# Patient Record
Sex: Female | Born: 1935 | Race: White | Hispanic: No | Smoking: Never smoker
Health system: Southern US, Community
[De-identification: ages and names within clinical notes are randomized; demographics above are authoritative.]

## PROBLEM LIST (undated history)

## (undated) DIAGNOSIS — C439 Malignant melanoma of skin, unspecified: Secondary | ICD-10-CM

## (undated) DIAGNOSIS — E876 Hypokalemia: Secondary | ICD-10-CM

## (undated) DIAGNOSIS — K82A1 Gangrene of gallbladder in cholecystitis: Secondary | ICD-10-CM

## (undated) DIAGNOSIS — M1990 Unspecified osteoarthritis, unspecified site: Secondary | ICD-10-CM

## (undated) DIAGNOSIS — S81802D Unspecified open wound, left lower leg, subsequent encounter: Secondary | ICD-10-CM

## (undated) DIAGNOSIS — K222 Esophageal obstruction: Secondary | ICD-10-CM

## (undated) DIAGNOSIS — M810 Age-related osteoporosis without current pathological fracture: Secondary | ICD-10-CM

## (undated) DIAGNOSIS — K5792 Diverticulitis of intestine, part unspecified, without perforation or abscess without bleeding: Secondary | ICD-10-CM

## (undated) DIAGNOSIS — E785 Hyperlipidemia, unspecified: Secondary | ICD-10-CM

## (undated) DIAGNOSIS — K219 Gastro-esophageal reflux disease without esophagitis: Secondary | ICD-10-CM

## (undated) DIAGNOSIS — E871 Hypo-osmolality and hyponatremia: Secondary | ICD-10-CM

## (undated) DIAGNOSIS — Z66 Do not resuscitate: Secondary | ICD-10-CM

## (undated) DIAGNOSIS — Z974 Presence of external hearing-aid: Secondary | ICD-10-CM

## (undated) DIAGNOSIS — D229 Melanocytic nevi, unspecified: Secondary | ICD-10-CM

## (undated) DIAGNOSIS — J302 Other seasonal allergic rhinitis: Secondary | ICD-10-CM

## (undated) DIAGNOSIS — I10 Essential (primary) hypertension: Secondary | ICD-10-CM

## (undated) HISTORY — DX: Presence of external hearing-aid: Z97.4

## (undated) HISTORY — DX: Esophageal obstruction: K22.2

## (undated) HISTORY — DX: Hypomagnesemia: E83.42

## (undated) HISTORY — DX: Melanocytic nevi, unspecified: D22.9

## (undated) HISTORY — DX: Unspecified open wound, left lower leg, subsequent encounter: S81.802D

## (undated) HISTORY — DX: Hypo-osmolality and hyponatremia: E87.1

## (undated) HISTORY — DX: Malignant melanoma of skin, unspecified: C43.9

## (undated) HISTORY — DX: Other seasonal allergic rhinitis: J30.2

## (undated) HISTORY — DX: Diverticulitis of intestine, part unspecified, without perforation or abscess without bleeding: K57.92

## (undated) HISTORY — DX: Unspecified osteoarthritis, unspecified site: M19.90

## (undated) HISTORY — DX: Gastro-esophageal reflux disease without esophagitis: K21.9

## (undated) HISTORY — DX: Essential (primary) hypertension: I10

## (undated) HISTORY — DX: Age-related osteoporosis without current pathological fracture: M81.0

## (undated) HISTORY — PX: CATARACT EXTRACTION: SUR2

## (undated) HISTORY — DX: Gangrene of gallbladder in cholecystitis: K82.A1

## (undated) HISTORY — DX: Do not resuscitate: Z66

## (undated) HISTORY — DX: Hyperlipidemia, unspecified: E78.5

## (undated) HISTORY — DX: Hypokalemia: E87.6

---
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
## 1972-05-24 HISTORY — PX: CARPAL TUNNEL RELEASE: SHX101

## 1997-10-14 ENCOUNTER — Other Ambulatory Visit: Admission: RE | Admit: 1997-10-14 | Discharge: 1997-10-14 | Payer: Self-pay | Admitting: Internal Medicine

## 1998-11-20 ENCOUNTER — Other Ambulatory Visit: Admission: RE | Admit: 1998-11-20 | Discharge: 1998-11-20 | Payer: Self-pay | Admitting: *Deleted

## 1999-05-25 HISTORY — PX: BUNIONECTOMY: SHX129

## 1999-11-17 ENCOUNTER — Encounter: Payer: Self-pay | Admitting: Family Medicine

## 1999-11-17 ENCOUNTER — Encounter: Admission: RE | Admit: 1999-11-17 | Discharge: 1999-11-17 | Payer: Self-pay | Admitting: Family Medicine

## 2000-05-09 ENCOUNTER — Other Ambulatory Visit: Admission: RE | Admit: 2000-05-09 | Discharge: 2000-05-09 | Payer: Self-pay | Admitting: Podiatry

## 2000-08-15 ENCOUNTER — Other Ambulatory Visit: Admission: RE | Admit: 2000-08-15 | Discharge: 2000-08-15 | Payer: Self-pay | Admitting: Internal Medicine

## 2000-09-29 ENCOUNTER — Other Ambulatory Visit: Admission: RE | Admit: 2000-09-29 | Discharge: 2000-09-29 | Payer: Self-pay | Admitting: Gynecology

## 2000-09-29 ENCOUNTER — Encounter (INDEPENDENT_AMBULATORY_CARE_PROVIDER_SITE_OTHER): Payer: Self-pay | Admitting: Specialist

## 2000-11-28 ENCOUNTER — Encounter: Payer: Self-pay | Admitting: Internal Medicine

## 2000-11-28 ENCOUNTER — Encounter: Admission: RE | Admit: 2000-11-28 | Discharge: 2000-11-28 | Payer: Self-pay | Admitting: Internal Medicine

## 2001-06-16 ENCOUNTER — Encounter: Admission: RE | Admit: 2001-06-16 | Discharge: 2001-06-16 | Payer: Self-pay | Admitting: Gynecology

## 2001-06-16 ENCOUNTER — Encounter: Payer: Self-pay | Admitting: Gynecology

## 2001-08-04 ENCOUNTER — Encounter: Payer: Self-pay | Admitting: Gastroenterology

## 2001-12-19 ENCOUNTER — Encounter: Admission: RE | Admit: 2001-12-19 | Discharge: 2001-12-19 | Payer: Self-pay | Admitting: Internal Medicine

## 2001-12-19 ENCOUNTER — Encounter: Payer: Self-pay | Admitting: Internal Medicine

## 2001-12-20 ENCOUNTER — Encounter: Payer: Self-pay | Admitting: Internal Medicine

## 2001-12-20 ENCOUNTER — Encounter: Admission: RE | Admit: 2001-12-20 | Discharge: 2001-12-20 | Payer: Self-pay | Admitting: Internal Medicine

## 2002-09-03 ENCOUNTER — Other Ambulatory Visit: Admission: RE | Admit: 2002-09-03 | Discharge: 2002-09-03 | Payer: Self-pay | Admitting: Gynecology

## 2002-12-24 ENCOUNTER — Encounter: Admission: RE | Admit: 2002-12-24 | Discharge: 2002-12-24 | Payer: Self-pay | Admitting: Internal Medicine

## 2002-12-24 ENCOUNTER — Encounter: Payer: Self-pay | Admitting: Internal Medicine

## 2003-12-24 ENCOUNTER — Encounter: Admission: RE | Admit: 2003-12-24 | Discharge: 2003-12-24 | Payer: Self-pay | Admitting: Internal Medicine

## 2005-01-12 ENCOUNTER — Encounter: Admission: RE | Admit: 2005-01-12 | Discharge: 2005-01-12 | Payer: Self-pay | Admitting: Specialist

## 2005-05-27 ENCOUNTER — Other Ambulatory Visit: Admission: RE | Admit: 2005-05-27 | Discharge: 2005-05-27 | Payer: Self-pay | Admitting: Gynecology

## 2006-01-14 ENCOUNTER — Encounter: Admission: RE | Admit: 2006-01-14 | Discharge: 2006-01-14 | Payer: Self-pay | Admitting: Specialist

## 2007-01-16 ENCOUNTER — Encounter: Admission: RE | Admit: 2007-01-16 | Discharge: 2007-01-16 | Payer: Self-pay | Admitting: Specialist

## 2007-04-24 ENCOUNTER — Ambulatory Visit (HOSPITAL_COMMUNITY): Admission: RE | Admit: 2007-04-24 | Discharge: 2007-04-25 | Payer: Self-pay | Admitting: Orthopedic Surgery

## 2008-01-17 ENCOUNTER — Encounter: Admission: RE | Admit: 2008-01-17 | Discharge: 2008-01-17 | Payer: Self-pay | Admitting: Specialist

## 2008-10-28 ENCOUNTER — Encounter: Payer: Self-pay | Admitting: Gastroenterology

## 2008-11-20 DIAGNOSIS — I10 Essential (primary) hypertension: Secondary | ICD-10-CM | POA: Insufficient documentation

## 2008-11-20 DIAGNOSIS — M8580 Other specified disorders of bone density and structure, unspecified site: Secondary | ICD-10-CM | POA: Insufficient documentation

## 2008-11-20 DIAGNOSIS — K5730 Diverticulosis of large intestine without perforation or abscess without bleeding: Secondary | ICD-10-CM | POA: Insufficient documentation

## 2008-11-20 DIAGNOSIS — R1310 Dysphagia, unspecified: Secondary | ICD-10-CM | POA: Insufficient documentation

## 2008-11-20 DIAGNOSIS — E7800 Pure hypercholesterolemia, unspecified: Secondary | ICD-10-CM | POA: Insufficient documentation

## 2008-11-26 ENCOUNTER — Ambulatory Visit: Payer: Self-pay | Admitting: Gastroenterology

## 2008-11-26 DIAGNOSIS — M1710 Unilateral primary osteoarthritis, unspecified knee: Secondary | ICD-10-CM | POA: Insufficient documentation

## 2008-11-26 DIAGNOSIS — M129 Arthropathy, unspecified: Secondary | ICD-10-CM | POA: Insufficient documentation

## 2008-12-11 ENCOUNTER — Ambulatory Visit: Payer: Self-pay | Admitting: Gastroenterology

## 2008-12-17 ENCOUNTER — Encounter: Payer: Self-pay | Admitting: Gastroenterology

## 2008-12-17 ENCOUNTER — Ambulatory Visit: Payer: Self-pay | Admitting: Gastroenterology

## 2008-12-18 ENCOUNTER — Telehealth: Payer: Self-pay | Admitting: Gastroenterology

## 2008-12-19 ENCOUNTER — Telehealth: Payer: Self-pay | Admitting: Gastroenterology

## 2008-12-19 ENCOUNTER — Encounter: Payer: Self-pay | Admitting: Gastroenterology

## 2008-12-19 DIAGNOSIS — K222 Esophageal obstruction: Secondary | ICD-10-CM | POA: Insufficient documentation

## 2008-12-30 ENCOUNTER — Telehealth: Payer: Self-pay | Admitting: Gastroenterology

## 2009-01-17 ENCOUNTER — Encounter: Admission: RE | Admit: 2009-01-17 | Discharge: 2009-01-17 | Payer: Self-pay | Admitting: Specialist

## 2009-01-20 ENCOUNTER — Telehealth: Payer: Self-pay | Admitting: Gastroenterology

## 2009-01-21 ENCOUNTER — Ambulatory Visit: Payer: Self-pay | Admitting: Gastroenterology

## 2009-01-21 ENCOUNTER — Encounter: Payer: Self-pay | Admitting: Gastroenterology

## 2009-01-21 ENCOUNTER — Ambulatory Visit (HOSPITAL_COMMUNITY): Admission: RE | Admit: 2009-01-21 | Discharge: 2009-01-21 | Payer: Self-pay | Admitting: Gastroenterology

## 2009-01-22 ENCOUNTER — Encounter: Payer: Self-pay | Admitting: Gastroenterology

## 2009-01-22 ENCOUNTER — Telehealth: Payer: Self-pay | Admitting: Gastroenterology

## 2009-02-06 ENCOUNTER — Ambulatory Visit (HOSPITAL_COMMUNITY): Admission: RE | Admit: 2009-02-06 | Discharge: 2009-02-06 | Payer: Self-pay | Admitting: Gastroenterology

## 2009-02-06 ENCOUNTER — Encounter: Payer: Self-pay | Admitting: Gastroenterology

## 2009-03-17 ENCOUNTER — Telehealth: Payer: Self-pay | Admitting: Gastroenterology

## 2009-06-11 ENCOUNTER — Encounter: Payer: Self-pay | Admitting: Gastroenterology

## 2009-06-18 ENCOUNTER — Encounter: Payer: Self-pay | Admitting: Gastroenterology

## 2009-06-19 ENCOUNTER — Telehealth: Payer: Self-pay | Admitting: Gastroenterology

## 2009-07-15 ENCOUNTER — Ambulatory Visit: Payer: Self-pay | Admitting: Gastroenterology

## 2009-07-15 DIAGNOSIS — K219 Gastro-esophageal reflux disease without esophagitis: Secondary | ICD-10-CM | POA: Insufficient documentation

## 2009-09-11 ENCOUNTER — Telehealth: Payer: Self-pay | Admitting: Gastroenterology

## 2010-01-19 ENCOUNTER — Encounter: Admission: RE | Admit: 2010-01-19 | Discharge: 2010-01-19 | Payer: Self-pay | Admitting: Specialist

## 2010-05-24 DIAGNOSIS — K222 Esophageal obstruction: Secondary | ICD-10-CM

## 2010-05-24 HISTORY — DX: Esophageal obstruction: K22.2

## 2010-06-25 NOTE — Progress Notes (Signed)
Summary: Pantoprazole prior authorization  Phone Note Call from Patient Call back at Home Phone [phone]   Caller: Patient Call For: Arlyce Dice Reason for Call: Talk to Nurse Summary of Call: Patient needs a prior auth for her Pantaprazole please call pharmacy at [phone] [facility] in [place] Initial call taken by: Tawni Levy,  December 18, 2008 10:48 AM  Follow-up for Phone Call        Called pt to inform just recieved the prior auth form. Explained to her it will take a couple of days for the insurance co to get back to [place]. She stated she has been on all OTC omeprazole and also Nexium she cant take because it makes her more sick. Follow-up by: Merri Ray CMA (AAMA),  December 18, 2008 1:32 PM  Additional Follow-up for Phone Call Additional follow up Details #1::        Spoke with [employer], they approved pts pantoprazole from today with no expiration date. Called pt and [facility] pharmacy to inform. Pts co-pay for that med is 22.00. pt informed Additional Follow-up by: Merri Ray CMA Duncan Dull),  December 19, 2008 9:56 AM

## 2010-06-25 NOTE — Progress Notes (Signed)
Summary: What is the name of medicine ordered for her?  Phone Note Call from Patient Call back at Home Phone [phone]   Call For: Dr Arlyce Dice Reason for Call: Talk to Nurse Summary of Call: [facility] has her medicine waiting for her and wants to know what is is? Is it Nexium? She cant take NExium is really sensitive to drugs. Wants  to know if she can just continue with the tums instead? Initial call taken by: [facility],  December 30, 2008 11:20 AM  Follow-up for Phone Call        Explained to pt that we sent her in Protonix and her insurance approved it. She stated she would try the med but prefers to continue  to take Tums on a daily basis. She stated she would try the Protonix if she didnt like it she would call us back to inform Follow-up by: Merri Ray CMA (AAMA),  December 30, 2008 1:12 PM

## 2010-06-25 NOTE — Procedures (Signed)
Summary: EGD   EGD  Procedure date:  12/17/2008  Findings:      Location: [facility]   ENDOSCOPY PROCEDURE REPORT  PATIENT:  Teresa Meyer, Teresa Meyer  MR#:  [id number] BIRTHDATE:   11-18-1922, 86 yrs. old   GENDER:   female  ENDOSCOPIST:   Barbette Hair. Arlyce Dice, MD Referred by:   PROCEDURE DATE:  12/17/2008 PROCEDURE:  EGD with biopsy, Maloney Dilation of Esophagus ASA CLASS:   Class II INDICATIONS: dysphagia   MEDICATIONS:    Fentanyl 50 mcg IV, Versed 6 mg IV, glycopyrrolate (Robinal) 0.2 mg IV, 0.6cc simethancone 0.6 cc PO TOPICAL ANESTHETIC:    DESCRIPTION OF PROCEDURE:   After the risks benefits and alternatives of the procedure were thoroughly explained, informed consent was obtained.  [facility] [id number] endoscope was introduced through the mouth and advanced to the third portion of the duodenum, without limitations.  The instrument was slowly withdrawn as the mucosa was fully examined. <<PROCEDUREIMAGES>>      <<OLD IMAGES>>  A stricture was found at the gastroesophageal junction. Moderately tight stricture offering resistance to the 10mm scope (see image1). Passage caused minor bleeding 12-46mm; moderate resistance  Mild gastritis was found in the antrum. ss With standard forceps, a biopsy was obtained and sent to pathology (see image3).  Duodenitis was found (see image2).  Esophagitis was found in the total esophagus (see image5). Moderate erythema    Retroflexed views revealed no abnormalities.    The scope was then withdrawn from the patient and the procedure completed.  COMPLICATIONS:   None  ENDOSCOPIC IMPRESSION:  1) Stricture at the gastroesophageal junction - s/p dilitation  2) Mild gastritis in the antrum  3) Duodenitis  4) Esophagitis in the total esophagus RECOMMENDATIONS:  1) Protonix 40 mg qd  2) balloon dilatation  in 3 weeks  REPEAT EXAM:   In 3 weeks  for EGD with balloon  dilatation.    cc: Dr. Casimiro Needle Blocker    _______________________________ Barbette Hair. Arlyce Dice, MD    CC:     REPORT OF SURGICAL PATHOLOGY   Case #: [id number] Patient Name: Teresa Meyer, Teresa Meyer. Office Chart Number:  [id number]   MRN: [id number] Pathologist: Alden Server A. Delila Spence, MD DOB/Age  Oct 12, 1922 (Age: 38)    Gender: F Date Taken:  12/17/2008 Date Received: 12/18/2008   FINAL DIAGNOSIS   ***MICROSCOPIC EXAMINATION AND DIAGNOSIS***   STOMACH:  BENIGN GASTRIC MUCOSA.  NO HELICOBACTER PYLORI, INTESTINAL METAPLASIA OR ACTIVE INFLAMMATION IDENTIFIED.   COMMENT A Warthin-Starry stain is performed to determine the possibility of the presence of Helicobacter pylori. The Warthin-Starry stain is negative for organisms of Helicobacter pylori. The control(s) stained appropriately.(EAA:mj 12/19/08)   mj Date Reported:  12/19/2008     Lyn Hollingshead. Delila Spence, MD *** Electronically Signed Out By EAA ***    December 19, 2008 MRN: [id number]    [facility] [address] [place], [place]  [address]    Dear Ms. Cannan,   Your biopsies demonstrated inflammatory changes only.    Please follow the recommendations previously discussed.  Should you have any immediate concerns or questions, feel free to contact me at the office.    Sincerely,  Barbette Hair. Arlyce Dice, M.D., [facility]          Sincerely,  Louis Meckel MD  This letter has been electronically signed by your physician.   Signed by Louis Meckel MD on 12/19/2008 at 12:52 PM  This report was created from the original endoscopy report, which was reviewed and  signed by the above listed endoscopist.

## 2010-06-25 NOTE — Progress Notes (Signed)
Summary: Switch to Nexium  ---- Converted from flag ---- ---- 06/18/2009 5:07 PM, Louis Meckel MD wrote: Please call pt and tell her I've called in nexium, which she should try once daily provided that it doesn't cause diarrhea.  She can try reducing dose to every other day or q3days if sxs remain under control.  She should return for an ov ------------------------------  Phone Note Outgoing Call   Call placed by: Laureen Ochs LPN,  June 19, 2009 9:12 AM Call placed to: Patient Summary of Call: Above MD orders reviewed with patient. OV scheduled for 07-15-09 at 2pm. Pt. instructed to call back as needed.  Initial call taken by: Laureen Ochs LPN,  June 19, 2009 9:18 AM

## 2010-06-25 NOTE — Miscellaneous (Signed)
  Clinical Lists Changes  Medications: Removed medication of PROTONIX 40 MG  TBEC (PANTOPRAZOLE SODIUM) 1 each day 30 minutes before meal Added new medication of NEXIUM 40 MG  CPDR (ESOMEPRAZOLE MAGNESIUM) 1 capsule each day 30 minutes before meal - Signed Rx of NEXIUM 40 MG  CPDR (ESOMEPRAZOLE MAGNESIUM) 1 capsule each day 30 minutes before meal;  #15 x 5;  Signed;  Entered by: Louis Meckel MD;  Authorized by: Louis Meckel MD;  Method used: Electronically to [facility] [phone]*, [address], [place], [place]  [id number], Ph: [phone], Fax: [phone]    Prescriptions: NEXIUM 40 MG  CPDR (ESOMEPRAZOLE MAGNESIUM) 1 capsule each day 30 minutes before meal  #15 x 5   Entered and Authorized by:   Louis Meckel MD   Signed by:   Louis Meckel MD on 06/18/2009   Method used:   Electronically to        [facility]. [address] [phone]* (retail)       [address] [place], [place]  [id number]       Ph: [phone]       Fax: [phone]   RxID:   [id number]

## 2010-06-25 NOTE — Procedures (Signed)
Summary: Colonoscopy   Colonoscopy  Procedure date:  08/04/2001  Findings:      Results: Diverticulosis.       Location:  [facility].   Patient Name: Teresa Meyer, Teresa Meyer MRN:  Procedure Procedures: Colorectal cancer screening, average risk CPT: G0121.  Personnel: Endoscopist: Barbette Hair. Arlyce Dice, MD.  Referred By: Marisue Brooklyn, D.O.  Indications  Average Risk Screening Routine.  History  Pre-Exam Physical: Performed Aug 04, 2001. Entire physical exam was normal.  Exam Exam: Extent of exam reached: Cecum, extent intended: Cecum.  The cecum was identified by IC valve. Colon retroflexion performed. ASA Classification: II. Tolerance: good.  Monitoring: Pulse and BP monitoring, Oximetry used. Supplemental O2 given. at 2 Liters.  Colon Prep Used Golytely for colon prep. Prep results: good.  Sedation Meds: Fentanyl 100 mcg. given IV. Versed 7 mg. given IV.  Findings DIVERTICULOSIS: Sigmoid Colon. ICD9: Diverticulosis: 562.10.  DIVERTICULOSIS: Sigmoid Colon. ICD9: Diverticulosis: 562.10.  DIVERTICULOSIS: Sigmoid Colon. ICD9: Diverticulosis: 562.10.   Assessment Abnormal examination, see findings above.  Diagnoses: 562.10: Diverticulosis.   Events  Unplanned Interventions: No intervention was required.  Unplanned Events: There were no complications. Plans Patient Education: Patient given standard instructions for: a normal exam.  Scheduling/Referral: Follow-Up prn.  This report was created from the original endoscopy report, which was reviewed and signed by the above listed endoscopist.    cc. Amy Stevenson,MD

## 2010-06-25 NOTE — Progress Notes (Signed)
Summary: TRIAGE  Phone Note Call from Patient   Caller: Patient Call For: Dr.Giovoni Bunch Summary of Call: Pt. picked-up her script, states she won't take Nexium because it causes her to have stomache cramps. Wants Dr.Brynnan Rodenbaugh to prescribe something else. [facility] PLEASE ADVISE  Initial call taken by: Laureen Ochs LPN,  June 19, 2009 3:11 PM  Follow-up for Phone Call        Per Dr.Annell Canty--Trial pt. on a few PPI's, she is to callback with an update on which one works best, then she can get a script for that one.  Above MD orders reviewed with patient. She will come for samples of Achipex, Prevacid OTC and Dexilant. Pt. instructed to call back as needed.  Follow-up by: Laureen Ochs LPN,  June 20, 2009 9:40 AM  Additional Follow-up for Phone Call Additional follow up Details #1::            /

## 2010-06-25 NOTE — Assessment & Plan Note (Signed)
Summary: F/U--SWITCHED FROM PROTONIX TO NEXIUM           Teresa Meyer   History of Present Illness Visit Type: Follow-up Visit Primary GI MD: Melvia Heaps MD [facility] Primary Provider: Orson Aloe, MD Requesting Provider: n/a Chief Complaint: F/u from starting prevacid. Pt said that she never took nexium and stopped taking prevacid  History of Present Illness:   [name] has returned for followup of her esophageal stricture.  She has undergone sequential dilatation of a distal esophageal stricture.  She has no further dysphagia.  She has occasional postprandial pyrosis for which he takes [place].  PPIs generally  cause some GI upset.   GI Review of Systems      Denies abdominal pain, acid reflux, belching, bloating, chest pain, dysphagia with liquids, dysphagia with solids, heartburn, loss of appetite, nausea, vomiting, vomiting blood, weight loss, and  weight gain.        Denies anal fissure, black tarry stools, change in bowel habit, constipation, diarrhea, diverticulosis, fecal incontinence, heme positive stool, hemorrhoids, irritable bowel syndrome, jaundice, light color stool, liver problems, rectal bleeding, and  rectal pain.    Current Medications (verified): 1)  Actonel 35 Mg Tabs (Risedronate Sodium) .Marland Kitchen.. 1 By Mouth Once Weekly 2)  Hydrochlorothiazide 25 Mg Tabs (Hydrochlorothiazide) .... 1/2 By Mouth Once Daily 3)  Vitamin B-12 1000 Mcg Tabs (Cyanocobalamin) .Marland Kitchen.. 1 By Mouth Once Daily 4)  Aspirin 81 Mg Tbec (Aspirin) .Marland Kitchen.. 1 By Mouth Twice A Week  Allergies (verified): 1)  ! Novocain 2)  ! Codeine  Past History:  Past Medical History: Reviewed history from 11/26/2008 and no changes required. OTHER DYSPHAGIA (ICD-787.29) OSTEOPENIA (ICD-733.90) HYPERCHOLESTEROLEMIA (ICD-272.0) ESSENTIAL HYPERTENSION (ICD-401.9) DIVERTICULOSIS, COLON (ICD-562.10) Arthritis  Past Surgical History: Reviewed history from 11/26/2008 and no changes required. D&C x2 for Vaginal  bleeding Laminectomy Hammer Toe Surgery  Family History: Reviewed history from 11/26/2008 and no changes required. No FH of Colon Cancer: Family History of Pancreatic Cancer: Son Family History of Lung Cancer: Mother  Social History: Reviewed history from 11/26/2008 and no changes required. Widowed, 1 boy, 1 girl Retired Patient has never smoked.  Alcohol Use - yes 1.5 glasses wine Daily Caffeine Use 1 glass tea Illicit Drug Use - no  Review of Systems       The patient complains of arthritis/joint pain.  The patient denies allergy/sinus, anemia, anxiety-new, back pain, blood in urine, breast changes/lumps, change in vision, confusion, cough, coughing up blood, depression-new, fainting, fatigue, fever, headaches-new, hearing problems, heart murmur, heart rhythm changes, itching, menstrual pain, muscle pains/cramps, night sweats, nosebleeds, pregnancy symptoms, shortness of breath, skin rash, sleeping problems, sore throat, swelling of feet/legs, swollen lymph glands, thirst - excessive , urination - excessive , urination changes/pain, urine leakage, vision changes, and voice change.    Vital Signs:  Patient profile:   77 year old female Height:      62 inches Weight:      139 pounds BSA:     1.64 Pulse rate:   72 / minute Pulse rhythm:   regular BP sitting:   126 / 82  (left arm) Cuff size:   regular  Vitals Entered By: Ok Anis CMA (July 15, 2009 2:01 PM)   Impression & Recommendations:  Problem # 1:  ESOPHAGEAL STRICTURE (ICD-530.3) Assessment Improved Repeat dilatations p.r.n.  Problem # 2:  GERD (ICD-530.81) Symptoms are well controlled with p.r.n. antacids.  She does not tolerate PPI therapy that well.She was instructed to use Prevacid as needed if  her pyrosis gets worse.  Patient Instructions: 1)  CC Dr. Casimiro Needle Blocker

## 2010-06-25 NOTE — Progress Notes (Signed)
Summary: rx mailed  Phone Note Call from Patient Call back at Home Phone [phone]   Caller: Patient Call For: Arlyce Dice Reason for Call: Talk to Nurse Summary of Call: Patient was given samples of Aciphex and they worked, would like an rx mailed to her. Initial call taken by: Tawni Levy,  September 11, 2009 11:43 AM  Follow-up for Phone Call        Will mail today Follow-up by: Merri Ray CMA Duncan Dull),  September 11, 2009 1:44 PM    New/Updated Medications: ACIPHEX 20 MG TBEC (RABEPRAZOLE SODIUM) 1 by mouth once daily Prescriptions: ACIPHEX 20 MG TBEC (RABEPRAZOLE SODIUM) 1 by mouth once daily  #90 x 3   Entered by:   Merri Ray CMA (AAMA)   Authorized by:   Louis Meckel MD   Signed by:   Merri Ray CMA (AAMA) on 09/11/2009   Method used:   Print then Give to Patient   RxID:   [id number]

## 2010-06-25 NOTE — Progress Notes (Signed)
Summary: ENDO/BALLOON DIL. SCHEDULED  Phone Note Outgoing Call   Call placed by: Laureen Ochs LPN,  January 22, 2009 9:52 AM Call placed to: Patient Summary of Call: Pt. for repeat Endo/balloon dil. scheduled at [facility] for 02-06-09 at 8am. All instructions reviewed with pt. by phone and mailed to her. Pt. instructed to call back as needed.  Initial call taken by: Laureen Ochs LPN,  January 22, 2009 9:52 AM

## 2010-06-25 NOTE — Procedures (Signed)
Summary: Instructions for procedure/[facility] (out pt)  Instructions for procedure/[facility] (out pt)   Imported By: Sherian Rein 01/24/2009 09:59:53  _____________________________________________________________________  External Attachment:    Type:   Image     Comment:   External Document

## 2010-06-25 NOTE — Progress Notes (Signed)
Summary: Endo/Balloon Dil. Scheduled  Phone Note Outgoing Call   Call placed by: Laureen Ochs LPN,  December 19, 2008 9:20 AM Call placed to: Patient Summary of Call: Pt. needs to be scheduled for an Endo/Balloon Dil. at [facility].  Pt. scheduled for 01-21-09 at 9am. All instructions reviewed w/pt. by phone and mailed to her. Pt. instructed to call back as needed.  Initial call taken by: Laureen Ochs LPN,  December 19, 2008 9:28 AM  New Problems: ESOPHAGEAL STRICTURE (ICD-530.3)   New Problems: ESOPHAGEAL STRICTURE (ICD-530.3)

## 2010-06-25 NOTE — Letter (Signed)
Summary: Results Letter  [facility]  [address] [place], [place] [address]   Phone: [phone]  Fax: [phone]        December 19, 2008 MRN: [id number]    [facility] [address] [place], [place]  [address]    Dear Ms. Saxe,   Your biopsies demonstrated inflammatory changes only.    Please follow the recommendations previously discussed.  Should you have any immediate concerns or questions, feel free to contact me at the office.    Sincerely,  Barbette Hair. Arlyce Dice, M.D., [facility]          Sincerely,  Louis Meckel MD  This letter has been electronically signed by your physician.

## 2010-06-25 NOTE — Procedures (Signed)
Summary: Instructions for procedure/[facility] (out pt)  Instructions for procedure/[facility] (out pt)   Imported By: Sherian Rein 12/23/2008 11:38:43  _____________________________________________________________________  External Attachment:    Type:   Image     Comment:   External Document

## 2010-06-25 NOTE — Progress Notes (Signed)
Summary: TRIAGE  Phone Note Call from Patient Call back at Home Phone [phone]   Caller: Patient Call For: Arlyce Dice Reason for Call: Talk to Nurse Summary of Call: Patient has questions regarding her rx Pantoprazole  Initial call taken by: Tawni Levy,  March 17, 2009 11:12 AM  Follow-up for Phone Call        Pt. began Pantoprazole on 02-06-09. She began with loose stools about 10 days later. Last week she decreased the Pantoprazole to 3 times a week. The loose stools have improved and her GERD is still under control. Wants to know if she can continue Pantoprazole just 3 X weekly.   [facility] PLEASE ADVISE  Follow-up by: Laureen Ochs LPN,  March 17, 2009 11:25 AM  Additional Follow-up for Phone Call Additional follow up Details #1::        yes Additional Follow-up by: Louis Meckel MD,  March 17, 2009 3:52 PM

## 2010-06-25 NOTE — Progress Notes (Signed)
Summary: procedure questions  Phone Note Call from Patient Call back at Home Phone [phone]   Caller: Patient Call For: Dr. Arlyce Dice Reason for Call: Talk to Nurse Details for Reason: procedure questions Summary of Call: pt has questions regarding her hospital procedure tomorrow Initial call taken by: Vallarie Mare,  January 20, 2009 9:59 AM  Follow-up for Phone Call        For Endo/Balloon Dil. at [facility] on 01-21-09 at 9am. All questions answered. Pt. instructed to call back as needed.  Follow-up by: Laureen Ochs LPN,  January 20, 2009 10:13 AM

## 2010-06-25 NOTE — Procedures (Signed)
Summary: EGD/BALLOON DIL.   EGD  Procedure date:  02/06/2009  Findings:      Location: [facility]   ENDOSCOPY PROCEDURE REPORT  PATIENT:  Teresa Meyer, Teresa Meyer  MR#:   [id number] BIRTHDATE:   10/15/1922, 86 yrs. old   GENDER:   female  ENDOSCOPIST:   Barbette Hair. Arlyce Dice, MD ASSISTANT:    PROCEDURE DATE:  02/06/2009 PROCEDURE:  EGD with balloon dilatation ASA CLASS:   Class II INDICATIONS: 1) dilation of esophageal stricture   MEDICATIONS:    Fentanyl 50 mcg, Versed 5 mg IV, glycopyrrolate (Robinal) 0.2 mg IV TOPICAL ANESTHETIC:   Cetacaine Spray  DESCRIPTION OF PROCEDURE:   After the risks benefits and alternatives of the procedure were thoroughly explained, informed consent was obtained.  The [id number] ([id number]) endoscope was introduced through the mouth and advanced to the third portion of the duodenum, without limitations.  The instrument was slowly withdrawn as the mucosa was carefully examined. <<PROCEDUREIMAGES>><<OLD IMAGES>>  A stricture was found at the gastroesophageal junction (see image001). Moderate stricture, improved from previous exam  A hiatal hernia was found. 3cm hiatal hernia  Duodenitis was found in the bulb of the duodenum. mild, patchy areas of erythema  The examination was otherwise normal.    Dilation was then performed at the gastroesphageal junction  1) Dilator:   Balloon   Size(s):   15-16.5-18 Resistance:   moderate   Heme:   none Appearance:   Moderate resistance to 18mm dilator  COMPLICATIONS:   None  ENDOSCOPIC IMPRESSION:  1) Stricture at the gastroesophageal junction - s/p dilitation  2) Hiatal hernia  3) Duodenitis in the bulb of duodenum  4) Otherwise normal examination. RECOMMENDATIONS:  1) dilatations PRN  2) continue PPI  REPEAT EXAM:   No  cc: Dr. Casimiro Needle Blocker   _______________________________ Barbette Hair. Arlyce Dice, MD   CC:

## 2010-06-25 NOTE — Letter (Signed)
Summary: from patient/[facility]  from patient/[facility]   Imported By: Lester [place] 06/24/2009 10:28:02  _____________________________________________________________________  External Attachment:    Type:   Image     Comment:   External Document

## 2010-06-25 NOTE — Procedures (Signed)
Summary: EGD/BALLOON DIL.    EGD  Procedure date:  01/21/2009  Findings:      Location: [facility]   ENDOSCOPY PROCEDURE REPORT  PATIENT:  Teresa Meyer, Teresa Meyer  MR#:  [id number] BIRTHDATE:   June 15, 1922, 86 yrs. old   GENDER:   female  ENDOSCOPIST:   Barbette Hair. Arlyce Dice, MD ASSISTANT:    PROCEDURE DATE:  01/21/2009 PROCEDURE:  EGD with biopsy, EGD with balloon dilatation ASA CLASS:   Class II INDICATIONS: 1) dysphagia   MEDICATIONS:    Fentanyl 75 mcg, Versed 7 mg IV, glycopyrrolate (Robinal) 0.2 mg TOPICAL ANESTHETIC:    DESCRIPTION OF PROCEDURE:   After the risks benefits and alternatives of the procedure were thoroughly explained, informed consent was obtained.  The EG-[id number] ([id number] endoscope was introduced through the mouth and advanced to the third portion of the duodenum, without limitations.  The instrument was slowly withdrawn as the mucosa was carefully examined. <<PROCEDUREIMAGES>>  <<OLD IMAGES>>  A stricture was found at the gastroesophageal junction (see image001). Moderately tight stricture  Duodenitis was found in the bulb and descending duodenum (see image003). Moderate edema and erythema  The examination was otherwise normal.    Dilation was then performed at the gastroesphageal junction  1) Dilator:   Balloon   Size(s):   12-13.5-15 Resistance:   moderate   Heme:   yes Appearance:     COMPLICATIONS:   None  ENDOSCOPIC IMPRESSION:  1) Stricture at the gastroesophageal junction - s/p dilitation  2) Duodenitis in the bulb/descending duodenum  3) Otherwise normal examination. RECOMMENDATIONS:  1) continue PPI 2) repeat dilitaton in 2-3 weeks  REPEAT EXAM:   In 3 weeks for Dilatation.  cc: Dr. Casimiro Needle Blocker   _______________________________ Barbette Hair. Arlyce Dice, MD   CC:     Appended Document: EGD/BALLOON DIL.  Repeat procedure scheduled at [facility] on 02-06-09 at 8am.

## 2010-10-06 NOTE — Op Note (Signed)
Teresa Meyer, Teresa Meyer                 ACCOUNT NO.:  [id number]   MEDICAL RECORD NO.:  [id number]          PATIENT TYPE:  AMB   LOCATION:  DAY                          FACILITY:  [facility]   PHYSICIAN:  Marlowe Kays, M.D.  DATE OF BIRTH:  07/03/1922   DATE OF PROCEDURE:  04/24/2007  DATE OF DISCHARGE:                               OPERATIVE REPORT   PREOPERATIVE DIAGNOSIS:  Complex right toe deformity.   POSTOPERATIVE DIAGNOSIS:  Complex right toe deformity.   PROCEDURE:  Correction of complex right toe deformity with (1) fusion of  DIP joint with local bone graft, (2) resection of base of proximal  phalanx, (3) pin fixation, second toe.   SURGEON:  Marlowe Kays, M.D.   ASSISTANT:  Nurse.   ANESTHESIA:  General.   JUSTIFICATION FOR PROCEDURE:  She has a history of having had two foot  surgeries consisting of a bunion surgery and some surgery on her second  toe.  This has left her with a deformity with a marked valgus deformity  of the DIP joint at almost a 90-degree angle lateral-ward and clawing  and varus deformity at the proximal portion of the toe at the MP joint  with the proximal phalanx riding on the medial side of the second  metatarsal head.  I thought that the above surgical procedure would be  the best to correct her problems.   DESCRIPTION OF PROCEDURE:  Under satisfactory anesthesia, pneumatic  tourniquet applied, and right lower extremity Esmarch'ed out  nonsterilely, with tourniquet at 275 mmHg.  Right leg was prepped with  DuraPrep from midcalf to toes and draped in sterile field.  Timeout  performed.   I first drew out the incision for the DIP fusion starting medially and  distally adjacent to the toenail and then working proximally in a lazy S  fashion over the DIP joint going proximally and lateral-ward.  I then  dissected down to the DIP joint and with a small osteotome carefully  resected the base of the distal phalanx.  I took care to try and  minimize  any injury to the growth line for the nail.  Proximally, I cut  squared-off fashion with small bone cutter the distal portion of the  middle phalanx so that the two raw bone edges would approximately meet  in now a corrected straight position.  I then went through the old  surgical incision at the base of the proximal phalanx, exposing the  proximal phalanx through its metaphyseal and metaphyseal flare positions  and the MP joint.  Undermining the proximal phalanx with __________  elevator, I then placed small baby Homan's, protecting the underlying  soft tissues and with the micro saw cut the proximal phalanx at the  flare and then removed it with towel clip and sharp dissection.  I was  then able to correct the proximal phalanx deformity on the second  metatarsal head.  I then selected a 0.045 smooth K-wire and first tried  to place it by going distally through the distal phalanx and then into  the middle phalanx and found that it  was difficult for me to line up the  pin in appropriate fashion.  Accordingly, I went ahead and started  antegrade through the resected base of the proximal phalanx, coming out  at the DIP fusion area and then lining up the distal phalanx onto the  pin and then going through the distal phalanx with the joint in a  corrected position.  I then tried to bring the bone edges as close  together as possible by placing a Therapist, nutritional on the base of the  resected portion of the proximal phalanx and compacting distally.  I  then supplemented this with a small amount of cancellus bone taken from  the resected base of the proximal phalanx at the DIP fusion site.  Before cutting the pin, I released the tourniquet and found that the toe  pinked up nicely.  The pin was then bent, cut, and capped and closure  performed.  I first injected the base of the toe with 0.5% plain  Marcaine.  The distal incision closed with a 4-0 nylon.  On the proximal  incision, I used 3-0 Vicryl  in the extensor mechanism and 4-0 nylon in  the skin.  Betadine and Adaptic dry sterile dressing were applied,  protecting the pin.   She tolerated the procedure well and was taken to recovery in  satisfactory condition, with no known complications.           ______________________________  Marlowe Kays, M.D.     JA/MEDQ  D:  04/24/2007  T:  04/24/2007  Job:  [id number]

## 2010-10-16 ENCOUNTER — Other Ambulatory Visit: Payer: Self-pay | Admitting: Dermatology

## 2011-02-01 ENCOUNTER — Other Ambulatory Visit: Payer: Self-pay | Admitting: Specialist

## 2011-02-01 DIAGNOSIS — Z1231 Encounter for screening mammogram for malignant neoplasm of breast: Secondary | ICD-10-CM

## 2011-02-05 ENCOUNTER — Other Ambulatory Visit: Payer: Self-pay | Admitting: Dermatology

## 2011-02-11 ENCOUNTER — Ambulatory Visit
Admission: RE | Admit: 2011-02-11 | Discharge: 2011-02-11 | Disposition: A | Discharge status: Home / Self Care | Payer: Medicare Other | Source: Ambulatory Visit | Attending: Specialist | Admitting: Specialist

## 2011-02-11 DIAGNOSIS — Z1231 Encounter for screening mammogram for malignant neoplasm of breast: Secondary | ICD-10-CM

## 2011-03-02 LAB — BASIC METABOLIC PANEL
BUN: 9
CO2: 26
Calcium: 9.9
Chloride: 104
Creatinine, Ser: 0.77
GFR calc Af Amer: >60
GFR calc non Af Amer: >60
Glucose, Bld: 111 — ABNORMAL HIGH
Potassium: 4.1
Sodium: 141

## 2011-03-02 LAB — HEMOGLOBIN AND HEMATOCRIT, BLOOD
HCT: 39.5
Hemoglobin: 13.7

## 2011-03-11 ENCOUNTER — Ambulatory Visit: Payer: Self-pay | Admitting: Ophthalmology

## 2011-03-22 ENCOUNTER — Ambulatory Visit: Payer: Self-pay | Admitting: Ophthalmology

## 2011-04-02 ENCOUNTER — Ambulatory Visit: Payer: Self-pay | Admitting: Ophthalmology

## 2011-04-07 ENCOUNTER — Ambulatory Visit: Payer: Self-pay | Admitting: Ophthalmology

## 2011-11-19 DIAGNOSIS — I10 Essential (primary) hypertension: Secondary | ICD-10-CM | POA: Diagnosis present

## 2011-11-19 DIAGNOSIS — M810 Age-related osteoporosis without current pathological fracture: Secondary | ICD-10-CM | POA: Insufficient documentation

## 2011-11-19 DIAGNOSIS — E7800 Pure hypercholesterolemia, unspecified: Secondary | ICD-10-CM | POA: Diagnosis present

## 2012-01-04 ENCOUNTER — Other Ambulatory Visit: Payer: Self-pay | Admitting: Specialist

## 2012-01-04 DIAGNOSIS — Z1231 Encounter for screening mammogram for malignant neoplasm of breast: Secondary | ICD-10-CM

## 2012-02-17 ENCOUNTER — Ambulatory Visit: Payer: Medicare Other

## 2012-02-21 ENCOUNTER — Ambulatory Visit
Admission: RE | Admit: 2012-02-21 | Discharge: 2012-02-21 | Disposition: A | Discharge status: Home / Self Care | Payer: Medicare Other | Source: Ambulatory Visit | Attending: Specialist | Admitting: Specialist

## 2012-02-21 DIAGNOSIS — Z1231 Encounter for screening mammogram for malignant neoplasm of breast: Secondary | ICD-10-CM

## 2012-03-20 LAB — COMPREHENSIVE METABOLIC PANEL
Creat: 0.86
Glucose: 93
Potassium: 3.8 mmol/L

## 2012-03-20 LAB — CBC
HGB: 13 g/dL
WBC: 5.2
platelet count: 239

## 2012-03-20 LAB — VITAMIN D 1,25 DIHYDROXY: Vitamin D 1, 25 (OH)2 Total: 60.4

## 2012-04-05 ENCOUNTER — Other Ambulatory Visit: Payer: Self-pay | Admitting: Dermatology

## 2012-04-27 ENCOUNTER — Other Ambulatory Visit: Payer: Self-pay | Admitting: Dermatology

## 2012-06-01 ENCOUNTER — Other Ambulatory Visit: Payer: Self-pay | Admitting: Dermatology

## 2013-01-30 ENCOUNTER — Other Ambulatory Visit: Payer: Self-pay

## 2013-01-30 DIAGNOSIS — Z1231 Encounter for screening mammogram for malignant neoplasm of breast: Secondary | ICD-10-CM

## 2013-02-22 ENCOUNTER — Ambulatory Visit
Admission: RE | Admit: 2013-02-22 | Discharge: 2013-02-22 | Disposition: A | Discharge status: Home / Self Care | Payer: Medicare Other | Source: Ambulatory Visit

## 2013-02-22 DIAGNOSIS — Z1231 Encounter for screening mammogram for malignant neoplasm of breast: Secondary | ICD-10-CM

## 2013-02-22 LAB — HM MAMMOGRAPHY

## 2013-02-26 ENCOUNTER — Encounter: Payer: Self-pay | Admitting: *Deleted

## 2013-03-15 ENCOUNTER — Telehealth: Payer: Self-pay | Admitting: Family Medicine

## 2013-03-15 MED ORDER — HYDROCHLOROTHIAZIDE 25 MG PO TABS: 25.0000 mg | ORAL_TABLET | Freq: Every day | ORAL | Status: DC

## 2013-03-15 MED ORDER — HYDROCHLOROTHIAZIDE 12.5 MG PO CAPS: 12.5000 mg | ORAL_CAPSULE | Freq: Every day | ORAL | Status: DC

## 2013-03-15 NOTE — Telephone Encounter (Signed)
plz notify 1 mo supply sent in for her. I sent this to [id number] - [place], [place] - [address] plz verify correct [facility] ([facility] was not in our system)

## 2013-03-15 NOTE — Telephone Encounter (Signed)
Spoke with patient and she said that was the correct pharmacy.

## 2013-03-15 NOTE — Telephone Encounter (Signed)
Teresa Meyer is a new patient that I spoke with you about this morning. Her other doctor moved to [place] and she cant find out any information on where he is to get her medication. She will need a rx for Hydrochlorothiazide 25 mg. She takes 1 a day. She uses a mail order but said we could call in a month supply to [facility] on [address]  In [place]. [address]. Their phone # is [phone]

## 2013-03-19 ENCOUNTER — Ambulatory Visit: Payer: Medicare Other | Admitting: Family Medicine

## 2013-03-27 ENCOUNTER — Ambulatory Visit (INDEPENDENT_AMBULATORY_CARE_PROVIDER_SITE_OTHER): Payer: Medicare Other | Admitting: Family Medicine

## 2013-03-27 ENCOUNTER — Encounter: Payer: Self-pay | Admitting: Family Medicine

## 2013-03-27 VITALS — BP 144/86 | HR 84 | Temp 98.1°F | Ht 62.0 in | Wt 121.5 lb

## 2013-03-27 DIAGNOSIS — I10 Essential (primary) hypertension: Secondary | ICD-10-CM

## 2013-03-27 DIAGNOSIS — K219 Gastro-esophageal reflux disease without esophagitis: Secondary | ICD-10-CM

## 2013-03-27 DIAGNOSIS — Z23 Encounter for immunization: Secondary | ICD-10-CM

## 2013-03-27 DIAGNOSIS — E7800 Pure hypercholesterolemia, unspecified: Secondary | ICD-10-CM

## 2013-03-27 DIAGNOSIS — M899 Disorder of bone, unspecified: Secondary | ICD-10-CM

## 2013-03-27 MED ORDER — HYDROCHLOROTHIAZIDE 25 MG PO TABS: 25.0000 mg | ORAL_TABLET | Freq: Every day | ORAL | Status: DC

## 2013-03-27 NOTE — Assessment & Plan Note (Signed)
will review records. Pt will continue vit D and stop actonel when she runs out of script - 2/2 concern for dysphagia with this.

## 2013-03-27 NOTE — Assessment & Plan Note (Signed)
Chronic, very stable on HCTZ 25mg  daily.

## 2013-03-27 NOTE — Assessment & Plan Note (Signed)
Pt off PPI, uses tums prn.  Will monitor for now.

## 2013-03-27 NOTE — Progress Notes (Signed)
Subjective:    Patient ID: Teresa Meyer, female    DOB: June 04, 1922, 79 y.o.   MRN: [id number]  HPI CC: new pt to establish  Prior PCP was Dr. Leavy Cella (saw him since 10/25/2002). Widower 10-24-2004 (husband died of lung cancer and COPD).  Osteoporosis - on actonel several years.  Interested in stopping after she runs out.  Had discussed stopping bisphosphonate with PCP because of concern for contributing .  Last dexa - unsure "I don't think I've ever had".    HTN - chronic, stable on hydrochlorothiazide 25mg .   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 Flu shot - today Tetanus 2004-10-24 Mammogram 10/25/11  Medications and allergies reviewed and updated in chart.  Past histories reviewed and updated if relevant as below. Patient Active Problem List   Diagnosis Date Noted  . GERD 07/15/2009  . ESOPHAGEAL STRICTURE 12/19/2008  . ARTHRITIS 11/26/2008  . HYPERCHOLESTEROLEMIA 11/20/2008  . ESSENTIAL HYPERTENSION 11/20/2008  . DIVERTICULOSIS, COLON 11/20/2008  . OSTEOPENIA 11/20/2008  . OTHER DYSPHAGIA 11/20/2008   Past Medical History  Diagnosis Date  . HTN (hypertension)   . HLD (hyperlipidemia)     did not tolerate statin  . Diverticulitis   . Arthritis   . Seasonal allergies   . Osteoporosis   . GERD (gastroesophageal reflux disease)   . Esophageal stricture 2010/10/25  . Melanoma   . Wears hearing aid    Past Surgical History  Procedure Laterality Date  . Tonsillectomy  1928  . Carpal tunnel release  1974    bilateral  . Bunionectomy Right 1999-10-25  . Cataract extraction Left     [place]   History  Substance Use Topics  . Smoking status: Never Smoker   . Smokeless tobacco: Never Used  .  Alcohol Use: Yes     Comment: 1 glass wine nightly   Family History  Problem Relation Age of Onset  . Cancer Son     pancreas  . Cancer Sister     breast  . Cancer Mother     lung  . Stroke Neg Hx   . CAD Neg Hx   . Diabetes Other     nephew   Allergies  Allergen Reactions  . Codeine   . Sulfa Antibiotics     Can't recall reaction  . Procaine Hcl Palpitations   Current Outpatient Prescriptions on File Prior to Visit  Medication Sig Dispense Refill  . hydrochlorothiazide (HYDRODIURIL) 25 MG tablet Take 1 tablet (25 mg total) by mouth daily.  30 tablet  1   No current facility-administered medications on file prior to visit.     Review of Systems  Constitutional: Negative for fever, chills, activity change, appetite change, fatigue and unexpected weight change.  HENT: Negative for hearing loss.   Eyes: Negative for visual disturbance.  Respiratory: Negative for cough, chest tightness, shortness of breath and wheezing.   Cardiovascular: Negative for chest pain, palpitations and leg swelling.  Gastrointestinal: Negative for nausea, vomiting, abdominal pain, diarrhea, constipation, blood in stool and abdominal distention.  Genitourinary: Negative for hematuria and difficulty urinating.  Musculoskeletal: Negative for arthralgias, myalgias and neck pain.  Skin: Negative for rash.  Neurological: Positive for dizziness ("off kilter"). Negative for seizures, syncope and  headaches.  Hematological: Negative for adenopathy. Bruises/bleeds easily.  Psychiatric/Behavioral: Negative for dysphoric mood. The patient is not nervous/anxious.        Objective:   Physical Exam  Nursing note and vitals reviewed. Constitutional: She is oriented to person, place, and time. She appears well-developed and well-nourished. No distress.  HENT:  Head: Normocephalic and atraumatic.  Mouth/Throat: Oropharynx is clear and moist. No oropharyngeal exudate.  Eyes: Conjunctivae and EOM are normal.  Pupils are equal, round, and reactive to light. No scleral icterus.  anisocoria  Neck: Normal range of motion. Neck supple. Carotid bruit is not present. No thyromegaly present.  Cardiovascular: Normal rate, regular rhythm and intact distal pulses.   Murmur (2/6 late SEM) heard. Pulses:      Radial pulses are 2+ on the right side, and 2+ on the left side.  Pulmonary/Chest: Effort normal and breath sounds normal. No respiratory distress. She has no wheezes. She has no rales.  Musculoskeletal: Normal range of motion. She exhibits no edema.  Lymphadenopathy:    She has no cervical adenopathy.  Neurological: She is alert and oriented to person, place, and time.  CN grossly intact, station and gait intact  Skin: Skin is warm and dry. No rash noted.  Psychiatric: She has a normal mood and affect. Her behavior is normal. Judgment and thought content normal.       Assessment & Plan:

## 2013-03-27 NOTE — Assessment & Plan Note (Signed)
Review old records - check FLP next fasting blood work

## 2013-03-27 NOTE — Patient Instructions (Addendum)
Flu shot today. Continue meds as up to now - may stop actonel when you finish prescription. Return at your convenience in 6 months for follow up Good to meet you today, call us with questions.

## 2013-03-27 NOTE — Addendum Note (Signed)
Addended by: Josph Macho A on: 03/27/2013 03:10 PM   Modules accepted: Orders

## 2013-04-09 ENCOUNTER — Encounter: Payer: Self-pay | Admitting: Family Medicine

## 2013-04-10 ENCOUNTER — Telehealth: Payer: Self-pay

## 2013-04-10 NOTE — Telephone Encounter (Signed)
Pt left v/m wanting to ck on refill of her med 2 weeks ago; pt did not leave name of med. Left v/m for pt to cb with name of med she is checking on.

## 2013-04-11 ENCOUNTER — Encounter: Payer: Self-pay | Admitting: *Deleted

## 2013-04-12 ENCOUNTER — Other Ambulatory Visit: Payer: Self-pay

## 2013-04-12 MED ORDER — HYDROCHLOROTHIAZIDE 25 MG PO TABS: 25.0000 mg | ORAL_TABLET | Freq: Every day | ORAL | Status: DC

## 2013-04-12 NOTE — Telephone Encounter (Signed)
[facility]; pt checked paper work with [facility] on 03/27/13 visit and cannot find rx. Advised [facility].

## 2013-04-12 NOTE — Telephone Encounter (Signed)
The patient came in and was hoping to get a refill of her hydrochlorothiazide 25mg  [facility].  Thanks!

## 2013-04-12 NOTE — Telephone Encounter (Signed)
[facility] was [facility]illed [facility] as requested.

## 2013-09-16 ENCOUNTER — Other Ambulatory Visit: Payer: Self-pay | Admitting: Family Medicine

## 2013-09-16 DIAGNOSIS — E7800 Pure hypercholesterolemia, unspecified: Secondary | ICD-10-CM

## 2013-09-16 DIAGNOSIS — I10 Essential (primary) hypertension: Secondary | ICD-10-CM

## 2013-09-17 ENCOUNTER — Other Ambulatory Visit (INDEPENDENT_AMBULATORY_CARE_PROVIDER_SITE_OTHER): Payer: Medicare Other

## 2013-09-17 DIAGNOSIS — E7800 Pure hypercholesterolemia, unspecified: Secondary | ICD-10-CM

## 2013-09-17 DIAGNOSIS — I10 Essential (primary) hypertension: Secondary | ICD-10-CM

## 2013-09-17 LAB — BASIC METABOLIC PANEL
BUN: 17 mg/dL (ref 6–23)
CO2: 28 mEq/L (ref 19–32)
Calcium: 9.5 mg/dL (ref 8.4–10.5)
Chloride: 100 mEq/L (ref 96–112)
Creatinine, Ser: 0.9 mg/dL (ref 0.4–1.2)
GFR: 60.29 mL/min (ref >60.00)
Glucose, Bld: 96 mg/dL (ref 70–99)
Potassium: 3.9 mEq/L (ref 3.5–5.1)
Sodium: 137 mEq/L (ref 135–145)

## 2013-09-17 LAB — LIPID PANEL
Cholesterol: 271 mg/dL — ABNORMAL HIGH (ref 0–200)
HDL: 49.1 mg/dL (ref >39.00)
LDL Cholesterol: 200 mg/dL — ABNORMAL HIGH (ref 0–99)
Total CHOL/HDL Ratio: 6
Triglycerides: 110 mg/dL (ref 0.0–149.0)
VLDL: 22 mg/dL (ref 0.0–40.0)

## 2013-09-17 LAB — TSH: TSH: 5.24 u[IU]/mL (ref 0.35–5.50)

## 2013-09-24 ENCOUNTER — Ambulatory Visit (INDEPENDENT_AMBULATORY_CARE_PROVIDER_SITE_OTHER): Payer: Medicare Other | Admitting: Family Medicine

## 2013-09-24 ENCOUNTER — Encounter: Payer: Self-pay | Admitting: Family Medicine

## 2013-09-24 VITALS — BP 144/88 | HR 72 | Temp 98.1°F | Ht 62.0 in | Wt 134.5 lb

## 2013-09-24 DIAGNOSIS — E7800 Pure hypercholesterolemia, unspecified: Secondary | ICD-10-CM

## 2013-09-24 DIAGNOSIS — Z0000 Encounter for general adult medical examination without abnormal findings: Secondary | ICD-10-CM | POA: Insufficient documentation

## 2013-09-24 DIAGNOSIS — M949 Disorder of cartilage, unspecified: Secondary | ICD-10-CM

## 2013-09-24 DIAGNOSIS — Z66 Do not resuscitate: Secondary | ICD-10-CM

## 2013-09-24 DIAGNOSIS — M899 Disorder of bone, unspecified: Secondary | ICD-10-CM

## 2013-09-24 DIAGNOSIS — I10 Essential (primary) hypertension: Secondary | ICD-10-CM

## 2013-09-24 NOTE — Assessment & Plan Note (Signed)
I have personally reviewed the Medicare Annual Wellness questionnaire and have noted 1. The patient's medical and social history 2. Their use of alcohol, tobacco or illicit drugs 3. Their current medications and supplements 4. The patient's functional ability including ADL's, fall risks, home safety risks and hearing or visual impairment. 5. Diet and physical activity 6. Evidence for depression or mood disorders The patients weight, height, BMI have been recorded in the chart.  Hearing and vision has been addressed. I have made referrals, counseling and provided education to the patient based review of the above and I have provided the pt with a written personalized care plan for preventive services. See scanned questionairre. Advanced directives discussed: pt will bring me copy of advanced directive.  Daughter Teresa Meyer is HCPOA.  Reviewed preventative protocols and updated unless pt declined.  Pt will schedule mammogram towards end of year.   Aged out of other preventative measures.

## 2013-09-24 NOTE — Assessment & Plan Note (Signed)
Chronic, stable. Continue hctz.  

## 2013-09-24 NOTE — Assessment & Plan Note (Signed)
Recommended rpt DEXA. Off actonel (on 35mg  weekly for last 5 years)

## 2013-09-24 NOTE — Assessment & Plan Note (Signed)
MOST form filled out. Pt is clear about not currently wanting IVF or feeding tube, but states would consider change when the time comes.  ok with ABX. MOST form copied for our chart, pink form sent home with patient.

## 2013-09-24 NOTE — Patient Instructions (Addendum)
Pass by Rosaria Ferries or Linda's office to schedule bone density scan. MOST form filled out today. I think you are doing well!  conitnue weight bearing exercises for bone strength/health Good to see you today, call us with questions.

## 2013-09-24 NOTE — Assessment & Plan Note (Signed)
Elevated readings.  Encouraged she start RYR 1 pill daily.   Pt hesitant for more meds.

## 2013-09-24 NOTE — Progress Notes (Signed)
Pre visit review using our clinic review tool, if applicable. No additional management support is needed unless otherwise documented below in the visit note. 

## 2013-09-24 NOTE — Progress Notes (Addendum)
BP 144/88  Pulse 72  Temp(Src) 98.1 F (36.7 C) (Oral)  Ht 5\' 2"  (1.575 m)  Wt 134 lb 8 oz (61.009 kg)  BMI 24.59 kg/m2   CC: medicare wellness exam  Subjective:    Patient ID: Teresa Meyer, female    DOB: 1923-03-12, 80 y.o.   MRN: [id number]  HPI: Teresa Meyer is a 80 y.o. female presenting on 09/24/2013 for Annual Exam   Did not wear hearing aides today. Vision exam deferred - states had vision checed 2 mo ago. Denies falls , depression or anhedonia.  Osteoporosis - on actonel several years. Interested in stopping after she runs out. Had discussed stopping bisphosphonate with PCP because of concern for contributing to dysphagia. Last dexa - 1980s.  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  Denies blood in stool Breast cancer screening - desires continued yearly mammogram.  Last 02/2013. Cervical cancer screening - aged out Flu shot - 03/2013 Tetanus 2006  Pneumonia shot - declines zostavax - declines.  Never had chicken pox Dexa - done 1980s.  I recommended rpt. Advanced directives - has at home.  Daughter Jerlyn Ly is HCPOA  Has DNR.   Relevant past medical, surgical, family and social history reviewed and updated as indicated.  Allergies and medications reviewed and updated. Current Outpatient Prescriptions on File Prior to Visit  Medication Sig  . aspirin (ASPIRIN EC) 81 MG EC tablet Take 81 mg by mouth once a week. Swallow whole.  . cholecalciferol (VITAMIN D) 1000 UNITS tablet Take 1,000 Units by mouth daily.  . hydrochlorothiazide (HYDRODIURIL) 25 MG tablet Take 1 tablet (25 mg total) by mouth daily.  . vitamin B-12 (CYANOCOBALAMIN) 1000 MCG  tablet Take 1,000 mcg by mouth daily.   No current facility-administered medications on file prior to visit.    Review of Systems Per HPI unless specifically indicated above    Objective:    BP 144/88  Pulse 72  Temp(Src) 98.1 F (36.7 C) (Oral)  Ht 5\' 2"  (1.575 m)  Wt 134 lb 8 oz (61.009 kg)  BMI 24.59 kg/m2  Physical Exam  Nursing note and vitals reviewed. Constitutional: She is oriented to person, place, and time. She appears well-developed and well-nourished. No distress.  HENT:  Head: Normocephalic and atraumatic.  Right Ear: Hearing, tympanic membrane, external ear and ear canal normal.  Left Ear: Hearing, tympanic membrane, external ear and ear canal normal.  Nose: Nose normal.  Mouth/Throat: Uvula is midline, oropharynx is clear and moist and mucous membranes are normal. No oropharyngeal exudate, posterior oropharyngeal edema or posterior oropharyngeal erythema.  Eyes: Conjunctivae and EOM are normal. No scleral icterus.  Anisocoria - L pupil deformed  Neck: Normal range of motion. Neck supple. Carotid bruit is not present. No thyromegaly present.  Cardiovascular: Normal rate, regular rhythm, normal heart sounds and intact distal pulses.   No murmur heard. Pulses:      Radial pulses are 2+ on the right side, and 2+ on the left side.  Pulmonary/Chest: Effort normal and breath sounds normal. No respiratory distress. She has no wheezes. She has no rales.  Abdominal: Soft. Bowel sounds are normal. She exhibits no distension  and no mass. There is no tenderness. There is no rebound and no guarding.  Musculoskeletal: Normal range of motion. She exhibits no edema.  Lymphadenopathy:    She has no cervical adenopathy.  Neurological: She is alert and oriented to person, place, and time.  CN grossly intact, station and gait intact Recall 3/3 Calculation 5/5 serial 7s  Skin: Skin is warm and dry. No rash noted.  Psychiatric: She has a normal mood and affect. Her behavior is  normal. Judgment and thought content normal.   Results for orders placed in visit on 09/17/13  LIPID PANEL      Result Value Ref Range   Cholesterol 271 (*) 0 - 200 mg/dL   Triglycerides 110.0  0.0 - 149.0 mg/dL   HDL 49.10  >39.00 mg/dL   VLDL 22.0  0.0 - 40.0 mg/dL   LDL Cholesterol 200 (*) 0 - 99 mg/dL   Total CHOL/HDL Ratio 6    TSH      Result Value Ref Range   TSH 5.24  0.35 - 5.50 uIU/mL  BASIC METABOLIC PANEL      Result Value Ref Range   Sodium 137  135 - 145 mEq/L   Potassium 3.9  3.5 - 5.1 mEq/L   Chloride 100  96 - 112 mEq/L   CO2 28  19 - 32 mEq/L   Glucose, Bld 96  70 - 99 mg/dL   BUN 17  6 - 23 mg/dL   Creatinine, Ser 0.9  0.4 - 1.2 mg/dL   Calcium 9.5  8.4 - 10.5 mg/dL   GFR 60.29  >60.00 mL/min      Assessment & Plan:   Problem List Items Addressed This Visit   HYPERCHOLESTEROLEMIA     Elevated readings.  Encouraged she start RYR 1 pill daily.   Pt hesitant for more meds.    ESSENTIAL HYPERTENSION     Chronic, stable. Continue hctz.    OSTEOPENIA     Recommended rpt DEXA. Off actonel (on 35mg  weekly for last 5 years)    Relevant Orders      DG Bone Density   DNR (do not resuscitate)     MOST form filled out. Pt is clear about not currently wanting IVF or feeding tube, but states would consider change when the time comes.  ok with ABX. MOST form copied for our chart, pink form sent home with patient.    Medicare annual wellness visit, subsequent - Primary     I have personally reviewed the Medicare Annual Wellness questionnaire and have noted 1. The patient's medical and social history 2. Their use of alcohol, tobacco or illicit drugs 3. Their current medications and supplements 4. The patient's functional ability including ADL's, fall risks, home safety risks and hearing or visual impairment. 5. Diet and physical activity 6. Evidence for depression or mood disorders The patients weight, height, BMI have been recorded in the chart.  Hearing and  vision has been addressed. I have made referrals, counseling and provided education to the patient based review of the above and I have provided the pt with a written personalized care plan for preventive services. See scanned questionairre. Advanced directives discussed: pt will bring me copy of advanced directive.  Daughter [name] is HCPOA.  Reviewed preventative protocols and updated unless pt declined.  Pt will schedule mammogram towards end of year.   Aged out of other preventative measures.        Follow up plan: Return as needed, for annual  exam, prior fasting for blood work.

## 2013-09-25 ENCOUNTER — Telehealth: Payer: Self-pay | Admitting: Family Medicine

## 2013-09-25 NOTE — Telephone Encounter (Signed)
Relevant patient education assigned to patient using Emmi. ° °

## 2013-10-07 ENCOUNTER — Encounter: Payer: Self-pay | Admitting: Family Medicine

## 2013-10-16 ENCOUNTER — Other Ambulatory Visit: Payer: Medicare Other

## 2013-11-05 ENCOUNTER — Encounter: Payer: Self-pay | Admitting: Family Medicine

## 2014-01-22 ENCOUNTER — Other Ambulatory Visit: Payer: Self-pay

## 2014-01-22 DIAGNOSIS — Z1231 Encounter for screening mammogram for malignant neoplasm of breast: Secondary | ICD-10-CM

## 2014-02-26 ENCOUNTER — Ambulatory Visit
Admission: RE | Admit: 2014-02-26 | Discharge: 2014-02-26 | Disposition: A | Discharge status: Home / Self Care | Payer: Medicare Other | Source: Ambulatory Visit

## 2014-02-26 ENCOUNTER — Encounter (INDEPENDENT_AMBULATORY_CARE_PROVIDER_SITE_OTHER): Payer: Self-pay

## 2014-02-26 DIAGNOSIS — Z1231 Encounter for screening mammogram for malignant neoplasm of breast: Secondary | ICD-10-CM

## 2014-02-27 LAB — HM MAMMOGRAPHY: HM Mammogram: NORMAL

## 2014-02-28 ENCOUNTER — Encounter: Payer: Self-pay | Admitting: *Deleted

## 2014-02-28 ENCOUNTER — Other Ambulatory Visit: Payer: Self-pay | Admitting: Family Medicine

## 2014-03-07 ENCOUNTER — Other Ambulatory Visit: Payer: Self-pay | Admitting: Dermatology

## 2014-03-18 ENCOUNTER — Other Ambulatory Visit: Payer: Self-pay | Admitting: Dermatology

## 2014-03-23 ENCOUNTER — Encounter: Payer: Self-pay | Admitting: Family Medicine

## 2014-03-23 DIAGNOSIS — Z85820 Personal history of malignant melanoma of skin: Secondary | ICD-10-CM | POA: Insufficient documentation

## 2014-03-23 DIAGNOSIS — C439 Malignant melanoma of skin, unspecified: Secondary | ICD-10-CM | POA: Insufficient documentation

## 2014-05-07 ENCOUNTER — Ambulatory Visit (INDEPENDENT_AMBULATORY_CARE_PROVIDER_SITE_OTHER): Payer: Medicare Other | Admitting: Family Medicine

## 2014-05-07 ENCOUNTER — Encounter: Payer: Self-pay | Admitting: Family Medicine

## 2014-05-07 VITALS — BP 140/78 | HR 68 | Temp 98.2°F | Wt 132.5 lb

## 2014-05-07 DIAGNOSIS — I10 Essential (primary) hypertension: Secondary | ICD-10-CM

## 2014-05-07 DIAGNOSIS — E7800 Pure hypercholesterolemia, unspecified: Secondary | ICD-10-CM

## 2014-05-07 DIAGNOSIS — M8580 Other specified disorders of bone density and structure, unspecified site: Secondary | ICD-10-CM

## 2014-05-07 DIAGNOSIS — Z7189 Other specified counseling: Secondary | ICD-10-CM | POA: Insufficient documentation

## 2014-05-07 DIAGNOSIS — Z66 Do not resuscitate: Secondary | ICD-10-CM

## 2014-05-07 DIAGNOSIS — K219 Gastro-esophageal reflux disease without esophagitis: Secondary | ICD-10-CM

## 2014-05-07 NOTE — Assessment & Plan Note (Addendum)
Unclear if osteopenia or osteoporosis dx. remote DEXA.  Will discuss DEXA next visit. Continue tums and vit D regularly.

## 2014-05-07 NOTE — Addendum Note (Signed)
Addended by: [name] on: 05/07/2014 01:30 PM   Modules accepted: Medications

## 2014-05-07 NOTE — Patient Instructions (Signed)
You are doing well. Return in 6-8 months for medicare wellness visit

## 2014-05-07 NOTE — Assessment & Plan Note (Signed)
LDL 200 last check. Did not tolerate RYR, did not tolerate statin.  Declines further treatment which is reasonable. Would suggest not continuing to check FLP.

## 2014-05-07 NOTE — Assessment & Plan Note (Signed)
Reviewed with patient

## 2014-05-07 NOTE — Assessment & Plan Note (Signed)
Controlled on tums prn.

## 2014-05-07 NOTE — Assessment & Plan Note (Signed)
Chronic, stable. Continue hctz 25mg daily.  

## 2014-05-07 NOTE — Progress Notes (Signed)
Pre visit review using our clinic review tool, if applicable. No additional management support is needed unless otherwise documented below in the visit note. 

## 2014-05-07 NOTE — Progress Notes (Signed)
BP 140/78 mmHg  Pulse 68  Temp(Src) 98.2 F (36.8 C) (Oral)  Wt 132 lb 8 oz (60.102 kg)   CC: 6 mo f/u  Subjective:    Patient ID: Teresa Meyer, female    DOB: [birth year], 80 y.o.   MRN: [id number]  HPI: Teresa Meyer is a 80 y.o. female presenting on 05/07/2014 for Follow-up   Lives alone at [facility]. Husband deceased from COPD.   Osteoporosis/osteopenia - on actonel several years. Last dexa - 1980s. Bisphosphonate previously may have caused dysphagia. Declines further treatment. Takes tums regularly. Also compliant with vit D. Stays [facility] with [facility] machines [facility]. Also swims regularly.  GERD with stricture s/p endoscopy - 2012 (Dr Deatra Ina). Diet controlled, and tums prn.   HLD - LDL 200. Did not tolerate RYR, did not tolerate statin.   HTN - compliant with hctz 25mg  daily. No HA, vision changes, CP/tightness, SOB, leg swelling.  Significant arthritis throughout, esp L knee, takes aleve prn for this, followed by Dr Lake Bells.  Relevant past medical, surgical, family and social history reviewed and updated as indicated. Interim medical history since our last visit reviewed. Allergies and medications reviewed and updated.  Current Outpatient Prescriptions on File Prior to Visit  Medication Sig  . aspirin (ASPIRIN EC) 81 MG EC tablet Take 81 mg by mouth once a week. Swallow whole.  . cholecalciferol (VITAMIN D) 1000 UNITS tablet Take 1,000 Units by mouth daily.  . hydrochlorothiazide (HYDRODIURIL) 25 MG tablet TAKE 1 TABLET DAILY  . prednisoLONE acetate (PRED MILD) 0.12 % ophthalmic suspension Place 1 drop into the left eye 2 (two) times daily.  . vitamin B-12 (CYANOCOBALAMIN) 1000 MCG tablet Take 1,000 mcg by mouth daily.   No current facility-administered medications on file prior to visit.    Review of Systems Per HPI unless specifically indicated above     Objective:    BP 140/78 mmHg  Pulse 68  Temp(Src) 98.2 F (36.8 C) (Oral)  Wt 132 lb 8 oz (60.102 kg)    Wt Readings from Last 3 Encounters:  05/07/14 132 lb 8 oz (60.102 kg)  09/24/13 134 lb 8 oz (61.009 kg)  03/27/13 121 lb 8 oz (55.112 kg)    Physical Exam  Constitutional: She appears well-developed and well-nourished. No distress.  HENT:  Mouth/Throat: Oropharynx is clear and moist. No oropharyngeal exudate.  Cardiovascular: Normal rate, regular rhythm, normal heart sounds and intact distal pulses.   No murmur heard. Pulmonary/Chest: Effort normal and breath sounds normal. No respiratory distress. She has no wheezes. She has no rales.  Musculoskeletal: She exhibits no edema.  Skin: Skin is warm and dry. No rash noted.  Psychiatric: She has a normal mood and affect.  Nursing note and vitals reviewed.  Results for orders placed or performed in visit on 02/28/14  HM MAMMOGRAPHY  Result Value Ref Range   HM Mammogram Normal Birads 1-Repeat 1 year       Assessment & Plan:   Problem List Items Addressed This Visit    Osteopenia    Unclear if osteopenia or osteoporosis dx. remote DEXA.  Will discuss DEXA next visit. Continue tums and vit D regularly.    HYPERCHOLESTEROLEMIA    LDL 200 last check. Did not tolerate RYR, did not tolerate statin.  Declines further treatment which is reasonable. Would suggest not continuing to check FLP.    GERD    Controlled on tums prn.    Essential hypertension - Primary  Chronic, stable. Continue hctz 25mg  daily.    DNR (do not resuscitate)    Reviewed with patient.        Follow up plan: Return in about 8 months (around 01/06/2015), or as needed, for medicare wellness.

## 2014-07-20 ENCOUNTER — Other Ambulatory Visit: Payer: Self-pay | Admitting: Family Medicine

## 2014-09-01 DIAGNOSIS — M1712 Unilateral primary osteoarthritis, left knee: Secondary | ICD-10-CM | POA: Insufficient documentation

## 2014-09-22 DIAGNOSIS — D229 Melanocytic nevi, unspecified: Secondary | ICD-10-CM

## 2014-09-22 HISTORY — DX: Melanocytic nevi, unspecified: D22.9

## 2014-09-27 ENCOUNTER — Encounter: Payer: Self-pay | Admitting: Gastroenterology

## 2014-10-25 ENCOUNTER — Encounter: Payer: Self-pay | Admitting: Family Medicine

## 2014-11-05 ENCOUNTER — Encounter: Payer: Self-pay | Admitting: Family Medicine

## 2015-01-10 ENCOUNTER — Ambulatory Visit (INDEPENDENT_AMBULATORY_CARE_PROVIDER_SITE_OTHER): Payer: Medicare Other | Admitting: Family Medicine

## 2015-01-10 ENCOUNTER — Encounter: Payer: Self-pay | Admitting: Family Medicine

## 2015-01-10 VITALS — BP 130/66 | HR 82 | Temp 98.1°F | Ht 60.5 in | Wt 126.8 lb

## 2015-01-10 DIAGNOSIS — R1310 Dysphagia, unspecified: Secondary | ICD-10-CM | POA: Diagnosis not present

## 2015-01-10 DIAGNOSIS — R011 Cardiac murmur, unspecified: Secondary | ICD-10-CM

## 2015-01-10 DIAGNOSIS — Z7189 Other specified counseling: Secondary | ICD-10-CM

## 2015-01-10 DIAGNOSIS — Z66 Do not resuscitate: Secondary | ICD-10-CM

## 2015-01-10 DIAGNOSIS — Z0000 Encounter for general adult medical examination without abnormal findings: Secondary | ICD-10-CM

## 2015-01-10 DIAGNOSIS — M8580 Other specified disorders of bone density and structure, unspecified site: Secondary | ICD-10-CM

## 2015-01-10 DIAGNOSIS — K219 Gastro-esophageal reflux disease without esophagitis: Secondary | ICD-10-CM

## 2015-01-10 DIAGNOSIS — I10 Essential (primary) hypertension: Secondary | ICD-10-CM

## 2015-01-10 DIAGNOSIS — E7800 Pure hypercholesterolemia, unspecified: Secondary | ICD-10-CM

## 2015-01-10 NOTE — Assessment & Plan Note (Addendum)
Chronic, stable on hctz 25mg  daily. Check labs next year.

## 2015-01-10 NOTE — Patient Instructions (Addendum)
Start prilosec OTC 20mg  daily for 3 weeks then as needed for indigestion.  Let me know if weight continues to drop. Take tylenol 500mg  once to twice during the day for arthritis pain. We will check mammogram every 2 years - next due 02/2016.  Good to see you today, call us with questions. Return as needed or in 1 year for next medicare wellness visit.

## 2015-01-10 NOTE — Assessment & Plan Note (Signed)
Persistent - suggested trial of OTC prilosec 20mg  daily for 3 wks.  If persistent trouble, consider referral back to GI for EGD. Weight loss noted, pt not concerned

## 2015-01-10 NOTE — Assessment & Plan Note (Signed)
Continue calcium/vit D. Pt declines DEXA.

## 2015-01-10 NOTE — Assessment & Plan Note (Addendum)
Advanced directives - in chart. Daughter Jerlyn Ly is HCPOA. DNR updated today.

## 2015-01-10 NOTE — Addendum Note (Signed)
Addended by: [name] on: 01/10/2015 01:13 PM   Modules accepted: Miquel Dunn

## 2015-01-10 NOTE — Assessment & Plan Note (Signed)
Pt not interested in statin. Has not tolerated RYR in past. Declines further eval. Will not recheck.

## 2015-01-10 NOTE — Progress Notes (Signed)
Pre visit review using our clinic review tool, if applicable. No additional management support is needed unless otherwise documented below in the visit note. 

## 2015-01-10 NOTE — Progress Notes (Addendum)
BP 130/66 mmHg  Pulse 82  Temp(Src) 98.1 F (36.7 C) (Oral)  Ht 5' 0.5" (1.537 m)  Wt 126 lb 12 oz (57.493 kg)  BMI 24.34 kg/m2  SpO2 98%   CC: medicare wellness visit  Subjective:    Patient ID: Teresa Meyer, female    DOB: May 20, 1923, 81 y.o.   MRN: [id number]  HPI: Teresa Meyer is a 81 y.o. female presenting on 01/10/2015 for Annual Exam   Noticing more trouble with indigestion. Thinks age related. Noticing more hoarseness for last 3 weeks. 6lb weight loss noted. Notices NSAIDs worsen GERD. Does better with tylenol. Takes 1-2 tums daily. Mild dysphagia present but rarely.  H/o GERD with stricture s/p endoscopy - 2012 (Dr Deatra Ina).   Noticing worse trouble with L shoulder and upper arm, mild on right side. Takes 2 tylenol PMs prior to bedtime.  Recent cryotherapy with resultant blisters - sees derm regularly. Has had 5 melanomas removed over the years.   Family has asked her not to drive to [place].   Wears hearing aides. Vision exam at eye doctor 1 fall without injury in last year. Lost balance at home.  Denies depression or anhedonia.   Preventative: Colon cancer screening - aged out. Denies blood in stool Breast cancer screening - normal 02/2014. Requests Q2 yr screening Cervical cancer screening - aged out Flu shot - yearly Tetanus 2006  Pneumonia shot - declines zostavax - declines. Never had chicken pox Dexa - done 1980s. I recommended rpt. Advanced directives - in chart. Daughter Jerlyn Ly is HCPOA. DNR updated today.  Lives alone at [place] - husband died of lung cancer  Daughter in [place], daughter in [place] (moving to [place]) Occ: travel agency, retired  Edu: college  Activity: 1 mile walking, swimming, Charity fundraiser  Diet: seldom water, fruits/vegetables daily  Relevant past medical, surgical, family and social history reviewed and updated as indicated. Interim medical history since our last visit  reviewed. Allergies and medications reviewed and updated. Current Outpatient Prescriptions on File Prior to Visit  Medication Sig  . aspirin (ASPIRIN EC) 81 MG EC tablet Take 81 mg by mouth once a week. Swallow whole.  . calcium carbonate (TUMS - DOSED IN MG ELEMENTAL CALCIUM) 500 MG chewable tablet Chew 1 tablet by mouth 2 (two) times daily with a meal.  . cholecalciferol (VITAMIN D) 1000 UNITS tablet Take 1,000 Units by mouth daily.  . hydrochlorothiazide (HYDRODIURIL) 25 MG tablet TAKE 1 TABLET DAILY  . prednisoLONE acetate (PRED MILD) 0.12 % ophthalmic suspension Place 1 drop into the left eye 2 (two) times daily.  . vitamin B-12 (CYANOCOBALAMIN) 1000 MCG tablet Take 1,000 mcg by mouth daily.   No current facility-administered medications on file prior to visit.    Review of Systems Per HPI unless specifically indicated above     Objective:    BP 130/66 mmHg  Pulse 82  Temp(Src) 98.1 F (36.7 C) (Oral)  Ht 5' 0.5" (1.537 m)  Wt 126 lb 12 oz (57.493 kg)  BMI 24.34 kg/m2  SpO2 98%  Wt Readings from Last 3 Encounters:  01/10/15 126 lb 12 oz (57.493 kg)  05/07/14 132 lb 8 oz (60.102 kg)  09/24/13 134 lb 8 oz (61.009 kg)    Physical Exam  Constitutional: She is oriented to person, place, and time. She appears well-developed and well-nourished. No distress.  HENT:  Head: Normocephalic and atraumatic.  Right Ear: Decreased hearing is noted.  Left Ear: Decreased hearing  is noted.  Nose: Nose normal.  Mouth/Throat: Uvula is midline, oropharynx is clear and moist and mucous membranes are normal. No oropharyngeal exudate, posterior oropharyngeal edema or posterior oropharyngeal erythema.  Hearing aides in place  Eyes: Conjunctivae and EOM are normal. No scleral icterus.  anisocoria  Neck: Normal range of motion. Neck supple. Carotid bruit is not present. No thyromegaly present.  Cardiovascular: Normal rate, regular rhythm and intact distal pulses.   Murmur (4/6 mid systolic  best lower sternal border) heard. Pulses:      Radial pulses are 2+ on the right side, and 2+ on the left side.  Pulmonary/Chest: Effort normal and breath sounds normal. No respiratory distress. She has no wheezes. She has no rales.  Breast - declined  Abdominal: Soft. Bowel sounds are normal. She exhibits no distension and no mass. There is no tenderness. There is no rebound and no guarding.  Musculoskeletal: Normal range of motion. She exhibits no edema.  Lymphadenopathy:    She has no cervical adenopathy.  Neurological: She is alert and oriented to person, place, and time.  CN grossly intact, station and gait intact Recall 3/3 Calculation 4/5 serial 3s  Skin: Skin is warm and dry. No rash noted.  Psychiatric: She has a normal mood and affect. Her behavior is normal. Judgment and thought content normal.  Nursing note and vitals reviewed.  Results for orders placed or performed in visit on 02/28/14  HM MAMMOGRAPHY  Result Value Ref Range   HM Mammogram Normal Birads 1-Repeat 1 year       Assessment & Plan:   Problem List Items Addressed This Visit    HYPERCHOLESTEROLEMIA    Pt not interested in statin. Has not tolerated RYR in past. Declines further eval. Will not recheck.      Essential hypertension    Chronic, stable on hctz 25mg  daily. Check labs next year.      GERD    Persistent - suggested trial of OTC prilosec 20mg  daily for 3 wks.  If persistent trouble, consider referral back to GI for EGD. Weight loss noted, pt not concerned      Relevant Medications   omeprazole (PRILOSEC OTC) 20 MG tablet   Osteopenia    Continue calcium/vit D. Pt declines DEXA.       Dysphagia    Denies current issue.      DNR (do not resuscitate)    Again reviewed with patient.      Medicare annual wellness visit, subsequent - Primary    I have personally reviewed the Medicare Annual Wellness questionnaire and have noted 1. The patient's medical and social history 2. Their use  of alcohol, tobacco or illicit drugs 3. Their current medications and supplements 4. The patient's functional ability including ADL's, fall risks, home safety risks and hearing or visual impairment. Cognitive function has been assessed and addressed as indicated.  5. Diet and physical activity 6. Evidence for depression or mood disorders The patients weight, height, BMI have been recorded in the chart. I have made referrals, counseling and provided education to the patient based on review of the above and I have provided the pt with a written personalized care plan for preventive services. Provider list updated.. See scanned questionairre as needed for further documentation. Reviewed preventative protocols and updated unless pt declined.       Advanced care planning/counseling discussion    Advanced directives - in chart. Daughter Jerlyn Ly is HCPOA. DNR updated today.      Systolic  murmur    Chronic, pt asxs. Continue to monitor.          Follow up plan: Return in about 1 year (around 01/10/2016), or as needed, for annual exam, prior fasting for blood work.

## 2015-01-10 NOTE — Assessment & Plan Note (Signed)
Chronic, pt asxs. Continue to monitor.

## 2015-01-10 NOTE — Assessment & Plan Note (Signed)

## 2015-01-10 NOTE — Assessment & Plan Note (Signed)
Denies current issue.

## 2015-01-10 NOTE — Assessment & Plan Note (Signed)
Again reviewed with patient

## 2015-04-16 ENCOUNTER — Other Ambulatory Visit: Payer: Self-pay | Admitting: Family Medicine

## 2015-08-19 ENCOUNTER — Encounter: Payer: Self-pay | Admitting: Family Medicine

## 2015-10-11 ENCOUNTER — Other Ambulatory Visit: Payer: Self-pay | Admitting: Family Medicine

## 2015-10-27 ENCOUNTER — Telehealth: Payer: Self-pay | Admitting: Family Medicine

## 2015-10-27 NOTE — Telephone Encounter (Signed)
Patient Name: [name] DOB: 07/25/1922 Initial Comment Caller states her left leg looks swelled. Nurse Assessment Nurse: Vallery Sa, RN, Cathy Date/Time (Eastern Time): 10/27/2015 2:23:48 PM Confirm and document reason for call. If symptomatic, describe symptoms. You must click the next button to save text entered. ---[name] states she developed swelling in her left lower leg today. No pain. No new injury in the past 3 days. No fever. No severe breathing difficulty. No chest pain. Alert and responsive. Has the patient traveled out of the country within the last 30 days? ---No Does the patient have any new or worsening symptoms? ---Yes Will a triage be completed? ---Yes Related visit to physician within the last 2 weeks? ---No Does the PT have any chronic conditions? (i.e. diabetes, asthma, etc.) ---Yes List chronic conditions. ---Knee problems, High Blood Pressure Is this a behavioral health or substance abuse call? ---No Guidelines Guideline Title Affirmed Question Affirmed Notes Leg Swelling and Edema [1] Thigh, calf, or ankle swelling AND [2] only 1 side Final Disposition User See Physician within 4 Hours (or PCP triage) Vallery Sa, RN, Cathy Comments Caller declined an appointment at another office or to go to an urgent care facility. She asks if Dr. Danise Mina can see her tomorrow. Called the office backline twice and their phonelines are not working. Jeny notified that a faxed report will be sent to the office and that she should call back in 30 minutes if she doesn't hear back from the office. Supervisor, [employer], notified. Referrals GO TO FACILITY REFUSED Disagree/Comply: Disagree Disagree/Comply Reason: Disagree with instructions

## 2015-10-27 NOTE — Telephone Encounter (Signed)
I spoke with pt and she scheduled appt with [name] on 10/28/15 at 8:15 AM. Pt has no pain but swelling below left knee. Pt will keep legs up but if develops pain, more swelling or SOB pt will go to ED prior to appt. Pt voiced understanding.

## 2015-10-28 ENCOUNTER — Encounter: Payer: Self-pay | Admitting: Family Medicine

## 2015-10-28 ENCOUNTER — Ambulatory Visit (INDEPENDENT_AMBULATORY_CARE_PROVIDER_SITE_OTHER): Payer: Medicare Other | Admitting: Family Medicine

## 2015-10-28 VITALS — BP 120/80 | HR 77 | Temp 97.6°F | Wt 129.8 lb

## 2015-10-28 DIAGNOSIS — R600 Localized edema: Secondary | ICD-10-CM | POA: Insufficient documentation

## 2015-10-28 NOTE — Progress Notes (Signed)
Pre visit review using our clinic review tool, if applicable. No additional management support is needed unless otherwise documented below in the visit note. 

## 2015-10-28 NOTE — Patient Instructions (Signed)
Good to see you today. I think swelling was from combination of knee brace and leg wound. Continue elevation of legs as well as avoiding too much salt in your diet.

## 2015-10-28 NOTE — Assessment & Plan Note (Signed)
No significant pedal edema today - anticipate L dependent pedal edema noted at home was due largely to compression from knee sleeve + recent abrasion to L calf. Reassured. Update if persistent or worsening swelling.

## 2015-10-28 NOTE — Progress Notes (Signed)
BP 120/80 mmHg  Pulse 77  Temp(Src) 97.6 F (36.4 C)  Wt 129 lb 12.8 oz (58.877 kg)  SpO2 97%   CC: left leg swelling  Subjective:    Patient ID: Teresa Meyer, female    DOB: 1922-09-11, 82 y.o.   MRN: [id number]  HPI: Teresa Meyer is a 82 y.o. female presenting on 10/28/2015 for Leg Swelling   2-3d h/o dependent L leg swelling. No pain, redness, warmth of leg. No leg cramping. Known significant L knee OA, uses ACE sleeve brace on left knee regularly. May be contributing. Denies chest pain or dyspnea.   Hasn't tried anything for this yet.  No increased salt intake recently. Staying well hydrated with fluids.   Relevant past medical, surgical, family and social history reviewed and updated as indicated. Interim medical history since our last visit reviewed. Allergies and medications reviewed and updated. Current Outpatient Prescriptions on File Prior to Visit  Medication Sig  . acetaminophen (TYLENOL) 500 MG tablet Take 500 mg by mouth daily as needed.  Marland Kitchen aspirin (ASPIRIN EC) 81 MG EC tablet Take 81 mg by mouth once a week. Swallow whole.  . calcium carbonate (TUMS - DOSED IN MG ELEMENTAL CALCIUM) 500 MG chewable tablet Chew 1 tablet by mouth 2 (two) times daily with a meal.  . cholecalciferol (VITAMIN D) 1000 UNITS tablet Take 1,000 Units by mouth daily.  . diphenhydramine-acetaminophen (TYLENOL PM) 25-500 MG TABS Take 2 tablets by mouth at bedtime as needed.  . hydrochlorothiazide (HYDRODIURIL) 25 MG tablet TAKE 1 TABLET DAILY  . omeprazole (PRILOSEC OTC) 20 MG tablet Take 20 mg by mouth daily.  . prednisoLONE acetate (PRED MILD) 0.12 % ophthalmic suspension Place 1 drop into the left eye 2 (two) times daily.  . vitamin B-12 (CYANOCOBALAMIN) 1000 MCG tablet Take 1,000 mcg by mouth daily.   No current facility-administered medications on file prior to visit.    Review of Systems Per HPI unless specifically indicated in ROS section     Objective:    BP 120/80 mmHg  Pulse  77  Temp(Src) 97.6 F (36.4 C)  Wt 129 lb 12.8 oz (58.877 kg)  SpO2 97%  Wt Readings from Last 3 Encounters:  10/28/15 129 lb 12.8 oz (58.877 kg)  01/10/15 126 lb 12 oz (57.493 kg)  05/07/14 132 lb 8 oz (60.102 kg)    Physical Exam  Constitutional: She is oriented to person, place, and time. She appears well-developed and well-nourished. No distress.  Cardiovascular: Normal rate, regular rhythm, normal heart sounds and intact distal pulses.   No murmur heard. Pulmonary/Chest: Effort normal and breath sounds normal. No respiratory distress. She has no wheezes. She has no rales.  Musculoskeletal: She exhibits no edema.  2+ DP bilaterally No significant pedal edema today  Neurological: She is alert and oriented to person, place, and time.  Skin: Skin is warm and dry. No rash noted.  Scab at site of old abrasion L calf  Psychiatric: She has a normal mood and affect.  Nursing note and vitals reviewed.  Results for orders placed or performed in visit on 02/28/14  HM MAMMOGRAPHY  Result Value Ref Range   HM Mammogram Normal Birads 1-Repeat 1 year    Lab Results  Component Value Date   K 3.9 09/17/2013       Assessment & Plan:   Problem List Items Addressed This Visit    Pedal edema - Primary    No significant pedal edema today - anticipate  L dependent pedal edema noted at home was due largely to compression from knee sleeve + recent abrasion to L calf. Reassured. Update if persistent or worsening swelling.           Follow up plan: Return if symptoms worsen or fail to improve.  [name], MD

## 2015-12-24 ENCOUNTER — Ambulatory Visit (INDEPENDENT_AMBULATORY_CARE_PROVIDER_SITE_OTHER): Payer: Medicare Other | Admitting: Internal Medicine

## 2015-12-24 ENCOUNTER — Encounter: Payer: Self-pay | Admitting: Internal Medicine

## 2015-12-24 VITALS — BP 128/80 | HR 81 | Temp 98.7°F | Wt 129.8 lb

## 2015-12-24 DIAGNOSIS — R42 Dizziness and giddiness: Secondary | ICD-10-CM | POA: Diagnosis not present

## 2015-12-24 DIAGNOSIS — R5383 Other fatigue: Secondary | ICD-10-CM | POA: Diagnosis not present

## 2015-12-24 NOTE — Progress Notes (Signed)
Pre visit review using our clinic review tool, if applicable. No additional management support is needed unless otherwise documented below in the visit note. 

## 2015-12-24 NOTE — Progress Notes (Signed)
Subjective:    Patient ID: Teresa Meyer, female    DOB: November 09, 1922, 82 y.o.   MRN: [id number]  HPI  Pt presents to the clinic today with a complaint of fatigue and intermittent dizziness x 5 days.  She reports the nurse at [facility] suggested she be seen today for a possible pneumonia.  She denies fevers, chills, cough, sputum production, shortness of breath, or chest pain.  She reports the dizziness is worse in the afternoon especially around bedtime, and states the dizziness is not related to position changes.  She admits to feeling shaky, and denies loss of consciousness, numbness or tingling in the extremities, or headaches.  She reports decreased appetite and 1 episode of emesis 5 days ago, but denies nausea, abdominal pain, diarrhea, or constipation.  She notes occasional left sided lower back pain that has improved since she stopped sitting in her computer chair 3 days ago.  She denies vision changes, dry skin, weight changes, cold intolerance, urinary frequency, urinary urgency, pain with urination, or changes in urine color.  She drinks hot tea throughout the day and a 1/2 glass of water daily.    Review of Systems   Past Medical History:  Diagnosis Date  . Arthritis   . Atypical nevi 09/2014   lentiginous dysplastic nevus with mod atypia ([place])  . Diverticulitis   . DNR (do not resuscitate)   . Esophageal stricture 2012  . GERD (gastroesophageal reflux disease)   . HLD (hyperlipidemia)    did not tolerate statin, stopped checking  . HTN (hypertension)   . Malignant melanoma ([place]) 2015, 2017   R chin s/p excision ([place]) R upper back [name])  . Osteoporosis, post-menopausal    bisphosphonate caused dysphagia, requests defer DEXA for now  . Seasonal allergies   . Wears hearing aid     Current Outpatient Prescriptions  Medication Sig Dispense Refill  . acetaminophen (TYLENOL) 500 MG tablet Take 500 mg by mouth daily as needed.    Marland Kitchen aspirin (ASPIRIN EC) 81 MG EC tablet  Take 81 mg by mouth once a week. Swallow whole.    . calcium carbonate (TUMS - DOSED IN MG ELEMENTAL CALCIUM) 500 MG chewable tablet Chew 1 tablet by mouth 2 (two) times daily with a meal.    . cholecalciferol (VITAMIN D) 1000 UNITS tablet Take 1,000 Units by mouth daily.    . diphenhydramine-acetaminophen (TYLENOL PM) 25-500 MG TABS Take 2 tablets by mouth at bedtime as needed.    . hydrochlorothiazide (HYDRODIURIL) 25 MG tablet TAKE 1 TABLET DAILY 90 tablet 0  . omeprazole (PRILOSEC OTC) 20 MG tablet Take 20 mg by mouth daily.    . prednisoLONE acetate (PRED MILD) 0.12 % ophthalmic suspension Place 1 drop into the left eye 2 (two) times daily.    . vitamin B-12 (CYANOCOBALAMIN) 1000 MCG tablet Take 1,000 mcg by mouth daily.     No current facility-administered medications for this visit.     Allergies  Allergen Reactions  . Bisphosphonates Other (See Comments)    Dysphagia to oral bisphosphonates  . Codeine   . Statins Other (See Comments)    Muscle aches  . Sulfa Antibiotics     Can't recall reaction  . Procaine Hcl Palpitations    Family History  Problem Relation Age of Onset  . Cancer Son     pancreas  . Cancer Sister     breast  . Cancer Mother     lung  . Diabetes Other  nephew  . Stroke Neg Hx   . CAD Neg Hx     Social History   Social History  . Marital status: Widowed    Spouse name: N/A  . Number of children: N/A  . Years of education: N/A   Occupational History  . Not on file.   Social History Main Topics  . Smoking status: Never Smoker  . Smokeless tobacco: Never Used  . Alcohol use 0.0 oz/week     Comment: 1 glass wine nightly  . Drug use: No  . Sexual activity: Not on file   Other Topics Concern  . 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  Denies headaches, fevers, chills, weight changes, or cold intolerance. Skin: Denies dry skin. HEENT: Denies vision changes. Pulm: Denies cough, shortness of breath, or sputum production. CV: Denies chest pain. GI: Pt reports vomiting.  Denies nausea, abdominal pain, diarrhea, or constipation. GU: Denies urinary urgency, frequency, pain with urination, or changes in urine color. MSK: Pt reports lower back pain. Neuro: Denies numbness or tingling.  No other specific complaints in a complete review of systems (except as listed in HPI above).       Objective:   Physical Exam  BP 128/80   Pulse 81   Temp 98.7 F (37.1 C) (Oral)   Wt 129 lb 12 oz (58.9 kg)   SpO2 99%   BMI 24.92 kg/m   General: Well-appearing, in no acute distress. HEENT: No scleral icterus or conjunctival injection.  EOMs intact.  Left pupil larger than right, pt reports history of left eye cataract surgery. Pulm: Clear to auscultation bilaterally. No wheezes, rales, or rhonchi. CV: Regular rate and rhythm.  No murmurs, rubs, or gallops. GI: Positive bowel sounds all four quadrants.  Soft, nontender to palpation. GU: No CVA tenderness noted. MSK: Spine and paraspinals nontender to palpation.  Full AROM of lumbar spine. Neuro: Cranial nerves intact.  Sensation to light touch intact throughout.  Able to complete finger-to-nose test.     Assessment & Plan:   Dizziness, fatigue:  Exam benign Will check CBC, CMP today Increase water intake  Call if symptoms worsen or do not resolve BAITY, REGINA, NP

## 2015-12-24 NOTE — Patient Instructions (Signed)
Fatigue  Fatigue is feeling tired all of the time, a lack of energy, or a lack of motivation. Occasional or mild fatigue is often a normal response to activity or life in general. However, long-lasting (chronic) or extreme fatigue may indicate an underlying medical condition.  HOME CARE INSTRUCTIONS   Watch your fatigue for any changes. The following actions may help to lessen any discomfort you are feeling:  · Talk to your health care provider about how much sleep you need each night. Try to get the required amount every night.  · Take medicines only as directed by your health care provider.  · Eat a healthy and nutritious diet. Ask your health care provider if you need help changing your diet.  · Drink enough fluid to keep your urine clear or pale yellow.  · Practice ways of relaxing, such as yoga, meditation, massage therapy, or acupuncture.  · Exercise regularly.    · Change situations that cause you stress. Try to keep your work and personal routine reasonable.  · Do not abuse illegal drugs.  · Limit alcohol intake to no more than 1 drink per day for nonpregnant women and 2 drinks per day for men. One drink equals 12 ounces of beer, 5 ounces of wine, or 1½ ounces of hard liquor.  · Take a multivitamin, if directed by your health care provider.  SEEK MEDICAL CARE IF:   · Your fatigue does not get better.  · You have a fever.    · You have unintentional weight loss or gain.  · You have headaches.    · You have difficulty:      Falling asleep.    Sleeping throughout the night.  · You feel angry, guilty, anxious, or sad.     · You are unable to have a bowel movement (constipation).    · You skin is dry.     · Your legs or another part of your body is swollen.    SEEK IMMEDIATE MEDICAL CARE IF:   · You feel confused.    · Your vision is blurry.  · You feel faint or pass out.    · You have a severe headache.    · You have severe abdominal, pelvic, or back pain.    · You have chest pain, shortness of breath, or an  irregular or fast heartbeat.    · You are unable to urinate or you urinate less than normal.    · You develop abnormal bleeding, such as bleeding from the rectum, vagina, nose, lungs, or nipples.  · You vomit blood.     · You have thoughts about harming yourself or committing suicide.    · You are worried that you might harm someone else.       This information is not intended to replace advice given to you by your health care provider. Make sure you discuss any questions you have with your health care provider.     Document Released: 03/07/2007 Document Revised: 05/31/2014 Document Reviewed: 09/11/2013  Elsevier Interactive Patient Education ©2016 [employer]

## 2015-12-25 ENCOUNTER — Other Ambulatory Visit: Payer: Self-pay | Admitting: Internal Medicine

## 2015-12-25 LAB — CBC
HCT: 25.5 % — ABNORMAL LOW (ref 36.0–46.0)
Hemoglobin: 8.6 g/dL — ABNORMAL LOW (ref 12.0–15.0)
MCHC: 33.9 g/dL (ref 30.0–36.0)
MCV: 99 fl (ref 78.0–100.0)
Platelets: 239 10*3/uL (ref 150.0–400.0)
RBC: 2.58 Mil/uL — ABNORMAL LOW (ref 3.87–5.11)
RDW: 13.3 % (ref 11.5–15.5)
WBC: 8.3 10*3/uL (ref 4.0–10.5)

## 2015-12-25 LAB — COMPREHENSIVE METABOLIC PANEL
ALT: 11 U/L (ref 0–35)
AST: 19 U/L (ref 0–37)
Albumin: 4.1 g/dL (ref 3.5–5.2)
Alkaline Phosphatase: 37 U/L — ABNORMAL LOW (ref 39–117)
BUN: 23 mg/dL (ref 6–23)
CO2: 31 mEq/L (ref 19–32)
Calcium: 10.1 mg/dL (ref 8.4–10.5)
Chloride: 96 mEq/L (ref 96–112)
Creatinine, Ser: 0.82 mg/dL (ref 0.40–1.20)
GFR: 69.37 mL/min (ref >60.00)
Glucose, Bld: 113 mg/dL — ABNORMAL HIGH (ref 70–99)
Potassium: 3.1 mEq/L — ABNORMAL LOW (ref 3.5–5.1)
Sodium: 135 mEq/L (ref 135–145)
Total Bilirubin: 0.4 mg/dL (ref 0.2–1.2)
Total Protein: 6.7 g/dL (ref 6.0–8.3)

## 2015-12-25 MED ORDER — POTASSIUM CHLORIDE ER 10 MEQ PO TBCR: 10.0000 meq | EXTENDED_RELEASE_TABLET | Freq: Two times a day (BID) | ORAL | 0 refills | Status: DC

## 2015-12-26 ENCOUNTER — Ambulatory Visit (INDEPENDENT_AMBULATORY_CARE_PROVIDER_SITE_OTHER): Payer: Medicare Other | Admitting: Family Medicine

## 2015-12-26 ENCOUNTER — Encounter: Payer: Self-pay | Admitting: Family Medicine

## 2015-12-26 VITALS — BP 130/64 | HR 74 | Temp 98.0°F | Wt 129.5 lb

## 2015-12-26 DIAGNOSIS — D539 Nutritional anemia, unspecified: Secondary | ICD-10-CM | POA: Insufficient documentation

## 2015-12-26 DIAGNOSIS — D649 Anemia, unspecified: Secondary | ICD-10-CM

## 2015-12-26 DIAGNOSIS — D7589 Other specified diseases of blood and blood-forming organs: Secondary | ICD-10-CM

## 2015-12-26 LAB — IBC PANEL
Iron: 58 ug/dL (ref 42–145)
Saturation Ratios: 17.4 % — ABNORMAL LOW (ref 20.0–50.0)
Transferrin: 238 mg/dL (ref 212.0–360.0)

## 2015-12-26 LAB — CBC WITH DIFFERENTIAL/PLATELET
Basophils Absolute: 0 10*3/uL (ref 0.0–0.1)
Basophils Relative: 0.6 % (ref 0.0–3.0)
Eosinophils Absolute: 0.2 10*3/uL (ref 0.0–0.7)
Eosinophils Relative: 2.6 % (ref 0.0–5.0)
HCT: 24.5 % — ABNORMAL LOW (ref 36.0–46.0)
Hemoglobin: 8.3 g/dL — ABNORMAL LOW (ref 12.0–15.0)
Lymphocytes Relative: 20.3 % (ref 12.0–46.0)
Lymphs Abs: 1.3 10*3/uL (ref 0.7–4.0)
MCHC: 34 g/dL (ref 30.0–36.0)
MCV: 98.8 fl (ref 78.0–100.0)
Monocytes Absolute: 0.7 10*3/uL (ref 0.1–1.0)
Monocytes Relative: 10 % (ref 3.0–12.0)
Neutro Abs: 4.4 10*3/uL (ref 1.4–7.7)
Neutrophils Relative %: 66.5 % (ref 43.0–77.0)
Platelets: 269 10*3/uL (ref 150.0–400.0)
RBC: 2.48 Mil/uL — ABNORMAL LOW (ref 3.87–5.11)
RDW: 13.5 % (ref 11.5–15.5)
WBC: 6.6 10*3/uL (ref 4.0–10.5)

## 2015-12-26 LAB — FERRITIN: Ferritin: 115.7 ng/mL (ref 10.0–291.0)

## 2015-12-26 LAB — FOLATE: Folate: 12.1 ng/mL (ref >5.9)

## 2015-12-26 LAB — VITAMIN B12: Vitamin B-12: 1205 pg/mL — ABNORMAL HIGH (ref 211–911)

## 2015-12-26 NOTE — Assessment & Plan Note (Signed)
New. Recheck CBC as well as anemia panel today and iFOB. Discussed possible causes of anemia. No red flags on exam or history. Await labs.  Recent Cr WNL.

## 2015-12-26 NOTE — Progress Notes (Signed)
BP 130/64   Pulse 74   Temp 98 F (36.7 C) (Oral)   Wt 129 lb 8 oz (58.7 kg)   SpO2 99%   BMI 24.87 kg/m    CC: f/u abnormal labs Subjective:    Patient ID: Teresa Meyer, female    DOB: [birth year], 82 y.o.   MRN: [id number]  HPI: Teresa Meyer is a 82 y.o. female presenting on 12/26/2015 for Follow-up   Seen earlier this week with dizziness, cough. Labs revealed low potassium (Kdur started 15mEq BID) and anemia wih Hgb 8.6. Denies any blood in stool, urine, vaginal bleeding. Intermittent dizziness worse with walking. Denies dyspnea, chest pain, palpitations, or headaches.   Taking aspirin 81mg  weekly.  She takes 1 tylenol PM nightly.  Regularly takes 1060mcg b12 daily.  No results found for: VITAMINB12   Ho duodenitis and GEJ stricture s/p EGD with balloon dilation 2010 Deatra Ina). No trouble since then. Pt unsure if she's taking prilosec OTC.   Denies weight changes recently, dysphagia, abd pain, chest pain, night sweats, prolonged productive cough.   Relevant past medical, surgical, family and social history reviewed and updated as indicated. Interim medical history since our last visit reviewed. Allergies and medications reviewed and updated. Current Outpatient Prescriptions on File Prior to Visit  Medication Sig  . aspirin (ASPIRIN EC) 81 MG EC tablet Take 81 mg by mouth once a week. Swallow whole.  . calcium carbonate (TUMS - DOSED IN MG ELEMENTAL CALCIUM) 500 MG chewable tablet Chew 1 tablet by mouth 2 (two) times daily with a meal.  . cholecalciferol (VITAMIN D) 1000 UNITS tablet Take 1,000 Units by mouth daily.  . diphenhydramine-acetaminophen (TYLENOL PM) 25-500 MG TABS Take 2 tablets by mouth at bedtime as needed.  . hydrochlorothiazide (HYDRODIURIL) 25 MG tablet TAKE 1 TABLET DAILY  . omeprazole (PRILOSEC OTC) 20 MG tablet Take 20 mg by mouth daily.  . potassium chloride (K-DUR) 10 MEQ tablet Take 1 tablet (10 mEq total) by mouth 2 (two) times daily.  . prednisoLONE  acetate (PRED MILD) 0.12 % ophthalmic suspension Place 1 drop into the left eye daily.   . vitamin B-12 (CYANOCOBALAMIN) 1000 MCG tablet Take 1,000 mcg by mouth daily.   No current facility-administered medications on file prior to visit.     Review of Systems Per HPI unless specifically indicated in ROS section     Objective:    BP 130/64   Pulse 74   Temp 98 F (36.7 C) (Oral)   Wt 129 lb 8 oz (58.7 kg)   SpO2 99%   BMI 24.87 kg/m   Wt Readings from Last 3 Encounters:  12/26/15 129 lb 8 oz (58.7 kg)  12/24/15 129 lb 12 oz (58.9 kg)  10/28/15 129 lb 12.8 oz (58.9 kg)    Physical Exam  Constitutional: She appears well-developed and well-nourished. No distress.  HENT:  Mouth/Throat: Oropharynx is clear and moist. No oropharyngeal exudate.  Eyes: Pupils are equal, round, and reactive to light.  Conjunctival pallor  Cardiovascular: Normal rate, regular rhythm and intact distal pulses.   Murmur (flow systolic) heard. Pulmonary/Chest: Effort normal and breath sounds normal. No respiratory distress. She has no wheezes. She has no rales.  Musculoskeletal: She exhibits no edema.  Skin: No rash noted. There is pallor.  Nursing note and vitals reviewed.  Results for orders placed or performed in visit on 12/24/15  CBC  Result Value Ref Range   WBC 8.3 4.0 - 10.5 K/uL   RBC  2.58 aL (L) 3.87 - 5.11 Mil/uL   Platelets 239.0 150.0 - 400.0 K/uL   Hemoglobin 8.6 aL (L) 12.0 - 15.0 g/dL   HCT 25.5 aL (L) 36.0 - 46.0 %   MCV 99.0 78.0 - 100.0 fl   MCHC 33.9 30.0 - 36.0 g/dL   RDW 13.3 11.5 - 15.5 %  Comprehensive metabolic panel  Result Value Ref Range   Sodium 135 135 - 145 mEq/L   Potassium 3.1 (L) 3.5 - 5.1 mEq/L   Chloride 96 96 - 112 mEq/L   CO2 31 19 - 32 mEq/L   Glucose, Bld 113 (H) 70 - 99 mg/dL   BUN 23 6 - 23 mg/dL   Creatinine, Ser 0.82 0.40 - 1.20 mg/dL   Total Bilirubin 0.4 0.2 - 1.2 mg/dL   Alkaline Phosphatase 37 (L) 39 - 117 U/L   AST 19 0 - 37 U/L   ALT 11  0 - 35 U/L   Total Protein 6.7 6.0 - 8.3 g/dL   Albumin 4.1 3.5 - 5.2 g/dL   Calcium 10.1 8.4 - 10.5 mg/dL   GFR 69.37 >60.00 mL/min      Assessment & Plan:   Problem List Items Addressed This Visit    Anemia - Primary    New. Recheck CBC as well as anemia panel today and iFOB. Discussed possible causes of anemia. No red flags on exam or history. Await labs.  Recent Cr WNL.       Relevant Medications   ferrous sulfate 325 (65 FE) MG tablet   Other Relevant Orders   Vitamin B12   Ferritin   IBC panel   Folate   CBC with Differential/Platelet   Fecal occult blood, imunochemical    Other Visit Diagnoses    Macrocytosis       Relevant Orders   Vitamin B12   Folate       Follow up plan: No Follow-up on file.  [name], MD

## 2015-12-26 NOTE — Progress Notes (Signed)
Pre visit review using our clinic review tool, if applicable. No additional management support is needed unless otherwise documented below in the visit note. 

## 2015-12-26 NOTE — Patient Instructions (Addendum)
You have a new anemia - repeat labs today to further evaluate cause.  Complete stool kit. When you finish stool kit, start iron tablet over the counter ferrous sulfate 360m (65FE) once daily.  Double check to see if youre taking omeprazole 253mdaily.  Make sure you're not taking aleve - tylenol PM is ok for night time.

## 2015-12-27 ENCOUNTER — Other Ambulatory Visit: Payer: Self-pay | Admitting: Family Medicine

## 2015-12-27 DIAGNOSIS — D500 Iron deficiency anemia secondary to blood loss (chronic): Secondary | ICD-10-CM

## 2015-12-31 ENCOUNTER — Telehealth: Payer: Self-pay | Admitting: Family Medicine

## 2015-12-31 NOTE — Telephone Encounter (Signed)
Can you please notify pt of her lab results so that we can move forward with her GI referral?  Thanks

## 2015-12-31 NOTE — Telephone Encounter (Signed)
Pt was notified.  

## 2016-01-01 ENCOUNTER — Other Ambulatory Visit (INDEPENDENT_AMBULATORY_CARE_PROVIDER_SITE_OTHER): Payer: Medicare Other

## 2016-01-01 ENCOUNTER — Ambulatory Visit (INDEPENDENT_AMBULATORY_CARE_PROVIDER_SITE_OTHER): Payer: Medicare Other | Admitting: Gastroenterology

## 2016-01-01 ENCOUNTER — Encounter: Payer: Self-pay | Admitting: Gastroenterology

## 2016-01-01 VITALS — BP 102/62 | HR 76 | Ht 66.0 in | Wt 130.4 lb

## 2016-01-01 DIAGNOSIS — D649 Anemia, unspecified: Secondary | ICD-10-CM | POA: Diagnosis not present

## 2016-01-01 DIAGNOSIS — K219 Gastro-esophageal reflux disease without esophagitis: Secondary | ICD-10-CM

## 2016-01-01 DIAGNOSIS — K222 Esophageal obstruction: Secondary | ICD-10-CM

## 2016-01-01 LAB — FECAL OCCULT BLOOD, IMMUNOCHEMICAL: Fecal Occult Bld: NEGATIVE

## 2016-01-01 NOTE — Patient Instructions (Signed)
If you are age 80 or older, your body mass index should be between 23-30. Your Body mass index is 21.04 kg/m. If this is out of the aforementioned range listed, please consider follow up with your Primary Care Provider.  If you are age 69 or younger, your body mass index should be between 19-25. Your Body mass index is 21.04 kg/m. If this is out of the aformentioned range listed, please consider follow up with your Primary Care Provider.   Thank you for choosing [facility]  Dr Wilfrid Lund III

## 2016-01-01 NOTE — Progress Notes (Signed)
[facility] Consult Note:  History: Teresa Meyer 01/01/2016  Referring physician: [name], MD  Reason for consult/chief complaint: Anemia (low Hgb test) and Fatigue (weekness)   Subjective  HPI:  This delightful 82 year old woman was referred by primary care for anemia. She presents with her daughter, who assists with the history. Teresa Meyer was recently found to be anemic with hemoglobin of 8.5 and a normal MCV. Iron saturation slightly low at 17, ferritin normal at 155, B12 and folate normal. She denies abdominal pain, altered bowel habits, rectal bleeding, dysphagia, and early satiety, nausea or vomiting, or weight loss. She had an upper endoscopy in September 2010 with Dr. Deatra Ina, a distal esophageal stricture was balloon dilated. Sometimes Teresa Meyer feels fatigued or lightheaded when she stands. Iron tablets were recently started. A kit for fecal occult blood collection was given, the patient reports that she has done it and we'll bring it to her primary care physician today.  ROS:  Review of Systems  Constitutional: Negative for appetite change and unexpected weight change.  HENT: Negative for mouth sores and voice change.   Eyes: Negative for pain and redness.  Respiratory: Negative for cough and shortness of breath.   Cardiovascular: Positive for leg swelling. Negative for chest pain and palpitations.       Filling of the distal calves and feet  Genitourinary: Negative for dysuria and hematuria.  Musculoskeletal: Positive for arthralgias. Negative for myalgias.  Skin: Negative for pallor and rash.  Neurological: Negative for weakness and headaches.  Hematological: Negative for adenopathy.     Past Medical History: Past Medical History:  Diagnosis Date  . Arthritis   . Atypical nevi 09/2014   lentiginous dysplastic nevus with mod atypia ([place])  . Diverticulitis   . DNR (do not resuscitate)   . Esophageal stricture 2012  . GERD (gastroesophageal reflux  disease)   . HLD (hyperlipidemia)    did not tolerate statin, stopped checking  . HTN (hypertension)   . Malignant melanoma ([place]) 2015, 2017   R chin s/p excision ([place]) R upper back [name])  . Osteoporosis, post-menopausal    bisphosphonate caused dysphagia, requests defer DEXA for now  . Seasonal allergies   . Wears hearing aid      Past Surgical History: Past Surgical History:  Procedure Laterality Date  . BUNIONECTOMY Right 2001  . CARPAL TUNNEL RELEASE Bilateral 1974  . CATARACT EXTRACTION Bilateral    [place]  . TONSILLECTOMY  1928     Family History: Family History  Problem Relation Age of Onset  . Pancreatic cancer Son   . Breast cancer Sister   . Lung cancer Mother   . Diabetes Other     nephew  . Stroke Neg Hx   . CAD Neg Hx     Social History: Social History   Social History  . Marital status: Widowed    Spouse name: N/A  . Number of children: 3  . Years of education: N/A   Occupational History  . travel agent/retired    Social History Main Topics  . Smoking status: Never Smoker  . Smokeless tobacco: Never Used  . Alcohol use 0.0 oz/week     Comment: 1/2 glass wine nightly  . Drug use: No  . Sexual activity: Not Asked   Other Topics Concern  . None   Social History Narrative   Lives alone at [place] - husband died of lung cancer   Daughter in [place], daughter in [place]  Occ: travel agency, retired   Edu: college   Activity: 1 mile walking, swimming, Charity fundraiser   Diet: seldom water, fruits/vegetables daily    Allergies: Allergies  Allergen Reactions  . Bisphosphonates Other (See Comments)    Dysphagia to oral bisphosphonates  . Codeine   . Statins Other (See Comments)    Muscle aches  . Sulfa Antibiotics     Can't recall reaction  . Procaine Hcl Palpitations    Outpatient Meds: Current Outpatient Prescriptions  Medication Sig Dispense Refill  . aspirin (ASPIRIN EC) 81 MG EC tablet Take 81 mg by  mouth once a week. Swallow whole.    . calcium carbonate (TUMS - DOSED IN MG ELEMENTAL CALCIUM) 500 MG chewable tablet Chew 1 tablet by mouth 2 (two) times daily with a meal.    . cholecalciferol (VITAMIN D) 1000 UNITS tablet Take 1,000 Units by mouth daily.    . diphenhydramine-acetaminophen (TYLENOL PM) 25-500 MG TABS Take 2 tablets by mouth at bedtime as needed.    . ferrous sulfate 325 (65 FE) MG tablet Take 325 mg by mouth daily with breakfast.    . hydrochlorothiazide (HYDRODIURIL) 25 MG tablet TAKE 1 TABLET DAILY 90 tablet 0  . omeprazole (PRILOSEC OTC) 20 MG tablet Take 20 mg by mouth daily.    . potassium chloride (K-DUR) 10 MEQ tablet Take 1 tablet (10 mEq total) by mouth 2 (two) times daily. 60 tablet 0  . prednisoLONE acetate (PRED MILD) 0.12 % ophthalmic suspension Place 1 drop into the left eye daily.     . vitamin B-12 (CYANOCOBALAMIN) 1000 MCG tablet Take 1,000 mcg by mouth daily.     No current facility-administered medications for this visit.     She reports taking aspirin once a week  ___________________________________________________________________ Objective   Exam:  BP 102/62 (BP Location: Left Arm, Patient Position: Sitting, Cuff Size: Normal)   Pulse 76   Ht 5' 6"  (1.676 m) Comment: height measured without shoes  Wt 130 lb 6 oz (59.1 kg)   BMI 21.04 kg/m    General: this is a(n) well-appearing elderly woman, somewhat hard of hearing   Eyes: sclera anicteric, no redness  ENT: oral mucosa moist without lesions, no cervical or supraclavicular lymphadenopathy, good dentition  CV: RRR with a soft systolic, L8/V5, no JVD, mild peripheral edema  Resp: clear to auscultation bilaterally, normal RR and effort noted  GI: soft, no tenderness, with active bowel sounds. No guarding or palpable organomegaly noted.  Skin; warm and dry, no rash or jaundice noted. Post skin cancer surgical changes both legs  Neuro: awake, alert and oriented x 3. Normal gross motor  function and fluent speech  Labs:  CBC    Component Value Date/Time   WBC 6.6 12/26/2015 1206   RBC 2.48 (L) 12/26/2015 1206   HGB 8.3 (L) 12/26/2015 1206   HGB 13.0 03/20/2012   HCT 24.5 (L) 12/26/2015 1206   PLT 269.0 12/26/2015 1206   MCV 98.8 12/26/2015 1206   MCHC 34.0 12/26/2015 1206   RDW 13.5 12/26/2015 1206   LYMPHSABS 1.3 12/26/2015 1206   MONOABS 0.7 12/26/2015 1206   EOSABS 0.2 12/26/2015 1206   BASOSABS 0.0 12/26/2015 1206   CMP Latest Ref Rng & Units 12/24/2015 09/17/2013 03/20/2012  Glucose 70 - 99 mg/dL 113(H) 96 -  BUN 6 - 23 mg/dL 23 17 -  Creatinine 0.40 - 1.20 mg/dL 0.82 0.9 0.86  Sodium 135 - 145 mEq/L 135 137 -  Potassium 3.5 -  5.1 mEq/L 3.1(L) 3.9 3.8  Chloride 96 - 112 mEq/L 96 100 -  CO2 19 - 32 mEq/L 31 28 -  Calcium 8.4 - 10.5 mg/dL 10.1 9.5 -  Total Protein 6.0 - 8.3 g/dL 6.7 - -  Total Bilirubin 0.2 - 1.2 mg/dL 0.4 - -  Alkaline Phos 39 - 117 U/L 37(L) - -  AST 0 - 37 U/L 19 - -  ALT 0 - 35 U/L 11 - -   hasnt done stool test  (iFOBT) Iron 58 17.4 %sat Ferritin 115 folate 12 B12 1200  Assessment: Encounter Diagnoses  Name Primary?  Marland Kitchen Anemia, unspecified anemia type Yes  . Gastroesophageal reflux disease, esophagitis presence not specified   . ESOPHAGEAL STRICTURE     It is not clear that this is iron deficiency anemia given the mildly low percent saturation and a normal ferritin. It appears more like a marrow production problems such as MDS in a 82 year old woman.  Plan:  I agree with a short trial of iron supplement to see if that will improve her hemoglobin. I recommend that the hemoglobin be rechecked by primary care 4-6 weeks after the last check. I think the risks of endoscopic procedures outweigh the expected benefits in this elderly patient with no localizing GI symptoms, and both she and her daughter are in agreement. I'm not sure what we will do with the stool test is positive, since it will still be of unclear relation to  the anemia.  Thank you for the courtesy of this consult.  Please call me with any questions or concerns.  Nelida Meuse III  CC: [name], MD

## 2016-01-11 ENCOUNTER — Other Ambulatory Visit: Payer: Self-pay | Admitting: Family Medicine

## 2016-01-11 DIAGNOSIS — D649 Anemia, unspecified: Secondary | ICD-10-CM

## 2016-01-12 ENCOUNTER — Other Ambulatory Visit (INDEPENDENT_AMBULATORY_CARE_PROVIDER_SITE_OTHER): Payer: Medicare Other

## 2016-01-12 ENCOUNTER — Ambulatory Visit (INDEPENDENT_AMBULATORY_CARE_PROVIDER_SITE_OTHER): Payer: Medicare Other

## 2016-01-12 VITALS — BP 100/64 | HR 78 | Temp 98.3°F | Ht 60.0 in | Wt 127.5 lb

## 2016-01-12 DIAGNOSIS — D649 Anemia, unspecified: Secondary | ICD-10-CM | POA: Diagnosis not present

## 2016-01-12 DIAGNOSIS — Z0000 Encounter for general adult medical examination without abnormal findings: Secondary | ICD-10-CM

## 2016-01-12 LAB — CBC WITH DIFFERENTIAL/PLATELET
Basophils Absolute: 0 10*3/uL (ref 0.0–0.1)
Basophils Relative: 0.4 % (ref 0.0–3.0)
Eosinophils Absolute: 0.1 10*3/uL (ref 0.0–0.7)
Eosinophils Relative: 0.9 % (ref 0.0–5.0)
HCT: 30.3 % — ABNORMAL LOW (ref 36.0–46.0)
Hemoglobin: 10.4 g/dL — ABNORMAL LOW (ref 12.0–15.0)
Lymphocytes Relative: 14.3 % (ref 12.0–46.0)
Lymphs Abs: 0.9 10*3/uL (ref 0.7–4.0)
MCHC: 34.3 g/dL (ref 30.0–36.0)
MCV: 99.6 fl (ref 78.0–100.0)
Monocytes Absolute: 0.5 10*3/uL (ref 0.1–1.0)
Monocytes Relative: 8 % (ref 3.0–12.0)
Neutro Abs: 4.9 10*3/uL (ref 1.4–7.7)
Neutrophils Relative %: 76.4 % (ref 43.0–77.0)
Platelets: 299 10*3/uL (ref 150.0–400.0)
RBC: 3.04 Mil/uL — ABNORMAL LOW (ref 3.87–5.11)
RDW: 14.4 % (ref 11.5–15.5)
WBC: 6.5 10*3/uL (ref 4.0–10.5)

## 2016-01-12 LAB — IBC PANEL
Iron: 65 ug/dL (ref 42–145)
Saturation Ratios: 19.1 % — ABNORMAL LOW (ref 20.0–50.0)
Transferrin: 243 mg/dL (ref 212.0–360.0)

## 2016-01-12 LAB — FERRITIN: Ferritin: 74.5 ng/mL (ref 10.0–291.0)

## 2016-01-12 NOTE — Patient Instructions (Signed)
Ms. Marquiss , Thank you for taking time to come for your Medicare Wellness Visit. I appreciate your ongoing commitment to your health goals. Please review the following plan we discussed and let me know if I can assist you in the future.   These are the goals we discussed: Goals    Starting 01/12/2016, I will continue to exercise for at least 30 min daily.       This is a list of the screening recommended for you and due dates:  Health Maintenance  Topic Date Due  . Flu Shot  05/23/2016*  . DTaP/Tdap/Td vaccine (1 - Tdap) 01/11/2017*  . Shingles Vaccine  01/11/2017*  . Tetanus Vaccine  01/11/2017*  . Pneumonia vaccines (1 of 2 - PCV13) 01/11/2017*  . DEXA scan (bone density measurement)  Completed  *Topic was postponed. The date shown is not the original due date.   Preventive Care for Adults  A healthy lifestyle and preventive care can promote health and wellness. Preventive health guidelines for adults include the following key practices.  . A routine yearly physical is a good way to check with your health care provider about your health and preventive screening. It is a chance to share any concerns and updates on your health and to receive a thorough exam.  . Visit your dentist for a routine exam and preventive care every 6 months. Brush your teeth twice a day and floss once a day. Good oral hygiene prevents tooth decay and gum disease.  . The frequency of eye exams is based on your age, health, family medical history, use  of contact lenses, and other factors. Follow your health care provider's ecommendations for frequency of eye exams.  . Eat a healthy diet. Foods like vegetables, fruits, whole grains, low-fat dairy products, and lean protein foods contain the nutrients you need without too many calories. Decrease your intake of foods high in solid fats, added sugars, and salt. Eat the right amount of calories for you. Get information about a proper diet from your health care  provider, if necessary.  . Regular physical exercise is one of the most important things you can do for your health. Most adults should get at least 150 minutes of moderate-intensity exercise (any activity that increases your heart rate and causes you to sweat) each week. In addition, most adults need muscle-strengthening exercises on 2 or more days a week.  Silver Sneakers may be a benefit available to you. To determine eligibility, you may visit the website: www.silversneakers.com or contact program at (234)454-5167 Mon-Fri between 8AM-8PM.   . Maintain a healthy weight. The body mass index (BMI) is a screening tool to identify possible weight problems. It provides an estimate of body fat based on height and weight. Your health care provider can find your BMI and can help you achieve or maintain a healthy weight.   For adults 20 years and older: ? A BMI below 18.5 is considered underweight. ? A BMI of 18.5 to 24.9 is normal. ? A BMI of 25 to 29.9 is considered overweight. ? A BMI of 30 and above is considered obese.   . Maintain normal blood lipids and cholesterol levels by exercising and minimizing your intake of saturated fat. Eat a balanced diet with plenty of fruit and vegetables. Blood tests for lipids and cholesterol should begin at age 38 and be repeated every 5 years. If your lipid or cholesterol levels are high, you are over 50, or you are at high risk for  heart disease, you may need your cholesterol levels checked more frequently. Ongoing high lipid and cholesterol levels should be treated with medicines if diet and exercise are not working.  . If you smoke, find out from your health care provider how to quit. If you do not use tobacco, please do not start.  . If you choose to drink alcohol, please do not consume more than 2 drinks per day. One drink is considered to be 12 ounces (355 mL) of beer, 5 ounces (148 mL) of wine, or 1.5 ounces (44 mL) of liquor.  . If you are 59-79 years  old, ask your health care provider if you should take aspirin to prevent strokes.  . Use sunscreen. Apply sunscreen liberally and repeatedly throughout the day. You should seek shade when your shadow is shorter than you. Protect yourself by wearing long sleeves, pants, a wide-brimmed hat, and sunglasses year round, whenever you are outdoors.  . Once a month, do a whole body skin exam, using a mirror to look at the skin on your back. Tell your health care provider of new moles, moles that have irregular borders, moles that are larger than a pencil eraser, or moles that have changed in shape or color.

## 2016-01-12 NOTE — Progress Notes (Signed)
Pre visit review using our clinic review tool, if applicable. No additional management support is needed unless otherwise documented below in the visit note. 

## 2016-01-12 NOTE — Progress Notes (Signed)
PCP notes:   Health maintenance:  Tetanus - postponed/insurance Shingles - postponed/insurance Flu vaccine - addressed PCV13 - postponed/pt will get immunization record from [facility]  Abnormal screenings:   Cognitive/Mini-Cog score: 19/20  Patient concerns:   None  Nurse concerns:  None  Next PCP appt:  01/29/2016 @ 1230

## 2016-01-12 NOTE — Progress Notes (Signed)
Subjective:   Teresa Meyer is a 82 y.o. female who presents for Medicare Annual (Subsequent) preventive examination.  Review of Systems:  N/A Cardiac Risk Factors include: advanced age (>42men, >40 women);dyslipidemia;hypertension     Objective:     Vitals: BP 100/64 (BP Location: Left Arm, Patient Position: Sitting, Cuff Size: Normal)   Pulse 78   Temp 98.3 F (36.8 C) (Oral)   Ht 5' (1.524 m) Comment: no shoes  Wt 127 lb 8 oz (57.8 kg)   SpO2 96%   BMI 24.90 kg/m   Body mass index is 24.9 kg/m.   Tobacco History  Smoking Status  . Never Smoker  Smokeless Tobacco  . Never Used     Counseling given: No   Past Medical History:  Diagnosis Date  . Arthritis   . Atypical nevi 09/2014   lentiginous dysplastic nevus with mod atypia ([place])  . Diverticulitis   . DNR (do not resuscitate)   . Esophageal stricture 2012  . GERD (gastroesophageal reflux disease)   . HLD (hyperlipidemia)    did not tolerate statin, stopped checking  . HTN (hypertension)   . Malignant melanoma ([place]) 2015, 2017   R chin s/p excision ([place]) R upper back [name])  . Osteoporosis, post-menopausal    bisphosphonate caused dysphagia, requests defer DEXA for now  . Seasonal allergies   . Wears hearing aid    Past Surgical History:  Procedure Laterality Date  . BUNIONECTOMY Right 2001  . CARPAL TUNNEL RELEASE Bilateral 1974  . CATARACT EXTRACTION Bilateral    [place]  . TONSILLECTOMY  1928   Family History  Problem Relation Age of Onset  . Pancreatic cancer Son   . Breast cancer Sister   . Lung cancer Mother   . Diabetes Other     nephew  . Stroke Neg Hx   . CAD Neg Hx    History  Sexual Activity  . Sexual activity: No    Outpatient Encounter Prescriptions as of 01/12/2016  Medication Sig  . aspirin (ASPIRIN EC) 81 MG EC tablet Take 81 mg by mouth once a week. Swallow whole.  . calcium carbonate (TUMS - DOSED IN MG ELEMENTAL CALCIUM) 500 MG chewable tablet Chew 1  tablet by mouth 2 (two) times daily with a meal.  . cholecalciferol (VITAMIN D) 1000 UNITS tablet Take 1,000 Units by mouth daily.  . diphenhydramine-acetaminophen (TYLENOL PM) 25-500 MG TABS Take 2 tablets by mouth at bedtime as needed.  . ferrous sulfate 325 (65 FE) MG tablet Take 325 mg by mouth daily with breakfast.  . hydrochlorothiazide (HYDRODIURIL) 25 MG tablet TAKE 1 TABLET DAILY  . prednisoLONE acetate (PRED MILD) 0.12 % ophthalmic suspension Place 1 drop into the left eye daily.   . vitamin B-12 (CYANOCOBALAMIN) 1000 MCG tablet Take 1,000 mcg by mouth daily.  Marland Kitchen omeprazole (PRILOSEC OTC) 20 MG tablet Take 20 mg by mouth daily.  . [DISCONTINUED] potassium chloride (K-DUR) 10 MEQ tablet Take 1 tablet (10 mEq total) by mouth 2 (two) times daily.   No facility-administered encounter medications on file as of 01/12/2016.     Activities of Daily Living In your present state of health, do you have any difficulty performing the following activities: 01/12/2016  Hearing? Y  Vision? N  Difficulty concentrating or making decisions? Y  Walking or climbing stairs? Y  Dressing or bathing? N  Doing errands, shopping? N  Preparing Food and eating ? N  Using the Toilet? N  In the past  six months, have you accidently leaked urine? N  Do you have problems with loss of bowel control? N  Managing your Medications? N  Managing your Finances? N  Housekeeping or managing your Housekeeping? N  Some recent data might be hidden    Patient Care Team: [name], MD as PCP - General (Family Medicine) Rolm Bookbinder, MD as Consulting Physician (Dermatology) Dereck Leep, MD as Consulting Physician (Orthopedic Surgery) [name], MD as Referring Physician (Ophthalmology) Kipp Laurence, MD as Referring Physician (Audiology) Wandra Mannan, DMD as Referring Physician (Dentistry)    Assessment:    Hearing Screening Comments: Wears  Bilateral hearing aids Vision Screening Comments:  Last vision exam approx. 3-4 months ago with Dr. Sandra Cockayne  Exercise Activities and Dietary recommendations Current Exercise Habits: Home exercise routine, Type of exercise: Other - see comments (resistance machines), Time (Minutes): 30, Frequency (Times/Week): 3, Weekly Exercise (Minutes/Week): 90, Intensity: Mild, Exercise limited by: orthopedic condition(s)  Goals    . Increase physical activity          Starting 01/12/2016, I will continue to exercise for at least 30 min daily.      Fall Risk Fall Risk  01/12/2016 01/10/2015 09/24/2013  Falls in the past year? No Yes No  Number falls in past yr: - 1 -   Depression Screen PHQ 2/9 Scores 01/12/2016 01/10/2015 09/24/2013  PHQ - 2 Score 0 0 0     Cognitive Testing MMSE - Mini Mental State Exam 01/12/2016  Orientation to time 5  Orientation to Place 5  Registration 3  Attention/ Calculation 0  Recall 2  Recall-comments pt was unable to recall 1 of 3 words  Language- name 2 objects 0  Language- repeat 1  Language- follow 3 step command 3  Language- read & follow direction 0  Write a sentence 0  Copy design 0  Total score 19   PLEASE NOTE: A Mini-Cog screen was completed. Maximum score is 20. A value of 0 denotes this part of Folstein MMSE was not completed or the patient failed this part of the Mini-Cog screening.   Mini-Cog Screening Orientation to Time - Max 5 pts Orientation to Place - Max 5 pts Registration - Max 3 pts Recall - Max 3 pts Language Repeat - Max 1 pts Language Follow 3 Step Command - Max 3 pts  Immunization History  Administered Date(s) Administered  . Influenza,inj,Quad PF,36+ Mos 03/27/2013  . Influenza-Unspecified 02/21/2014   Screening Tests Health Maintenance  Topic Date Due  . INFLUENZA VACCINE  05/23/2016 (Originally 12/23/2015)  . DTaP/Tdap/Td (1 - Tdap) 01/11/2017 (Originally 06/04/1941)  . ZOSTAVAX  01/11/2017 (Originally 06/03/1984)  . TETANUS/TDAP  01/11/2017 (Originally 06/04/1941)  . PNA vac  Low Risk Adult (1 of 2 - PCV13) 01/11/2017 (Originally 06/03/1989)  . DEXA SCAN  Completed      Plan:     I have personally reviewed and addressed the Medicare Annual Wellness questionnaire and have noted the following in the patient's chart:  A. Medical and social history B. Use of alcohol, tobacco or illicit drugs  C. Current medications and supplements D. Functional ability and status E.  Nutritional status F.  Physical activity G. Advance directives H. List of other physicians I.  Hospitalizations, surgeries, and ER visits in previous 12 months J.  Dooms to include hearing, vision, cognitive, depression L. Referrals and appointments - none  In addition, I have reviewed and discussed with patient certain preventive protocols, quality metrics, and best  practice recommendations. A written personalized care plan for preventive services as well as general preventive health recommendations were provided to patient.  See attached scanned questionnaire for additional information.   Signed,   Lindell Noe, MHA, BS, LPN Health Advisor

## 2016-01-13 LAB — PATHOLOGIST SMEAR REVIEW

## 2016-01-14 LAB — PROTEIN ELECTROPHORESIS, SERUM, WITH REFLEX
Albumin ELP: 4 g/dL (ref 3.8–4.8)
Alpha-1-Globulin: 0.3 g/dL (ref 0.2–0.3)
Alpha-2-Globulin: 0.7 g/dL (ref 0.5–0.9)
Beta 2: 0.3 g/dL (ref 0.2–0.5)
Beta Globulin: 0.4 g/dL (ref 0.4–0.6)
Gamma Globulin: 0.8 g/dL (ref 0.8–1.7)
Total Protein, Serum Electrophoresis: 6.5 g/dL (ref 6.1–8.1)

## 2016-01-15 ENCOUNTER — Encounter: Payer: Self-pay | Admitting: Family Medicine

## 2016-01-16 NOTE — Progress Notes (Signed)
I reviewed health advisor's note, was available for consultation, and agree with documentation and plan.  

## 2016-01-29 ENCOUNTER — Ambulatory Visit (INDEPENDENT_AMBULATORY_CARE_PROVIDER_SITE_OTHER): Payer: Medicare Other | Admitting: Family Medicine

## 2016-01-29 ENCOUNTER — Encounter: Payer: Self-pay | Admitting: Family Medicine

## 2016-01-29 VITALS — BP 134/78 | HR 50 | Temp 97.7°F | Wt 129.0 lb

## 2016-01-29 DIAGNOSIS — D539 Nutritional anemia, unspecified: Secondary | ICD-10-CM | POA: Diagnosis not present

## 2016-01-29 NOTE — Patient Instructions (Addendum)
Blood work is looking better. I'm glad you're feeling better.  Decrease iron to 3 times weekly (Monday, Wednesday, Friday).  If starting to feel worse again - more fatigued, more weakness - let me know. Return in 4 months for lab visit only to keep an eye on blood counts.  RTC 6 mo f/u visit with me.

## 2016-01-29 NOTE — Assessment & Plan Note (Signed)
Appreciate GI eval. Improved with addition of ferrous sulfate. However iron panel not consistent with iron deficiency anemia. Normal SPEP and stable periph smear. ?MDS and offered heme referral. As improving, pt opts to continue to monitor with labs. Will change iron to MWF and I have asked her to return in 4 mo for rpt labs, 6 mo f/u OV. Pt agrees with plan.

## 2016-01-29 NOTE — Progress Notes (Signed)
BP 134/78   Pulse (!) 50   Temp 97.7 F (36.5 C) (Oral)   Wt 129 lb (58.5 kg)   BMI 25.19 kg/m    CC: f/u visit Subjective:    Patient ID: Teresa Meyer, female    DOB: 04-23-1923, 82 y.o.   MRN: [id number]  HPI: Teresa Meyer is a 82 y.o. female presenting on 01/29/2016 for Annual Exam   Saw Katha Cabal 2 wks ago for medicare wellness visit, note reviewed.   Newly found anemia - saw GI, not concerned with GI source. We started iron tablet daily which has significantly helped. She no longer feels weak/fatigued.   Drinking more water, noticing more nocturia.  Drinks 1/2 glass of wine at dinner.   Relevant past medical, surgical, family and social history reviewed and updated as indicated. Interim medical history since our last visit reviewed. Allergies and medications reviewed and updated. Current Outpatient Prescriptions on File Prior to Visit  Medication Sig  . aspirin (ASPIRIN EC) 81 MG EC tablet Take 81 mg by mouth once a week. Swallow whole.  . calcium carbonate (TUMS - DOSED IN MG ELEMENTAL CALCIUM) 500 MG chewable tablet Chew 1 tablet by mouth 2 (two) times daily with a meal.  . cholecalciferol (VITAMIN D) 1000 UNITS tablet Take 1,000 Units by mouth daily.  . diphenhydramine-acetaminophen (TYLENOL PM) 25-500 MG TABS Take 2 tablets by mouth at bedtime as needed.  . ferrous sulfate 325 (65 FE) MG tablet Take 325 mg by mouth daily with breakfast.  . hydrochlorothiazide (HYDRODIURIL) 25 MG tablet TAKE 1 TABLET DAILY  . omeprazole (PRILOSEC OTC) 20 MG tablet Take 20 mg by mouth daily.  . prednisoLONE acetate (PRED MILD) 0.12 % ophthalmic suspension Place 1 drop into the left eye daily.   . vitamin B-12 (CYANOCOBALAMIN) 1000 MCG tablet Take 1,000 mcg by mouth daily.   No current facility-administered medications on file prior to visit.    Past Medical History:  Diagnosis Date  . Arthritis   . Atypical nevi 09/2014   lentiginous dysplastic nevus with mod atypia ([place])  .  Diverticulitis   . DNR (do not resuscitate)   . Esophageal stricture 2012  . GERD (gastroesophageal reflux disease)   . HLD (hyperlipidemia)    did not tolerate statin, stopped checking  . HTN (hypertension)   . Malignant melanoma ([place]) 2015, 2017   R chin s/p excision ([place]) R upper back [name])  . Osteoporosis, post-menopausal    bisphosphonate caused dysphagia, requests defer DEXA for now  . Seasonal allergies   . Wears hearing aid     Family History  Problem Relation Age of Onset  . Pancreatic cancer Son   . Breast cancer Sister   . Lung cancer Mother   . Diabetes Other     nephew  . Stroke Neg Hx   . CAD Neg Hx     Social History  Substance Use Topics  . Smoking status: Never Smoker  . Smokeless tobacco: Never Used  . Alcohol use 0.0 oz/week     Comment: 1/2 glass wine nightly    Review of Systems Per HPI unless specifically indicated in ROS section     Objective:    BP 134/78   Pulse (!) 50   Temp 97.7 F (36.5 C) (Oral)   Wt 129 lb (58.5 kg)   BMI 25.19 kg/m   Wt Readings from Last 3 Encounters:  01/29/16 129 lb (58.5 kg)  01/12/16 127 lb 8 oz (57.8 kg)  01/01/16 130 lb 6 oz (59.1 kg)    Physical Exam  Constitutional: She appears well-developed and well-nourished. No distress.  Skin: Skin is warm and dry.  Psychiatric: She has a normal mood and affect.  Nursing note and vitals reviewed.  Results for orders placed or performed in visit on 01/12/16  CBC with Differential/Platelet  Result Value Ref Range   WBC 6.5 4.0 - 10.5 K/uL   RBC 3.04 (L) 3.87 - 5.11 Mil/uL   Hemoglobin 10.4 (L) 12.0 - 15.0 g/dL   HCT 30.3 (L) 36.0 - 46.0 %   MCV 99.6 78.0 - 100.0 fl   MCHC 34.3 30.0 - 36.0 g/dL   RDW 14.4 11.5 - 15.5 %   Platelets 299.0 150.0 - 400.0 K/uL   Neutrophils Relative % 76.4 43.0 - 77.0 %   Lymphocytes Relative 14.3 12.0 - 46.0 %   Monocytes Relative 8.0 3.0 - 12.0 %   Eosinophils Relative 0.9 0.0 - 5.0 %   Basophils Relative 0.4 0.0 - 3.0  %   Neutro Abs 4.9 1.4 - 7.7 K/uL   Lymphs Abs 0.9 0.7 - 4.0 K/uL   Monocytes Absolute 0.5 0.1 - 1.0 K/uL   Eosinophils Absolute 0.1 0.0 - 0.7 K/uL   Basophils Absolute 0.0 0.0 - 0.1 K/uL  Pathologist smear review  Result Value Ref Range   Path Review SEE NOTE   Serum protein electrophoresis with reflex  Result Value Ref Range   Total Protein, Serum Electrophoresis 6.5 6.1 - 8.1 g/dL   Albumin ELP 4.0 3.8 - 4.8 g/dL   Alpha-1-Globulin 0.3 0.2 - 0.3 g/dL   Alpha-2-Globulin 0.7 0.5 - 0.9 g/dL   Beta Globulin 0.4 0.4 - 0.6 g/dL   Beta 2 0.3 0.2 - 0.5 g/dL   Gamma Globulin 0.8 0.8 - 1.7 g/dL   Abnormal Protein Band1 NOT DET g/dL   SPE Interp. SEE NOTE    Abnormal Protein Band2 NOT DET g/dL   Abnormal Protein Band3 NOT DET g/dL  IBC panel  Result Value Ref Range   Iron 65 42 - 145 ug/dL   Transferrin 243.0 212.0 - 360.0 mg/dL   Saturation Ratios 19.1 (L) 20.0 - 50.0 %  Ferritin  Result Value Ref Range   Ferritin 74.5 10.0 - 291.0 ng/mL      Assessment & Plan:   Problem List Items Addressed This Visit    Macrocytic anemia - Primary    Appreciate GI eval. Improved with addition of ferrous sulfate. However iron panel not consistent with iron deficiency anemia. Normal SPEP and stable periph smear. ?MDS and offered heme referral. As improving, pt opts to continue to monitor with labs. Will change iron to MWF and I have asked her to return in 4 mo for rpt labs, 6 mo f/u OV. Pt agrees with plan.       Other Visit Diagnoses   None.      Follow up plan: Return in about 6 months (around 07/28/2016) for follow up visit.  [name], MD

## 2016-01-29 NOTE — Progress Notes (Signed)
Pre visit review using our clinic review tool, if applicable. No additional management support is needed unless otherwise documented below in the visit note. 

## 2016-03-19 ENCOUNTER — Encounter: Payer: Self-pay | Admitting: Family Medicine

## 2016-04-18 DIAGNOSIS — C439 Malignant melanoma of skin, unspecified: Secondary | ICD-10-CM | POA: Diagnosis present

## 2016-07-10 ENCOUNTER — Other Ambulatory Visit: Payer: Self-pay | Admitting: Family Medicine

## 2016-11-15 ENCOUNTER — Telehealth: Payer: Self-pay

## 2016-11-15 NOTE — Telephone Encounter (Signed)
I placed this in my out box this morning. Should have been faxed over today - would call to verify they received. Thanks. Ok to do PT.

## 2016-11-15 NOTE — Telephone Encounter (Signed)
Manuela Schwartz nurse with [facility] at [facility] left v/m requesting cb about status of order faxed to [name] on 11/12/16 for a PT order.Manuela Schwartz request cb.

## 2017-01-03 ENCOUNTER — Other Ambulatory Visit: Payer: Self-pay | Admitting: Family Medicine

## 2017-01-20 DIAGNOSIS — C4492 Squamous cell carcinoma of skin, unspecified: Secondary | ICD-10-CM

## 2017-01-20 HISTORY — DX: Squamous cell carcinoma of skin, unspecified: C44.92

## 2017-01-30 ENCOUNTER — Other Ambulatory Visit: Payer: Self-pay | Admitting: Family Medicine

## 2017-01-30 DIAGNOSIS — D539 Nutritional anemia, unspecified: Secondary | ICD-10-CM

## 2017-01-30 DIAGNOSIS — E7800 Pure hypercholesterolemia, unspecified: Secondary | ICD-10-CM

## 2017-02-01 ENCOUNTER — Ambulatory Visit: Payer: Medicare Other

## 2017-02-08 ENCOUNTER — Encounter: Payer: Self-pay | Admitting: Family Medicine

## 2017-02-08 ENCOUNTER — Ambulatory Visit (INDEPENDENT_AMBULATORY_CARE_PROVIDER_SITE_OTHER): Payer: Medicare Other | Admitting: Family Medicine

## 2017-02-08 ENCOUNTER — Ambulatory Visit (INDEPENDENT_AMBULATORY_CARE_PROVIDER_SITE_OTHER): Payer: Medicare Other

## 2017-02-08 VITALS — BP 120/74 | Temp 97.7°F | Ht 60.25 in | Wt 120.5 lb

## 2017-02-08 DIAGNOSIS — Z0000 Encounter for general adult medical examination without abnormal findings: Secondary | ICD-10-CM

## 2017-02-08 DIAGNOSIS — D539 Nutritional anemia, unspecified: Secondary | ICD-10-CM | POA: Diagnosis not present

## 2017-02-08 DIAGNOSIS — R011 Cardiac murmur, unspecified: Secondary | ICD-10-CM

## 2017-02-08 DIAGNOSIS — I10 Essential (primary) hypertension: Secondary | ICD-10-CM | POA: Diagnosis not present

## 2017-02-08 DIAGNOSIS — K219 Gastro-esophageal reflux disease without esophagitis: Secondary | ICD-10-CM | POA: Diagnosis not present

## 2017-02-08 DIAGNOSIS — Z66 Do not resuscitate: Secondary | ICD-10-CM | POA: Diagnosis not present

## 2017-02-08 DIAGNOSIS — M8580 Other specified disorders of bone density and structure, unspecified site: Secondary | ICD-10-CM | POA: Diagnosis not present

## 2017-02-08 DIAGNOSIS — Z23 Encounter for immunization: Secondary | ICD-10-CM | POA: Diagnosis not present

## 2017-02-08 LAB — CBC WITH DIFFERENTIAL/PLATELET
Basophils Absolute: 0 10*3/uL (ref 0.0–0.1)
Basophils Relative: 0.4 % (ref 0.0–3.0)
Eosinophils Absolute: 0.1 10*3/uL (ref 0.0–0.7)
Eosinophils Relative: 1.4 % (ref 0.0–5.0)
HCT: 37.7 % (ref 36.0–46.0)
Hemoglobin: 12.5 g/dL (ref 12.0–15.0)
Lymphocytes Relative: 16.4 % (ref 12.0–46.0)
Lymphs Abs: 1.1 10*3/uL (ref 0.7–4.0)
MCHC: 33.3 g/dL (ref 30.0–36.0)
MCV: 103.2 fl — ABNORMAL HIGH (ref 78.0–100.0)
Monocytes Absolute: 0.6 10*3/uL (ref 0.1–1.0)
Monocytes Relative: 9.6 % (ref 3.0–12.0)
Neutro Abs: 4.9 10*3/uL (ref 1.4–7.7)
Neutrophils Relative %: 72.2 % (ref 43.0–77.0)
Platelets: 304 10*3/uL (ref 150.0–400.0)
RBC: 3.66 Mil/uL — ABNORMAL LOW (ref 3.87–5.11)
RDW: 12.9 % (ref 11.5–15.5)
WBC: 6.7 10*3/uL (ref 4.0–10.5)

## 2017-02-08 LAB — TSH: TSH: 4.18 u[IU]/mL (ref 0.35–4.50)

## 2017-02-08 LAB — BASIC METABOLIC PANEL
BUN: 14 mg/dL (ref 6–23)
CO2: 29 mEq/L (ref 19–32)
Calcium: 10.4 mg/dL (ref 8.4–10.5)
Chloride: 97 mEq/L (ref 96–112)
Creatinine, Ser: 0.73 mg/dL (ref 0.40–1.20)
GFR: 79.13 mL/min (ref >60.00)
Glucose, Bld: 97 mg/dL (ref 70–99)
Potassium: 4.1 mEq/L (ref 3.5–5.1)
Sodium: 134 mEq/L — ABNORMAL LOW (ref 135–145)

## 2017-02-08 LAB — IBC PANEL
Iron: 77 ug/dL (ref 42–145)
Saturation Ratios: 26.2 % (ref 20.0–50.0)
Transferrin: 210 mg/dL — ABNORMAL LOW (ref 212.0–360.0)

## 2017-02-08 LAB — FERRITIN: Ferritin: 226.3 ng/mL (ref 10.0–291.0)

## 2017-02-08 MED ORDER — HYDROCHLOROTHIAZIDE 12.5 MG PO TABS: 12.5000 mg | ORAL_TABLET | Freq: Every day | ORAL | 3 refills | Status: DC

## 2017-02-08 NOTE — Progress Notes (Addendum)
BP 120/74 (BP Location: Left Arm, Patient Position: Sitting, Cuff Size: Normal)   Temp 97.7 F (36.5 C) (Oral)   Ht 5' 0.25" (1.53 m)   Wt 120 lb 8 oz (54.7 kg)   SpO2 98%   BMI 23.34 kg/m    CC: [facility] f/u visit Subjective:    Patient ID: Teresa Meyer, female    DOB: 01-07-1923, 83 y.o.   MRN: [id number]  HPI: GARGI BERCH is a 83 y.o. female presenting on 02/08/2017 for Annual Exam (Pt 2. Wants to discuss stopping vitamins)   Has appt with Katha Cabal after our visit today. Feeling well overall. No fevres/chills, chest pain, dyspnea, fatigue. Denies significant leg swelling.   Saw ortho for DJD of R shoulder and L knee. Treats pain with tylenol 1000mg  TID.   Last year we found anemia - saw GI, not concerned with GI source. We started iron tablet daily which significantly helped. She no longer feels weak/fatigued. Some concern for MDS - declined heme referral. Normal SPEP, stable periph smear, did not return for f/u labs.   Relevant past medical, surgical, family and social history reviewed and updated as indicated. Interim medical history since our last visit reviewed. Allergies and medications reviewed and updated. Outpatient Medications Prior to Visit  Medication Sig Dispense Refill  . calcium carbonate (TUMS - DOSED IN MG ELEMENTAL CALCIUM) 500 MG chewable tablet Chew 1 tablet by mouth 2 (two) times daily with a meal.    . cholecalciferol (VITAMIN D) 1000 UNITS tablet Take 1,000 Units by mouth daily.    . diphenhydramine-acetaminophen (TYLENOL PM) 25-500 MG TABS Take 2 tablets by mouth at bedtime as needed.    . ferrous sulfate 325 (65 FE) MG tablet Take 325 mg by mouth daily with breakfast.    . omeprazole (PRILOSEC OTC) 20 MG tablet Take 20 mg by mouth daily.    . prednisoLONE acetate (PRED MILD) 0.12 % ophthalmic suspension Place 1 drop into the left eye daily.     Marland Kitchen aspirin (ASPIRIN EC) 81 MG EC tablet Take 81 mg by mouth once a week. Swallow whole.    . hydrochlorothiazide  (HYDRODIURIL) 25 MG tablet TAKE 1 TABLET DAILY 90 tablet 0  . vitamin B-12 (CYANOCOBALAMIN) 1000 MCG tablet Take 1,000 mcg by mouth daily.     No facility-administered medications prior to visit.      Per HPI unless specifically indicated in ROS section below Review of Systems     Objective:    BP 120/74 (BP Location: Left Arm, Patient Position: Sitting, Cuff Size: Normal)   Temp 97.7 F (36.5 C) (Oral)   Ht 5' 0.25" (1.53 m)   Wt 120 lb 8 oz (54.7 kg)   SpO2 98%   BMI 23.34 kg/m   Wt Readings from Last 3 Encounters:  02/08/17 120 lb 8 oz (54.7 kg)  01/29/16 129 lb (58.5 kg)  01/12/16 127 lb 8 oz (57.8 kg)    BP Readings from Last 3 Encounters:  02/08/17 120/74  01/29/16 134/78  01/12/16 100/64    Physical Exam  Constitutional: She appears well-developed and well-nourished. No distress.  HENT:  Mouth/Throat: Oropharynx is clear and moist. No oropharyngeal exudate.  Eyes: Pupils are equal, round, and reactive to light. Conjunctivae are normal.  Neck: Normal range of motion. Neck supple. No thyromegaly present.  Cardiovascular: Normal rate, regular rhythm and intact distal pulses.   Murmur (4/6 SEM along sternal border) heard. Pulmonary/Chest: Effort normal and breath sounds normal. No respiratory  distress. She has no wheezes. She has no rales.  Abdominal: Soft. Bowel sounds are normal. She exhibits no distension and no mass. There is no tenderness. There is no rebound and no guarding.  Musculoskeletal: She exhibits no edema.  Lymphadenopathy:    She has no cervical adenopathy.  Skin: Skin is warm and dry. No rash noted.  Psychiatric: She has a normal mood and affect.  Nursing note and vitals reviewed.  Results for orders placed or performed in visit on 01/12/16  CBC with Differential/Platelet  Result Value Ref Range   WBC 6.5 4.0 - 10.5 K/uL   RBC 3.04 (L) 3.87 - 5.11 Mil/uL   Hemoglobin 10.4 (L) 12.0 - 15.0 g/dL   HCT 30.3 (L) 36.0 - 46.0 %   MCV 99.6 78.0 -  100.0 fl   MCHC 34.3 30.0 - 36.0 g/dL   RDW 14.4 11.5 - 15.5 %   Platelets 299.0 150.0 - 400.0 K/uL   Neutrophils Relative % 76.4 43.0 - 77.0 %   Lymphocytes Relative 14.3 12.0 - 46.0 %   Monocytes Relative 8.0 3.0 - 12.0 %   Eosinophils Relative 0.9 0.0 - 5.0 %   Basophils Relative 0.4 0.0 - 3.0 %   Neutro Abs 4.9 1.4 - 7.7 K/uL   Lymphs Abs 0.9 0.7 - 4.0 K/uL   Monocytes Absolute 0.5 0.1 - 1.0 K/uL   Eosinophils Absolute 0.1 0.0 - 0.7 K/uL   Basophils Absolute 0.0 0.0 - 0.1 K/uL  Pathologist smear review  Result Value Ref Range   Path Review SEE NOTE   Serum protein electrophoresis with reflex  Result Value Ref Range   Total Protein, Serum Electrophoresis 6.5 6.1 - 8.1 g/dL   Albumin ELP 4.0 3.8 - 4.8 g/dL   Alpha-1-Globulin 0.3 0.2 - 0.3 g/dL   Alpha-2-Globulin 0.7 0.5 - 0.9 g/dL   Beta Globulin 0.4 0.4 - 0.6 g/dL   Beta 2 0.3 0.2 - 0.5 g/dL   Gamma Globulin 0.8 0.8 - 1.7 g/dL   Abnormal Protein Band1 NOT DET g/dL   SPE Interp. SEE NOTE    Abnormal Protein Band2 NOT DET g/dL   Abnormal Protein Band3 NOT DET g/dL  IBC panel  Result Value Ref Range   Iron 65 42 - 145 ug/dL   Transferrin 243.0 212.0 - 360.0 mg/dL   Saturation Ratios 19.1 (L) 20.0 - 50.0 %  Ferritin  Result Value Ref Range   Ferritin 74.5 10.0 - 291.0 ng/mL      Assessment & Plan:   Problem List Items Addressed This Visit    DNR (do not resuscitate)   Essential hypertension - Primary    Chronic, stable. I think she will do just as well with lower hctz dose - will decrease to 12.5mg  daily. Sent to [facility] per pt request.       Relevant Medications   hydrochlorothiazide (HYDRODIURIL) 12.5 MG tablet   Other Relevant Orders   Basic metabolic panel   GERD    Chronic, controlled with tums and prilosec      Macrocytic anemia    Iron has helped - will update labs. ?myelodysplasia. Pt declined heme referral. Will update labs today. If stable, continue to monitor. Pt agrees with plan. Pt denies  any significant symptoms and feels well.       Relevant Medications   vitamin B-12 (CYANOCOBALAMIN) 1000 MCG tablet (Start on 02/10/2017)   Other Relevant Orders   CBC with Differential/Platelet   Osteopenia    Encouraged  continue calcium and vit D.       Systolic murmur    Chronic, stable. Asxs. Continue to monitor        Other Visit Diagnoses    Need for influenza vaccination       Relevant Orders   Flu Vaccine QUAD 6+ mos PF IM (Fluarix Quad PF) (Completed)       Follow up plan: Return in about 1 year (around 02/08/2018) for medicare wellness visit, follow up visit.  [name], MD

## 2017-02-08 NOTE — Progress Notes (Signed)
Subjective:   Teresa Meyer is a 83 y.o. female who presents for Medicare Annual (Subsequent) preventive examination.  Review of Systems:  N/A Cardiac Risk Factors include: advanced age (>56men, >35 women);dyslipidemia;hypertension     Objective:     Vitals: BP 120/74 (BP Location: Left Arm, Patient Position: Sitting, Cuff Size: Normal)   Temp 97.7 F (36.5 C) (Oral)   Ht 5' 0.25" (1.53 m)   Wt 120 lb 8 oz (54.7 kg)   SpO2 98%   BMI 23.34 kg/m   Body mass index is 23.34 kg/m.   Tobacco History  Smoking Status  . Never Smoker  Smokeless Tobacco  . Never Used     Counseling given: No   Past Medical History:  Diagnosis Date  . Arthritis   . Atypical nevi 09/2014   lentiginous dysplastic nevus with mod atypia ([place])  . Diverticulitis   . DNR (do not resuscitate)   . Esophageal stricture 2012  . GERD (gastroesophageal reflux disease)   . HLD (hyperlipidemia)    did not tolerate statin, stopped checking  . HTN (hypertension)   . Malignant melanoma ([facility]) 2015, 2017   R chin s/p excision ([place]) R upper back [name])  . Osteoporosis, post-menopausal    bisphosphonate caused dysphagia, requests defer DEXA for now  . Seasonal allergies   . Wears hearing aid    Past Surgical History:  Procedure Laterality Date  . BUNIONECTOMY Right 2001  . CARPAL TUNNEL RELEASE Bilateral 1974  . CATARACT EXTRACTION Bilateral    [place]  . TONSILLECTOMY  1928   Family History  Problem Relation Age of Onset  . Pancreatic cancer Son   . Breast cancer Sister   . Lung cancer Mother   . Diabetes Other        nephew  . Stroke Neg Hx   . CAD Neg Hx    History  Sexual Activity  . Sexual activity: No    Outpatient Encounter Prescriptions as of 02/08/2017  Medication Sig  . calcium carbonate (TUMS - DOSED IN MG ELEMENTAL CALCIUM) 500 MG chewable tablet Chew 1 tablet by mouth 2 (two) times daily with a meal.  . cholecalciferol (VITAMIN D) 1000 UNITS tablet Take 1,000  Units by mouth daily.  . diphenhydramine-acetaminophen (TYLENOL PM) 25-500 MG TABS Take 2 tablets by mouth at bedtime as needed.  . ferrous sulfate 325 (65 FE) MG tablet Take 325 mg by mouth daily with breakfast.  . hydrochlorothiazide (HYDRODIURIL) 12.5 MG tablet Take 1 tablet (12.5 mg total) by mouth daily.  Marland Kitchen omeprazole (PRILOSEC OTC) 20 MG tablet Take 20 mg by mouth daily.  . prednisoLONE acetate (PRED MILD) 0.12 % ophthalmic suspension Place 1 drop into the left eye daily.   Derrill Memo ON 02/10/2017] vitamin B-12 (CYANOCOBALAMIN) 1000 MCG tablet Take 1 tablet (1,000 mcg total) by mouth 2 (two) times a week.   No facility-administered encounter medications on file as of 02/08/2017.     Activities of Daily Living In your present state of health, do you have any difficulty performing the following activities: 02/08/2017  Hearing? Y  Vision? N  Difficulty concentrating or making decisions? N  Walking or climbing stairs? Y  Dressing or bathing? N  Doing errands, shopping? Y  Preparing Food and eating ? N  Using the Toilet? N  In the past six months, have you accidently leaked urine? N  Do you have problems with loss of bowel control? N  Managing your Medications? N  Managing your  Finances? Y  Housekeeping or managing your Housekeeping? Y  Some recent data might be hidden    Patient Care Team: [name], MD as PCP - General (Family Medicine) Rolm Bookbinder, MD as Consulting Physician (Dermatology) [name], MD as Consulting Physician (Orthopedic Surgery) [name], MD as Referring Physician (Ophthalmology) Kipp Laurence, MD as Referring Physician (Audiology) Wandra Mannan, DMD as Referring Physician (Dentistry)    Assessment:    Hearing Screening Comments: Bilateral hearing aids Vision Screening Comments: Last vision exam in 2018  Exercise Activities and Dietary recommendations Current Exercise Habits: The patient does not participate in regular  exercise at present, Exercise limited by: orthopedic condition(s)  Goals    . health management          Starting 02/08/2017, I will continue to take medication as prescribed.      Fall Risk Fall Risk  02/08/2017 01/12/2016 01/10/2015 09/24/2013  Falls in the past year? No No Yes No  Number falls in past yr: - - 1 -   Depression Screen PHQ 2/9 Scores 02/08/2017 01/12/2016 01/10/2015 09/24/2013  PHQ - 2 Score 0 0 0 0  PHQ- 9 Score 0 - - -     Cognitive Function MMSE - Mini Mental State Exam 02/08/2017 01/12/2016  Orientation to time 5 5  Orientation to Place 5 5  Registration 3 3  Attention/ Calculation 0 0  Recall 2 2  Recall-comments pt was unable to recall 1 of 3 words pt was unable to recall 1 of 3 words  Language- name 2 objects 0 0  Language- repeat 1 1  Language- follow 3 step command 3 3  Language- read & follow direction 0 0  Write a sentence 0 0  Copy design 0 0  Total score 19 19     PLEASE NOTE: A Mini-Cog screen was completed. Maximum score is 20. A value of 0 denotes this part of Folstein MMSE was not completed or the patient failed this part of the Mini-Cog screening.   Mini-Cog Screening Orientation to Time - Max 5 pts Orientation to Place - Max 5 pts Registration - Max 3 pts Recall - Max 3 pts Language Repeat - Max 1 pts Language Follow 3 Step Command - Max 3 pts     Immunization History  Administered Date(s) Administered  . Influenza,inj,Quad PF,6+ Mos 03/27/2013, 02/08/2017  . Influenza-Unspecified 02/21/2014   Screening Tests Health Maintenance  Topic Date Due  . PNA vac Low Risk Adult (1 of 2 - PCV13) 08/21/2017 (Originally 06/03/1989)  . DTaP/Tdap/Td (1 - Tdap) 02/09/2027 (Originally 06/04/1941)  . TETANUS/TDAP  02/09/2027 (Originally 06/04/1941)  . INFLUENZA VACCINE  Completed  . DEXA SCAN  Completed      Plan:     I have personally reviewed and addressed the Medicare Annual Wellness questionnaire and have noted the following in the patient's  chart:  A. Medical and social history B. Use of alcohol, tobacco or illicit drugs  C. Current medications and supplements D. Functional ability and status E.  Nutritional status F.  Physical activity G. Advance directives H. List of other physicians I.  Hospitalizations, surgeries, and ER visits in previous 12 months J.  [place] to include hearing, vision, cognitive, depression L. Referrals and appointments - none  In addition, I have reviewed and discussed with patient certain preventive protocols, quality metrics, and best practice recommendations. A written personalized care plan for preventive services as well as general preventive health recommendations were provided to  patient.  See attached scanned questionnaire for additional information.   Signed,   Lindell Noe, MHA, BS, LPN Health Coach

## 2017-02-08 NOTE — Assessment & Plan Note (Signed)
Chronic, stable. Asxs. Continue to monitor.  

## 2017-02-08 NOTE — Assessment & Plan Note (Signed)
Iron has helped - will update labs. ?myelodysplasia. Pt declined heme referral. Will update labs today. If stable, continue to monitor. Pt agrees with plan. Pt denies any significant symptoms and feels well.

## 2017-02-08 NOTE — Assessment & Plan Note (Signed)
Encouraged continue calcium and vit D.

## 2017-02-08 NOTE — Progress Notes (Signed)
PCP notes:   Health maintenance:  Tetanus- postponed/insurance PNA vaccines - postponed Flu vaccine - administered at [facility] with PCP  Abnormal screenings:   Mini-Cog score: 19/20  Patient concerns:   None  Nurse concerns:  None  Next PCP appt:   N/A

## 2017-02-08 NOTE — Assessment & Plan Note (Signed)
Chronic, stable. I think she will do just as well with lower hctz dose - will decrease to 12.5mg  daily. [facility].

## 2017-02-08 NOTE — Progress Notes (Signed)
Pre visit review using our clinic review tool, if applicable. No additional management support is needed unless otherwise documented below in the visit note. 

## 2017-02-08 NOTE — Addendum Note (Signed)
Addended by: [name] on: 02/08/2017 01:53 PM   Modules accepted: Orders

## 2017-02-08 NOTE — Patient Instructions (Addendum)
You are doing well today. See Katha Cabal for wellness visit today.  Flu shot today. Labs today.  Ok to stop aspirin.  Decrease hydrochlorothiazide to 12.5mg  daily (1/2 tablet). [facility]  Return as needed or in 1 year for next visit.

## 2017-02-08 NOTE — Patient Instructions (Signed)
Teresa Meyer , Thank you for taking time to come for your Medicare Wellness Visit. I appreciate your ongoing commitment to your health goals. Please review the following plan we discussed and let me know if I can assist you in the future.   These are the goals we discussed: Goals    . health management          Starting 02/08/2017, I will continue to take medication as prescribed.       This is a list of the screening recommended for you and due dates:  Health Maintenance  Topic Date Due  . Pneumonia vaccines (1 of 2 - PCV13) 08/21/2017*  . DTaP/Tdap/Td vaccine (1 - Tdap) 02/09/2027*  . Tetanus Vaccine  02/09/2027*  . Flu Shot  Completed  . DEXA scan (bone density measurement)  Completed  *Topic was postponed. The date shown is not the original due date.   Preventive Care for Adults  A healthy lifestyle and preventive care can promote health and wellness. Preventive health guidelines for adults include the following key practices.  . A routine yearly physical is a good way to check with your health care provider about your health and preventive screening. It is a chance to share any concerns and updates on your health and to receive a thorough exam.  . Visit your dentist for a routine exam and preventive care every 6 months. Brush your teeth twice a day and floss once a day. Good oral hygiene prevents tooth decay and gum disease.  . The frequency of eye exams is based on your age, health, family medical history, use  of contact lenses, and other factors. Follow your health care provider's ecommendations for frequency of eye exams.  . Eat a healthy diet. Foods like vegetables, fruits, whole grains, low-fat dairy products, and lean protein foods contain the nutrients you need without too many calories. Decrease your intake of foods high in solid fats, added sugars, and salt. Eat the right amount of calories for you. Get information about a proper diet from your health care provider, if  necessary.  . Regular physical exercise is one of the most important things you can do for your health. Most adults should get at least 150 minutes of moderate-intensity exercise (any activity that increases your heart rate and causes you to sweat) each week. In addition, most adults need muscle-strengthening exercises on 2 or more days a week.  Silver Sneakers may be a benefit available to you. To determine eligibility, you may visit the website: www.silversneakers.com or contact program at 512-661-7269 Mon-Fri between 8AM-8PM.   . Maintain a healthy weight. The body mass index (BMI) is a screening tool to identify possible weight problems. It provides an estimate of body fat based on height and weight. Your health care provider can find your BMI and can help you achieve or maintain a healthy weight.   For adults 20 years and older: ? A BMI below 18.5 is considered underweight. ? A BMI of 18.5 to 24.9 is normal. ? A BMI of 25 to 29.9 is considered overweight. ? A BMI of 30 and above is considered obese.   . Maintain normal blood lipids and cholesterol levels by exercising and minimizing your intake of saturated fat. Eat a balanced diet with plenty of fruit and vegetables. Blood tests for lipids and cholesterol should begin at age 60 and be repeated every 5 years. If your lipid or cholesterol levels are high, you are over 50, or you are at  high risk for heart disease, you may need your cholesterol levels checked more frequently. Ongoing high lipid and cholesterol levels should be treated with medicines if diet and exercise are not working.  . If you smoke, find out from your health care provider how to quit. If you do not use tobacco, please do not start.  . If you choose to drink alcohol, please do not consume more than 2 drinks per day. One drink is considered to be 12 ounces (355 mL) of beer, 5 ounces (148 mL) of wine, or 1.5 ounces (44 mL) of liquor.  . If you are 37-25 years old, ask your  health care provider if you should take aspirin to prevent strokes.  . Use sunscreen. Apply sunscreen liberally and repeatedly throughout the day. You should seek shade when your shadow is shorter than you. Protect yourself by wearing long sleeves, pants, a wide-brimmed hat, and sunglasses year round, whenever you are outdoors.  . Once a month, do a whole body skin exam, using a mirror to look at the skin on your back. Tell your health care provider of new moles, moles that have irregular borders, moles that are larger than a pencil eraser, or moles that have changed in shape or color.

## 2017-02-08 NOTE — Progress Notes (Signed)
I reviewed health advisor's note, was available for consultation, and agree with documentation and plan.  

## 2017-02-08 NOTE — Addendum Note (Signed)
Addended by: [name] on: 02/08/2017 12:17 PM   Modules accepted: Orders

## 2017-02-08 NOTE — Assessment & Plan Note (Signed)
Chronic, controlled with tums and prilosec

## 2017-02-10 ENCOUNTER — Ambulatory Visit: Payer: Medicare Other

## 2017-02-12 ENCOUNTER — Other Ambulatory Visit: Payer: Self-pay | Admitting: Family Medicine

## 2017-02-12 DIAGNOSIS — D539 Nutritional anemia, unspecified: Secondary | ICD-10-CM

## 2017-02-12 MED ORDER — FERROUS SULFATE 325 (65 FE) MG PO TABS: 325.0000 mg | ORAL_TABLET | ORAL | Status: DC

## 2017-02-12 NOTE — Assessment & Plan Note (Signed)
Anemia resolved with iron 2018, macrocytosis remains (normal B12, folate, no alcohol intake) - possible mild myelodysplasia.

## 2017-08-12 DIAGNOSIS — C4491 Basal cell carcinoma of skin, unspecified: Secondary | ICD-10-CM

## 2017-08-12 HISTORY — DX: Basal cell carcinoma of skin, unspecified: C44.91

## 2017-09-19 ENCOUNTER — Telehealth: Payer: Self-pay

## 2017-09-19 NOTE — Telephone Encounter (Signed)
I spoke with pt; pt is not having SOB; pt has some swelling in legs since diuretic was decreased but pt wants appt to see her legs. No pain. Pt scheduled appt with [name] on 09/20/17 at 11:15. FYI to DR G.

## 2017-09-19 NOTE — Telephone Encounter (Signed)
Copied from [place] [id number]. Topic: Appointment Scheduling - Scheduling Inquiry for Clinic >> Sep 19, 2017  1:26 PM Teresa Meyer A wrote: Reason for CRM: Pt called and stated that she would like to know if someone can get her in today or tomorrow.  Stated she was getting a little fluid in her legs and would like to know if she could come in around noon today or tomorrow?

## 2017-09-20 ENCOUNTER — Ambulatory Visit (INDEPENDENT_AMBULATORY_CARE_PROVIDER_SITE_OTHER): Payer: Medicare Other | Admitting: Family Medicine

## 2017-09-20 ENCOUNTER — Encounter: Payer: Self-pay | Admitting: Family Medicine

## 2017-09-20 VITALS — BP 154/74 | HR 74 | Temp 98.4°F | Ht 60.25 in | Wt 130.5 lb

## 2017-09-20 DIAGNOSIS — I10 Essential (primary) hypertension: Secondary | ICD-10-CM | POA: Diagnosis not present

## 2017-09-20 DIAGNOSIS — R600 Localized edema: Secondary | ICD-10-CM | POA: Diagnosis not present

## 2017-09-20 MED ORDER — HYDROCHLOROTHIAZIDE 25 MG PO TABS: 25.0000 mg | ORAL_TABLET | Freq: Every day | ORAL | 1 refills | Status: DC

## 2017-09-20 NOTE — Assessment & Plan Note (Signed)
Chronic, deteriorated. Increase hctz to 25mg  daily. RTC 1 mo f/u visit.

## 2017-09-20 NOTE — Assessment & Plan Note (Addendum)
Significant deterioration noted, along with thicker blistering RLE. Anticipate worsening from uncontrolled hypertension. Will increase HCTZ to 25mg  daily, reviewed further supportive care as per instructions. Provided with low dose compression stockings. Update if not improving with treatment, RTC 3-4 wks f/u. Consider further eval with labs next visit.

## 2017-09-20 NOTE — Progress Notes (Signed)
BP (!) 154/74 (BP Location: Right Arm, Cuff Size: Normal)   Pulse 74   Temp 98.4 F (36.9 C) (Oral)   Ht 5' 0.25" (1.53 m)   Wt 130 lb 8 oz (59.2 kg)   SpO2 99%   BMI 25.28 kg/m    CC: leg swelling Subjective:    Patient ID: Teresa Meyer, female    DOB: February 23, 1923, 84 y.o.   MRN: [id number]  HPI: Teresa Meyer is a 84 y.o. female presenting on 09/20/2017 for Leg Swelling (C/o swelling in bilateral lower LE. Denies any pain but legs are tender to touch. Pt is accompanied by her nurse, Tye Maryland.)   Several month h/o worsening leg swelling, worse in the past month. She thinks this is worse since we lowered thiazide dose.   Better when she awakens. Feels she's staying well hydrated.  Working on elevating legs at home.  Denies significant salt/sodium use.  Has not used compression stockings Denies chest pain/tightness, dyspnea, HA or palpitations.   BP elevated in office today. She doesn't have BP cuff at home.   Ongoing L knee pain from chronic OA.   Relevant past medical, surgical, family and social history reviewed and updated as indicated. Interim medical history since our last visit reviewed. Allergies and medications reviewed and updated. Outpatient Medications Prior to Visit  Medication Sig Dispense Refill  . calcium carbonate (TUMS - DOSED IN MG ELEMENTAL CALCIUM) 500 MG chewable tablet Chew 1 tablet by mouth 2 (two) times daily with a meal.    . cholecalciferol (VITAMIN D) 1000 UNITS tablet Take 1,000 Units by mouth daily.    . diphenhydramine-acetaminophen (TYLENOL PM) 25-500 MG TABS Take 2 tablets by mouth at bedtime as needed.    . ferrous sulfate 325 (65 FE) MG tablet Take 1 tablet (325 mg total) by mouth once a week.    Marland Kitchen omeprazole (PRILOSEC OTC) 20 MG tablet Take 20 mg by mouth daily.    . prednisoLONE acetate (PRED MILD) 0.12 % ophthalmic suspension Place 1 drop into the left eye daily.     . vitamin B-12 (CYANOCOBALAMIN) 1000 MCG tablet Take 1 tablet (1,000 mcg  total) by mouth 2 (two) times a week.    . hydrochlorothiazide (HYDRODIURIL) 12.5 MG tablet Take 1 tablet (12.5 mg total) by mouth daily. 90 tablet 3   No facility-administered medications prior to visit.      Per HPI unless specifically indicated in ROS section below Review of Systems     Objective:    BP (!) 154/74 (BP Location: Right Arm, Cuff Size: Normal)   Pulse 74   Temp 98.4 F (36.9 C) (Oral)   Ht 5' 0.25" (1.53 m)   Wt 130 lb 8 oz (59.2 kg)   SpO2 99%   BMI 25.28 kg/m   Wt Readings from Last 3 Encounters:  09/20/17 130 lb 8 oz (59.2 kg)  02/08/17 120 lb 8 oz (54.7 kg)  02/08/17 120 lb 8 oz (54.7 kg)    Physical Exam  Constitutional: She appears well-developed and well-nourished. No distress.  HENT:  Mouth/Throat: Oropharynx is clear and moist. No oropharyngeal exudate.  Cardiovascular: Normal rate, regular rhythm and normal heart sounds.  No murmur heard. Pulmonary/Chest: Effort normal and breath sounds normal. No respiratory distress. She has no wheezes. She has no rales.  Musculoskeletal: She exhibits edema (tender, 1+ pitting to knees bilaterallyl).  2+ DP bilaterally Diffuse L knee pain from chronic L knee osteoarthritis, brace in place  Skin: No  erythema.  Thick blistering of RLE without significant erythema or induration  Nursing note and vitals reviewed.  Results for orders placed or performed in visit on 78/29/56  Basic metabolic panel  Result Value Ref Range   Sodium 134 (L) 135 - 145 mEq/L   Potassium 4.1 3.5 - 5.1 mEq/L   Chloride 97 96 - 112 mEq/L   CO2 29 19 - 32 mEq/L   Glucose, Bld 97 70 - 99 mg/dL   BUN 14 6 - 23 mg/dL   Creatinine, Ser 0.73 0.40 - 1.20 mg/dL   Calcium 10.4 8.4 - 10.5 mg/dL   GFR 79.13 >60.00 mL/min  CBC with Differential/Platelet  Result Value Ref Range   WBC 6.7 4.0 - 10.5 K/uL   RBC 3.66 (L) 3.87 - 5.11 Mil/uL   Hemoglobin 12.5 12.0 - 15.0 g/dL   HCT 37.7 36.0 - 46.0 %   MCV 103.2 (H) 78.0 - 100.0 fl   MCHC 33.3  30.0 - 36.0 g/dL   RDW 12.9 11.5 - 15.5 %   Platelets 304.0 150.0 - 400.0 K/uL   Neutrophils Relative % 72.2 43.0 - 77.0 %   Lymphocytes Relative 16.4 12.0 - 46.0 %   Monocytes Relative 9.6 3.0 - 12.0 %   Eosinophils Relative 1.4 0.0 - 5.0 %   Basophils Relative 0.4 0.0 - 3.0 %   Neutro Abs 4.9 1.4 - 7.7 K/uL   Lymphs Abs 1.1 0.7 - 4.0 K/uL   Monocytes Absolute 0.6 0.1 - 1.0 K/uL   Eosinophils Absolute 0.1 0.0 - 0.7 K/uL   Basophils Absolute 0.0 0.0 - 0.1 K/uL  TSH  Result Value Ref Range   TSH 4.18 0.35 - 4.50 uIU/mL  Ferritin  Result Value Ref Range   Ferritin 226.3 10.0 - 291.0 ng/mL  IBC panel  Result Value Ref Range   Iron 77 42 - 145 ug/dL   Transferrin 210.0 (L) 212.0 - 360.0 mg/dL   Saturation Ratios 26.2 20.0 - 50.0 %      Assessment & Plan:   Problem List Items Addressed This Visit    Essential hypertension    Chronic, deteriorated. Increase hctz to 25mg  daily. RTC 1 mo f/u visit.       Relevant Medications   hydrochlorothiazide (HYDRODIURIL) 25 MG tablet   Pedal edema - Primary    Significant deterioration noted, along with thicker blistering RLE. Anticipate worsening from uncontrolled hypertension. Will increase HCTZ to 25mg  daily, reviewed further supportive care as per instructions. Provided with low dose compression stockings. Update if not improving with treatment, RTC 3-4 wks f/u. Consider further eval with labs next visit.           Meds ordered this encounter  Medications  . hydrochlorothiazide (HYDRODIURIL) 25 MG tablet    Sig: Take 1 tablet (25 mg total) by mouth daily.    Dispense:  90 tablet    Refill:  1    Note new sig   No orders of the defined types were placed in this encounter.   Follow up plan: Return in about 1 month (around 10/18/2017) for follow up visit.  [name], MD

## 2017-09-20 NOTE — Patient Instructions (Addendum)
Blood pressure is a bit high today - start monitoring more closely at home 1-2 times a week Increase hydrochlorothiazide back to 25mg  daily.  Start using low dose compression stockings. Limit salt/sodium to 1.5gm/day Make sure you stay well hydrated. Return in 3-4 weeks for recheck.

## 2017-10-10 ENCOUNTER — Ambulatory Visit (INDEPENDENT_AMBULATORY_CARE_PROVIDER_SITE_OTHER): Payer: Medicare Other | Admitting: Family Medicine

## 2017-10-10 ENCOUNTER — Telehealth: Payer: Self-pay | Admitting: Family Medicine

## 2017-10-10 ENCOUNTER — Encounter: Payer: Self-pay | Admitting: Family Medicine

## 2017-10-10 VITALS — BP 124/62 | HR 81 | Temp 98.4°F | Ht 60.0 in | Wt 125.8 lb

## 2017-10-10 DIAGNOSIS — E871 Hypo-osmolality and hyponatremia: Secondary | ICD-10-CM | POA: Diagnosis not present

## 2017-10-10 DIAGNOSIS — K222 Esophageal obstruction: Secondary | ICD-10-CM | POA: Diagnosis not present

## 2017-10-10 DIAGNOSIS — R1013 Epigastric pain: Secondary | ICD-10-CM | POA: Diagnosis not present

## 2017-10-10 DIAGNOSIS — K219 Gastro-esophageal reflux disease without esophagitis: Secondary | ICD-10-CM | POA: Diagnosis not present

## 2017-10-10 DIAGNOSIS — R600 Localized edema: Secondary | ICD-10-CM | POA: Diagnosis not present

## 2017-10-10 DIAGNOSIS — D539 Nutritional anemia, unspecified: Secondary | ICD-10-CM

## 2017-10-10 DIAGNOSIS — I10 Essential (primary) hypertension: Secondary | ICD-10-CM

## 2017-10-10 LAB — COMPREHENSIVE METABOLIC PANEL
ALT: 8 U/L (ref 0–35)
AST: 15 U/L (ref 0–37)
Albumin: 3.8 g/dL (ref 3.5–5.2)
Alkaline Phosphatase: 40 U/L (ref 39–117)
BUN: 13 mg/dL (ref 6–23)
CO2: 27 mEq/L (ref 19–32)
Calcium: 9.5 mg/dL (ref 8.4–10.5)
Chloride: 93 mEq/L — ABNORMAL LOW (ref 96–112)
Creatinine, Ser: 0.89 mg/dL (ref 0.40–1.20)
GFR: 62.86 mL/min (ref >60.00)
Glucose, Bld: 119 mg/dL — ABNORMAL HIGH (ref 70–99)
Potassium: 3.6 mEq/L (ref 3.5–5.1)
Sodium: 129 mEq/L — ABNORMAL LOW (ref 135–145)
Total Bilirubin: 0.3 mg/dL (ref 0.2–1.2)
Total Protein: 6.6 g/dL (ref 6.0–8.3)

## 2017-10-10 LAB — CBC WITH DIFFERENTIAL/PLATELET
Basophils Absolute: 0 10*3/uL (ref 0.0–0.1)
Basophils Relative: 0.6 % (ref 0.0–3.0)
Eosinophils Absolute: 0.1 10*3/uL (ref 0.0–0.7)
Eosinophils Relative: 1.1 % (ref 0.0–5.0)
HCT: 19.3 % — CL (ref 36.0–46.0)
Hemoglobin: 6.6 g/dL — CL (ref 12.0–15.0)
Lymphocytes Relative: 12.1 % (ref 12.0–46.0)
Lymphs Abs: 0.9 10*3/uL (ref 0.7–4.0)
MCHC: 33.9 g/dL (ref 30.0–36.0)
MCV: 95.7 fl (ref 78.0–100.0)
Monocytes Absolute: 0.7 10*3/uL (ref 0.1–1.0)
Monocytes Relative: 9.8 % (ref 3.0–12.0)
Neutro Abs: 5.6 10*3/uL (ref 1.4–7.7)
Neutrophils Relative %: 76.4 % (ref 43.0–77.0)
Platelets: 430 10*3/uL — ABNORMAL HIGH (ref 150.0–400.0)
RBC: 2.02 Mil/uL — ABNORMAL LOW (ref 3.87–5.11)
RDW: 16 % — ABNORMAL HIGH (ref 11.5–15.5)
WBC: 7.3 10*3/uL (ref 4.0–10.5)

## 2017-10-10 LAB — LIPASE: Lipase: 24 U/L (ref 11.0–59.0)

## 2017-10-10 MED ORDER — OMEPRAZOLE 40 MG PO CPDR: 40.0000 mg | DELAYED_RELEASE_CAPSULE | Freq: Every day | ORAL | 1 refills | Status: DC

## 2017-10-10 NOTE — Progress Notes (Signed)
BP 124/62 (BP Location: Right Arm, Patient Position: Sitting, Cuff Size: Normal)   Pulse 81   Temp 98.4 F (36.9 C) (Oral)   Ht 5' (1.524 m)   Wt 125 lb 12 oz (57 kg)   SpO2 97%   BMI 24.56 kg/m    CC: 3 wk f/u visit Subjective:    Patient ID: Teresa Meyer, female    DOB: 1922/10/27, 84 y.o.   MRN: [id number]  HPI: Teresa Meyer is a 84 y.o. female presenting on 10/10/2017 for Edema (Here for 3-4 wk f/u for lower bilateral LE edema. States swelling has improved.  Pt is accompanined by her aide, [name].); GI Problem (C/o indigestion and vomiting for last 3 wks. ); and Chills (C/o getting cold chill everyday about 3 PM across her shoulders. )   See prior note for details. Seen here last month with progressive pedal edema with elevated blood pressures worse after lowering hctz dose. We increased hctz to 25mg  daily and recommended starting low dose compression stockings. With this she notes significant improvement in pedal edema.   Increasing indigestion with vomiting over last 2-3 wks. Weight loss noted as well (5 lbs). Increasing indigestion, gassiness, bloating. Mild early satiety. No diet changes, med or supplement changes. Last few nights has slept on an incline.   Denies fevers/chills, dyspnea, cough, abd pain. No dysphagia, no blood in vomit or stool. No diarrhea or constipation.   She has been taking 2 advil at 10am daily. Takes tylenol PM at night time.  Minimal caffeine and alcohol.   Requests we contact daughter with results.   Relevant past medical, surgical, family and social history reviewed and updated as indicated. Interim medical history since our last visit reviewed. Allergies and medications reviewed and updated. Outpatient Medications Prior to Visit  Medication Sig Dispense Refill  . calcium carbonate (TUMS - DOSED IN MG ELEMENTAL CALCIUM) 500 MG chewable tablet Chew 1 tablet by mouth 2 (two) times daily with a meal.    . cholecalciferol (VITAMIN D) 1000 UNITS  tablet Take 1,000 Units by mouth daily.    . diphenhydramine-acetaminophen (TYLENOL PM) 25-500 MG TABS Take 2 tablets by mouth at bedtime as needed.    . ferrous sulfate 325 (65 FE) MG tablet Take 1 tablet (325 mg total) by mouth once a week.    . hydrochlorothiazide (HYDRODIURIL) 25 MG tablet Take 1 tablet (25 mg total) by mouth daily. 90 tablet 1  . prednisoLONE acetate (PRED MILD) 0.12 % ophthalmic suspension Place 1 drop into the left eye daily.     . vitamin B-12 (CYANOCOBALAMIN) 1000 MCG tablet Take 1 tablet (1,000 mcg total) by mouth 2 (two) times a week.    Marland Kitchen omeprazole (PRILOSEC OTC) 20 MG tablet Take 20 mg by mouth daily.     No facility-administered medications prior to visit.      Per HPI unless specifically indicated in ROS section below Review of Systems     Objective:    BP 124/62 (BP Location: Right Arm, Patient Position: Sitting, Cuff Size: Normal)   Pulse 81   Temp 98.4 F (36.9 C) (Oral)   Ht 5' (1.524 m)   Wt 125 lb 12 oz (57 kg)   SpO2 97%   BMI 24.56 kg/m   Wt Readings from Last 3 Encounters:  10/10/17 125 lb 12 oz (57 kg)  09/20/17 130 lb 8 oz (59.2 kg)  02/08/17 120 lb 8 oz (54.7 kg)    Physical Exam  Constitutional:  She appears well-developed and well-nourished. No distress.  HENT:  Mouth/Throat: Oropharynx is clear and moist. No oropharyngeal exudate.  Cardiovascular: Normal rate and regular rhythm.  Murmur (3/6 systolic) heard. Pulmonary/Chest: Breath sounds normal. No respiratory distress. She has no wheezes. She has no rales.  Musculoskeletal: She exhibits edema (1+).  Healing scab R inner lower leg Compression stockings in place Improved edema  Skin: Skin is warm and dry. There is pallor.  Psychiatric: She has a normal mood and affect.  Nursing note and vitals reviewed.  Results for orders placed or performed in visit on 29/47/65  Basic metabolic panel  Result Value Ref Range   Sodium 134 (L) 135 - 145 mEq/L   Potassium 4.1 3.5 - 5.1  mEq/L   Chloride 97 96 - 112 mEq/L   CO2 29 19 - 32 mEq/L   Glucose, Bld 97 70 - 99 mg/dL   BUN 14 6 - 23 mg/dL   Creatinine, Ser 0.73 0.40 - 1.20 mg/dL   Calcium 10.4 8.4 - 10.5 mg/dL   GFR 79.13 >60.00 mL/min  CBC with Differential/Platelet  Result Value Ref Range   WBC 6.7 4.0 - 10.5 K/uL   RBC 3.66 (L) 3.87 - 5.11 Mil/uL   Hemoglobin 12.5 12.0 - 15.0 g/dL   HCT 37.7 36.0 - 46.0 %   MCV 103.2 (H) 78.0 - 100.0 fl   MCHC 33.3 30.0 - 36.0 g/dL   RDW 12.9 11.5 - 15.5 %   Platelets 304.0 150.0 - 400.0 K/uL   Neutrophils Relative % 72.2 43.0 - 77.0 %   Lymphocytes Relative 16.4 12.0 - 46.0 %   Monocytes Relative 9.6 3.0 - 12.0 %   Eosinophils Relative 1.4 0.0 - 5.0 %   Basophils Relative 0.4 0.0 - 3.0 %   Neutro Abs 4.9 1.4 - 7.7 K/uL   Lymphs Abs 1.1 0.7 - 4.0 K/uL   Monocytes Absolute 0.6 0.1 - 1.0 K/uL   Eosinophils Absolute 0.1 0.0 - 0.7 K/uL   Basophils Absolute 0.0 0.0 - 0.1 K/uL  TSH  Result Value Ref Range   TSH 4.18 0.35 - 4.50 uIU/mL  Ferritin  Result Value Ref Range   Ferritin 226.3 10.0 - 291.0 ng/mL  IBC panel  Result Value Ref Range   Iron 77 42 - 145 ug/dL   Transferrin 210.0 (L) 212.0 - 360.0 mg/dL   Saturation Ratios 26.2 20.0 - 50.0 %      Assessment & Plan:   Problem List Items Addressed This Visit    Epigastric discomfort - Primary    New over last 2-3 wks, associated with nausea/vomiting and worsening GERD. Anticipate combination of advil use, reviewed dietary modification to improve symptoms. Start omeprazole 40mg  daily (has not been taking OTC prilosec) and update with effect. Check for other causes. Pt agrees with plan.      Relevant Orders   Comprehensive metabolic panel   CBC with Differential/Platelet   Lipase   ESOPHAGEAL STRICTURE    H/o this.      Essential hypertension    Improved control on higher HCTZ dose - continue. Check labwork today.  Hold urine sample to run tomorrow if electrolyte disturbance found (namely hyponatremia)        GERD    It seems she has not been taking prilosec. See above Will start omeprazole 40mg  daily x 3 wks then PRN.      Relevant Medications   omeprazole (PRILOSEC) 40 MG capsule   Pedal edema    Improved  with higher hctz dose and regular compression stockings.          Meds ordered this encounter  Medications  . omeprazole (PRILOSEC) 40 MG capsule    Sig: Take 1 capsule (40 mg total) by mouth daily. Take 30 min before a meal    Dispense:  30 capsule    Refill:  1   Orders Placed This Encounter  Procedures  . Comprehensive metabolic panel  . CBC with Differential/Platelet  . Lipase    Follow up plan: Return if symptoms worsen or fail to improve.  [name], MD

## 2017-10-10 NOTE — Patient Instructions (Addendum)
I think stomach pain is coming from worsening heartburn/indigestion.  Stop advil, take tylenol 500mg  2 tablets in the morning and tylenol PM 2 tablets at night.  Caution with alcohol, caffeine, spicy foods.  Start omeprazole 40mg  daily for 3 weeks then as needed.  Labs and urine today.

## 2017-10-10 NOTE — Telephone Encounter (Signed)
plz call - preliminary lab result shows new severe anemia, unclear cause. Recommend omeprazole 1m daily as prescribed, start OTC iron tablet daily, I'd like her to return to pick up stool kit to check for hidden blood in stool. Will be in touch with final blood work results.  If chest pain, shortness of breath, dizziness, passing out, needs urgent evaluation.

## 2017-10-10 NOTE — Telephone Encounter (Signed)
[facility] called with critical lab results. Hemoglobin 6.6 Hematocrit 19.6 Results given to Dr.Gutierrez at 4:50pm. Lendon Collar, RTR

## 2017-10-10 NOTE — Assessment & Plan Note (Signed)
Improved with higher hctz dose and regular compression stockings.

## 2017-10-10 NOTE — Assessment & Plan Note (Addendum)
Improved control on higher HCTZ dose - continue. Check labwork today.  Hold urine sample to run tomorrow if electrolyte disturbance found (namely hyponatremia)

## 2017-10-10 NOTE — Assessment & Plan Note (Signed)
It seems she has not been taking prilosec. See above Will start omeprazole 40mg  daily x 3 wks then PRN.

## 2017-10-10 NOTE — Assessment & Plan Note (Signed)
New over last 2-3 wks, associated with nausea/vomiting and worsening GERD. Anticipate combination of advil use, reviewed dietary modification to improve symptoms. Start omeprazole 40mg  daily (has not been taking OTC prilosec) and update with effect. Check for other causes. Pt agrees with plan.

## 2017-10-10 NOTE — Assessment & Plan Note (Signed)
H/o this.  ?

## 2017-10-10 NOTE — Telephone Encounter (Signed)
Spoke with pt relaying results, instructions and message per Dr. Darnell Level.  Pt verbalizes understanding and will have stool kit picked up.

## 2017-10-11 ENCOUNTER — Inpatient Hospital Stay
Admission: EM | Admit: 2017-10-11 | Discharge: 2017-10-14 | DRG: 184 | Disposition: A | Discharge status: Skilled Nursing Facility | Payer: Medicare Other | Attending: Internal Medicine | Admitting: Internal Medicine

## 2017-10-11 ENCOUNTER — Encounter: Payer: Self-pay | Admitting: Emergency Medicine

## 2017-10-11 ENCOUNTER — Other Ambulatory Visit: Payer: Self-pay

## 2017-10-11 ENCOUNTER — Other Ambulatory Visit: Payer: Self-pay | Admitting: Family Medicine

## 2017-10-11 ENCOUNTER — Inpatient Hospital Stay: Payer: Medicare Other

## 2017-10-11 ENCOUNTER — Other Ambulatory Visit (INDEPENDENT_AMBULATORY_CARE_PROVIDER_SITE_OTHER): Payer: Medicare Other

## 2017-10-11 DIAGNOSIS — Z66 Do not resuscitate: Secondary | ICD-10-CM | POA: Diagnosis present

## 2017-10-11 DIAGNOSIS — Z888 Allergy status to other drugs, medicaments and biological substances status: Secondary | ICD-10-CM

## 2017-10-11 DIAGNOSIS — Z974 Presence of external hearing-aid: Secondary | ICD-10-CM

## 2017-10-11 DIAGNOSIS — I10 Essential (primary) hypertension: Secondary | ICD-10-CM | POA: Diagnosis present

## 2017-10-11 DIAGNOSIS — Y92121 Bathroom in nursing home as the place of occurrence of the external cause: Secondary | ICD-10-CM

## 2017-10-11 DIAGNOSIS — D509 Iron deficiency anemia, unspecified: Secondary | ICD-10-CM | POA: Diagnosis present

## 2017-10-11 DIAGNOSIS — R600 Localized edema: Secondary | ICD-10-CM | POA: Diagnosis present

## 2017-10-11 DIAGNOSIS — S2242XA Multiple fractures of ribs, left side, initial encounter for closed fracture: Secondary | ICD-10-CM | POA: Diagnosis present

## 2017-10-11 DIAGNOSIS — W010XXA Fall on same level from slipping, tripping and stumbling without subsequent striking against object, initial encounter: Secondary | ICD-10-CM | POA: Diagnosis present

## 2017-10-11 DIAGNOSIS — M1990 Unspecified osteoarthritis, unspecified site: Secondary | ICD-10-CM | POA: Diagnosis present

## 2017-10-11 DIAGNOSIS — E785 Hyperlipidemia, unspecified: Secondary | ICD-10-CM | POA: Diagnosis present

## 2017-10-11 DIAGNOSIS — S80812A Abrasion, left lower leg, initial encounter: Secondary | ICD-10-CM | POA: Diagnosis present

## 2017-10-11 DIAGNOSIS — T502X5A Adverse effect of carbonic-anhydrase inhibitors, benzothiadiazides and other diuretics, initial encounter: Secondary | ICD-10-CM | POA: Diagnosis present

## 2017-10-11 DIAGNOSIS — R195 Other fecal abnormalities: Secondary | ICD-10-CM | POA: Diagnosis present

## 2017-10-11 DIAGNOSIS — M810 Age-related osteoporosis without current pathological fracture: Secondary | ICD-10-CM | POA: Diagnosis present

## 2017-10-11 DIAGNOSIS — Z85820 Personal history of malignant melanoma of skin: Secondary | ICD-10-CM | POA: Diagnosis not present

## 2017-10-11 DIAGNOSIS — E871 Hypo-osmolality and hyponatremia: Secondary | ICD-10-CM

## 2017-10-11 DIAGNOSIS — R079 Chest pain, unspecified: Secondary | ICD-10-CM | POA: Diagnosis present

## 2017-10-11 DIAGNOSIS — D649 Anemia, unspecified: Secondary | ICD-10-CM | POA: Diagnosis present

## 2017-10-11 DIAGNOSIS — S40812A Abrasion of left upper arm, initial encounter: Secondary | ICD-10-CM | POA: Diagnosis present

## 2017-10-11 DIAGNOSIS — Z7952 Long term (current) use of systemic steroids: Secondary | ICD-10-CM

## 2017-10-11 DIAGNOSIS — K219 Gastro-esophageal reflux disease without esophagitis: Secondary | ICD-10-CM | POA: Diagnosis present

## 2017-10-11 DIAGNOSIS — Z882 Allergy status to sulfonamides status: Secondary | ICD-10-CM | POA: Diagnosis not present

## 2017-10-11 DIAGNOSIS — Z885 Allergy status to narcotic agent status: Secondary | ICD-10-CM

## 2017-10-11 DIAGNOSIS — Z79899 Other long term (current) drug therapy: Secondary | ICD-10-CM | POA: Diagnosis not present

## 2017-10-11 DIAGNOSIS — E876 Hypokalemia: Secondary | ICD-10-CM | POA: Diagnosis present

## 2017-10-11 DIAGNOSIS — Z884 Allergy status to anesthetic agent status: Secondary | ICD-10-CM

## 2017-10-11 DIAGNOSIS — W19XXXA Unspecified fall, initial encounter: Secondary | ICD-10-CM

## 2017-10-11 LAB — CBC WITH DIFFERENTIAL/PLATELET
Basophils Absolute: 0 10*3/uL (ref 0–0.1)
Basophils Relative: 0 %
Eosinophils Absolute: 0 10*3/uL (ref 0–0.7)
Eosinophils Relative: 0 %
HCT: 20.7 % — ABNORMAL LOW (ref 35.0–47.0)
Hemoglobin: 6.8 g/dL — ABNORMAL LOW (ref 12.0–16.0)
Lymphocytes Relative: 3 %
Lymphs Abs: 0.4 10*3/uL — ABNORMAL LOW (ref 1.0–3.6)
MCH: 31.7 pg (ref 26.0–34.0)
MCHC: 33.1 g/dL (ref 32.0–36.0)
MCV: 96 fL (ref 80.0–100.0)
Monocytes Absolute: 0.8 10*3/uL (ref 0.2–0.9)
Monocytes Relative: 7 %
Neutro Abs: 10.1 10*3/uL — ABNORMAL HIGH (ref 1.4–6.5)
Neutrophils Relative %: 90 %
Platelets: 395 10*3/uL (ref 150–440)
RBC: 2.16 MIL/uL — ABNORMAL LOW (ref 3.80–5.20)
RDW: 15.7 % — ABNORMAL HIGH (ref 11.5–14.5)
WBC: 11.3 10*3/uL — ABNORMAL HIGH (ref 3.6–11.0)

## 2017-10-11 LAB — IRON AND TIBC
Iron: 11 ug/dL — ABNORMAL LOW (ref 28–170)
Saturation Ratios: 3 % — ABNORMAL LOW (ref 10.4–31.8)
TIBC: 331 ug/dL (ref 250–450)
UIBC: 320 ug/dL

## 2017-10-11 LAB — BASIC METABOLIC PANEL
Anion gap: 11 (ref 5–15)
BUN: 14 mg/dL (ref 6–20)
CO2: 23 mmol/L (ref 22–32)
Calcium: 9.3 mg/dL (ref 8.9–10.3)
Chloride: 93 mmol/L — ABNORMAL LOW (ref 101–111)
Creatinine, Ser: 0.76 mg/dL (ref 0.44–1.00)
GFR calc Af Amer: >60 mL/min (ref >60)
GFR calc non Af Amer: >60 mL/min (ref >60)
Glucose, Bld: 120 mg/dL — ABNORMAL HIGH (ref 65–99)
Potassium: 3.4 mmol/L — ABNORMAL LOW (ref 3.5–5.1)
Sodium: 127 mmol/L — ABNORMAL LOW (ref 135–145)

## 2017-10-11 LAB — FERRITIN
Ferritin: 23.3 ng/mL (ref 10.0–291.0)
Ferritin: 49 ng/mL (ref 11–307)

## 2017-10-11 LAB — IBC PANEL
Iron: 238 ug/dL — ABNORMAL HIGH (ref 42–145)
Saturation Ratios: 64.9 % — ABNORMAL HIGH (ref 20.0–50.0)
Transferrin: 262 mg/dL (ref 212.0–360.0)

## 2017-10-11 LAB — FOLATE: Folate: 20 ng/mL (ref >5.9)

## 2017-10-11 LAB — ABO/RH: ABO/RH(D): O POS

## 2017-10-11 LAB — RETICULOCYTES
RBC.: 1.96 MIL/uL — ABNORMAL LOW (ref 3.80–5.20)
Retic Count, Absolute: 92.1 10*3/uL (ref 19.0–183.0)
Retic Ct Pct: 4.7 % — ABNORMAL HIGH (ref 0.4–3.1)

## 2017-10-11 LAB — PREPARE RBC (CROSSMATCH)

## 2017-10-11 LAB — VITAMIN B12: Vitamin B-12: 2636 pg/mL — ABNORMAL HIGH (ref 180–914)

## 2017-10-11 LAB — TSH: TSH: 2.721 u[IU]/mL (ref 0.350–4.500)

## 2017-10-11 MED ORDER — PREDNISOLONE ACETATE 0.12 % OP SUSP
1.0000 [drp] | Freq: Every day | OPHTHALMIC | Status: DC
  Administered 2017-10-11: 1 [drp] via OPHTHALMIC
  Filled 2017-10-11: qty 5

## 2017-10-11 MED ORDER — SODIUM CHLORIDE 0.9 % IV BOLUS: 1000.0000 mL | Freq: Once | INTRAVENOUS | Status: DC

## 2017-10-11 MED ORDER — SODIUM CHLORIDE 0.9 % IV SOLN: 10.0000 mL/h | Freq: Once | INTRAVENOUS | Status: DC

## 2017-10-11 MED ORDER — ACETAMINOPHEN 650 MG RE SUPP: 650.0000 mg | Freq: Four times a day (QID) | RECTAL | Status: DC | PRN

## 2017-10-11 MED ORDER — VITAMIN D 1000 UNITS PO TABS
1000.0000 [IU] | ORAL_TABLET | Freq: Every day | ORAL | Status: DC
  Administered 2017-10-11 – 2017-10-14 (×4): 1000 [IU] via ORAL
  Filled 2017-10-11 (×4): qty 1

## 2017-10-11 MED ORDER — FERROUS SULFATE 325 (65 FE) MG PO TABS
325.0000 mg | ORAL_TABLET | Freq: Every day | ORAL | Status: DC
  Administered 2017-10-12 – 2017-10-13 (×2): 325 mg via ORAL
  Filled 2017-10-11 (×2): qty 1

## 2017-10-11 MED ORDER — PANTOPRAZOLE SODIUM 40 MG IV SOLR: 40.0000 mg | Freq: Two times a day (BID) | INTRAVENOUS | Status: DC

## 2017-10-11 MED ORDER — SODIUM CHLORIDE 0.9 % IV SOLN
INTRAVENOUS | Status: DC
  Administered 2017-10-11 – 2017-10-12 (×2): via INTRAVENOUS

## 2017-10-11 MED ORDER — CALCIUM CARBONATE ANTACID 500 MG PO CHEW
1.0000 | CHEWABLE_TABLET | Freq: Two times a day (BID) | ORAL | Status: DC
  Administered 2017-10-11 – 2017-10-14 (×6): 200 mg via ORAL
  Filled 2017-10-11 (×6): qty 1

## 2017-10-11 MED ORDER — ONDANSETRON HCL 4 MG/2ML IJ SOLN: 4.0000 mg | Freq: Four times a day (QID) | INTRAMUSCULAR | Status: DC | PRN

## 2017-10-11 MED ORDER — POTASSIUM CHLORIDE CRYS ER 20 MEQ PO TBCR
40.0000 meq | EXTENDED_RELEASE_TABLET | Freq: Once | ORAL | Status: AC
  Administered 2017-10-11: 14:00:00 40 meq via ORAL
  Filled 2017-10-11: qty 2

## 2017-10-11 MED ORDER — ONDANSETRON HCL 4 MG PO TABS: 4.0000 mg | ORAL_TABLET | Freq: Four times a day (QID) | ORAL | Status: DC | PRN

## 2017-10-11 MED ORDER — PANTOPRAZOLE SODIUM 40 MG IV SOLR
8.0000 mg/h | INTRAVENOUS | Status: DC
  Administered 2017-10-11 – 2017-10-12 (×2): 8 mg/h via INTRAVENOUS
  Filled 2017-10-11 (×2): qty 80

## 2017-10-11 MED ORDER — POLYETHYLENE GLYCOL 3350 17 G PO PACK: 17.0000 g | PACK | Freq: Every day | ORAL | Status: DC | PRN

## 2017-10-11 MED ORDER — ACETAMINOPHEN 500 MG PO TABS
500.0000 mg | ORAL_TABLET | Freq: Every evening | ORAL | Status: DC | PRN
  Administered 2017-10-11: 22:00:00 500 mg via ORAL
  Filled 2017-10-11: qty 1

## 2017-10-11 MED ORDER — SODIUM CHLORIDE 0.9 % IV SOLN
80.0000 mg | Freq: Once | INTRAVENOUS | Status: AC
  Administered 2017-10-11: 80 mg via INTRAVENOUS
  Filled 2017-10-11: qty 40

## 2017-10-11 MED ORDER — HYDRALAZINE HCL 20 MG/ML IJ SOLN: 10.0000 mg | Freq: Four times a day (QID) | INTRAMUSCULAR | Status: DC | PRN

## 2017-10-11 MED ORDER — VITAMIN B-12 1000 MCG PO TABS
1000.0000 ug | ORAL_TABLET | ORAL | Status: DC
  Administered 2017-10-13: 09:00:00 1000 ug via ORAL
  Filled 2017-10-11: qty 1

## 2017-10-11 MED ORDER — ACETAMINOPHEN 325 MG PO TABS
650.0000 mg | ORAL_TABLET | Freq: Four times a day (QID) | ORAL | Status: DC | PRN
  Administered 2017-10-11 – 2017-10-14 (×5): 650 mg via ORAL
  Filled 2017-10-11 (×5): qty 2

## 2017-10-11 MED ORDER — DIPHENHYDRAMINE HCL 25 MG PO CAPS
25.0000 mg | ORAL_CAPSULE | Freq: Every evening | ORAL | Status: DC | PRN
  Administered 2017-10-11: 22:00:00 25 mg via ORAL
  Filled 2017-10-11 (×2): qty 1

## 2017-10-11 NOTE — Plan of Care (Signed)
  Problem: Clinical Measurements: Goal: Will remain free from infection Outcome: Progressing Note:  Remains afebrile   Problem: Pain Managment: Goal: General experience of comfort will improve Outcome: Progressing Note:  PRN medications   Problem: Safety: Goal: Ability to remain free from injury will improve Outcome: Progressing Note:  Fall precautions initiated, non skid socks when oob, bed alarm on   Problem: Clinical Measurements: Goal: Diagnostic test results will improve Outcome: Not Progressing Note:  K 3.4 replaced po HGB 6.8/20.7 NA 127 Chloride 93

## 2017-10-11 NOTE — ED Triage Notes (Signed)
Pt to ED via EMS from [facility], pt had mechanical fall x6hrs ago in bathroom. Pt has mulitple skin tears on LFT forearm and LFT knee. PT denies any head injury, pt takes asprin daily. PT A&OX4, VSS

## 2017-10-11 NOTE — H&P (Signed)
Teresa Meyer NAME: Teresa Meyer    MR#:  [id number]  DATE OF BIRTH:  1923/02/04  DATE OF ADMISSION:  10/11/2017  PRIMARY CARE PHYSICIAN: [name], MD   REQUESTING/REFERRING PHYSICIAN: dr Archie Balboa  CHIEF COMPLAINT:   fall HISTORY OF PRESENT ILLNESS:  Teresa Meyer  is a 84 y.o. female with a known history of lower extremity edema, esophageal stricture and essential hypertension who presents to the emergency room after fall.  Patient was seen by her primary care physician yesterday for follow-up on her lower extremity edema.  At that time she was complaining of some epigastric fullness.  PCP advised to take PPI daily.  She also had lab work drawn.  She was called from her primary care physician's office yesterday due to low hemoglobin and was advised to continue PPI and start OTC iron tablet. Today she comes to the emergency room from [facility] assisted facility with a mechanical fall.  She denies syncope or fall to her head.  She reports she was using her walker and her walker slipped over the small rug she had.  She fell to her left side.  She has multiple bruising and skin abrasions on her left arm and leg  It is again noted that her hemoglobin is low. She is consented for blood transfusion. Per ER physician she is very mildly guaiac positive Patient denies melena or hematochezia.  PAST MEDICAL HISTORY:   Past Medical History:  Diagnosis Date  . Arthritis   . Atypical nevi 09/2014   lentiginous dysplastic nevus with mod atypia ([place])  . Diverticulitis   . DNR (do not resuscitate)   . Esophageal stricture 2012  . GERD (gastroesophageal reflux disease)   . HLD (hyperlipidemia)    did not tolerate statin, stopped checking  . HTN (hypertension)   . Malignant melanoma ([place]) 2015, 2017   R chin s/p excision ([place]) R upper back [name])  . Osteoporosis, post-menopausal    bisphosphonate caused dysphagia, requests defer DEXA  for now  . Seasonal allergies   . Wears hearing aid     PAST SURGICAL HISTORY:   Past Surgical History:  Procedure Laterality Date  . BUNIONECTOMY Right 2001  . CARPAL TUNNEL RELEASE Bilateral 1974  . CATARACT EXTRACTION Bilateral    [place]  . TONSILLECTOMY  1928    SOCIAL HISTORY:   Social History   Tobacco Use  . Smoking status: Never Smoker  . Smokeless tobacco: Never Used  Substance Use Topics  . Alcohol use: Yes    Alcohol/week: 0.0 oz    Comment: 1/2 glass wine nightly    FAMILY HISTORY:   Family History  Problem Relation Age of Onset  . Pancreatic cancer Son   . Breast cancer Sister   . Lung cancer Mother   . Diabetes Other        nephew  . Stroke Neg Hx   . CAD Neg Hx     DRUG ALLERGIES:   Allergies  Allergen Reactions  . Bisphosphonates Other (See Comments)    Dysphagia to oral bisphosphonates  . Codeine   . Statins Other (See Comments)    Muscle aches  . Sulfa Antibiotics     Can't recall reaction  . Procaine Hcl Palpitations    REVIEW OF SYSTEMS:   Review of Systems  Constitutional: Negative.  Negative for chills, fever and malaise/fatigue.  HENT: Negative.  Negative for ear discharge, ear pain, hearing loss, nosebleeds and sore  throat.   Eyes: Negative.  Negative for blurred vision and pain.  Respiratory: Negative.  Negative for cough, hemoptysis, shortness of breath and wheezing.   Cardiovascular: Negative.  Negative for chest pain, palpitations and leg swelling.  Gastrointestinal: Negative.  Negative for abdominal pain, blood in stool, diarrhea, nausea and vomiting.  Genitourinary: Negative.  Negative for dysuria.  Musculoskeletal: Positive for falls and joint pain (left rib pain). Negative for back pain.  Skin:       Skin abrasions  Neurological: Negative for dizziness, tremors, speech change, focal weakness, seizures and headaches.  Endo/Heme/Allergies: Negative.  Does not bruise/bleed easily.  Psychiatric/Behavioral:  Negative.  Negative for depression, hallucinations and suicidal ideas.    MEDICATIONS AT HOME:   Prior to Admission medications   Medication Sig Start Date End Date Taking? Authorizing Provider  calcium carbonate (TUMS - DOSED IN MG ELEMENTAL CALCIUM) 500 MG chewable tablet Chew 1 tablet by mouth 2 (two) times daily with a meal.   Yes [provider]  cholecalciferol (VITAMIN D) 1000 UNITS tablet Take 1,000 Units by mouth daily.   Yes [provider]  diphenhydramine-acetaminophen (TYLENOL PM) 25-500 MG TABS Take 2 tablets by mouth at bedtime as needed.   Yes [provider]  ferrous sulfate 325 (65 FE) MG tablet Take 1 tablet (325 mg total) by mouth once a week. Patient taking differently: Take 325 mg by mouth daily with breakfast.  02/12/17  Yes [name], MD  hydrochlorothiazide (HYDRODIURIL) 25 MG tablet Take 1 tablet (25 mg total) by mouth daily. 09/20/17  Yes [name], MD  prednisoLONE acetate (PRED MILD) 0.12 % ophthalmic suspension Place 1 drop into the left eye daily.    Yes [provider]  vitamin B-12 (CYANOCOBALAMIN) 1000 MCG tablet Take 1 tablet (1,000 mcg total) by mouth 2 (two) times a week. 02/10/17  Yes [name], MD  omeprazole (PRILOSEC) 40 MG capsule Take 1 capsule (40 mg total) by mouth daily. Take 30 min before a meal Patient not taking: Reported on 10/11/2017 10/10/17   [name], MD      VITAL SIGNS:  Blood pressure (!) 142/72, pulse 88, temperature 98.3 F (36.8 C), temperature source Oral, resp. rate 16, SpO2 100 %.  PHYSICAL EXAMINATION:   Physical Exam  Constitutional: She is oriented to person, place, and time. No distress.  HENT:  Head: Normocephalic.  Eyes: No scleral icterus.  Neck: Normal range of motion. Neck supple. No JVD present. No tracheal deviation present.  Cardiovascular: Normal rate, regular rhythm and normal heart sounds. Exam reveals no gallop and no friction rub.  No murmur  heard. Pulmonary/Chest: Effort normal and breath sounds normal. No respiratory distress. She has no wheezes. She has no rales. She exhibits no tenderness.  Abdominal: Soft. Bowel sounds are normal. She exhibits no distension and no mass. There is no tenderness. There is no rebound and no guarding.  Musculoskeletal: Normal range of motion. She exhibits no edema.  Neurological: She is alert and oriented to person, place, and time.  Skin: Skin is warm. No rash noted. No erythema.  Skin tears on arm/leg Bruise left leg  Psychiatric: Judgment normal.      LABORATORY PANEL:   CBC Recent Labs  Lab 10/11/17 0934  WBC 11.3*  HGB 6.8*  HCT 20.7*  PLT 395   ------------------------------------------------------------------------------------------------------------------  Chemistries  Recent Labs  Lab 10/10/17 1437 10/11/17 0934  NA 129* 127*  K 3.6 3.4*  CL 93* 93*  CO2 27 23  GLUCOSE 119* 120*  BUN 13 14  CREATININE 0.89 0.76  CALCIUM 9.5 9.3  AST 15  --   ALT 8  --   ALKPHOS 40  --   BILITOT 0.3  --    ------------------------------------------------------------------------------------------------------------------  Cardiac Enzymes No results for input(s): TROPONINI in the last 168 hours. ------------------------------------------------------------------------------------------------------------------  RADIOLOGY:  No results found.  EKG:   Sinus rhythm Ventricular premature complex  Inferior infarct, age indeterminate Artifact in lead(s) I II aVR aVL aVF V1  IMPRESSION AND PLAN:   84 year old female with history of osteoporosis who is status post mechanical fall and found to have low hemoglobin.  1.  Acute on chronic anemia: Anemia panel ordered Patient consented and will receive 1 unit PRBC Hemoglobin follow-up after this Patient does not want aggressive management including GI consult Can continue PPI which was started in ED for 24 to 48 hours.  2.   Hyponatremia: This was due to HCTZ. Dose was increased due to lower extremity edema Gentle hydration and follow-up on sodium level tomorrow morning Check TSH  3.  Essential hypertension: Hold HCTZ for now  4.  Lower extremity edema: No evidence of lower extremity edema at this point  5.  Hypokalemia: Replace and recheck in a.m.   6.  Rib pain, left side: Check x-ray Incentive spirometer ordered  PT consult and clinical social work consult for discharge planning  All the records are reviewed and case discussed with ED provider. Management plans discussed with the patient and she isin agreement  CODE STATUS: dnr  TOTAL TIME TAKING CARE OF THIS PATIENT: 41 minutes.    Julienne Vogler M.D on 10/11/2017 at 11:03 AM  Between 7am to 6pm - Pager - [phone]  After 6pm go to [email or web address] - password [email or web address] [facility]  Office  [phone]  CC: Primary care physician; [name], MD

## 2017-10-11 NOTE — ED Notes (Signed)
Pt given sandwich tray at this time  

## 2017-10-11 NOTE — NC FL2 (Addendum)
[place] LEVEL OF CARE SCREENING TOOL     IDENTIFICATION  Patient Name: NIASHA DEVINS Birthdate: 11-11-1922 Sex: female Admission Date (Current Location): 10/11/2017  [place] and [place] Number:  Engineering geologist and Address:  [facility], [address] [place], [place] [address]      Provider Number: [id number]  Attending Physician Name and Address:  Bettey Costa, MD  Relative Name and Phone Number:       Current Level of Care: Hospital Recommended Level of Care: [place] Prior Approval Number:    Date Approved/Denied:   PASRR Number: [id number]  Discharge Plan: SNF    Current Diagnoses: Patient Active Problem List   Diagnosis Date Noted  . Anemia 10/11/2017  . Epigastric discomfort 10/10/2017  . Macrocytic anemia 12/26/2015  . Pedal edema 10/28/2015  . Systolic murmur 85/46/2703  . Advanced care planning/counseling discussion 05/07/2014  . Malignant melanoma ([place])   . Medicare annual wellness visit, subsequent 09/24/2013  . DNR (do not resuscitate)   . GERD 07/15/2009  . ESOPHAGEAL STRICTURE 12/19/2008  . ARTHRITIS 11/26/2008  . HYPERCHOLESTEROLEMIA 11/20/2008  . Essential hypertension 11/20/2008  . DIVERTICULOSIS, COLON 11/20/2008  . Osteopenia 11/20/2008  . Dysphagia 11/20/2008    Orientation RESPIRATION BLADDER Height & Weight     Self, Time, Situation, Place  Normal Continent Weight: 125 lb 12.8 oz (57.1 kg) Height:  5' (152.4 cm)  BEHAVIORAL SYMPTOMS/MOOD NEUROLOGICAL BOWEL NUTRITION STATUS  (None) (none) Continent Diet(Regular)  AMBULATORY STATUS COMMUNICATION OF NEEDS Skin   Extensive Assist Verbally Normal                       Personal Care Assistance Level of Assistance  Bathing, Feeding, Dressing Bathing Assistance: Limited assistance Feeding assistance: Independent Dressing Assistance: Limited assistance     Functional Limitations Info  Sight, Hearing, Speech Sight  Info: Adequate Hearing Info: Impaired(Has hearing aids ) Speech Info: Adequate    SPECIAL CARE FACTORS FREQUENCY  PT (By licensed PT), OT (By licensed OT)                    Contractures Contractures Info: Not present    Additional Factors Info  Code Status, Allergies Code Status Info: DNR Allergies Info: Sulfa Antibiotics, Codeine, Procaine Hcl, Bisphosphonates, Statins            Current Medications (10/11/2017):  This is the current hospital active medication list Current Facility-Administered Medications  Medication Dose Route Frequency Provider Last Rate Last Dose  . 0.9 %  sodium chloride infusion  10 mL/hr Intravenous Once Nance Pear, MD      . 0.9 %  sodium chloride infusion   Intravenous Continuous Bettey Costa, MD   Stopped at 10/11/17 1505  . acetaminophen (TYLENOL) tablet 650 mg  650 mg Oral Q6H PRN Bettey Costa, MD   650 mg at 10/11/17 1512   Or  . acetaminophen (TYLENOL) suppository 650 mg  650 mg Rectal Q6H PRN Bettey Costa, MD      . calcium carbonate (TUMS - dosed in mg elemental calcium) chewable tablet 200 mg of elemental calcium  1 tablet Oral BID WC Mody, Sital, MD      . cholecalciferol (VITAMIN D) tablet 1,000 Units  1,000 Units Oral Daily Bettey Costa, MD   1,000 Units at 10/11/17 1339  . [START ON 10/12/2017] ferrous sulfate tablet 325 mg  325 mg Oral Q breakfast Bettey Costa, MD      .  hydrALAZINE (APRESOLINE) injection 10 mg  10 mg Intravenous Q6H PRN Mody, Sital, MD      . ondansetron (ZOFRAN) tablet 4 mg  4 mg Oral Q6H PRN Mody, Sital, MD       Or  . ondansetron (ZOFRAN) injection 4 mg  4 mg Intravenous Q6H PRN Mody, Sital, MD      . pantoprazole (PROTONIX) 80 mg in sodium chloride 0.9 % 250 mL (0.32 mg/mL) infusion  8 mg/hr Intravenous Continuous Mody, Sital, MD 25 mL/hr at 10/11/17 1056 8 mg/hr at 10/11/17 1056  . [START ON 10/14/2017] pantoprazole (PROTONIX) injection 40 mg  40 mg Intravenous Q12H Mody, Sital, MD      . polyethylene glycol  (MIRALAX / GLYCOLAX) packet 17 g  17 g Oral Daily PRN Mody, Sital, MD      . prednisoLONE acetate (PRED MILD) 0.12 % ophthalmic suspension 1 drop  1 drop Left Eye Daily Bettey Costa, MD   1 drop at 10/11/17 1340  . [START ON 10/13/2017] vitamin B-12 (CYANOCOBALAMIN) tablet 1,000 mcg  1,000 mcg Oral Once per day on Mon Thu Mody, Sital, MD         Discharge Medications: Please see discharge summary for a list of discharge medications.  Relevant Imaging Results:  Relevant Lab Results:   Additional Information SSN [id number]  Annamaria Boots, [place]

## 2017-10-11 NOTE — Progress Notes (Signed)
Unit of PRBC's finished, pt tolerated well.

## 2017-10-11 NOTE — Progress Notes (Signed)
PT Cancellation Note  Patient Details Name: Teresa Meyer MRN: [id number] DOB: 09-Apr-1923   Cancelled Treatment:    Reason Eval/Treat Not Completed: Medical issues which prohibited therapy Pt with HGB of 6.8, spoke with nursing who states she is currently getting a blood transfusion that will be going for a few more hours.  Will hold PT eval today and attempt to her her tomorrow as appropriate.   Kreg Shropshire, DPT 10/11/2017, 3:47 PM

## 2017-10-11 NOTE — Clinical Social Work Note (Signed)
Clinical Social Work Assessment  Patient Details  Name: Teresa Meyer MRN: [id number] Date of Birth: Oct 15, 1922  Date of referral:  10/11/17               Reason for consult:  Facility Placement                Permission sought to share information with:  Case Manager, Customer service manager, Family Supports Permission granted to share information::  Yes, Verbal Permission Granted  Name::      Chief Executive Officer::     Relationship::     Contact Information:     Housing/Transportation Living arrangements for the past 2 months:  Independent Living Facility([facility] ) Source of Information:  Patient Patient Interpreter Needed:  None Criminal Activity/Legal Involvement Pertinent to Current Situation/Hospitalization:  No - Comment as needed Significant Relationships:  Adult Children, Other Family Members Lives with:  Self, Facility Resident Do you feel safe going back to the place where you live?  Yes Need for family participation in patient care:  Yes (Comment)  Care giving concerns:  Patient is a resident at [facility] in [place].    Social Worker assessment / plan:  CSW consulted because patient is from facility. CSW met with patient and nephew Teresa Meyer at bedside. CSW explained role and asked about current living situation. Patient states that she lives at [facility] in [place] living. Patient states that she has lived there for 20 years. Patient reports that she has multiple sitters that come assist her throughout the day. CSW asked patient if CSW could contact daughter and patient agreed. CSW contacted patient's daughter Teresa Meyer [phone]. CSW explained role to daughter. Daughter also states that patient has been at [facility] for 20 years or more and is in independent living. CSW explained that there has not been a therapy evaluation yet and we need that to make a determination on placement for patient. Daughter requests that patient go to SNF  at discharge because they do not feel that being in [place] living is safe for her. CSW explained that we would have to see what therapy determines and what her needs are. CSW also explained the 3 midnight rule for Medicare. Patient is currently inpatient. Daughter expressed understanding. Daughter also confirmed that patient does have sitters or "checker" as patient calls them, that come to her apartment everyday. Daughter states that the sitters only come for up to an hour at a time but they do come 3 days a week. Family feels that this is not sufficient for her need but patient is very independent and does not want to have more help. CSW explained that level of care change would have to be done by [facility] but that she could potentially go to SNF at discharge for rehab. Daughter is in agreement with this. CSW contacted Seth Bake, admissions coordinator at [facility] about patient. Seth Bake is aware of above and is expecting her to come to SNF. CSW will continue to follow for discharge needs.   Employment status:  Retired Forensic scientist:  Medicare PT Recommendations:  Not assessed at this time Information / Referral to community resources:  [place]  Patient/Family's Response to care:  Patient very pleasant and accepting of CSW assistance   Patient/Family's Understanding of and Emotional Response to Diagnosis, Current Treatment, and Prognosis:  Daughter is aware of above and in agreement to plan   Emotional Assessment Appearance:  Appears younger than stated age  Attitude/Demeanor/Rapport:    Affect (typically observed):  Accepting, Pleasant, Calm Orientation:  Oriented to Self, Oriented to Place, Oriented to  Time, Oriented to Situation Alcohol / Substance use:  Not Applicable Psych involvement (Current and /or in the community):  No (Comment)  Discharge Needs  Concerns to be addressed:  Discharge Planning Concerns Readmission within the last 30 days:  No Current  discharge risk:  Dependent with Mobility Barriers to Discharge:  Continued Medical Work up   [employer], [place] 10/11/2017, 3:02 PM

## 2017-10-11 NOTE — Progress Notes (Signed)
1st unit PRBC's hung, s/s transfusion reaction explained to pt.

## 2017-10-11 NOTE — Addendum Note (Signed)
Addended by: Ellamae Sia on: 10/11/2017 12:11 PM   Modules accepted: Orders

## 2017-10-11 NOTE — Progress Notes (Signed)
Family Meeting Note  Advance Directive:yes  Today a meeting took place with the Patient.    The following clinical team members were present during this meeting:MD  The following were discussed:Patient's diagnosis: Acute on chronic anemia, Patient's progosis: Unable to determine and Goals for treatment: DNR  Additional follow-up to be provided: Patient is DNR.  Does not wish to change advanced directives at this time.  Time spent during discussion: 16 minutes  Cruise Baumgardner, MD

## 2017-10-11 NOTE — ED Provider Notes (Signed)
[facility] Emergency Department Provider Note  ____________________________________________   I have reviewed the triage vital signs and the nursing notes.   HISTORY  Chief Complaint Fall   History limited by: Not Limited   HPI Teresa Meyer is a 84 y.o. female who presents to the emergency department today after a fall.  The patient states that she was leaving the bathroom.  She states the next thing she knew she was on the ground.  She thinks that she might of fall because of her walker got away from her.  She does not recall any feelings of lightheadedness or dizziness prior to the fall.  She denies any chest pain or palpitations.   Per medical record review patient has a history of systolic murmur. Blood work yesterday which showed anemia.   Past Medical History:  Diagnosis Date  . Arthritis   . Atypical nevi 09/2014   lentiginous dysplastic nevus with mod atypia ([place])  . Diverticulitis   . DNR (do not resuscitate)   . Esophageal stricture 2012  . GERD (gastroesophageal reflux disease)   . HLD (hyperlipidemia)    did not tolerate statin, stopped checking  . HTN (hypertension)   . Malignant melanoma ([place]) 2015, 2017   R chin s/p excision ([place]) R upper back [name])  . Osteoporosis, post-menopausal    bisphosphonate caused dysphagia, requests defer DEXA for now  . Seasonal allergies   . Wears hearing aid     Patient Active Problem List   Diagnosis Date Noted  . Epigastric discomfort 10/10/2017  . Macrocytic anemia 12/26/2015  . Pedal edema 10/28/2015  . Systolic murmur 64/40/3474  . Advanced care planning/counseling discussion 05/07/2014  . Malignant melanoma ([place])   . Medicare annual wellness visit, subsequent 09/24/2013  . DNR (do not resuscitate)   . GERD 07/15/2009  . ESOPHAGEAL STRICTURE 12/19/2008  . ARTHRITIS 11/26/2008  . HYPERCHOLESTEROLEMIA 11/20/2008  . Essential hypertension 11/20/2008  . DIVERTICULOSIS, COLON  11/20/2008  . Osteopenia 11/20/2008  . Dysphagia 11/20/2008    Past Surgical History:  Procedure Laterality Date  . BUNIONECTOMY Right 2001  . CARPAL TUNNEL RELEASE Bilateral 1974  . CATARACT EXTRACTION Bilateral    [place]  . TONSILLECTOMY  1928    Prior to Admission medications   Medication Sig Start Date End Date Taking? Authorizing Provider  calcium carbonate (TUMS - DOSED IN MG ELEMENTAL CALCIUM) 500 MG chewable tablet Chew 1 tablet by mouth 2 (two) times daily with a meal.    [provider]  cholecalciferol (VITAMIN D) 1000 UNITS tablet Take 1,000 Units by mouth daily.    [provider]  diphenhydramine-acetaminophen (TYLENOL PM) 25-500 MG TABS Take 2 tablets by mouth at bedtime as needed.    [provider]  ferrous sulfate 325 (65 FE) MG tablet Take 1 tablet (325 mg total) by mouth once a week. 02/12/17   [name], MD  hydrochlorothiazide (HYDRODIURIL) 25 MG tablet Take 1 tablet (25 mg total) by mouth daily. 09/20/17   [name], MD  omeprazole (PRILOSEC) 40 MG capsule Take 1 capsule (40 mg total) by mouth daily. Take 30 min before a meal 10/10/17   [name], MD  prednisoLONE acetate (PRED MILD) 0.12 % ophthalmic suspension Place 1 drop into the left eye daily.     [provider]  vitamin B-12 (CYANOCOBALAMIN) 1000 MCG tablet Take 1 tablet (1,000 mcg total) by mouth 2 (two) times a week. 02/10/17   [name], MD    Allergies  Bisphosphonates; Codeine; Statins; Sulfa antibiotics; and Procaine hcl  Family History  Problem Relation Age of Onset  . Pancreatic cancer Son   . Breast cancer Sister   . Lung cancer Mother   . Diabetes Other        nephew  . Stroke Neg Hx   . CAD Neg Hx     Social History Social History   Tobacco Use  . Smoking status: Never Smoker  . Smokeless tobacco: Never Used  Substance Use Topics  . Alcohol use: Yes    Alcohol/week: 0.0 oz    Comment: 1/2 glass wine nightly  .  Drug use: No    Review of Systems Constitutional: No fever/chills Eyes: No visual changes. ENT: No sore throat. Cardiovascular: Positive for left sided chest pain. Respiratory: Denies shortness of breath. Gastrointestinal: No abdominal pain.  No nausea, no vomiting.  No diarrhea.   Genitourinary: Negative for dysuria. Musculoskeletal: Negative for back pain. Skin: Positive for skin tears.  Neurological: Negative for headaches, focal weakness or numbness.  ____________________________________________   PHYSICAL EXAM:  VITAL SIGNS: ED Triage Vitals  Enc Vitals Group     BP 10/11/17 0846 (!) 142/72     Pulse Rate 10/11/17 0846 88     Resp 10/11/17 0846 16     Temp 10/11/17 0847 98.3 F (36.8 C)     Temp Source 10/11/17 0846 Oral     SpO2 10/11/17 0846 100 %     Weight --      Height --      Head Circumference --      Peak Flow --      Pain Score 10/11/17 0846 3     Pain Loc --     Constitutional: Alert and oriented. Well appearing and in no distress. Eyes: Conjunctivae are normal.  ENT   Head: Normocephalic and atraumatic.   Nose: No congestion/rhinnorhea.   Mouth/Throat: Mucous membranes are moist.   Neck: No stridor. No midline tenderness.  Hematological/Lymphatic/Immunilogical: No cervical lymphadenopathy. Cardiovascular: Normal rate, regular rhythm.  Systolic murmur. Respiratory: Normal respiratory effort without tachypnea nor retractions. Breath sounds are clear and equal bilaterally. No wheezes/rales/rhonchi. Gastrointestinal: Soft and non tender. No rebound. No guarding.  Genitourinary: Deferred Musculoskeletal: Normal range of motion in all extremities. No lower extremity edema. Neurologic:  Normal speech and language. No gross focal neurologic deficits are appreciated.  Skin:  Multiple skin tears to left upper extremity and left shin.  Psychiatric: Mood and affect are normal. Speech and behavior are normal. Patient exhibits appropriate insight  and judgment.  ____________________________________________    LABS (pertinent positives/negatives)  BMP na 127, k 3.4, glu 120, cr 0.76 CBC wbc 11.3, hgb 6.8, plt 395  ____________________________________________   EKG  I, Nance Pear, attending physician, personally viewed and interpreted this EKG  EKG Time: 0851 Rate: 85 Rhythm: sinus rhythm with pvc Axis: left axis deviation Intervals: qtc 481 QRS: lvh ST changes: no st elevation Impression: abnormal ekg   ____________________________________________    RADIOLOGY  None  ____________________________________________   PROCEDURES  Procedures  CRITICAL CARE Performed by: Nance Pear   Total critical care time: 35 minutes  Critical care time was exclusive of separately billable procedures and treating other patients.  Critical care was necessary to treat or prevent imminent or life-threatening deterioration.  Critical care was time spent personally by me on the following activities: development of treatment plan with patient and/or surrogate as well as nursing, discussions with consultants, evaluation of patient's response to  treatment, examination of patient, obtaining history from patient or surrogate, ordering and performing treatments and interventions, ordering and review of laboratory studies, ordering and review of radiographic studies, pulse oximetry and re-evaluation of patient's condition.  ____________________________________________   INITIAL IMPRESSION / ASSESSMENT AND PLAN / ED COURSE  Pertinent labs & imaging results that were available during my care of the patient were reviewed by me and considered in my medical decision making (see chart for details).  Patient presented to the emergency department today after a fall.  Patient thinks that she might have lost balance with her walker however did have concerns for organic cause.  Patient was found to be quite anemic on blood work  yesterday.  Repeat blood work today does show anemia as well as hyponatremia.  Both could be contributing to weakness. GUIAC minimally positive, will start protonix.  Discussed and consented patient for blood transfusion.  Will start fluids.  Will plan on admission in the hospital service.   ____________________________________________   FINAL CLINICAL IMPRESSION(S) / ED DIAGNOSES  Final diagnoses:  Fall, initial encounter  Anemia, unspecified type  Hyponatremia     Note: This dictation was prepared with Dragon dictation. Any transcriptional errors that result from this process are unintentional     Nance Pear, MD 10/11/17 [phone]

## 2017-10-11 NOTE — Progress Notes (Signed)
84 yo wf admitted to room 113 via stretcher from ED with mechanical fall at home and anemia.  A&Ox3, unable to assess gait at this time.  No distress on ra. Lungs diminished lower lobes bil.  Abdomen benign.  Skin tear to lt upper posterior arm and left elbow area, sites cleaned with saline Mepitel/telfa/kerlex applied.  Large skin tear to lt knee, site cleaned  with saline Mepitel/telfa/kerlex applied. Large blood blister lt lower leg area dressed with telfa/Kerlex.  Small scabbed area noted to rt inner ankle.  Scattered bruises noted to bil arms/legs.  IVF infusing rt fa, Protonix gtt lt hand.  Pt oriented to room and surroundings, POC reviewed with pt.  CB in reach, SR up x 2, bed alarm on.

## 2017-10-12 ENCOUNTER — Telehealth: Payer: Self-pay | Admitting: Family Medicine

## 2017-10-12 LAB — CBC
HCT: 21.6 % — ABNORMAL LOW (ref 35.0–47.0)
Hemoglobin: 7.4 g/dL — ABNORMAL LOW (ref 12.0–16.0)
MCH: 31.4 pg (ref 26.0–34.0)
MCHC: 34.2 g/dL (ref 32.0–36.0)
MCV: 91.8 fL (ref 80.0–100.0)
Platelets: 295 10*3/uL (ref 150–440)
RBC: 2.35 MIL/uL — ABNORMAL LOW (ref 3.80–5.20)
RDW: 17.5 % — ABNORMAL HIGH (ref 11.5–14.5)
WBC: 6.1 10*3/uL (ref 3.6–11.0)

## 2017-10-12 LAB — CHLORIDE, URINE, RANDOM: Chloride Urine: 64 mmol/L (ref 32–290)

## 2017-10-12 LAB — TYPE AND SCREEN
ABO/RH(D): O POS
Antibody Screen: NEGATIVE
Unit division: 0

## 2017-10-12 LAB — BASIC METABOLIC PANEL
Anion gap: 7 (ref 5–15)
BUN: 12 mg/dL (ref 6–20)
CO2: 23 mmol/L (ref 22–32)
Calcium: 7.8 mg/dL — ABNORMAL LOW (ref 8.9–10.3)
Chloride: 100 mmol/L — ABNORMAL LOW (ref 101–111)
Creatinine, Ser: 0.81 mg/dL (ref 0.44–1.00)
GFR calc Af Amer: >60 mL/min (ref >60)
GFR calc non Af Amer: >60 mL/min (ref >60)
Glucose, Bld: 98 mg/dL (ref 65–99)
Potassium: 3.6 mmol/L (ref 3.5–5.1)
Sodium: 130 mmol/L — ABNORMAL LOW (ref 135–145)

## 2017-10-12 LAB — SODIUM, URINE, RANDOM: Sodium, Ur: 61 mmol/L (ref 28–272)

## 2017-10-12 LAB — BPAM RBC
Blood Product Expiration Date: 201906122359
ISSUE DATE / TIME: 201905211455
Unit Type and Rh: 5100

## 2017-10-12 LAB — EXTRA URINE SPECIMEN

## 2017-10-12 MED ORDER — PANTOPRAZOLE SODIUM 40 MG PO TBEC: 40.0000 mg | DELAYED_RELEASE_TABLET | Freq: Two times a day (BID) | ORAL | Status: DC

## 2017-10-12 MED ORDER — ORAL CARE MOUTH RINSE
15.0000 mL | Freq: Two times a day (BID) | OROMUCOSAL | Status: DC
  Administered 2017-10-12 – 2017-10-14 (×2): 15 mL via OROMUCOSAL

## 2017-10-12 MED ORDER — AMLODIPINE BESYLATE 5 MG PO TABS
5.0000 mg | ORAL_TABLET | Freq: Every day | ORAL | Status: DC
  Administered 2017-10-12 – 2017-10-13 (×2): 5 mg via ORAL
  Filled 2017-10-12 (×2): qty 1

## 2017-10-12 MED ORDER — PANTOPRAZOLE SODIUM 40 MG PO TBEC
40.0000 mg | DELAYED_RELEASE_TABLET | Freq: Every day | ORAL | Status: DC
  Administered 2017-10-12 – 2017-10-14 (×3): 40 mg via ORAL
  Filled 2017-10-12 (×3): qty 1

## 2017-10-12 NOTE — Telephone Encounter (Signed)
I saw she was in the hospital and hope for a speedy recovery. She is receiving prilosec type medicine in the hospital through IV - ok to wait until she comes home to start prilosec generic.

## 2017-10-12 NOTE — Plan of Care (Signed)

## 2017-10-12 NOTE — Care Management (Signed)
Patient presents form [facility] - independent level of care - not assisted living.  PT consult is pending.  has received one unit of packed cells for acute on chronic anemia with hemoglobin of 6.8

## 2017-10-12 NOTE — Clinical Social Work Note (Signed)
CSW notified patient's daughter Jerlyn Ly [phone] that therapy is recommending SNF for patient. Daughter is in agreement with SNF placement at [facility]. CSW contacted Seth Bake at [facility] to notify that patient will need SNF at discharge. Seth Bake states that they will have a bed for patient when she is ready. CSW will continue to follow for discharge needs.   [place], [place]

## 2017-10-12 NOTE — Telephone Encounter (Signed)
Spoke with pt's daughters, Jeani Hawking (not on dpr) and [name] (on dpr), via conference call relaying Dr. Synthia Innocent message.  Verbalized understanding and expressed their thanks for calling back.  I suggested they have pt update dpr.

## 2017-10-12 NOTE — Progress Notes (Signed)
[place] [facility] [place] NAME: Teresa Meyer    MR#:  [id number]  DATE OF BIRTH:  [birth year]  SUBJECTIVE:  patient came in after mechanical fall at independent facility. She complains of the rib painon the left.  REVIEW OF SYSTEMS:   Review of Systems  Constitutional: Negative for chills, fever and weight loss.  HENT: Negative for ear discharge, ear pain and nosebleeds.   Eyes: Negative for blurred vision, pain and discharge.  Respiratory: Negative for sputum production, shortness of breath, wheezing and stridor.   Cardiovascular: Negative for chest pain, palpitations, orthopnea and PND.  Gastrointestinal: Negative for abdominal pain, diarrhea, nausea and vomiting.  Genitourinary: Negative for frequency and urgency.  Musculoskeletal: Positive for back pain. Negative for joint pain.  Neurological: Negative for sensory change, speech change, focal weakness and weakness.  Psychiatric/Behavioral: Negative for depression and hallucinations. The patient is not nervous/anxious.    Tolerating Diet: yes Tolerating PT: eval pending  DRUG ALLERGIES:   Allergies  Allergen Reactions  . Bisphosphonates Other (See Comments)    Dysphagia to oral bisphosphonates  . Codeine   . Statins Other (See Comments)    Muscle aches  . Sulfa Antibiotics     Can't recall reaction  . Procaine Hcl Palpitations    VITALS:  Blood pressure (!) 149/68, pulse 85, temperature 98.3 F (36.8 C), temperature source Oral, resp. rate 20, height 5' (1.524 m), weight 58.1 kg (128 lb 1.6 oz), SpO2 97 %.  PHYSICAL EXAMINATION:   Physical Exam  GENERAL:  84 y.o.-year-old patient lying in the bed with no acute distress. Thin frail EYES: Pupils equal, round, reactive to light and accommodation. No scleral icterus. Extraocular muscles intact.  HEENT: Head atraumatic, normocephalic. Oropharynx and nasopharynx clear.  NECK:  Supple, no jugular venous distention. No thyroid  enlargement, no tenderness.  LUNGS: Normal breath sounds bilaterally, no wheezing, rales, rhonchi. No use of accessory muscles of respiration.  CARDIOVASCULAR: S1, S2 normal. No murmurs, rubs, or gallops.  ABDOMEN: Soft, nontender, nondistended. Bowel sounds present. No organomegaly or mass.  EXTREMITIES: No cyanosis, clubbing or edema b/l.    NEUROLOGIC: Cranial nerves II through XII are intact. No focal Motor or sensory deficits b/l.   PSYCHIATRIC:  patient is alert and oriented x 3.  SKIN: No obvious rash, lesion, or ulcer.   LABORATORY PANEL:  CBC Recent Labs  Lab 10/12/17 0436  WBC 6.1  HGB 7.4*  HCT 21.6*  PLT 295    Chemistries  Recent Labs  Lab 10/10/17 1437  10/12/17 0436  NA 129*   < > 130*  K 3.6   < > 3.6  CL 93*   < > 100*  CO2 27   < > 23  GLUCOSE 119*   < > 98  BUN 13   < > 12  CREATININE 0.89   < > 0.81  CALCIUM 9.5   < > 7.8*  AST 15  --   --   ALT 8  --   --   ALKPHOS 40  --   --   BILITOT 0.3  --   --    < > = values in this interval not displayed.   Cardiac Enzymes No results for input(s): TROPONINI in the last 168 hours. RADIOLOGY:  Dg Ribs Unilateral W/chest Left  Result Date: 10/11/2017 CLINICAL DATA:  Fall EXAM: LEFT RIBS AND CHEST - 3+ VIEW COMPARISON:  03/17/2007 FINDINGS: Mild cardiomegaly. Aortic tortuosity. Mildly low lung  volumes with streaky atelectasis. Cortical irregularity of the lateral left fourth and fifth ribs without convincing complete fracture line. There is a posterior left 6th and lateral left seventh, ninth, and tenth rib fracture. IMPRESSION: 1. Left sixth to tenth rib fractures with mild displacement. Mild cortical irregularity of left fourth and fifth ribs, age-indeterminate. 2. Low volume chest with atelectasis. No hemothorax or pneumothorax. Electronically Signed   By: Monte Fantasia M.D.   On: 10/11/2017 12:33   ASSESSMENT AND PLAN:  84 year old female with history of osteoporosis who is status post mechanical fall and  found to have low hemoglobin.  1. Acute on chronic anemia/IDA Anemia panel ordered Patient consented and will receive 1 unit PRBC Hemoglobin 6.6--6.8--1 unit PRBC--7.4 Patient does not want aggressive management including GI consult. Can continue PPI which was started in ED for 24  Hours--then to po PPI Cont Iron pills Pt does not want IV iron  2.  Hyponatremia: This was due to HCTZ. Dose was increased due to lower extremity edema Gentle hydration and follow-up on sodium level tomorrow morning 129-127--130  3.  Essential hypertension: Hold HCTZ for now Start po amlodipine  4.  Hypokalemia: Replaced  5.  Rib pain, left side: Check x-ray showed left 6,7.9,10 th rib fracture Incentive spirometer ordered  PT recomends rehab.  Case discussed with Care Management/Social Worker. Management plans discussed with the patient, family and they are in agreement.  CODE STATUS: DNR  DVT Prophylaxis: *lovenox TOTAL TIME TAKING CARE OF THIS PATIENT: *30* minutes.  >50% time spent on counselling and coordination of care  POSSIBLE D/C IN 1-2 DAYS, DEPENDING ON CLINICAL CONDITION.  Note: This dictation was prepared with Dragon dictation along with smaller phrase technology. Any transcriptional errors that result from this process are unintentional.  Fritzi Mandes M.D on 10/12/2017 at 2:35 PM  Between 7am to 6pm - Pager - [phone]  After 6pm go to [email or web address] - password [email or web address] [facility]  Office  [phone]  CC: Primary care physician; [name], MDPatient ID: Teresa Meyer, female   DOB: 14-Apr-1923, 84 y.o.   MRN: [id number]

## 2017-10-12 NOTE — Evaluation (Signed)
Physical Therapy Evaluation Patient Details Name: Teresa Meyer MRN: [id number] DOB: [birth year] Today's Date: 10/12/2017   History of Present Illness  Pt is a89 y.o.femalewith a known history of lower extremity edema, esophageal stricture and essential hypertension who presents to the emergency room after fall. Patient was seen by her primary care physician for follow-up on her lower extremity edema. At that time she was complaining of some epigastric fullness. She was called from her primary care physician's officeyesterday due to low hemoglobinand was advised to continue PPI and start OTC iron tablet.  Pt comes to the emergency room from [facility] assisted facility with a mechanical fall. She denies syncope or fall to her head.She reports she was using her walker and her walker slipped over the small rug she had. She has multiple bruising and skin abrasions on her left arm and leg.  Assessment includes: acute on chronic anemia s/p one unit PRBC, hyponatremia, HTN, LE edema, hypokalemia, and L rib fractures due to fall.    Clinical Impression  Pt presents with deficits in strength, transfers, mobility, gait, balance, and activity tolerance.  Pt required extensive assist and time with bed mobility tasks secondary to L rib pain with pt requiring extensive encouragement with all movement.  Pt required min A for standing again with pt highly guarded and requiring time and encouragement to attempt transfer.  Once up pt was steady and was able to amb 20' with very slow, cautious cadence and short B step length.  Pt's SpO2 remained >/= 93% during the session with no adverse symptoms noted other than pain.  Pt will benefit from PT services in a SNF setting upon discharge to safely address above deficits for decreased caregiver assistance and eventual return to PLOF.        Follow Up Recommendations SNF;Supervision for mobility/OOB    Equipment Recommendations  Other (comment)(TBD at the next  venue of care)    Recommendations for Other Services       Precautions / Restrictions Precautions Precautions: Fall Restrictions Weight Bearing Restrictions: No      Mobility  Bed Mobility Overal bed mobility: Needs Assistance Bed Mobility: Supine to Sit;Sit to Supine     Supine to sit: Max assist Sit to supine: Max assist   General bed mobility comments: Mod verbal cues for sequencing with pt limited by L rib pain with movement in bed  Transfers Overall transfer level: Needs assistance Equipment used: Rolling walker (2 wheeled) Transfers: Sit to/from Stand Sit to Stand: Min assist         General transfer comment: Pt required max encouragement to stand along with min A and cues for sequencing; pt highly anxious during mobility secondary to concerns over L rib pain worsening with movement  Ambulation/Gait Ambulation/Gait assistance: Min guard Ambulation Distance (Feet): 20 Feet Assistive device: Rolling walker (2 wheeled) Gait Pattern/deviations: Step-through pattern;Decreased step length - right;Decreased step length - left;Trunk flexed Gait velocity: decreased   General Gait Details: Very slow, cautious cadence with short B step length but overall pt was steady with ambulation with no LOB  Stairs            Wheelchair Mobility    Modified Rankin (Stroke Patients Only)       Balance Overall balance assessment: No apparent balance deficits (not formally assessed)  Pertinent Vitals/Pain Pain Assessment: 0-10 Pain Score: 10-Worst pain ever Pain Location: L rib pain Pain Descriptors / Indicators: Aching;Sore Pain Intervention(s): RN gave pain meds during session;Patient requesting pain meds-RN notified;Monitored during session;Limited activity within patient's tolerance    Home Living Family/patient expects to be discharged to:: Private residence Living Arrangements: Alone Available Help at  Discharge: Personal care attendant;Family;Available PRN/intermittently Type of Home: Independent living facility(Ind living [facility]) Home Access: Level entry     Home Layout: One level Home Equipment: [place] - 4 wheels;Cane - single point      Prior Function Level of Independence: Independent with assistive device(s)         Comments: Mod Ind amb with a rollator with no other fall history, Ind with ADLs, PCA 3x/wk for 1 hr for misc help like cleaning or running errands/groceries     Hand Dominance        Extremity/Trunk Assessment   Upper Extremity Assessment Upper Extremity Assessment: Generalized weakness    Lower Extremity Assessment Lower Extremity Assessment: Generalized weakness       Communication   Communication: No difficulties  Cognition Arousal/Alertness: Awake/alert Behavior During Therapy: WFL for tasks assessed/performed Overall Cognitive Status: Within Functional Limits for tasks assessed                                        General Comments      Exercises Total Joint Exercises Ankle Circles/Pumps: AROM;Both;10 reps Quad Sets: Strengthening;Both;10 reps Hip ABduction/ADduction: AAROM;Both;5 reps Straight Leg Raises: AAROM;5 reps;Both Long Arc Quad: AROM;Both;5 reps;10 reps Knee Flexion: AROM;Both;5 reps;10 reps Marching in Standing: AROM;Both;5 reps   Assessment/Plan    PT Assessment Patient needs continued PT services  PT Problem List Decreased strength;Decreased activity tolerance;Decreased mobility;Decreased knowledge of use of DME;Pain       PT Treatment Interventions DME instruction;Gait training;Functional mobility training;Therapeutic exercise;Therapeutic activities;Balance training;Patient/family education    PT Goals (Current goals can be found in the Care Plan section)  Acute Rehab PT Goals Patient Stated Goal: To get up and walk better PT Goal Formulation: With patient Time For Goal Achievement:  10/25/17 Potential to Achieve Goals: Good    Frequency Min 2X/week   Barriers to discharge Decreased caregiver support      Co-evaluation               AM-PAC PT "6 Clicks" Daily Activity  Outcome Measure Difficulty turning over in bed (including adjusting bedclothes, sheets and blankets)?: Unable Difficulty moving from lying on back to sitting on the side of the bed? : Unable Difficulty sitting down on and standing up from a chair with arms (e.g., wheelchair, bedside commode, etc,.)?: Unable Help needed moving to and from a bed to chair (including a wheelchair)?: A Little Help needed walking in hospital room?: A Little Help needed climbing 3-5 steps with a railing? : A Lot 6 Click Score: 11    End of Session Equipment Utilized During Treatment: Gait belt Activity Tolerance: Patient limited by pain Patient left: Other (comment)(Pt left with CNA for toileting) Nurse Communication: Mobility status PT Visit Diagnosis: Difficulty in walking, not elsewhere classified (R26.2);Muscle weakness (generalized) (M62.81)    Time: 4496-7591 PT Time Calculation (min) (ACUTE ONLY): 38 min   Charges:   PT Evaluation $PT Eval Low Complexity: 1 Low PT Treatments $Therapeutic Exercise: 8-22 mins   PT G Codes:  Linus Salmons PT, DPT 10/12/17, 11:23 AM

## 2017-10-12 NOTE — Telephone Encounter (Signed)
Copied from [place]. Topic: Quick Communication - See Telephone Encounter >> Oct 12, 2017  1:12 PM Hewitt Shorts wrote: Pt daughter lynn call to let Dr. Danise Mina know that patient is in the hospital due to a fall and has broken  a couple of ribs-she also wants to know if the new meds generic for prilosec needs to be started now or when she  Gets out the [place] number for lynn is [phone]

## 2017-10-13 MED ORDER — LIDOCAINE 5 % EX PTCH
1.0000 | MEDICATED_PATCH | CUTANEOUS | Status: DC
  Administered 2017-10-13 – 2017-10-14 (×2): 1 via TRANSDERMAL
  Filled 2017-10-13 (×2): qty 1

## 2017-10-13 MED ORDER — AMLODIPINE BESYLATE 10 MG PO TABS: 10.0000 mg | ORAL_TABLET | Freq: Every day | ORAL | Status: DC

## 2017-10-13 MED ORDER — LISINOPRIL 10 MG PO TABS
10.0000 mg | ORAL_TABLET | Freq: Every day | ORAL | Status: DC
Start: 2017-10-13 — End: 2017-10-14
  Administered 2017-10-13 – 2017-10-14 (×2): 10 mg via ORAL
  Filled 2017-10-13 (×2): qty 1

## 2017-10-13 MED ORDER — FERROUS SULFATE 325 (65 FE) MG PO TABS
325.0000 mg | ORAL_TABLET | Freq: Two times a day (BID) | ORAL | Status: DC
  Administered 2017-10-13 – 2017-10-14 (×2): 325 mg via ORAL
  Filled 2017-10-13 (×2): qty 1

## 2017-10-13 MED ORDER — DOCUSATE SODIUM 100 MG PO CAPS
100.0000 mg | ORAL_CAPSULE | Freq: Two times a day (BID) | ORAL | Status: DC
  Administered 2017-10-14: 09:00:00 100 mg via ORAL
  Filled 2017-10-13 (×2): qty 1

## 2017-10-13 NOTE — Plan of Care (Signed)

## 2017-10-13 NOTE — Clinical Social Work Note (Signed)
Patient's daughter Jerlyn Ly [phone] called CSW today to confirm that patient will go back to [facility]. CSW told daughter that per MD this morning, the plan is to discharge back to facility tomorrow. CSW informed daughter that MD would likely discharge in the morning and patient would be transported via EMS tomorrow when they are able. Daughter thanked CSW for assistance. CSW will continue to follow for discharge planning.   Annamaria Boots MSW, [place] [phone]

## 2017-10-13 NOTE — Progress Notes (Signed)
[facility] NAME: Teresa Meyer    MR#:  [id number]  DATE OF BIRTH:  [birth year]  SUBJECTIVE:  patient came in after mechanical fall at independent facility. She complains of the rib pain on the left. Does not want to use any pain meds stronger than tylenol REVIEW OF SYSTEMS:   Review of Systems  Constitutional: Negative for chills, fever and weight loss.  HENT: Negative for ear discharge, ear pain and nosebleeds.   Eyes: Negative for blurred vision, pain and discharge.  Respiratory: Negative for sputum production, shortness of breath, wheezing and stridor.   Cardiovascular: Negative for chest pain, palpitations, orthopnea and PND.  Gastrointestinal: Negative for abdominal pain, diarrhea, nausea and vomiting.  Genitourinary: Negative for frequency and urgency.  Musculoskeletal: Positive for back pain. Negative for joint pain.  Neurological: Negative for sensory change, speech change, focal weakness and weakness.  Psychiatric/Behavioral: Negative for depression and hallucinations. The patient is not nervous/anxious.    Tolerating Diet: yes Tolerating PT:STR  DRUG ALLERGIES:   Allergies  Allergen Reactions  . Bisphosphonates Other (See Comments)    Dysphagia to oral bisphosphonates  . Codeine   . Statins Other (See Comments)    Muscle aches  . Sulfa Antibiotics     Can't recall reaction  . Procaine Hcl Palpitations    VITALS:  Blood pressure (!) 171/72, pulse 90, temperature 98.4 F (36.9 C), temperature source Oral, resp. rate 16, height 5' (1.524 m), weight 57.6 kg (126 lb 15.8 oz), SpO2 94 %.  PHYSICAL EXAMINATION:   Physical Exam  GENERAL:  84 y.o.-year-old patient lying in the bed with no acute distress. Thin frail EYES: Pupils equal, round, reactive to light and accommodation. No scleral icterus. Extraocular muscles intact.  HEENT: Head atraumatic, normocephalic. Oropharynx and nasopharynx clear.  NECK:   Supple, no jugular venous distention. No thyroid enlargement, no tenderness.  LUNGS: Normal breath sounds bilaterally, no wheezing, rales, rhonchi. No use of accessory muscles of respiration.  CARDIOVASCULAR: S1, S2 normal. No murmurs, rubs, or gallops.  ABDOMEN: Soft, nontender, nondistended. Bowel sounds present. No organomegaly or mass.  EXTREMITIES: No cyanosis, clubbing or edema b/l.    NEUROLOGIC: Cranial nerves II through XII are intact. No focal Motor or sensory deficits b/l.   PSYCHIATRIC:  patient is alert and oriented x 3.  SKIN: No obvious rash, lesion, or ulcer.   LABORATORY PANEL:  CBC Recent Labs  Lab 10/12/17 0436  WBC 6.1  HGB 7.4*  HCT 21.6*  PLT 295    Chemistries  Recent Labs  Lab 10/10/17 1437  10/12/17 0436  NA 129*   < > 130*  K 3.6   < > 3.6  CL 93*   < > 100*  CO2 27   < > 23  GLUCOSE 119*   < > 98  BUN 13   < > 12  CREATININE 0.89   < > 0.81  CALCIUM 9.5   < > 7.8*  AST 15  --   --   ALT 8  --   --   ALKPHOS 40  --   --   BILITOT 0.3  --   --    < > = values in this interval not displayed.   Cardiac Enzymes No results for input(s): TROPONINI in the last 168 hours. RADIOLOGY:  Dg Ribs Unilateral W/chest Left  Result Date: 10/11/2017 CLINICAL DATA:  Fall EXAM: LEFT RIBS AND CHEST - 3+ VIEW COMPARISON:  03/17/2007 FINDINGS: Mild cardiomegaly. Aortic tortuosity. Mildly low lung volumes with streaky atelectasis. Cortical irregularity of the lateral left fourth and fifth ribs without convincing complete fracture line. There is a posterior left 6th and lateral left seventh, ninth, and tenth rib fracture. IMPRESSION: 1. Left sixth to tenth rib fractures with mild displacement. Mild cortical irregularity of left fourth and fifth ribs, age-indeterminate. 2. Low volume chest with atelectasis. No hemothorax or pneumothorax. Electronically Signed   By: Monte Fantasia M.D.   On: 10/11/2017 12:33   ASSESSMENT AND PLAN:  84 year old female with history of  osteoporosis who is status post mechanical fall and found to have low hemoglobin.  1. Acute on chronic anemia/IDA Anemia panel ordered Patient consented and will receive 1 unit PRBC Hemoglobin 6.6--6.8--1 unit PRBC--7.4 Patient does not want aggressive management including GI consult. Can continue PPI  Gtt which was started in ED for 24  Hours--then now on po PPI Cont Iron pills Pt does not want IV iron  2.  Hyponatremia: This was due to HCTZ. Dose was increased due to lower extremity edema Received Gentle hydration and follow-up on sodium level tomorrow morning 129-127--130  3.  Essential hypertension: Hold HCTZ for now Start po amlodipine -bp some elevated due to rib pain  4.  Hypokalemia: Replaced  5.  Rib pain, left side: Check x-ray showed left 6,7.9,10 th rib fracture Incentive spirometer ordered Lidocaine patch locally  PT recomends rehab. To [facility] tomorrow  Case discussed with Care Management/Social Worker. Management plans discussed with the patient, family and they are in agreement.  CODE STATUS: DNR  DVT Prophylaxis: *lovenox TOTAL TIME TAKING CARE OF THIS PATIENT: *30* minutes.  >50% time spent on counselling and coordination of care  POSSIBLE D/C IN 1-2 DAYS, DEPENDING ON CLINICAL CONDITION.  Note: This dictation was prepared with Dragon dictation along with smaller phrase technology. Any transcriptional errors that result from this process are unintentional.  Fritzi Mandes M.D on 10/13/2017 at 9:35 AM  Between 7am to 6pm - Pager - [phone]  After 6pm go to [email or web address] - password [email or web address] [facility]  Office  [phone]  CC: Primary care physician; [name], MDPatient ID: Teresa Meyer, female   DOB: 07-27-1922, 84 y.o.   MRN: [id number]

## 2017-10-13 NOTE — Progress Notes (Signed)
Physical Therapy Treatment Patient Details Name: Teresa Meyer MRN: [id number] DOB: [birth year] Today's Date: 10/13/2017    History of Present Illness Pt is a89 y.o.femalewith a known history of lower extremity edema, esophageal stricture and essential hypertension who presents to the emergency room after fall. Patient was seen by her primary care physician for follow-up on her lower extremity edema. At that time she was complaining of some epigastric fullness. She was called from her primary care physician's officeyesterday due to low hemoglobinand was advised to continue PPI and start OTC iron tablet.  Pt comes to the emergency room from [facility] assisted facility with a mechanical fall. She denies syncope or fall to her head.She reports she was using her walker and her walker slipped over the small rug she had. She has multiple bruising and skin abrasions on her left arm and leg.  Assessment includes: acute on chronic anemia s/p one unit PRBC, hyponatremia, HTN, LE edema, hypokalemia, and L rib fractures due to fall.    PT Comments    Pt complains of pain and general discomfort.  Agrees to out of bed.  Good effort rolling but unable to bring shoulders up to sit.  Once sitting able to maintain balance without assist.  Stood and transferred to recliner at bedside with min guard.  Slow movements limited by pain.  Pt declined gait at this time but nurse tech reports gait to bathroom this am.   Follow Up Recommendations  SNF;Supervision for mobility/OOB     Equipment Recommendations       Recommendations for Other Services       Precautions / Restrictions Precautions Precautions: Fall Restrictions Weight Bearing Restrictions: No    Mobility  Bed Mobility Overal bed mobility: Needs Assistance Bed Mobility: Supine to Sit     Supine to sit: Mod assist Sit to supine: Mod assist      Transfers Overall transfer level: Needs assistance Equipment used: Rolling walker (2  wheeled) Transfers: Sit to/from Stand Sit to Stand: Min guard            Ambulation/Gait         Gait velocity: decreased Gait velocity interpretation: <1.8 ft/sec, indicate of risk for recurrent falls General Gait Details: declined gait but nursing reports walking to from bahtroom this am   Stairs             Wheelchair Mobility    Modified Rankin (Stroke Patients Only)       Balance Overall balance assessment: No apparent balance deficits (not formally assessed)                                          Cognition Arousal/Alertness: Awake/alert Behavior During Therapy: WFL for tasks assessed/performed Overall Cognitive Status: Within Functional Limits for tasks assessed                                        Exercises      General Comments        Pertinent Vitals/Pain Pain Assessment: Faces Faces Pain Scale: Hurts whole lot Pain Location: L rib pain Pain Descriptors / Indicators: Aching;Sore Pain Intervention(s): Limited activity within patient's tolerance    Home Living  Prior Function            PT Goals (current goals can now be found in the care plan section) Progress towards PT goals: Progressing toward goals    Frequency    Min 2X/week      PT Plan Current plan remains appropriate    Co-evaluation              AM-PAC PT "6 Clicks" Daily Activity  Outcome Measure  Difficulty turning over in bed (including adjusting bedclothes, sheets and blankets)?: Unable Difficulty moving from lying on back to sitting on the side of the bed? : Unable Difficulty sitting down on and standing up from a chair with arms (e.g., wheelchair, bedside commode, etc,.)?: Unable Help needed moving to and from a bed to chair (including a wheelchair)?: A Little Help needed walking in hospital room?: A Little Help needed climbing 3-5 steps with a railing? : A Lot 6 Click Score: 11     End of Session Equipment Utilized During Treatment: Gait belt Activity Tolerance: Patient limited by pain   Nurse Communication: Mobility status       Time: 1037-1050 PT Time Calculation (min) (ACUTE ONLY): 13 min  Charges:  $Therapeutic Activity: 8-22 mins                    G Codes:       Chesley Noon, PTA 10/13/17, 11:19 AM

## 2017-10-14 MED ORDER — LIDOCAINE 5 % EX PTCH: 1.0000 | MEDICATED_PATCH | CUTANEOUS | 0 refills | Status: DC

## 2017-10-14 MED ORDER — LISINOPRIL 10 MG PO TABS: 10.0000 mg | ORAL_TABLET | Freq: Every day | ORAL | 1 refills | Status: DC

## 2017-10-14 NOTE — Plan of Care (Signed)
Pt d/ced to [facility].  Will transport via EMS - called.  IV removed.  Called report to [facility] [employer] [facility].  She's going to Rm 215.  Pt is A&Ox4.  She is having L posterior chest pain from broken ribs, when she coughs.   She will only take tylenol for pain.  Did get up to [facility] w/assistance.  Nurse Tech showed her how to brace herself w/pillow for comfort when she coughs.  Changed dressing on skin tears on L upper posterior arm, L elbow and L knee - applied pink foam dressings.

## 2017-10-14 NOTE — Clinical Social Work Note (Signed)
Patient to be d/c'ed today to [facility] room 215.  Patient and family agreeable to plans will transport via ems RN to call report [phone]. CSW updated patient's daughter [name] [phone], and she is aware that patient is discharging today.   Evette Cristal, MSW, [place]

## 2017-10-14 NOTE — Care Management Important Message (Signed)
Important Message  Patient Details  Name: Teresa Meyer MRN: [id number] Date of Birth: [birth year]   Medicare Important Message Given:  Yes    [name] A Dellie Piasecki 10/14/2017, 9:43 AM

## 2017-10-14 NOTE — Clinical Social Work Placement (Signed)
   CLINICAL SOCIAL WORK PLACEMENT  NOTE  Date:  10/14/2017  Patient Details  Name: Teresa Meyer MRN: [id number] Date of Birth: 01-08-1923  Clinical Social Work is seeking post-discharge placement for this patient at the [place] level of care (*CSW will initial, date and re-position this form in  chart as items are completed):  Yes   Patient/family provided with [place] Work Department's list of facilities offering this level of care within the geographic area requested by the patient (or if unable, by the patient's family).  Yes   Patient/family informed of their freedom to choose among providers that offer the needed level of care, that participate in Medicare, Medicaid or managed care program needed by the patient, have an available bed and are willing to accept the patient.  Yes   Patient/family informed of [facility]'s ownership interest in [facility] and [facility], as well as of the fact that they are under no obligation to receive care at these facilities.  PASRR submitted to EDS on 10/14/17     PASRR number received on       Existing PASRR number confirmed on 10/14/17     FL2 transmitted to all facilities in geographic area requested by pt/family on       FL2 transmitted to all facilities within larger geographic area on 10/14/17     Patient informed that his/her managed care company has contracts with or will negotiate with certain facilities, including the following:        Yes   Patient/family informed of bed offers received.  Patient chooses bed at [facility]     Physician recommends and patient chooses bed at      Patient to be transferred to [facility] on 10/14/17.  Patient to be transferred to facility by [facility]     Patient family notified on 10/14/17 of transfer.  Name of family member notified:  Daughter [name]     PHYSICIAN Please sign DNR, Please sign FL2     Additional Comment:     _______________________________________________ Ross Ludwig, LCSWA 10/14/2017, 9:20 AM

## 2017-10-14 NOTE — Discharge Summary (Signed)
[facility] NAME: Teresa Meyer    MR#:  [id number]  DATE OF BIRTH:  Aug 23, 1922  DATE OF ADMISSION:  10/11/2017 ADMITTING PHYSICIAN: Bettey Costa, MD  DATE OF DISCHARGE: 10/14/2017  PRIMARY CARE PHYSICIAN: [name], MD    ADMISSION DIAGNOSIS:  Hyponatremia [E87.1] Fall [W19.XXXA] Fall, initial encounter [id number]] Anemia, unspecified type [D64.9]  DISCHARGE DIAGNOSIS:  Left 6,7,9,10 rib fracture s/p mechanical fall Acute on Chronic Anemia/IDA--pt does not want any GI w/u done  SECONDARY DIAGNOSIS:   Past Medical History:  Diagnosis Date  . Arthritis   . Atypical nevi 09/2014   lentiginous dysplastic nevus with mod atypia ([place])  . Diverticulitis   . DNR (do not resuscitate)   . Esophageal stricture 2012  . GERD (gastroesophageal reflux disease)   . HLD (hyperlipidemia)    did not tolerate statin, stopped checking  . HTN (hypertension)   . Malignant melanoma ([place]) 2015, 2017   R chin s/p excision ([place]) R upper back [name])  . Osteoporosis, post-menopausal    bisphosphonate caused dysphagia, requests defer DEXA for now  . Seasonal allergies   . Wears hearing aid     HOSPITAL COURSE:   84 year old female with history of osteoporosis who is status post mechanical fall and found to have low hemoglobin.  1.Acute on chronic anemia/IDA Anemia panel ordered Patient consented and will receive 1 unit PRBC Hemoglobin 6.6--6.8--1 unit PRBC--7.4 Patient does not want aggressive management including GI consult. Can continue PPI  Gtt which was started in ED for 24  Hours--then now on po PPI Cont Iron pills Pt does not want IV iron  2. Hyponatremia: This was due to HCTZ. Dose was increased due to lower extremity edema Received Gentle hydration and follow-up on sodium level tomorrow morning 129-127--130  3. Essential hypertension: Hold HCTZ for now Now on po lisinopril -bp some elevated due to rib  pain  4. Hypokalemia: Replaced  5. Rib pain, left side: Check x-ray showed left 6,7.9,10 th rib fracture Incentive spirometer ordered Lidocaine patch locally  PT recomends rehab. To [facility] today. Pt agreeable  CONSULTS OBTAINED:    DRUG ALLERGIES:   Allergies  Allergen Reactions  . Bisphosphonates Other (See Comments)    Dysphagia to oral bisphosphonates  . Codeine   . Statins Other (See Comments)    Muscle aches  . Sulfa Antibiotics     Can't recall reaction  . Procaine Hcl Palpitations    DISCHARGE MEDICATIONS:   Allergies as of 10/14/2017      Reactions   Bisphosphonates Other (See Comments)   Dysphagia to oral bisphosphonates   Codeine    Statins Other (See Comments)   Muscle aches   Sulfa Antibiotics    Can't recall reaction   Procaine Hcl Palpitations      Medication List    STOP taking these medications   hydrochlorothiazide 25 MG tablet Commonly known as:  HYDRODIURIL     TAKE these medications   calcium carbonate 500 MG chewable tablet Commonly known as:  TUMS - dosed in mg elemental calcium Chew 1 tablet by mouth 2 (two) times daily with a meal.   cholecalciferol 1000 units tablet Commonly known as:  VITAMIN D Take 1,000 Units by mouth daily.   diphenhydramine-acetaminophen 25-500 MG Tabs tablet Commonly known as:  TYLENOL PM Take 2 tablets by mouth at bedtime as needed.   ferrous sulfate 325 (65 FE) MG tablet Take 1 tablet (325 mg total)  by mouth once a week. What changed:  when to take this   lidocaine 5 % Commonly known as:  LIDODERM Place 1 patch onto the skin daily. Remove & Discard patch within 12 hours or as directed by MD   lisinopril 10 MG tablet Commonly known as:  PRINIVIL,ZESTRIL Take 1 tablet (10 mg total) by mouth daily.   omeprazole 40 MG capsule Commonly known as:  PRILOSEC Take 1 capsule (40 mg total) by mouth daily. Take 30 min before a meal   prednisoLONE acetate 0.12 % ophthalmic suspension Commonly  known as:  PRED MILD Place 1 drop into the left eye daily.   vitamin B-12 1000 MCG tablet Commonly known as:  CYANOCOBALAMIN Take 1 tablet (1,000 mcg total) by mouth 2 (two) times a week.       If you experience worsening of your admission symptoms, develop shortness of breath, life threatening emergency, suicidal or homicidal thoughts you must seek medical attention immediately by calling 911 or calling your MD immediately  if symptoms less severe.  You Must read complete instructions/literature along with all the possible adverse reactions/side effects for all the Medicines you take and that have been prescribed to you. Take any new Medicines after you have completely understood and accept all the possible adverse reactions/side effects.   Please note  You were cared for by a hospitalist during your hospital stay. If you have any questions about your discharge medications or the care you received while you were in the hospital after you are discharged, you can call the unit and asked to speak with the hospitalist on call if the hospitalist that took care of you is not available. Once you are discharged, your primary care physician will handle any further medical issues. Please note that NO REFILLS for any discharge medications will be authorized once you are discharged, as it is imperative that you return to your primary care physician (or establish a relationship with a primary care physician if you do not have one) for your aftercare needs so that they can reassess your need for medications and monitor your lab values. Today   SUBJECTIVE  No new complaints  VITAL SIGNS:  Blood pressure (!) 147/62, pulse 84, temperature 98.6 F (37 C), temperature source Oral, resp. rate (!) 21, height 5' (1.524 m), weight 57.7 kg (127 lb 1.6 oz), SpO2 96 %.  I/O:    Intake/Output Summary (Last 24 hours) at 10/14/2017 0840 Last data filed at 10/13/2017 1841 Gross per 24 hour  Intake 600 ml  Output -   Net 600 ml    PHYSICAL EXAMINATION:  GENERAL:  84 y.o.-year-old patient lying in the bed with no acute distress.  EYES: Pupils equal, round, reactive to light and accommodation. No scleral icterus. Extraocular muscles intact.  HEENT: Head atraumatic, normocephalic. Oropharynx and nasopharynx clear.  NECK:  Supple, no jugular venous distention. No thyroid enlargement, no tenderness.  LUNGS: Normal breath sounds bilaterally, no wheezing, rales,rhonchi or crepitation. No use of accessory muscles of respiration.  CARDIOVASCULAR: S1, S2 normal. No murmurs, rubs, or gallops.  ABDOMEN: Soft, non-tender, non-distended. Bowel sounds present. No organomegaly or mass.  EXTREMITIES: No pedal edema, cyanosis, or clubbing.  NEUROLOGIC: Cranial nerves II through XII are intact. Muscle strength 5/5 in all extremities. Sensation intact. Gait not checked.  PSYCHIATRIC: The patient is alert and oriented x 3.  SKIN: No obvious rash, lesion, or ulcer.   DATA REVIEW:   CBC  Recent Labs  Lab 10/12/17 [phone]  WBC 6.1  HGB 7.4*  HCT 21.6*  PLT 295    Chemistries  Recent Labs  Lab 10/10/17 1437  10/12/17 0436  NA 129*   < > 130*  K 3.6   < > 3.6  CL 93*   < > 100*  CO2 27   < > 23  GLUCOSE 119*   < > 98  BUN 13   < > 12  CREATININE 0.89   < > 0.81  CALCIUM 9.5   < > 7.8*  AST 15  --   --   ALT 8  --   --   ALKPHOS 40  --   --   BILITOT 0.3  --   --    < > = values in this interval not displayed.    Microbiology Results   No results found for this or any previous visit (from the past 240 hour(s)).  RADIOLOGY:  No results found.   Management plans discussed with the patient, family and they are in agreement.  CODE STATUS:     Code Status Orders  (From admission, onward)        Start     Ordered   10/11/17 1222  Do not attempt resuscitation (DNR)  Continuous    Question Answer Comment  In the event of cardiac or respiratory ARREST Do not call a "code blue"   In the event of  cardiac or respiratory ARREST Do not perform Intubation, CPR, defibrillation or ACLS   In the event of cardiac or respiratory ARREST Use medication by any route, position, wound care, and other measures to relive pain and suffering. May use oxygen, suction and manual treatment of airway obstruction as needed for comfort.      10/11/17 1221    Code Status History    This patient has a current code status but no historical code status.    Advance Directive Documentation     Most Recent Value  Type of Advance Directive  Out of facility DNR (pink MOST or yellow form)  Pre-existing out of facility DNR order (yellow form or pink MOST form)  Physician notified to receive inpatient order  "MOST" Form in Place?  -      TOTAL TIME TAKING CARE OF THIS PATIENT: *40* minutes.    Fritzi Mandes M.D on 10/14/2017 at 8:40 AM  Between 7am to 6pm - Pager - [phone] After 6pm go to [email or web address] - password [email or web address]  Office  [phone]  CC: Primary care physician; [name], MD

## 2017-10-18 DIAGNOSIS — I10 Essential (primary) hypertension: Secondary | ICD-10-CM | POA: Diagnosis not present

## 2017-10-18 DIAGNOSIS — W19XXXA Unspecified fall, initial encounter: Secondary | ICD-10-CM | POA: Diagnosis not present

## 2017-10-18 DIAGNOSIS — D649 Anemia, unspecified: Secondary | ICD-10-CM

## 2017-10-18 DIAGNOSIS — S2249XA Multiple fractures of ribs, unspecified side, initial encounter for closed fracture: Secondary | ICD-10-CM

## 2017-10-18 DIAGNOSIS — K219 Gastro-esophageal reflux disease without esophagitis: Secondary | ICD-10-CM | POA: Diagnosis not present

## 2017-10-19 ENCOUNTER — Inpatient Hospital Stay: Payer: Medicare Other | Admitting: Family Medicine

## 2017-10-26 ENCOUNTER — Telehealth: Payer: Self-pay | Admitting: Family Medicine

## 2017-10-26 ENCOUNTER — Other Ambulatory Visit: Payer: Self-pay | Admitting: Internal Medicine

## 2017-10-26 DIAGNOSIS — D649 Anemia, unspecified: Secondary | ICD-10-CM

## 2017-10-26 NOTE — Telephone Encounter (Signed)
Copied from [place] [id number]. Topic: Quick Communication - See Telephone Encounter >> Oct 26, 2017  9:57 AM Bea Graff, NT wrote: CRM for notification. See Telephone encounter for: 10/26/17. [facility] called this morning regarding a referral that was sent for this pts nephew that is already being treated there. Please advise below emails between Dr. Silvio Pate and [name]. Referral needed for this pt.       Thanks for the referral.   I too spoke to my cousin...yeah, not my first choice, but I understand your point.   Thanks for your help.   Mac      Venia Carbon, MD  to Vernie Ammons        7:59 AM  I put in the referral.  I also saw your cousin at [facility] yesterday and spoke to her for a while.  If she passed the test yesterday, despite the fatigue, I think we need to let her go home.  It would be best to get a lot more help for her at first------ I often recommend starting with 3-4 hours twice a day and weaning down if she does well.    Last read by Vernie Ammons at 8:35 AM on 10/26/2017.  October 25, 2017         3:47 PM  Pilar Grammes, CMA routed this conversation to Venia Carbon, MD    Vernie Ammons  to Venia Carbon, MD        3:45 PM  Dr Silvio Pate,   After talking with my cousins, yes, we would like to pursue a consultation with Dr. Mike Gip as soon as possible. If you could arrange that we would be most grateful.  We were told today that My aunt was going to be discharged back to the [facility]. Has anybody talked to you about this? With her hemoglobin level as it is, our conversation last Tuesday was that she should not be going back to the [facility] and assisted living may not be an option either. They have taken no blood for testing to our knowledge, so the hemoglobin level is the same or unknown. She will have three visits a day from care givers for one hour each. Almost nothing!  When this was explained to the director of  Rehab, my cousin got a blank stare.  My aunt was taken to the [facility] and given some simple tasks to do, and she did them, My cousin said she was exhausted after them (low endurance = low hemoglobin).  Obviously, I am concerned....   Encarnacion Slates    October 24, 2017   Venia Carbon, MD  to Vernie Ammons        1:02 PM  okay    Last read by Vernie Ammons at 3:46 PM on 10/25/2017.         11:45 AM  [name] Kitten, CMA routed this conversation to Venia Carbon, MD    Vernie Ammons  to Venia Carbon, MD        11:09 AM  Thank you for your quick reply.  I will discuss the options with her daughters and then come up with a plan for your approval.     Venia Carbon, MD  to Vernie Ammons        10:25 AM  I understand her stress and didn't take it the wrong way (I really like her---I should be so good if  I make it that long).  Yes, I think a consultation with Dr Mike Gip would be the next step--but she would need a bone marrow biopsy to start, and I didn't get the impression she wanted to do all that at this point. We can certainly set up an appointment with her to discuss what else can or should be done though.    Last read by Vernie Ammons at 3:45 PM on 10/25/2017.         9:20 AM  Pilar Grammes, CMA routed this conversation to Venia Carbon, MD    Vernie Ammons  to Venia Carbon, MD        9:16 AM  Dr. Silvio Pate,   Regarding my aunt, Johnnette Gourd) the low hemoglobin being , as you said, suggestive of a bone marrow issue (because of the iron level being normal, what treatment can she have to remedy this condition? Should she see a specialist like Dr. Mike Gip? She really isn't as ornery as she seemed the other day. After 89 years, she is set in her ways, and this completely takes her out of her comfort zone.   I look forward to hearing from you, and thanks.   Mac Higinio Plan

## 2017-10-26 NOTE — Telephone Encounter (Addendum)
Rosaria Ferries in the phone note a referral was placed. Rosaria Ferries cannot find referral and will send to Dr Silvio Pate.

## 2017-10-26 NOTE — Telephone Encounter (Signed)
Can we place her Wednesday 10-10:30am? Thanks.  plz notify [facility].

## 2017-10-26 NOTE — Progress Notes (Signed)
Please cancel the referral made in error for her nephew Darolyn Rua sent me email and I put the order on his chart (have them cancel it)

## 2017-10-26 NOTE — Telephone Encounter (Signed)
This has been taken care of.

## 2017-10-26 NOTE — Telephone Encounter (Signed)
See below Can pt be worked in?  If so where  Copied from [place] [phone]. Topic: General - Other >> Oct 26, 2017  1:25 PM Mcneil, Ja-Kwan wrote: Reason for CRM: Elayne Snare with [facility] states pt needs to schedule an appt for Rehabilitation follow up as she will be released from the health center on 10/27/17. There is no availability next week to schedule 30 min appt. Please contact pt to schedule rehab follow up. Mariann Laster also requests that some documentation is faxed to [phone] acknowledging that pt will be contacted to schedule a rehab follow up appt.

## 2017-10-27 NOTE — Telephone Encounter (Signed)
Appointment 6/12 Pt aware Spoke to wanda she stated just make sure pt is aware of appointment

## 2017-11-02 ENCOUNTER — Telehealth: Payer: Self-pay

## 2017-11-02 ENCOUNTER — Encounter: Payer: Self-pay | Admitting: Family Medicine

## 2017-11-02 ENCOUNTER — Ambulatory Visit (INDEPENDENT_AMBULATORY_CARE_PROVIDER_SITE_OTHER): Payer: Medicare Other | Admitting: Family Medicine

## 2017-11-02 VITALS — BP 124/82 | HR 72 | Temp 97.8°F | Ht 60.0 in | Wt 120.2 lb

## 2017-11-02 DIAGNOSIS — I10 Essential (primary) hypertension: Secondary | ICD-10-CM | POA: Diagnosis not present

## 2017-11-02 DIAGNOSIS — Z66 Do not resuscitate: Secondary | ICD-10-CM

## 2017-11-02 DIAGNOSIS — Y92009 Unspecified place in unspecified non-institutional (private) residence as the place of occurrence of the external cause: Secondary | ICD-10-CM | POA: Diagnosis not present

## 2017-11-02 DIAGNOSIS — D539 Nutritional anemia, unspecified: Secondary | ICD-10-CM

## 2017-11-02 DIAGNOSIS — E871 Hypo-osmolality and hyponatremia: Secondary | ICD-10-CM | POA: Diagnosis not present

## 2017-11-02 DIAGNOSIS — S2242XA Multiple fractures of ribs, left side, initial encounter for closed fracture: Secondary | ICD-10-CM | POA: Diagnosis not present

## 2017-11-02 DIAGNOSIS — S2249XA Multiple fractures of ribs, unspecified side, initial encounter for closed fracture: Secondary | ICD-10-CM

## 2017-11-02 DIAGNOSIS — W19XXXA Unspecified fall, initial encounter: Secondary | ICD-10-CM

## 2017-11-02 HISTORY — DX: Multiple fractures of ribs, unspecified side, initial encounter for closed fracture: S22.49XA

## 2017-11-02 MED ORDER — LISINOPRIL 10 MG PO TABS: 10.0000 mg | ORAL_TABLET | Freq: Every day | ORAL | 3 refills | Status: DC

## 2017-11-02 NOTE — Assessment & Plan Note (Signed)
Reviewed new BP meds - she has been taking HCTZ at home. Advised stop this and start lisinopril 10mg  daily - to improve hyponatremia.

## 2017-11-02 NOTE — Progress Notes (Signed)
BP 124/82 (BP Location: Right Arm, Patient Position: Sitting, Cuff Size: Normal)   Pulse 72   Temp 97.8 F (36.6 C) (Oral)   Ht 5' (1.524 m)   Wt 120 lb 4 oz (54.5 kg)   SpO2 99%   BMI 23.48 kg/m    CC: SNF f/u visit Subjective:    Patient ID: Teresa Meyer, female    DOB: 04-Apr-1923, 84 y.o.   MRN: [id number]  HPI: Teresa Meyer is a 84 y.o. female presenting on 11/02/2017 for Follow-up (Recently d/c from [facility] rehab. Wants to discuss DNR. Wants left LE checked.  Pt accompanied by her aide, [name].)   Recent admission 10/11/2017 after fall with L 6-10 rib fractures along with acute on chronic anemia. Also had large L leg abrasion. She does not remember details of how she fell - but thinks she tripped on rug. Was down for hours - now wearing safety alert and has call bell at reach. Discharged on 5/24 to [facility]. Returned home 10/28/2017.  She did receive 1u pRBC transfusion during hospitalization. Discharge Hgb 7.4. Had declined GI eval. Iron studies more suggestive of bone marrow issue. Dr Silvio Pate spoke with patient and family regarding this - family wanted further evaluation, pt had declined.   She has hematology appointment for tomorrow.  She had rehab PT - unsure if [facility] therapist gave her HEP to continue.  She uses walker and cane at home.  Taking tylenol 1000mg  Q8 hours, Tylenol PM at bedtime.  Hyponatremia - HCTZ was discontinued, lasix started.   No TCM f/u phone call performed.   Relevant past medical, surgical, family and social history reviewed and updated as indicated. Interim medical history since our last visit reviewed. Allergies and medications reviewed and updated. Outpatient Medications Prior to Visit  Medication Sig Dispense Refill  . calcium carbonate (TUMS - DOSED IN MG ELEMENTAL CALCIUM) 500 MG chewable tablet Chew 1 tablet by mouth 2 (two) times daily with a meal.    . cholecalciferol (VITAMIN D) 1000 UNITS tablet Take 1,000 Units by mouth  daily.    . diphenhydramine-acetaminophen (TYLENOL PM) 25-500 MG TABS Take 2 tablets by mouth at bedtime as needed.    . ferrous sulfate 325 (65 FE) MG tablet Take 1 tablet (325 mg total) by mouth once a week. (Patient taking differently: Take 325 mg by mouth daily with breakfast. )    . prednisoLONE acetate (PRED MILD) 0.12 % ophthalmic suspension Place 1 drop into the left eye daily.     . vitamin B-12 (CYANOCOBALAMIN) 1000 MCG tablet Take 1 tablet (1,000 mcg total) by mouth 2 (two) times a week.    . lidocaine (LIDODERM) 5 % Place 1 patch onto the skin daily. Remove & Discard patch within 12 hours or as directed by MD 30 patch 0  . lisinopril (PRINIVIL,ZESTRIL) 10 MG tablet Take 1 tablet (10 mg total) by mouth daily. (Patient not taking: Reported on 11/02/2017) 30 tablet 1  . omeprazole (PRILOSEC) 40 MG capsule Take 1 capsule (40 mg total) by mouth daily. Take 30 min before a meal (Patient not taking: Reported on 10/11/2017) 30 capsule 1   No facility-administered medications prior to visit.      Per HPI unless specifically indicated in ROS section below Review of Systems     Objective:    BP 124/82 (BP Location: Right Arm, Patient Position: Sitting, Cuff Size: Normal)   Pulse 72   Temp 97.8 F (36.6 C) (Oral)  Ht 5' (1.524 m)   Wt 120 lb 4 oz (54.5 kg)   SpO2 99%   BMI 23.48 kg/m   Wt Readings from Last 3 Encounters:  11/02/17 120 lb 4 oz (54.5 kg)  10/14/17 127 lb 1.6 oz (57.7 kg)  10/10/17 125 lb 12 oz (57 kg)    Physical Exam  Constitutional: She appears well-developed and well-nourished. No distress.  HENT:  Mouth/Throat: Oropharynx is clear and moist. No oropharyngeal exudate.  Cardiovascular: Normal rate and regular rhythm.  Murmur (3/6 systolic) heard. Pulmonary/Chest: Effort normal and breath sounds normal. No respiratory distress. She has no wheezes. She has no rales.  Skin: No rash noted.  Upper left leg abrasion healing well Lower left leg blister slowly  decreasing in size Both covered with bandage today No surrounding erythema  Nursing note and vitals reviewed.  Results for orders placed or performed during the hospital encounter of 10/11/17  CBC with Differential  Result Value Ref Range   WBC 11.3 (H) 3.6 - 11.0 K/uL   RBC 2.16 (L) 3.80 - 5.20 MIL/uL   Hemoglobin 6.8 (L) 12.0 - 16.0 g/dL   HCT 20.7 (L) 35.0 - 47.0 %   MCV 96.0 80.0 - 100.0 fL   MCH 31.7 26.0 - 34.0 pg   MCHC 33.1 32.0 - 36.0 g/dL   RDW 15.7 (H) 11.5 - 14.5 %   Platelets 395 150 - 440 K/uL   Neutrophils Relative % 90 %   Neutro Abs 10.1 (H) 1.4 - 6.5 K/uL   Lymphocytes Relative 3 %   Lymphs Abs 0.4 (L) 1.0 - 3.6 K/uL   Monocytes Relative 7 %   Monocytes Absolute 0.8 0.2 - 0.9 K/uL   Eosinophils Relative 0 %   Eosinophils Absolute 0.0 0 - 0.7 K/uL   Basophils Relative 0 %   Basophils Absolute 0.0 0 - 0.1 K/uL  Basic metabolic panel  Result Value Ref Range   Sodium 127 (L) 135 - 145 mmol/L   Potassium 3.4 (L) 3.5 - 5.1 mmol/L   Chloride 93 (L) 101 - 111 mmol/L   CO2 23 22 - 32 mmol/L   Glucose, Bld 120 (H) 65 - 99 mg/dL   BUN 14 6 - 20 mg/dL   Creatinine, Ser 0.76 0.44 - 1.00 mg/dL   Calcium 9.3 8.9 - 10.3 mg/dL   GFR calc non Af Amer >60 >60 mL/min   GFR calc Af Amer >60 >60 mL/min   Anion gap 11 5 - 15  Vitamin B12  Result Value Ref Range   Vitamin B-12 2,636 (H) 180 - 914 pg/mL  Folate  Result Value Ref Range   Folate 20.0 >5.9 ng/mL  Iron and TIBC  Result Value Ref Range   Iron 11 (L) 28 - 170 ug/dL   TIBC 331 250 - 450 ug/dL   Saturation Ratios 3 (L) 10.4 - 31.8 %   UIBC 320 ug/dL  Ferritin  Result Value Ref Range   Ferritin 49 11 - 307 ng/mL  Reticulocytes  Result Value Ref Range   Retic Ct Pct 4.7 (H) 0.4 - 3.1 %   RBC. 1.96 (L) 3.80 - 5.20 MIL/uL   Retic Count, Absolute 92.1 19.0 - 183.0 K/uL  TSH  Result Value Ref Range   TSH 2.721 0.350 - 4.500 uIU/mL  Basic metabolic panel  Result Value Ref Range   Sodium 130 (L) 135 - 145  mmol/L   Potassium 3.6 3.5 - 5.1 mmol/L   Chloride 100 (L) 101 -  111 mmol/L   CO2 23 22 - 32 mmol/L   Glucose, Bld 98 65 - 99 mg/dL   BUN 12 6 - 20 mg/dL   Creatinine, Ser 0.81 0.44 - 1.00 mg/dL   Calcium 7.8 (L) 8.9 - 10.3 mg/dL   GFR calc non Af Amer >60 >60 mL/min   GFR calc Af Amer >60 >60 mL/min   Anion gap 7 5 - 15  CBC  Result Value Ref Range   WBC 6.1 3.6 - 11.0 K/uL   RBC 2.35 (L) 3.80 - 5.20 MIL/uL   Hemoglobin 7.4 (L) 12.0 - 16.0 g/dL   HCT 21.6 (L) 35.0 - 47.0 %   MCV 91.8 80.0 - 100.0 fL   MCH 31.4 26.0 - 34.0 pg   MCHC 34.2 32.0 - 36.0 g/dL   RDW 17.5 (H) 11.5 - 14.5 %   Platelets 295 150 - 440 K/uL  Prepare RBC  Result Value Ref Range   Order Confirmation      ORDER PROCESSED BY BLOOD BANK Performed at [facility], [address], [place], [place] [address]   ABO/Rh  Result Value Ref Range   ABO/RH(D)      O POS Performed at [facility], [address] [place], [place] [address]   Type and screen Ordered by PROVIDER DEFAULT  Result Value Ref Range   ABO/RH(D) O POS    Antibody Screen NEG    Sample Expiration 10/14/2017    Unit Number [id number]    Blood Component Type RED CELLS,LR    Unit division 00    Status of Unit ISSUED,FINAL    Transfusion Status OK TO TRANSFUSE    Crossmatch Result      Compatible Performed at [facility], [address] [place], [place] [address]   BPAM [facility]  Result Value Ref Range   ISSUE DATE / TIME [id number]    Blood Product Unit Number [id number]    PRODUCT CODE [id number]    Unit Type and Rh 5100    Blood Product Expiration Date [id number]       Assessment & Plan:   Problem List Items Addressed This Visit    Multiple rib fractures    Reviewed pain control regimen - using tylenol. Continue. Will take weeks to heal and for pain to improve.       Macrocytic anemia - Primary    Anemia improved with iron 2018, then acutely worse this past month s/p 1u pRBC blood  transfusion during hospitalization. Iron panel not suggestive of straightforward iron deficiency. ?bone marrow disorder - has hematology appt scheduled for tomorrow.       Fall at home, initial encounter    Recent hospitalization after fall at home, was down for prolonged period. Now using life alert necklace. Unsure details surrounding fall, ?related to lightheadedness from anemia, pt thinks fall was more mechanical after tripping over rug at home - rug has now been removed. Completed rehab PT, was not recommended ongoing outpatient PT. Will check to see if she has HEP at home.       Essential hypertension    Reviewed new BP meds - she has been taking HCTZ at home. Advised stop this and start lisinopril 10mg  daily - to improve hyponatremia.       Relevant Medications   lisinopril (PRINIVIL,ZESTRIL) 10 MG tablet    Other Visit Diagnoses    Hyponatremia           Meds ordered this encounter  Medications  .  lisinopril (PRINIVIL,ZESTRIL) 10 MG tablet    Sig: Take 1 tablet (10 mg total) by mouth daily.    Dispense:  30 tablet    Refill:  3   No orders of the defined types were placed in this encounter.   Follow up plan: Return if symptoms worsen or fail to improve.  [name], MD

## 2017-11-02 NOTE — Patient Instructions (Addendum)
Make sure you're taking lisinopril at home in place of hydrochlorothiazide (new medicine since hospitalization).  We will await blood doctor recommendations.  Good to see you today!

## 2017-11-02 NOTE — Assessment & Plan Note (Signed)
Reviewed pain control regimen - using tylenol. Continue. Will take weeks to heal and for pain to improve.

## 2017-11-02 NOTE — Assessment & Plan Note (Signed)
Anemia improved with iron 2018, then acutely worse this past month s/p 1u pRBC blood transfusion during hospitalization. Iron panel not suggestive of straightforward iron deficiency. ?bone marrow disorder - has hematology appt scheduled for tomorrow.

## 2017-11-02 NOTE — Telephone Encounter (Signed)
I have discussed this with patient previously. DNR form filled and placed in Lisa's box.

## 2017-11-02 NOTE — Telephone Encounter (Signed)
Copied from [place] [phone]. Topic: Inquiry >> Nov 02, 2017  4:14 PM Nils Flack wrote: Reason for CRM: pt is asking for [place] to send her a DNR sheet.  Address on file is correct.

## 2017-11-02 NOTE — Assessment & Plan Note (Signed)
Updated today.

## 2017-11-02 NOTE — Assessment & Plan Note (Signed)
Recent hospitalization after fall at home, was down for prolonged period. Now using life alert necklace. Unsure details surrounding fall, ?related to lightheadedness from anemia, pt thinks fall was more mechanical after tripping over rug at home - rug has now been removed. Completed rehab PT, was not recommended ongoing outpatient PT. Will check to see if she has HEP at home.

## 2017-11-03 ENCOUNTER — Encounter: Payer: Self-pay | Admitting: Hematology and Oncology

## 2017-11-03 ENCOUNTER — Inpatient Hospital Stay: Payer: Medicare Other | Attending: Hematology and Oncology | Admitting: Hematology and Oncology

## 2017-11-03 ENCOUNTER — Other Ambulatory Visit: Payer: Self-pay

## 2017-11-03 ENCOUNTER — Inpatient Hospital Stay: Payer: Medicare Other

## 2017-11-03 VITALS — BP 129/62 | HR 74 | Temp 98.2°F | Resp 18 | Wt 122.1 lb

## 2017-11-03 DIAGNOSIS — D509 Iron deficiency anemia, unspecified: Secondary | ICD-10-CM | POA: Insufficient documentation

## 2017-11-03 DIAGNOSIS — R700 Elevated erythrocyte sedimentation rate: Secondary | ICD-10-CM | POA: Diagnosis not present

## 2017-11-03 DIAGNOSIS — D649 Anemia, unspecified: Secondary | ICD-10-CM

## 2017-11-03 LAB — FERRITIN: Ferritin: 54 ng/mL (ref 11–307)

## 2017-11-03 LAB — CBC WITH DIFFERENTIAL/PLATELET
Basophils Absolute: 0 10*3/uL (ref 0–0.1)
Basophils Relative: 1 %
Eosinophils Absolute: 0.2 10*3/uL (ref 0–0.7)
Eosinophils Relative: 3 %
HCT: 29.9 % — ABNORMAL LOW (ref 35.0–47.0)
Hemoglobin: 10.2 g/dL — ABNORMAL LOW (ref 12.0–16.0)
Lymphocytes Relative: 16 %
Lymphs Abs: 0.9 10*3/uL — ABNORMAL LOW (ref 1.0–3.6)
MCH: 30.8 pg (ref 26.0–34.0)
MCHC: 34 g/dL (ref 32.0–36.0)
MCV: 90.6 fL (ref 80.0–100.0)
Monocytes Absolute: 0.6 10*3/uL (ref 0.2–0.9)
Monocytes Relative: 10 %
Neutro Abs: 4.1 10*3/uL (ref 1.4–6.5)
Neutrophils Relative %: 70 %
Platelets: 407 10*3/uL (ref 150–440)
RBC: 3.3 MIL/uL — ABNORMAL LOW (ref 3.80–5.20)
RDW: 16.5 % — ABNORMAL HIGH (ref 11.5–14.5)
WBC: 5.8 10*3/uL (ref 3.6–11.0)

## 2017-11-03 LAB — URINALYSIS, COMPLETE (UACMP) WITH MICROSCOPIC
Bacteria, UA: NONE SEEN
Bilirubin Urine: NEGATIVE
Glucose, UA: NEGATIVE mg/dL
Hgb urine dipstick: NEGATIVE
Ketones, ur: NEGATIVE mg/dL
Nitrite: NEGATIVE
Protein, ur: NEGATIVE mg/dL
Specific Gravity, Urine: 1.031 — ABNORMAL HIGH (ref 1.005–1.030)
pH: 5 (ref 5.0–8.0)

## 2017-11-03 LAB — IRON AND TIBC
Iron: 22 ug/dL — ABNORMAL LOW (ref 28–170)
Saturation Ratios: 7 % — ABNORMAL LOW (ref 10.4–31.8)
TIBC: 317 ug/dL (ref 250–450)
UIBC: 295 ug/dL

## 2017-11-03 LAB — SEDIMENTATION RATE: Sed Rate: 45 mm/hr — ABNORMAL HIGH (ref 0–30)

## 2017-11-03 LAB — DAT, POLYSPECIFIC AHG (ARMC ONLY): Polyspecific AHG test: NEGATIVE

## 2017-11-03 LAB — SAMPLE TO BLOOD BANK

## 2017-11-03 LAB — TECHNOLOGIST SMEAR REVIEW

## 2017-11-03 NOTE — Progress Notes (Signed)
New patient, referred by Dr Silvio Pate for anemia, fell a month ago and was in rehab facility.  Back at [facility] [place] currently.

## 2017-11-03 NOTE — Telephone Encounter (Signed)
Spoke with pt notifying her I am mailing the DNR.  Mailed DNR.

## 2017-11-03 NOTE — Progress Notes (Signed)
[facility] day:  11/03/2017  Chief Complaint: Teresa Meyer is a 84 y.o. female with severe anemia who is referred in consultation by Dr Silvio Pate for assessment and management.  HPI:  The patient states that she has been anemic "most of my life".  She has been on and off oral iron "all of my life".  She was admitted to [facility] from 10/11/2017 - 10/14/2017 following a fall.  She was noted to have hyponatremia (129) and anemia (hemoglobin 6.6).  She was diagnosed with rib fractures (left 6,7,9,10).  She received 1 unit of PRBCs.  She did not want aggressive management (GI consult).  She was started on a PPI and oral iron.  She declined IV iron.  With hydration, sodium improved to 130.  Work-up included the following normal studies: B12 (2536), folate, TSH.  Ferritin was 23.3 - 49.  Iron saturation was 3% with a TIBC of 331.  Retic was 4.7%.  Creatinine was 0.89.  CBCs: 02/08/2017:  Hematocrit 37.7, hemoglobin 12.5, MCV 103.2, platelets 304,000, WBC 6700. 10/10/2017:  Hematocrit 19.3, hemoglobin 6.8, MCV 95.7, platelets 430,000, WBC 7300. 10/12/2017:  Hematocrit 21.6, hemoglobin 7.4, MCV 91.8, platelets 295,000, WBC 6100.  She underwent EGD and balloon dilation on 02/06/2009 by Dr. Lanae Boast.  She had an esophageal stricture at the GE junction and duodenitis.  Colonoscopy on 08/04/2001 by Dr. Erskine Emery revealed sigmoid diverticulosis.  Symptomatically, patient notes that she feels "ok". She has no real complaints today. Patient denies bleeding; no hematochezia, melena, or gross hematuria. Patient eats "whatever I can eat. I eat a regular meal". Patient describes an adequate diet that is rich in iron. She eats meat and green leafy vegetables 5-6 days a week. Patient take oral iron supplement once a week, and an oral B12 supplement twice a week. She denies ice pica. She does not experience restless leg symptoms.   Patient requires some assistance with  her ADLs. She has home health caregivers coming in daily to help. Patient complains of chronic pain in her knees. Patient is hard of hearing. She has generalized weakness and requires a cane for ambulation. Patient sustained a fall x 1 month ago, causing her to have to go into rehab facility. She is now back home in her [place] at [facility].   Patient denies pain in the clinic today.    Past Medical History:  Diagnosis Date  . Arthritis   . Atypical nevi 09/2014   lentiginous dysplastic nevus with mod atypia ([place])  . Diverticulitis   . DNR (do not resuscitate)   . Esophageal stricture 2012  . GERD (gastroesophageal reflux disease)   . HLD (hyperlipidemia)    did not tolerate statin, stopped checking  . HTN (hypertension)   . Malignant melanoma ([place]) 2015, 2017   R chin s/p excision ([place]) R upper back [name])  . Osteoporosis, post-menopausal    bisphosphonate caused dysphagia, requests defer DEXA for now  . Seasonal allergies   . Wears hearing aid     Past Surgical History:  Procedure Laterality Date  . BUNIONECTOMY Right 2001  . CARPAL TUNNEL RELEASE Bilateral 1974  . CATARACT EXTRACTION Bilateral    [place]  . TONSILLECTOMY  1928    Family History  Problem Relation Age of Onset  . Pancreatic cancer Son   . Breast cancer Sister   . Lung cancer Mother   . Diabetes Other        nephew  .  Stroke Neg Hx   . CAD Neg Hx     Social History:  reports that she has never smoked. She has never used smokeless tobacco. She reports that she drinks alcohol. She reports that she does not use drugs.  She worked for a travel agency for 25 years.  She lives at [facility].  The patient is accompanied by nephew, [name], and his wife today.  Allergies:  Allergies  Allergen Reactions  . Bisphosphonates Other (See Comments)    Dysphagia to oral bisphosphonates  . Codeine   . Statins Other (See Comments)    Muscle aches  . Sulfa Antibiotics     Can't recall  reaction  . Procaine Hcl Palpitations    Current Medications: Current Outpatient Medications  Medication Sig Dispense Refill  . calcium carbonate (TUMS - DOSED IN MG ELEMENTAL CALCIUM) 500 MG chewable tablet Chew 1 tablet by mouth 2 (two) times daily with a meal.    . cholecalciferol (VITAMIN D) 1000 UNITS tablet Take 1,000 Units by mouth daily.    . diphenhydramine-acetaminophen (TYLENOL PM) 25-500 MG TABS Take 2 tablets by mouth at bedtime as needed.    . ferrous sulfate 325 (65 FE) MG tablet Take 1 tablet (325 mg total) by mouth once a week. (Patient taking differently: Take 325 mg by mouth daily with breakfast. )    . lisinopril (PRINIVIL,ZESTRIL) 10 MG tablet Take 1 tablet (10 mg total) by mouth daily. 30 tablet 3  . prednisoLONE acetate (PRED MILD) 0.12 % ophthalmic suspension Place 1 drop into the left eye daily.     . vitamin B-12 (CYANOCOBALAMIN) 1000 MCG tablet Take 1 tablet (1,000 mcg total) by mouth 2 (two) times a week.     No current facility-administered medications for this visit.     Review of Systems  Constitutional: Negative for diaphoresis, fever, malaise/fatigue and weight loss.  HENT: Positive for hearing loss (hearing aids). Negative for nosebleeds and sore throat.   Eyes: Negative for pain and redness.       Worn glasses since age 24. Previous cataract surgery on LEFT eye. States, "He wont touch my right eye".   Respiratory: Negative for cough, hemoptysis, sputum production and shortness of breath.   Cardiovascular: Negative for chest pain, palpitations, orthopnea, leg swelling and PND.  Gastrointestinal: Negative for abdominal pain, blood in stool, constipation, diarrhea, melena, nausea and vomiting.  Genitourinary: Negative for dysuria, frequency, hematuria and urgency.  Musculoskeletal: Positive for falls and joint pain (bilateral knees). Negative for back pain and myalgias.  Skin: Negative for itching and rash.       Chronic skin changes to BLE.  Neurological:  Positive for weakness (generalized - walks with a cane). Negative for dizziness, tremors and headaches.  Endo/Heme/Allergies: Does not bruise/bleed easily.  Psychiatric/Behavioral: Negative for depression, memory loss and suicidal ideas. The patient is not nervous/anxious and does not have insomnia.   All other systems reviewed and are negative.  Physical Exam: Blood pressure 129/62, pulse 74, temperature 98.2 F (36.8 C), temperature source Tympanic, resp. rate 18, weight 122 lb 1 oz (55.4 kg). GENERAL:  Elderly woman sitting comfortably in a wheelchair in the exam room in no acute distress.  She has a cane. MENTAL STATUS:  Alert and oriented to person, place and time. HEAD:  Short white hair.  Normocephalic, atraumatic, face symmetric, no Cushingoid features. EYES:  Blue eyes.  Left pupil irregular.  Pupils reactive to light and accomodation.  No conjunctivitis or scleral icterus.  ENT:  Oropharynx clear without lesion.  Tongue normal. Mucous membranes moist.  RESPIRATORY:  Clear to auscultation without rales, wheezes or rhonchi. CARDIOVASCULAR:  Regular rate and rhythm without murmur, rub or gallop. ABDOMEN:  Soft, non-tender, with active bowel sounds, and no hepatosplenomegaly.  No masses. SKIN:  s/p biopsy right nose.  Skin changes back and lower extremity.  No rashes. EXTREMITIES: No edema, no skin discoloration or tenderness.  No palpable cords. LYMPH NODES: No palpable cervical, supraclavicular, axillary or inguinal adenopathy  NEUROLOGICAL: Unremarkable. PSYCH:  Appropriate.   No visits with results within 3 Day(s) from this visit.  Latest known visit with results is:  Admission on 10/11/2017, Discharged on 10/14/2017  Component Date Value Ref Range Status  . WBC 10/11/2017 11.3* 3.6 - 11.0 K/uL Final  . RBC 10/11/2017 2.16* 3.80 - 5.20 MIL/uL Final  . Hemoglobin 10/11/2017 6.8* 12.0 - 16.0 g/dL Final  . HCT 10/11/2017 20.7* 35.0 - 47.0 % Final  . MCV 10/11/2017 96.0  80.0 -  100.0 fL Final  . MCH 10/11/2017 31.7  26.0 - 34.0 pg Final  . MCHC 10/11/2017 33.1  32.0 - 36.0 g/dL Final  . RDW 10/11/2017 15.7* 11.5 - 14.5 % Final  . Platelets 10/11/2017 395  150 - 440 K/uL Final  . Neutrophils Relative % 10/11/2017 90  % Final  . Neutro Abs 10/11/2017 10.1* 1.4 - 6.5 K/uL Final  . Lymphocytes Relative 10/11/2017 3  % Final  . Lymphs Abs 10/11/2017 0.4* 1.0 - 3.6 K/uL Final  . Monocytes Relative 10/11/2017 7  % Final  . Monocytes Absolute 10/11/2017 0.8  0.2 - 0.9 K/uL Final  . Eosinophils Relative 10/11/2017 0  % Final  . Eosinophils Absolute 10/11/2017 0.0  0 - 0.7 K/uL Final  . Basophils Relative 10/11/2017 0  % Final  . Basophils Absolute 10/11/2017 0.0  0 - 0.1 K/uL Final   Performed at [facility], [address] [place], [place] [address]  . Sodium 10/11/2017 127* 135 - 145 mmol/L Final  . Potassium 10/11/2017 3.4* 3.5 - 5.1 mmol/L Final  . Chloride 10/11/2017 93* 101 - 111 mmol/L Final  . CO2 10/11/2017 23  22 - 32 mmol/L Final  . Glucose, Bld 10/11/2017 120* 65 - 99 mg/dL Final  . BUN 10/11/2017 14  6 - 20 mg/dL Final  . Creatinine, Ser 10/11/2017 0.76  0.44 - 1.00 mg/dL Final  . Calcium 10/11/2017 9.3  8.9 - 10.3 mg/dL Final  . GFR calc non Af Amer 10/11/2017 >60  >60 mL/min Final  . GFR calc Af Amer 10/11/2017 >60  >60 mL/min Final   Comment: (NOTE) The eGFR has been calculated using the CKD EPI equation. This calculation has not been validated in all clinical situations. eGFR's persistently <60 mL/min signify possible Chronic Kidney Disease.   Georgiann Hahn gap 10/11/2017 11  5 - 15 Final   Performed at [facility], [address] [place], [place] [address]  . Order Confirmation 10/11/2017    Final                   Value:ORDER PROCESSED BY BLOOD BANK Performed at [facility], [address] [place], [place] [address]   . ABO/RH(D) 10/11/2017    Final                   Value:O POS Performed at [facility], [address], [place], [place] [address]   . ABO/RH(D) 10/11/2017 Jenetta Downer  POS   Final  . Antibody Screen 10/11/2017 NEG   Final  . Sample Expiration 10/11/2017 10/14/2017   Final  . Unit Number 10/11/2017 [id number]   Final  . Blood Component Type 10/11/2017 RED CELLS,LR   Final  . Unit division 10/11/2017 00   Final  . Status of Unit 10/11/2017 ISSUED,FINAL   Final  . Transfusion Status 10/11/2017 OK TO TRANSFUSE   Final  . Crossmatch Result 10/11/2017    Final                   Value:Compatible Performed at [facility], [address], [place], [place] [address]   . Vitamin B-12 10/11/2017 2,636* 180 - 914 pg/mL Final   Comment: (NOTE) This assay is not validated for testing neonatal or myeloproliferative syndrome specimens for Vitamin B12 levels. Performed at [facility], [place] [address] [place], [place] [address]   . Folate 10/11/2017 20.0  >5.9 ng/mL Final   Performed at [facility], [address] [place], [place] [address]  . Iron 10/11/2017 11* 28 - 170 ug/dL Final  . TIBC 10/11/2017 331  250 - 450 ug/dL Final  . Saturation Ratios 10/11/2017 3* 10.4 - 31.8 % Final  . UIBC 10/11/2017 320  ug/dL Final   Performed at [facility], [address] [place], [place] [address]  . Ferritin 10/11/2017 49  11 - 307 ng/mL Final   Performed at [facility], [address] [place], [place] [address]  . Retic Ct Pct 10/11/2017 4.7* 0.4 - 3.1 % Final  . RBC. 10/11/2017 1.96* 3.80 - 5.20 MIL/uL Final  . Retic Count, Absolute 10/11/2017 92.1  19.0 - 183.0 K/uL Final   Performed at [facility], [address] [place], [place] [address]  . TSH 10/11/2017 2.721  0.350 - 4.500 uIU/mL Final   Comment: Performed by a 3rd Generation assay with a functional sensitivity of <=0.01 uIU/mL. Performed at [facility], [address] [place],  [address]   . ISSUE DATE / TIME 10/11/2017 [id number]   Final  . Blood  Product Unit Number 10/11/2017 [id number]   Final  . PRODUCT CODE 10/11/2017 [id number]   Final  . Unit Type and Rh 10/11/2017 5100   Final  . Blood Product Expiration Date 10/11/2017 [id number]   Final  . Sodium 10/12/2017 130* 135 - 145 mmol/L Final  . Potassium 10/12/2017 3.6  3.5 - 5.1 mmol/L Final  . Chloride 10/12/2017 100* 101 - 111 mmol/L Final  . CO2 10/12/2017 23  22 - 32 mmol/L Final  . Glucose, Bld 10/12/2017 98  65 - 99 mg/dL Final  . BUN 10/12/2017 12  6 - 20 mg/dL Final  . Creatinine, Ser 10/12/2017 0.81  0.44 - 1.00 mg/dL Final  . Calcium 10/12/2017 7.8* 8.9 - 10.3 mg/dL Final  . GFR calc non Af Amer 10/12/2017 >60  >60 mL/min Final  . GFR calc Af Amer 10/12/2017 >60  >60 mL/min Final   Comment: (NOTE) The eGFR has been calculated using the CKD EPI equation. This calculation has not been validated in all clinical situations. eGFR's persistently <60 mL/min signify possible Chronic Kidney Disease.   Georgiann Hahn gap 10/12/2017 7  5 - 15 Final   Performed at [facility], [address] [place],  [address]  . WBC 10/12/2017 6.1  3.6 - 11.0 K/uL Final  . RBC 10/12/2017 2.35* 3.80 - 5.20 MIL/uL Final  . Hemoglobin 10/12/2017 7.4*  12.0 - 16.0 g/dL Final  . HCT 10/12/2017 21.6* 35.0 - 47.0 % Final  . MCV 10/12/2017 91.8  80.0 - 100.0 fL Final  . MCH 10/12/2017 31.4  26.0 - 34.0 pg Final  . MCHC 10/12/2017 34.2  32.0 - 36.0 g/dL Final  . RDW 10/12/2017 17.5* 11.5 - 14.5 % Final  . Platelets 10/12/2017 295  150 - 440 K/uL Final   Performed at [facility], [address], [place],  [address]    Assessment:  Teresa Meyer is a 84 y.o. female with a normocytic anemia.  She has a history of chronic anemia and has been on/off oral iron.  Diet appears good.  She denies any bleeding. She is taking oral iron once/week.  Work-up in 09/2017 included the following normal studies: B12, folate, TSH.  Ferritin was 23.3 - 49.  Iron saturation was 3% with  a TIBC of 331.  Retic was 4.7%.  Creatinine was 0.89.  EGD on 02/06/2009 revealed an esophageal stricture at the GE junction and duodenitis.  She underwent dilatation.  Colonoscopy on 08/04/2001 revealed sigmoid diverticulosis.  She was admitted to [facility] from 10/11/2017 - 10/14/2017 following a fall with symptomatic hyponatremia (129) and anemia (hemoglobin 6.6).  She was diagnosed with rib fractures (left 6,7,9,10).  She received 1 unit of PRBCs.  She declined GI consult.  She was started on a PPI and oral iron.   Symptomatically, she feels "fine".  Exam reveals no adenopathy or hepatosplenomegaly.  Hemoglobin has improved from 7.4 to 10.2.  Plan: 1. Discuss anemia. Suspect iron deficiency in addition to a possible underlying myelodysplastic disorder. Discuss repleting iron stores then re-evaluation.  Patient is taking oral iron once a week. Advised to start taking oral iron, with a source of vitamin C, daily.  2. Labs today:  CBC with diff, ferritin, iron studies, sed rate, DAT, SPEP, FLCA, hold tube, UA. 3. Peripheral smear for review. 4. RTC in 1 week for MD assessment, review of workup and discussion regarding direction of therapy.    Honor Loh, NP  11/03/2017, 2:43 PM   I saw and evaluated the patient, participating in the key portions of the service and reviewing pertinent diagnostic studies and records.  I reviewed the nurse practitioner's note and agree with the findings and the plan.  The assessment and plan were discussed with the patient.  Several questions were asked by the patient and answered.   Nolon Stalls, MD 11/03/2017,2:43 PM

## 2017-11-04 LAB — KAPPA/LAMBDA LIGHT CHAINS
Kappa free light chain: 28.3 mg/L — ABNORMAL HIGH (ref 3.3–19.4)
Kappa, lambda light chain ratio: 1.23 (ref 0.26–1.65)
Lambda free light chains: 23 mg/L (ref 5.7–26.3)

## 2017-11-04 LAB — PROTEIN ELECTROPHORESIS, SERUM
A/G Ratio: 1.3 (ref 0.7–1.7)
Albumin ELP: 3.8 g/dL (ref 2.9–4.4)
Alpha-1-Globulin: 0.3 g/dL (ref 0.0–0.4)
Alpha-2-Globulin: 0.8 g/dL (ref 0.4–1.0)
Beta Globulin: 1 g/dL (ref 0.7–1.3)
Gamma Globulin: 0.9 g/dL (ref 0.4–1.8)
Globulin, Total: 3 g/dL (ref 2.2–3.9)
Total Protein ELP: 6.8 g/dL (ref 6.0–8.5)

## 2017-11-10 ENCOUNTER — Telehealth: Payer: Self-pay | Admitting: Family Medicine

## 2017-11-10 NOTE — Telephone Encounter (Signed)
Copied from [place] [id number]. Topic: General - Other >> Nov 10, 2017 11:02 AM Carolyn Stare wrote:  [name] a nurse at [facility] would like call back concerning a wound on pt left leg   [phone]  or [phone]

## 2017-11-10 NOTE — Telephone Encounter (Signed)
I spoke with Teresa Meyer at [facility]; pt has a wound on lt leg; pt was seen 11/02/17 with large abrasion lt leg. Pt is back home at [facility] independent living. [name] saw pt this morning; pt is supposed to wear TED hose but understands cant wear hose until abrasion heals. Teresa Meyer is concerned that it appears to have necrotic tissue around wound with white pus like area; no drainage and not warm to touch and pt states is not painful.No fever. There is redness around the wound. [name] scheduled appt for pt on 11/11/17 11:15 to see [name]; pt will have aide with her. Teresa Meyer hopes for some orders about wound care. Presently pt is changing her own dressing. FYI to [name].

## 2017-11-10 NOTE — Telephone Encounter (Signed)
Thank you. Will see tomorrow.  

## 2017-11-10 NOTE — Telephone Encounter (Signed)
Copied from [place]. Topic: General - Other >> Nov 10, 2017 11:06 AM Carolyn Stare wrote:  [name] a nurse with [facility] call to ask for a call back concerning a would on pt left leg  [phone] or [phone]

## 2017-11-11 ENCOUNTER — Inpatient Hospital Stay (HOSPITAL_BASED_OUTPATIENT_CLINIC_OR_DEPARTMENT_OTHER): Payer: Medicare Other | Admitting: Hematology and Oncology

## 2017-11-11 ENCOUNTER — Ambulatory Visit: Payer: Self-pay | Admitting: Family Medicine

## 2017-11-11 ENCOUNTER — Encounter: Payer: Self-pay | Admitting: Hematology and Oncology

## 2017-11-11 ENCOUNTER — Encounter: Payer: Self-pay | Admitting: Family Medicine

## 2017-11-11 ENCOUNTER — Ambulatory Visit (INDEPENDENT_AMBULATORY_CARE_PROVIDER_SITE_OTHER): Payer: Medicare Other | Admitting: Family Medicine

## 2017-11-11 VITALS — BP 132/80 | HR 73 | Temp 98.2°F | Ht 60.0 in

## 2017-11-11 VITALS — BP 143/74 | HR 70 | Temp 97.6°F | Resp 18 | Wt 119.0 lb

## 2017-11-11 DIAGNOSIS — S81802D Unspecified open wound, left lower leg, subsequent encounter: Secondary | ICD-10-CM | POA: Diagnosis not present

## 2017-11-11 DIAGNOSIS — R600 Localized edema: Secondary | ICD-10-CM

## 2017-11-11 DIAGNOSIS — I872 Venous insufficiency (chronic) (peripheral): Secondary | ICD-10-CM

## 2017-11-11 DIAGNOSIS — R700 Elevated erythrocyte sedimentation rate: Secondary | ICD-10-CM

## 2017-11-11 DIAGNOSIS — D509 Iron deficiency anemia, unspecified: Secondary | ICD-10-CM

## 2017-11-11 NOTE — Progress Notes (Addendum)
BP 132/80 (BP Location: Right Arm, Patient Position: Sitting, Cuff Size: Normal)   Pulse 73   Temp 98.2 F (36.8 C) (Oral)   Ht 5' (1.524 m)   SpO2 100%   BMI 23.24 kg/m    CC: check leg wound Subjective:    Patient ID: Teresa Meyer, female    DOB: [birth year], 84 y.o.   MRN: [id number]  HPI: Teresa Meyer is a 84 y.o. female presenting on 11/11/2017 for Wound (Located on lower left LE. Nurse at [facility] suggested pt have wound checked by PCP to see if they need to change care. Pt has been applying Mupirocin 2% oint.  Pt is accompanied by her nephew. )   Here with nephew Teresa Meyer. Nurse at [facility] [place] wanted evaluation by PCP for poorly healing wound LLE.   Ongoing wound L lower leg that started as blood blister after fall at home, present for 4 wks and poorly healing. Has been treating with mupirocin ointment. Tender woun but no surrounding or streaking redness.   Heme eval for progressive anemia - thought IDA, considering IV iron if no response to oral iron daily.   Relevant past medical, surgical, family and social history reviewed and updated as indicated. Interim medical history since our last visit reviewed. Allergies and medications reviewed and updated. Outpatient Medications Prior to Visit  Medication Sig Dispense Refill  . calcium carbonate (TUMS - DOSED IN MG ELEMENTAL CALCIUM) 500 MG chewable tablet Chew 1 tablet by mouth 2 (two) times daily with a meal.    . cholecalciferol (VITAMIN D) 1000 UNITS tablet Take 1,000 Units by mouth daily.    . diphenhydramine-acetaminophen (TYLENOL PM) 25-500 MG TABS Take 2 tablets by mouth at bedtime as needed.    . ferrous sulfate 325 (65 FE) MG tablet Take 1 tablet (325 mg total) by mouth once a week. (Patient taking differently: Take 325 mg by mouth daily with breakfast. )    . lisinopril (PRINIVIL,ZESTRIL) 10 MG tablet Take 1 tablet (10 mg total) by mouth daily. 30 tablet 3  . prednisoLONE acetate (PRED MILD) 0.12 % ophthalmic suspension  Place 1 drop into the left eye daily.     . vitamin B-12 (CYANOCOBALAMIN) 1000 MCG tablet Take 1 tablet (1,000 mcg total) by mouth 2 (two) times a week.     No facility-administered medications prior to visit.      Per HPI unless specifically indicated in ROS section below Review of Systems     Objective:    BP 132/80 (BP Location: Right Arm, Patient Position: Sitting, Cuff Size: Normal)   Pulse 73   Temp 98.2 F (36.8 C) (Oral)   Ht 5' (1.524 m)   SpO2 100%   BMI 23.24 kg/m   Wt Readings from Last 3 Encounters:  11/11/17 119 lb (54 kg)  11/03/17 122 lb 1 oz (55.4 kg)  11/02/17 120 lb 4 oz (54.5 kg)    Physical Exam  Constitutional: She appears well-developed and well-nourished. No distress.  Musculoskeletal: She exhibits edema (1+ tender chronic edema bilaterally).  Skin: Skin is warm. No erythema.     Half circle shaped chronic blood blister anterior left mid shin filled with fibrin, scabbing, small amt exudate, without surrounding induration or erythema, not draining  Nursing note and vitals reviewed.     Assessment & Plan:   Problem List Items Addressed This Visit    Pedal edema   Leg wound, left, subsequent encounter - Primary    Poorly healing  wound present for 4 weeks now. Half of initial blood blister has healed well, other half remains with scab/fibrotic tissue leading to poor, delayed healing. Anticipate will need debridement - so will refer to wound care clinic. No signs of infection at this time, ok to hold antibiotic ointment for now. Keep bandaid on for protection.       Relevant Orders   Ambulatory referral to Wound Clinic   Chronic venous insufficiency       No orders of the defined types were placed in this encounter.  Orders Placed This Encounter  Procedures  . Ambulatory referral to Wound Clinic    Referral Priority:   Routine    Referral Type:   Consultation    Referral Reason:   Specialty Services Required    Requested Specialty:   Wound  Care    Number of Visits Requested:   1    Follow up plan: Return if symptoms worsen or fail to improve.  [name], MD

## 2017-11-11 NOTE — Progress Notes (Signed)
Patient here today for workup from last week's visit.

## 2017-11-11 NOTE — Progress Notes (Signed)
[facility] day:  11/11/2017  Chief Complaint: Teresa Meyer is a 84 y.o. female with severe anemia who is seen for review of work-up and discussion regarding direction of therapy.  HPI:  The patient was last seen in the hematology clinic on 11/03/2017 for initial consultation.  She was felt to have iron deficiency.  An underlying myelodysplasia was also considered.  She underwent a work-up.  CBC revealed a hematocrit 29.9, hemoglobin 10.2, MCV 90.6, platelets 407,000, white count 5800 with an ANC of 4100.  Coombs was negative.  SPEP revealed no monoclonal protein.  Kappa free light chains were 28.3, lambda free light chains 23.0 and ratio 1.23 (normal).  Ferritin was 54.  Iron saturation was 7% with a TIBC of 317.  Sed rate was 45 (0-30).  Urinalysis revealed leukocytes, but no blood.  Peripheral smear revealed platelets variable in size with large body platelets.  Red cell morphology and white cell morphology were unremarkable.  Symptomatically, patient is doing well. Patient has a non-healing wound to her lower leg. She is seeing her PCP later today for further evaluation. Patient denies that she has experienced any B symptoms. She denies any interval infections.   She maintains an adequate appetite, and notes that she is eating well. Weight, compared to her last visit to the clinic, has decreased by 3 pounds. Patient continues on oral iron supplement daily. She denies pain in the clinic today.   Past Medical History:  Diagnosis Date  . Arthritis   . Atypical nevi 09/2014   lentiginous dysplastic nevus with mod atypia ([place])  . Diverticulitis   . DNR (do not resuscitate)   . Esophageal stricture 2012  . GERD (gastroesophageal reflux disease)   . HLD (hyperlipidemia)    did not tolerate statin, stopped checking  . HTN (hypertension)   . Malignant melanoma ([place]) 2015, 2017   R chin s/p excision ([place]) R upper back [name])  .  Osteoporosis, post-menopausal    bisphosphonate caused dysphagia, requests defer DEXA for now  . Seasonal allergies   . Wears hearing aid     Past Surgical History:  Procedure Laterality Date  . BUNIONECTOMY Right 2001  . CARPAL TUNNEL RELEASE Bilateral 1974  . CATARACT EXTRACTION Bilateral    [place]  . TONSILLECTOMY  1928    Family History  Problem Relation Age of Onset  . Pancreatic cancer Son   . Breast cancer Sister   . Lung cancer Mother   . Diabetes Other        nephew  . Stroke Neg Hx   . CAD Neg Hx     Social History:  reports that she has never smoked. She has never used smokeless tobacco. She reports that she drinks alcohol. She reports that she does not use drugs.  She worked at a travel agency for 25 years.  She lives at [facility].  The patient is accompanied by a her nephew, [name], and his wife today.  Allergies:  Allergies  Allergen Reactions  . Bisphosphonates Other (See Comments)    Dysphagia to oral bisphosphonates  . Codeine   . Statins Other (See Comments)    Muscle aches  . Sulfa Antibiotics     Can't recall reaction  . Procaine Hcl Palpitations    Current Medications: Current Outpatient Medications  Medication Sig Dispense Refill  . calcium carbonate (TUMS - DOSED IN MG ELEMENTAL CALCIUM) 500 MG chewable tablet Chew 1 tablet by  mouth 2 (two) times daily with a meal.    . cholecalciferol (VITAMIN D) 1000 UNITS tablet Take 1,000 Units by mouth daily.    . diphenhydramine-acetaminophen (TYLENOL PM) 25-500 MG TABS Take 2 tablets by mouth at bedtime as needed.    . ferrous sulfate 325 (65 FE) MG tablet Take 1 tablet (325 mg total) by mouth once a week. (Patient taking differently: Take 325 mg by mouth daily with breakfast. )    . lisinopril (PRINIVIL,ZESTRIL) 10 MG tablet Take 1 tablet (10 mg total) by mouth daily. 30 tablet 3  . prednisoLONE acetate (PRED MILD) 0.12 % ophthalmic suspension Place 1 drop into the left eye daily.      . vitamin B-12 (CYANOCOBALAMIN) 1000 MCG tablet Take 1 tablet (1,000 mcg total) by mouth 2 (two) times a week.     No current facility-administered medications for this visit.     Review of Systems  Constitutional: Positive for weight loss (down 3 pounds). Negative for diaphoresis, fever and malaise/fatigue.  HENT: Positive for hearing loss (hearing aids). Negative for nosebleeds and sore throat.   Eyes: Negative for pain and redness.       Worn glasses since age 61. Previous cataract surgery on LEFT eye. States, "He wont touch my right eye".   Respiratory: Negative for cough, hemoptysis, sputum production and shortness of breath.   Cardiovascular: Negative for chest pain, palpitations, orthopnea, leg swelling and PND.  Gastrointestinal: Positive for heartburn (uses calcium carbonate tabs). Negative for abdominal pain, blood in stool, constipation, diarrhea, melena, nausea and vomiting.  Genitourinary: Negative for dysuria, frequency, hematuria and urgency.  Musculoskeletal: Positive for falls and joint pain (bilateral knees). Negative for back pain and myalgias.  Skin: Negative for itching and rash.       Chronic skin changes to BLE. Slow healing wound to LEFT lower leg.   Neurological: Positive for weakness (generalized - walks with a cane). Negative for dizziness, tremors and headaches.  Endo/Heme/Allergies: Does not bruise/bleed easily.  Psychiatric/Behavioral: Negative for depression, memory loss and suicidal ideas. The patient is not nervous/anxious and does not have insomnia.   All other systems reviewed and are negative.  Performance status (ECOG): 2 - Symptomatic, <50% confined to bed  Physical Exam: Blood pressure (!) 143/74, pulse 70, temperature 97.6 F (36.4 C), temperature source Tympanic, resp. rate 18, weight 119 lb (54 kg), SpO2 96 %. GENERAL:  Elderly woman sitting comfortably in a wheelchair in the exam room in no acute distress. MENTAL STATUS:  Alert and oriented to  person, place and time. HEAD:  Short hair.  Normocephalic, atraumatic, face symmetric, no Cushingoid features. EYES:  Blue eyes.  No conjunctivitis or scleral icterus. NEUROLOGICAL: Unremarkable. PSYCH:  Appropriate.    No visits with results within 3 Day(s) from this visit.  Latest known visit with results is:  Appointment on 11/03/2017  Component Date Value Ref Range Status  . Kappa free light chain 11/03/2017 28.3* 3.3 - 19.4 mg/L Final  . Lamda free light chains 11/03/2017 23.0  5.7 - 26.3 mg/L Final  . Kappa, lamda light chain ratio 11/03/2017 1.23  0.26 - 1.65 Final   Comment: (NOTE) Performed At: [facility] [place], [place] [id number] Rush Farmer MD QJ:[id number] Performed at [facility], [address], [place], [place] [address]   . Tech Review 11/03/2017 PLATELETS VARY IN SIZE, LARGE BODY PLATELETS NOTED ON SMEAR   Final   Comment: RBC MORPHOLOGY UNREMARKABLE WBC MORPHOLOGY UNREMARKABLE Performed at [facility], [address], [place], [place] [address]   . Blood Bank Specimen 11/03/2017 SAMPLE AVAILABLE FOR TESTING   Final  . Sample Expiration 11/03/2017    Final                   Value:11/06/2017 Performed at [facility], [address], [place], [place] [address]   . Total Protein ELP 11/03/2017 6.8  6.0 - 8.5 g/dL Final  . Albumin ELP 11/03/2017 3.8  2.9 - 4.4 g/dL Final  . Alpha-1-Globulin 11/03/2017 0.3  0.0 - 0.4 g/dL Final  . Alpha-2-Globulin 11/03/2017 0.8  0.4 - 1.0 g/dL Final  . Beta Globulin 11/03/2017 1.0  0.7 - 1.3 g/dL Final  . Gamma Globulin 11/03/2017 0.9  0.4 - 1.8 g/dL Final  . M-Spike, % 11/03/2017 Not Observed  Not Observed g/dL Final  . SPE Interp. 11/03/2017 Comment   Final   Comment: (NOTE) The SPE pattern appears essentially unremarkable. Evidence of monoclonal protein is not apparent. Performed At: [facility] [place], [place] [id number] Rush Farmer MD  XB:[id number]   . Comment 11/03/2017 Comment   Final   Comment: (NOTE) Protein electrophoresis scan will follow via computer, mail, or courier delivery.   Marland Kitchen GLOBULIN, TOTAL 11/03/2017 3.0  2.2 - 3.9 g/dL Corrected  . A/G Ratio 11/03/2017 1.3  0.7 - 1.7 Corrected   Performed at [facility], [address], [place],  [place] [address]  . Polyspecific AHG test 11/03/2017    Final                   Value:NEG Performed at [facility], [address] [place], [place] [address]   . Sed Rate 11/03/2017 45* 0 - 30 mm/hr Final   Performed at [facility], [address] [place], [place] [address]  . Iron 11/03/2017 22* 28 - 170 ug/dL Final  . TIBC 11/03/2017 317  250 - 450 ug/dL Final  . Saturation Ratios 11/03/2017 7* 10.4 - 31.8 % Final  . UIBC 11/03/2017 295  ug/dL Final   Performed at [facility], [address] [place], [place] [address]  . Ferritin 11/03/2017 54  11 - 307 ng/mL Final   Performed at [facility], [address] [place], [place] [address]  . WBC 11/03/2017 5.8  3.6 - 11.0 K/uL Final  . RBC 11/03/2017 3.30* 3.80 - 5.20 MIL/uL Final  . Hemoglobin 11/03/2017 10.2* 12.0 - 16.0 g/dL Final  . HCT 11/03/2017 29.9* 35.0 - 47.0 % Final  . MCV 11/03/2017 90.6  80.0 - 100.0 fL Final  . MCH 11/03/2017 30.8  26.0 - 34.0 pg Final  . MCHC 11/03/2017 34.0  32.0 - 36.0 g/dL Final  . RDW 11/03/2017 16.5* 11.5 - 14.5 % Final  . Platelets 11/03/2017 407  150 - 440 K/uL Final  . Neutrophils Relative % 11/03/2017 70  % Final  . Neutro Abs 11/03/2017 4.1  1.4 - 6.5 K/uL Final  . Lymphocytes Relative 11/03/2017 16  % Final  . Lymphs Abs 11/03/2017 0.9* 1.0 - 3.6 K/uL Final  . Monocytes Relative 11/03/2017 10  % Final  . Monocytes Absolute 11/03/2017 0.6  0.2 - 0.9 K/uL Final  . Eosinophils Relative 11/03/2017 3  % Final  . Eosinophils Absolute 11/03/2017 0.2  0 - 0.7 K/uL Final  . Basophils Relative 11/03/2017 1  % Final  . Basophils  Absolute 11/03/2017 0.0  0 - 0.1 K/uL Final  Performed at [facility], [address] [place], [place] [address]  . Color, Urine 11/03/2017 YELLOW* YELLOW Final  . APPearance 11/03/2017 HAZY* CLEAR Final  . Specific Gravity, Urine 11/03/2017 1.031* 1.005 - 1.030 Final  . pH 11/03/2017 5.0  5.0 - 8.0 Final  . Glucose, UA 11/03/2017 NEGATIVE  NEGATIVE mg/dL Final  . Hgb urine dipstick 11/03/2017 NEGATIVE  NEGATIVE Final  . Bilirubin Urine 11/03/2017 NEGATIVE  NEGATIVE Final  . Ketones, ur 11/03/2017 NEGATIVE  NEGATIVE mg/dL Final  . Protein, ur 11/03/2017 NEGATIVE  NEGATIVE mg/dL Final  . Nitrite 11/03/2017 NEGATIVE  NEGATIVE Final  . Leukocytes, UA 11/03/2017 MODERATE* NEGATIVE Final  . RBC / HPF 11/03/2017 0-5  0 - 5 RBC/hpf Final  . WBC, UA 11/03/2017 6-10  0 - 5 WBC/hpf Final  . Bacteria, UA 11/03/2017 NONE SEEN  NONE SEEN Final  . Squamous Epithelial / LPF 11/03/2017 0-5  0 - 5 Final  . Mucus 11/03/2017 PRESENT   Final  . Hyaline Casts, UA 11/03/2017 PRESENT   Final  . Ca Oxalate Crys, UA 11/03/2017 PRESENT   Final   Performed at [facility], [address], [place], [place] [address]    Assessment:  Teresa Meyer is a 84 y.o. female with iron deficiency anemia.  She has a history of chronic anemia and has been on/off oral iron.  Diet appears good.  She denies any bleeding. She is taking oral iron (increased from 1/week to daily on 11/03/2017).  Work-up in 09/2017 included the following normal studies: B12, folate, TSH.  Ferritin was 23.3 - 49.  Iron saturation was 3% with a TIBC of 331.  Retic was 4.7%.  Creatinine was 0.89.  Work-up on 11/03/2017 revealed a hematocrit 29.9, hemoglobin 10.2, MCV 90.6, platelets 407,000, white count 5800 with an ANC of 4100.  Ferritin was 54 with an iron saturation of 7% and a TIBC of 317.  Sed rate was 45 (0-30).  Normal studies included:  Coombs, SPEP, free light chain ratio, urinalysis.  Ferritin was 54.  Iron saturation was 7%  with a TIBC of 317.    EGD on 02/06/2009 revealed an esophageal stricture at the GE junction and duodenitis.  She underwent dilatation.  Colonoscopy on 08/04/2001 revealed sigmoid diverticulosis.  She was admitted to [facility] from 10/11/2017 - 10/14/2017 following a fall with symptomatic hyponatremia (129) and anemia (hemoglobin 6.6).  She was diagnosed with rib fractures (left 6,7,9,10).  She received 1 unit of PRBCs.  She declined GI consult.  She was started on a PPI and oral iron.   Symptomatically, she feels fine.  She has a wound on her left leg s/p fall (follow-up scheduled today).  She is taking oral iron daily.  Hemoglobin is 10.2.  Plan: 1. Discuss work-up.  Labs confirm iron deficiency anemia.  Iron saturation is low.  Ferritin appears "ok", but is low secondary to elevated sed rate (acute phase reactant).  Discuss continuation of oral iron daily with vitamin C or OJ.  Discuss IV iron if unable to replete iron stores.  Discuss follow-up in 1 month. 2. Preauth Venofer if needed. 3. RTC in 1 months for MD assessment and labs (CBC with diff, ferritin, sed rate- day before), and +/- Venofer   Honor Loh, NP  11/11/2017, 10:59 AM   I saw and evaluated the patient, participating in the key portions of the service and reviewing pertinent diagnostic studies and records.  I reviewed the nurse practitioner's note and agree with  the findings and the plan.  The assessment and plan were discussed with the patient.  A few questions were asked by the patient and answered.   Nolon Stalls, MD 11/11/2017,10:59 AM

## 2017-11-11 NOTE — Patient Instructions (Addendum)
We will refer you to wound care clinic next week. No changes for now - continue dressing changes.

## 2017-11-12 ENCOUNTER — Telehealth: Payer: Self-pay | Admitting: Family Medicine

## 2017-11-12 DIAGNOSIS — I872 Venous insufficiency (chronic) (peripheral): Secondary | ICD-10-CM | POA: Insufficient documentation

## 2017-11-12 DIAGNOSIS — D509 Iron deficiency anemia, unspecified: Secondary | ICD-10-CM | POA: Insufficient documentation

## 2017-11-12 DIAGNOSIS — S81802D Unspecified open wound, left lower leg, subsequent encounter: Secondary | ICD-10-CM

## 2017-11-12 HISTORY — DX: Unspecified open wound, left lower leg, subsequent encounter: S81.802D

## 2017-11-12 MED ORDER — COLLAGENASE 250 UNIT/GM EX OINT: 1.0000 "application " | TOPICAL_OINTMENT | Freq: Every day | CUTANEOUS | 0 refills | Status: DC

## 2017-11-12 NOTE — Assessment & Plan Note (Signed)
Poorly healing wound present for 4 weeks now. Half of initial blood blister has healed well, other half remains with scab/fibrotic tissue leading to poor, delayed healing. Anticipate will need debridement - so will refer to wound care clinic. No signs of infection at this time, ok to hold antibiotic ointment for now. Keep bandaid on for protection.

## 2017-11-12 NOTE — Telephone Encounter (Signed)
plz call [name] RN at [facility] [phone]  or [phone]) with wound care instructions while we get patient in to see wound clinic early July:  Can we use santyl collagenase cream (sent to pharmacy) for initial debridement covered by non stick gauze and gauze wrap with daily dressing change with irrigation while we await wound clinic eval. Monitor daily for signs of infection (streaking redness, fever, nausea, etc). Let me know if any questions or concerns.

## 2017-11-14 ENCOUNTER — Telehealth: Payer: Self-pay

## 2017-11-14 ENCOUNTER — Telehealth: Payer: Self-pay | Admitting: Family Medicine

## 2017-11-14 NOTE — Telephone Encounter (Signed)
Copied from [place] [id number]. Topic: Quick Communication - Rx Refill/Question >> Nov 14, 2017 10:09 AM Burchel, Abbi R wrote: Medication: hydrochlorothiazide (HYDRODIURIL) 12.5 MG tablet  Has the patient contacted their pharmacy? No.  Preferred Pharmacy (with phone number or street name):  [place], [place]  [phone] (Phone) [phone] (Fax)    Pt is unsure if she should continue taking this Rx.  If so, please refill.  Please call pt back to let her know if she should continue taking this medication. [phone].)

## 2017-11-14 NOTE — Telephone Encounter (Signed)
Spoke with [place] relaying instructions and message per Dr. Henriette Combs understanding and says she will take care of it.

## 2017-11-14 NOTE — Telephone Encounter (Signed)
Per 11/02/17 office note pt was to stop HCTZ and start lisinopril 10 mg taking one daily. I spoke with pt and she said since she called earlier today she realized that she had stopped the HCTZ and started the lisinopril. Nothing further needed.

## 2017-11-14 NOTE — Telephone Encounter (Signed)
Copied from [place] [id number]. Topic: General - Other >> Nov 14, 2017 11:47 AM Yvette Rack wrote: Reason for CRM: Nurse Jerolyn Center [phone] from [facility] calling wanting to know if they need to change pt wounds if so how do the provider want them to change the wound

## 2017-11-23 ENCOUNTER — Encounter: Payer: Medicare Other | Attending: Internal Medicine | Admitting: Internal Medicine

## 2017-11-23 DIAGNOSIS — I87332 Chronic venous hypertension (idiopathic) with ulcer and inflammation of left lower extremity: Secondary | ICD-10-CM | POA: Insufficient documentation

## 2017-11-23 DIAGNOSIS — L97221 Non-pressure chronic ulcer of left calf limited to breakdown of skin: Secondary | ICD-10-CM | POA: Diagnosis not present

## 2017-11-24 NOTE — Progress Notes (Signed)
Teresa Meyer, Teresa Meyer ([id number]) Visit Report for 11/23/2017 Abuse/Suicide Risk Screen Details Patient Name: Teresa Meyer, Teresa Meyer Date of Service: 11/23/2017 12:30 PM Medical Record Number: [id number] Patient Account Number: [id number] Date of Birth/Sex: [birth year] (84 y.o. Female) Treating RN: Roger Shelter Primary Care Enos Muhl: [name] Other Clinician: Referring Madai Nuccio: [name] Treating Saki Legore/Extender: Tito Dine in Treatment: 0 Abuse/Suicide Risk Screen Items Answer ABUSE/SUICIDE RISK SCREEN: Has anyone close to you tried to hurt or harm you recentlyo No Do you feel uncomfortable with anyone in your familyo No Has anyone forced you do things that you didnot want to doo No Do you have any thoughts of harming yourselfo No Patient displays signs or symptoms of abuse and/or neglect. No Electronic Signature(s) Signed: 11/23/2017 3:34:51 PM By: Roger Shelter Entered By: Roger Shelter on 11/23/2017 12:54:48 Teresa Meyer ([id number]) -------------------------------------------------------------------------------- Activities of Daily Living Details Patient Name: Teresa Meyer Date of Service: 11/23/2017 12:30 PM Medical Record Number: [id number] Patient Account Number: [id number] Date of Birth/Sex: 05/17/1923 (84 y.o. Female) Treating RN: Roger Shelter Primary Care Kinzy Weyers: [name] Other Clinician: Referring Yaire Kreher: [name] Treating Jak Haggar/Extender: Tito Dine in Treatment: 0 Activities of Daily Living Items Answer Activities of Daily Living (Please select one for each item) Drive Automobile Need Assistance Take Medications Need Assistance Use Telephone Need Assistance Care for Appearance Need Assistance Use Toilet Need Assistance Bath / Shower Need Assistance Dress Self Need Assistance Feed Self Need Assistance Walk Need Assistance Get In / Out Bed Need Assistance Housework Need Assistance Prepare Meals  Need Assistance Handle Money Need Assistance Shop for Self Need Assistance Electronic Signature(s) Signed: 11/23/2017 3:34:51 PM By: Roger Shelter Entered By: Roger Shelter on 11/23/2017 12:55:18 Teresa Meyer ([id number]) -------------------------------------------------------------------------------- Education Assessment Details Patient Name: Teresa Meyer Date of Service: 11/23/2017 12:30 PM Medical Record Number: [id number] Patient Account Number: [id number] Date of Birth/Sex: [birth year] (84 y.o. Female) Treating RN: Roger Shelter Primary Care Juvencio Verdi: [name] Other Clinician: Referring Ketih Goodie: [name] Treating Margaretha Mahan/Extender: Tito Dine in Treatment: 0 Primary Learner Assessed: Patient Learning Preferences/Education Level/Primary Language Learning Preference: Explanation Highest Education Level: College or Above Preferred Language: English Cognitive Barrier Assessment/Beliefs Language Barrier: No Translator Needed: No Memory Deficit: No Emotional Barrier: No Cultural/Religious Beliefs Affecting Medical Care: No Physical Barrier Assessment Impaired Vision: No Impaired Hearing: No Decreased Hand dexterity: No Knowledge/Comprehension Assessment Knowledge Level: Medium Comprehension Level: Medium Ability to understand written Medium instructions: Ability to understand verbal Medium instructions: Motivation Assessment Anxiety Level: Calm Cooperation: Cooperative Education Importance: Acknowledges Need Interest in Health Problems: Asks Questions Perception: Coherent Willingness to Engage in Self- High Management Activities: Readiness to Engage in Self- High Management Activities: Electronic Signature(s) Signed: 11/23/2017 3:34:51 PM By: Roger Shelter Entered By: Roger Shelter on 11/23/2017 12:56:43 Teresa Meyer ([id number]) -------------------------------------------------------------------------------- Fall  Risk Assessment Details Patient Name: Teresa Meyer Date of Service: 11/23/2017 12:30 PM Medical Record Number: [id number] Patient Account Number: [id number] Date of Birth/Sex: 1922-10-11 (84 y.o. Female) Treating RN: Roger Shelter Primary Care Tomaz Janis: [name] Other Clinician: Referring Aldrick Derrig: [name] Treating Jalessa Peyser/Extender: Tito Dine in Treatment: 0 Fall Risk Assessment Items Have you had 2 or more falls in the last 12 monthso 0 Yes Have you had any fall that resulted in injury in the last 12 monthso 0 Yes FALL RISK ASSESSMENT: History of falling - immediate or within 3 months 25 Yes Secondary diagnosis 0 No Ambulatory aid None/bed rest/wheelchair/nurse 0 No Crutches/cane/walker 0 No Furniture 0  No IV Access/Saline Lock 0 No Gait/Training Normal/bed rest/immobile 0 No Weak 0 No Impaired 0 No Mental Status Oriented to own ability 0 No Electronic Signature(s) Signed: 11/23/2017 3:34:51 PM By: Roger Shelter Entered By: Roger Shelter on 11/23/2017 12:56:56 Teresa Meyer ([id number]) -------------------------------------------------------------------------------- Foot Assessment Details Patient Name: Teresa Meyer Date of Service: 11/23/2017 12:30 PM Medical Record Number: [id number] Patient Account Number: [id number] Date of Birth/Sex: 1922/08/23 (84 y.o. Female) Treating RN: Roger Shelter Primary Care Jerric Oyen: [name] Other Clinician: Referring Stassi Fadely: [name] Treating Rayanne Padmanabhan/Extender: Tito Dine in Treatment: 0 Foot Assessment Items Site Locations + = Sensation present, - = Sensation absent, C = Callus, U = Ulcer R = Redness, W = Warmth, M = Maceration, PU = Pre-ulcerative lesion F = Fissure, S = Swelling, D = Dryness Assessment Right: Left: Other Deformity: No No Prior Foot Ulcer: No No Prior Amputation: No No Charcot Joint: No No Ambulatory Status: Ambulatory With  Help Assistance Device: Cane Gait: Steady Electronic Signature(s) Signed: 11/23/2017 3:34:51 PM By: Roger Shelter Entered By: Roger Shelter on 11/23/2017 12:58:59 Teresa Meyer ([id number]) -------------------------------------------------------------------------------- Nutrition Risk Assessment Details Patient Name: Teresa Meyer Date of Service: 11/23/2017 12:30 PM Medical Record Number: [id number] Patient Account Number: [id number] Date of Birth/Sex: Aug 16, 1922 (84 y.o. Female) Treating RN: Roger Shelter Primary Care Jamyria Ozanich: [name] Other Clinician: Referring Melbourne Jakubiak: [name] Treating Aviona Martenson/Extender: Tito Dine in Treatment: 0 Height (in): 60 Weight (lbs): 120 Body Mass Index (BMI): 23.4 Nutrition Risk Assessment Items NUTRITION RISK SCREEN: I have an illness or condition that made me change the kind and/or amount of 0 No food I eat I eat fewer than two meals per day 0 No I eat few fruits and vegetables, or milk products 0 No I have three or more drinks of beer, liquor or wine almost every day 0 No I have tooth or mouth problems that make it hard for me to eat 0 No I don't always have enough money to buy the food I need 0 No I eat alone most of the time 0 No I take three or more different prescribed or over-the-counter drugs a day 0 No Without wanting to, I have lost or gained 10 pounds in the last six months 0 No I am not always physically able to shop, cook and/or feed myself 0 No Nutrition Protocols Good Risk Protocol 0 No interventions needed Moderate Risk Protocol Electronic Signature(s) Signed: 11/23/2017 3:34:51 PM By: Roger Shelter Entered By: Roger Shelter on 11/23/2017 12:57:38

## 2017-11-25 NOTE — Progress Notes (Signed)
Teresa, Meyer ([id number]) Visit Report for 11/23/2017 Chief Complaint Document Details Patient Name: Teresa Meyer, Teresa Meyer Date of Service: 11/23/2017 12:30 PM Medical Record Number: [id number] Patient Account Number: [id number] Date of Birth/Sex: 02-18-1923 (84 y.o. Female) Treating RN: Cornell Barman Primary Care Provider: [name] Other Clinician: Referring Provider: [name] Treating Provider/Extender: Tito Dine in Treatment: 0 Information Obtained from: Patient Chief Complaint 11/23/17; patient is here for review of a wound on her left anterior calf Electronic Signature(s) Signed: 11/25/2017 8:34:07 AM By: Linton Ham MD Entered By: Linton Ham on 11/23/2017 13:40:22 Teresa Meyer ([id number]) -------------------------------------------------------------------------------- Debridement Details Patient Name: Teresa Meyer Date of Service: 11/23/2017 12:30 PM Medical Record Number: [id number] Patient Account Number: [id number] Date of Birth/Sex: 1923-01-09 (84 y.o. Female) Treating RN: Cornell Barman Primary Care Provider: [name] Other Clinician: Referring Provider: [name] Treating Provider/Extender: Tito Dine in Treatment: 0 Debridement Performed for Wound #1 Left,Lateral Lower Leg Assessment: Performed By: Physician Ricard Dillon, MD Debridement Type: Debridement Pre-procedure Verification/Time Yes - 13:20 Out Taken: Start Time: 13:21 Pain Control: Other : lidocaine 4% Total Area Debrided (L x W): 4.2 (cm) x 1.3 (cm) = 5.46 (cm) Tissue and other material Viable, Non-Viable, Eschar, Subcutaneous, Fibrin/Exudate debrided: Level: Skin/Subcutaneous Tissue Debridement Description: Excisional Instrument: Curette Bleeding: Moderate Hemostasis Achieved: Pressure End Time: 13:22 Response to Treatment: Procedure was tolerated well Level of Consciousness: Awake and Alert Post Debridement Measurements of Total  Wound Length: (cm) 4.2 Width: (cm) 1.3 Depth: (cm) 0.3 Volume: (cm) 1.286 Character of Wound/Ulcer Post Debridement: Stable Post Procedure Diagnosis Same as Pre-procedure Electronic Signature(s) Signed: 11/23/2017 4:43:24 PM By: Gretta Cool, BSN, RN, CWS, Kim RN, BSN Signed: 11/25/2017 8:34:07 AM By: Linton Ham MD Entered By: Linton Ham on 11/23/2017 [place], [place] L. ([id number]) -------------------------------------------------------------------------------- HPI Details Patient Name: Teresa Meyer Date of Service: 11/23/2017 12:30 PM Medical Record Number: [id number] Patient Account Number: [id number] Date of Birth/Sex: [birth year] (84 y.o. Female) Treating RN: Cornell Barman Primary Care Provider: [name] Other Clinician: Referring Provider: [name] Treating Provider/Extender: Tito Dine in Treatment: 0 History of Present Illness HPI Description: ADMISSION 11/23/17 This is a 84 year old woman who lives in the independent part of the [facility] [place] [facility] facility. She tells [place] and on May 20 she had a fall and suffered a hematoma on the left anterior leg. This eventually opened. She has essentially been left with 50% of this fully epithelialized but 50% of it nonhealing. She has been using steroid cream. She tells me she has worn compression stockings in the past but has not worn this since her injury ABIs in this clinic were 0.82 on the right 0.89 on the left The patient has a history of gastroesophageal reflux disease, soft visual stricture, hypertension melanoma excisions in 2015 and 2017 and chronic venous insufficiency Electronic Signature(s) Signed: 11/25/2017 8:34:07 AM By: Linton Ham MD Entered By: Linton Ham on 11/23/2017 13:41:54 Teresa Meyer ([id number]) -------------------------------------------------------------------------------- Physical Exam Details Patient Name: Teresa Meyer Date of Service: 11/23/2017 12:30  PM Medical Record Number: [id number] Patient Account Number: [id number] Date of Birth/Sex: 1922-12-26 (84 y.o. Female) Treating RN: Cornell Barman Primary Care Provider: [name] Other Clinician: Referring Provider: [name] Treating Provider/Extender: Tito Dine in Treatment: 0 Constitutional Patient is hypertensive.. Pulse regular and within target range for patient.Marland Kitchen Respirations regular, non-labored and within target range.. Temperature is normal and within the target range for the patient.. Ears, Nose, Mouth, and Throat Patient is hard  of hearing.Marland Kitchen Respiratory Respiratory effort is easy and symmetric bilaterally. Rate is normal at rest and on room air.. Bilateral breath sounds are clear and equal in all lobes with no wheezes, rales or rhonchi.. Cardiovascular Heart rhythm and rate regular, without murmur or gallop.she does not appear to be dehydrated. Femoral arteries without bruits and pulses strong.. Pedal pulses palpable and strong bilaterally.. Edema present in both extremities.chronic venous insufficiency. Hemosiderin deposition bilaterally. Gastrointestinal (GI) Abdomen is soft and non-distended without masses or tenderness. Bowel sounds active in all quadrants.. Musculoskeletal some forefoot deformities on the right. Integumentary (Hair, Skin) other than significant changes of chronic venous insufficiency in the bilateral lower extremities no primary skin issues. Psychiatric No evidence of depression, anxiety, or agitation. Calm, cooperative, and communicative. Appropriate interactions and affect.. Notes wound exam; the patient's areas on the anterior left calf just lateral to the tibia. There is scar tissue around this, obviously some of this is already healed. The remaining areas covered and adherent necrotic debris. Using a #5 curet this was easily debrided to reveal reasonably healthy granulation tissue. Some circumferential eschar from the  wound circumference also debrided. The wound looked quite healthy. No evidence of surrounding infection. Hemostasis with direct pressure Electronic Signature(s) Signed: 11/25/2017 8:34:07 AM By: Linton Ham MD Entered By: Linton Ham on 11/23/2017 13:45:43 Teresa Meyer ([id number]) -------------------------------------------------------------------------------- Physician Orders Details Patient Name: Teresa Meyer Date of Service: 11/23/2017 12:30 PM Medical Record Number: [id number] Patient Account Number: [id number] Date of Birth/Sex: 1923-01-21 (84 y.o. Female) Treating RN: Cornell Barman Primary Care Provider: [name] Other Clinician: Referring Provider: [name] Treating Provider/Extender: Tito Dine in Treatment: 0 Verbal / Phone Orders: No Diagnosis Coding Wound Cleansing Wound #1 Left,Lateral Lower Leg o Cleanse wound with mild soap and water Anesthetic (add to Medication List) Wound #1 Left,Lateral Lower Leg o Topical Lidocaine 4% cream applied to wound bed prior to debridement (In Clinic Only). o Benzocaine Topical Anesthetic Spray applied to wound bed prior to debridement (In Clinic Only). Skin Barriers/Peri-Wound Care o Moisturizing lotion Primary Wound Dressing Wound #1 Left,Lateral Lower Leg o Silver Collagen - moistened with hydrogel ([place] Jelly) Secondary Dressing Wound #1 Left,Lateral Lower Leg o ABD pad Dressing Change Frequency Wound #1 Left,Lateral Lower Leg o Change Dressing Monday, Wednesday, Friday Follow-up Appointments Wound #1 Left,Lateral Lower Leg o Return Appointment in 1 week. Edema Control o Kerlix and Coban - Left Lower Extremity - From base of toes to 3cm below bend of knee. Electronic Signature(s) Signed: 11/23/2017 4:43:24 PM By: Gretta Cool, BSN, RN, CWS, Kim RN, BSN Signed: 11/25/2017 8:34:07 AM By: Linton Ham MD Entered By: Gretta Cool, BSN, RN, CWS, Kim on 11/23/2017 13:26:49 Teresa Meyer  ([id number]) -------------------------------------------------------------------------------- Problem List Details Patient Name: Teresa Meyer Date of Service: 11/23/2017 12:30 PM Medical Record Number: [id number] Patient Account Number: [id number] Date of Birth/Sex: 1922/07/31 (84 y.o. Female) Treating RN: Cornell Barman Primary Care Provider: [name] Other Clinician: Referring Provider: [name] Treating Provider/Extender: Tito Dine in Treatment: 0 Active Problems ICD-10 Evaluated Encounter Code Description Active Date Today Diagnosis S81.812D Laceration without foreign body, left lower leg, subsequent 11/23/2017 Yes Yes encounter Status Complications Interventions Other new patient. fall on May 20. She is already had some Medical healing however the Decision remaining area Making : covered and necrotic debris. This will require debridement L97.221 Non-pressure chronic ulcer of left calf limited to breakdown 11/23/2017 Yes Yes of skin Status Complications Interventions Other new wound entire remaining surface area this  was covered in Medical adherent necrotic Decision debris debridement Making : done. Surface of the wound looks healthy. Primary dressings over collagen I87.332 Chronic venous hypertension (idiopathic) with ulcer and 11/23/2017 Yes Yes inflammation of left lower extremity Status Complications Interventions Other patient has underlying chronic venous changes with severe I elected to use skin damage. This was a laceration injury however she is at [employer] risk for further skin breakdown. There is some swelling. however I think if Decision she requires 3 alert Making : compression she would tolerate this. Inactive Problems Resolved Problems MICKENZIE, STOLAR ([id number]) Electronic Signature(s) Signed: 11/25/2017 8:34:07 AM By: Linton Ham MD Entered By: Linton Ham on 11/23/2017 13:39:28 Teresa Meyer  ([id number]) -------------------------------------------------------------------------------- Progress Note Details Patient Name: Teresa Meyer Date of Service: 11/23/2017 12:30 PM Medical Record Number: [id number] Patient Account Number: [id number] Date of Birth/Sex: 05/12/1923 (84 y.o. Female) Treating RN: Cornell Barman Primary Care Provider: [name] Other Clinician: Referring Provider: [name] Treating Provider/Extender: Tito Dine in Treatment: 0 Subjective Chief Complaint Information obtained from Patient 11/23/17; patient is here for review of a wound on her left anterior calf History of Present Illness (HPI) ADMISSION 11/23/17 This is a 84 year old woman who lives in the independent part of the [facility] [place] [facility] facility. She tells [place] and on May 20 she had a fall and suffered a hematoma on the left anterior leg. This eventually opened. She has essentially been left with 50% of this fully epithelialized but 50% of it nonhealing. She has been using steroid cream. She tells me she has worn compression stockings in the past but has not worn this since her injury ABIs in this clinic were 0.82 on the right 0.89 on the left The patient has a history of gastroesophageal reflux disease, soft visual stricture, hypertension melanoma excisions in 2015 and 2017 and chronic venous insufficiency Wound History Patient presents with 1 open wound that has been present for approximately 1 month. Patient has been treating wound in the following manner: steriod cream. Laboratory tests have not been performed in the last month. Patient reportedly has not tested positive for an antibiotic resistant organism. Patient reportedly has not tested positive for osteomyelitis. Patient reportedly has not had testing performed to evaluate circulation in the legs. Patient History Allergies codiene, statins, sulfa, procaine Family History Cancer - Mother,Siblings, Lung Disease -  Siblings, Thyroid Problems - Child, No family history of Diabetes, Heart Disease, Hypertension, Kidney Disease, Seizures, Stroke, Tuberculosis. Social History Never smoker, Marital Status - Widowed, Alcohol Use - Moderate, Drug Use - No History, Caffeine Use - Daily. Medical History Eyes Patient has history of Cataracts - removed left Denies history of Glaucoma, Optic Neuritis Ear/Nose/Mouth/Throat Denies history of Chronic sinus problems/congestion Hematologic/Lymphatic Patient has history of Anemia, Lymphedema Denies history of Hemophilia, Human Immunodeficiency Virus, Sickle Cell Disease Pence, Ellean L. ([id number]) Respiratory Denies history of Aspiration, Asthma, Chronic Obstructive Pulmonary Disease (COPD), Pneumothorax, Sleep Apnea, Tuberculosis Cardiovascular Patient has history of Hypertension Denies history of Angina, Arrhythmia, Congestive Heart Failure, Coronary Artery Disease, Deep Vein Thrombosis, Hypotension, Myocardial Infarction, Peripheral Arterial Disease, Peripheral Venous Disease, Phlebitis, Vasculitis Gastrointestinal Denies history of Cirrhosis , Colitis, Crohn s, Hepatitis A, Hepatitis B, Hepatitis C Endocrine Denies history of Type I Diabetes Genitourinary Denies history of End Stage Renal Disease Immunological Denies history of Lupus Erythematosus, Raynaud s, Scleroderma Integumentary (Skin) Denies history of History of Burn, History of pressure wounds Musculoskeletal Patient has history of Rheumatoid Arthritis, Osteoarthritis Denies history of Gout  Neurologic Denies history of Dementia, Neuropathy, Quadriplegia, Paraplegia, Seizure Disorder Psychiatric Denies history of Anorexia/bulimia, Confinement Anxiety Review of Systems (ROS) Eyes Complains or has symptoms of Glasses / Contacts - glasses. Ear/Nose/Mouth/Throat Denies complaints or symptoms of Difficult clearing ears, Sinusitis, wears bilateral hearing aides Hematologic/Lymphatic Denies  complaints or symptoms of Bleeding / Clotting Disorders, Human Immunodeficiency Virus. Respiratory Complains or has symptoms of Shortness of Breath - exertion. Denies complaints or symptoms of Chronic or frequent coughs. Cardiovascular Complains or has symptoms of LE edema. Denies complaints or symptoms of Chest pain. Gastrointestinal Denies complaints or symptoms of Frequent diarrhea, Nausea, Vomiting. Endocrine Denies complaints or symptoms of Hepatitis, Thyroid disease, Polydypsia (Excessive Thirst). Genitourinary Denies complaints or symptoms of Kidney failure/ Dialysis, Incontinence/dribbling. Immunological Denies complaints or symptoms of Hives, Itching. Integumentary (Skin) Complains or has symptoms of Wounds, Bleeding or bruising tendency. Denies complaints or symptoms of Breakdown, Swelling. Musculoskeletal Denies complaints or symptoms of Muscle Pain, Muscle Weakness. Neurologic Denies complaints or symptoms of Numbness/parasthesias, Focal/Weakness. Oncologic The patient has no complaints or symptoms. Psychiatric Denies complaints or symptoms of Anxiety, Claustrophobia. MARLISS, BUTTACAVOLI ([id number]) Objective Constitutional Patient is hypertensive.. Pulse regular and within target range for patient.Marland Kitchen Respirations regular, non-labored and within target range.. Temperature is normal and within the target range for the patient.. Vitals Time Taken: 12:45 PM, Height: 60 in, Source: Stated, Weight: 120 lbs, Source: Stated, BMI: 23.4, Temperature: 98.0  F, Pulse: 79 bpm, Respiratory Rate: 18 breaths/min, Blood Pressure: 144/77 mmHg. Ears, Nose, Mouth, and Throat Patient is hard of hearing.Marland Kitchen Respiratory Respiratory effort is easy and symmetric bilaterally. Rate is normal at rest and on room air.. Bilateral breath sounds are clear and equal in all lobes with no wheezes, rales or rhonchi.. Cardiovascular Heart rhythm and rate regular, without murmur or gallop.she does not  appear to be dehydrated. Femoral arteries without bruits and pulses strong.. Pedal pulses palpable and strong bilaterally.. Edema present in both extremities.chronic venous insufficiency. Hemosiderin deposition bilaterally. Gastrointestinal (GI) Abdomen is soft and non-distended without masses or tenderness. Bowel sounds active in all quadrants.. Musculoskeletal some forefoot deformities on the right. Psychiatric No evidence of depression, anxiety, or agitation. Calm, cooperative, and communicative. Appropriate interactions and affect.. General Notes: wound exam; the patient's areas on the anterior left calf just lateral to the tibia. There is scar tissue around this, obviously some of this is already healed. The remaining areas covered and adherent necrotic debris. Using a #5 curet this was easily debrided to reveal reasonably healthy granulation tissue. Some circumferential eschar from the wound circumference also debrided. The wound looked quite healthy. No evidence of surrounding infection. Hemostasis with direct pressure Integumentary (Hair, Skin) other than significant changes of chronic venous insufficiency in the bilateral lower extremities no primary skin issues. Wound #1 status is Open. Original cause of wound was Trauma. The wound is located on the Left,Lateral Lower Leg. The wound measures 4.2cm length x 1.3cm width x 0.3cm depth; 4.288cm^2 area and 1.286cm^3 volume. There is no tunneling or undermining noted. There is a none present amount of drainage noted. The wound margin is distinct with the outline attached to the wound base. There is no granulation within the wound bed. There is a large (67-100%) amount of necrotic tissue within the wound bed including Eschar. The periwound skin appearance did not exhibit: Callus, Crepitus, Excoriation, Induration, Rash, Scarring, Dry/Scaly, Maceration, Atrophie Blanche, Cyanosis, Ecchymosis, Hemosiderin Staining, Mottled, Pallor, Rubor,  Erythema. Periwound temperature was noted as No Abnormality. JAKHIA, BUXTON L. ([id number]) Assessment Active  Problems ICD-10 Laceration without foreign body, left lower leg, subsequent encounter Non-pressure chronic ulcer of left calf limited to breakdown of skin Chronic venous hypertension (idiopathic) with ulcer and inflammation of left lower extremity Procedures Wound #1 Pre-procedure diagnosis of Wound #1 is a Trauma, Other located on the Left,Lateral Lower Leg . There was a Excisional Skin/Subcutaneous Tissue Debridement with a total area of 5.46 sq cm performed by Ricard Dillon, MD. With the following instrument(s): Curette to remove Viable and Non-Viable tissue/material. Material removed includes Eschar, Subcutaneous Tissue, and Fibrin/Exudate after achieving pain control using Other (lidocaine 4%). No specimens were taken. A time out was conducted at 13:20, prior to the start of the procedure. A Moderate amount of bleeding was controlled with Pressure. The procedure was tolerated well. Patient s Level of Consciousness post procedure was recorded as Awake and Alert. Post Debridement Measurements: 4.2cm length x 1.3cm width x 0.3cm depth; 1.286cm^3 volume. Character of Wound/Ulcer Post Debridement is stable. Post procedure Diagnosis Wound #1: Same as Pre-Procedure Plan Wound Cleansing: Wound #1 Left,Lateral Lower Leg: Cleanse wound with mild soap and water Anesthetic (add to Medication List): Wound #1 Left,Lateral Lower Leg: Topical Lidocaine 4% cream applied to wound bed prior to debridement (In Clinic Only). Benzocaine Topical Anesthetic Spray applied to wound bed prior to debridement (In Clinic Only). Skin Barriers/Peri-Wound Care: Moisturizing lotion Primary Wound Dressing: Wound #1 Left,Lateral Lower Leg: Silver Collagen - moistened with hydrogel ([place] Jelly) Secondary Dressing: Wound #1 Left,Lateral Lower Leg: ABD pad Dressing Change Frequency: Wound #1 Left,Lateral  Lower Leg: Change Dressing Monday, Wednesday, Friday Follow-up Appointments: Wound #1 Left,Lateral Lower Leg: Return Appointment in 1 week. Edema Control: Kerlix and Coban - Left Lower Extremity - From base of toes to 3cm below bend of knee. Medical Decision Making NGOZI, ALVIDREZ ([id number]) Laceration without foreign body, left lower leg, subsequent encounter 11/23/2017 Status: Other Complications: new patient. Interventions: fall on May 20. She is already had some healing however the remaining area covered and necrotic debris. This will require debridement Non-pressure chronic ulcer of left calf limited to breakdown of skin 11/23/2017 Status: Other Complications: new wound Interventions: entire remaining surface area this was covered in adherent necrotic debris debridement done. Surface of the wound looks healthy. Primary dressings over collagen Chronic venous hypertension (idiopathic) with ulcer and inflammation of left lower extremity 11/23/2017 Status: Other Complications: patient has underlying chronic venous changes with severe skin damage. This was a laceration injury however she is at risk for further skin breakdown. There is some swelling. Interventions: I elected to use Kerlix Coban however I think if she requires 3 alert compression she would tolerate this. #1the wound cleaned up really quite nicely after debridement. Therefore I went with silver collagen moistened with hydrogel/ABDs and 2 layer compression #2 the patient has chronic venous insufficiency with some edema. I think we could go to more aggressive compression if need be #3 the patient lives at when likes she can get this changed at the healthcare Center on Friday and Monday and will see her back next week. #4 I'm hopeful that she will not require further debridement either mechanically or with primary dressings. Electronic Signature(s) Signed: 11/25/2017 8:34:07 AM By: Linton Ham MD Entered By: Linton Ham  on 11/23/2017 13:47:05 Teresa Meyer ([id number]) -------------------------------------------------------------------------------- ROS/PFSH Details Patient Name: Teresa Meyer Date of Service: 11/23/2017 12:30 PM Medical Record Number: [id number] Patient Account Number: [id number] Date of Birth/Sex: 11-Jan-1923 (84 y.o. Female) Treating RN: Roger Shelter Primary Care Provider: [name]  Other Clinician: Referring Provider: [name] Treating Provider/Extender: Ricard Dillon Weeks in Treatment: 0 Wound History Do you currently have one or more open woundso Yes How many open wounds do you currently haveo 1 Approximately how long have you had your woundso 1 month How have you been treating your wound(s) until nowo steriod cream Has your wound(s) ever healed and then re-openedo No Have you had any lab work done in the past montho No Have you tested positive for an antibiotic resistant organism (MRSA, VRE)o No Have you tested positive for osteomyelitis (bone infection)o No Have you had any tests for circulation on your legso No Eyes Complaints and Symptoms: Positive for: Glasses / Contacts - glasses Medical History: Positive for: Cataracts - removed left Negative for: Glaucoma; Optic Neuritis Ear/Nose/Mouth/Throat Complaints and Symptoms: Negative for: Difficult clearing ears; Sinusitis Review of System Notes: wears bilateral hearing aides Medical History: Negative for: Chronic sinus problems/congestion Hematologic/Lymphatic Complaints and Symptoms: Negative for: Bleeding / Clotting Disorders; Human Immunodeficiency Virus Medical History: Positive for: Anemia; Lymphedema Negative for: Hemophilia; Human Immunodeficiency Virus; Sickle Cell Disease Respiratory Complaints and Symptoms: Positive for: Shortness of Breath - exertion Negative for: Chronic or frequent coughs Medical History: Negative for: Aspiration; Asthma; Chronic Obstructive Pulmonary Disease  (COPD); Pneumothorax; Sleep Apnea; Tuberculosis Cardiovascular BERDELLA, BACOT L. ([id number]) Complaints and Symptoms: Positive for: LE edema Negative for: Chest pain Medical History: Positive for: Hypertension Negative for: Angina; Arrhythmia; Congestive Heart Failure; Coronary Artery Disease; Deep Vein Thrombosis; Hypotension; Myocardial Infarction; Peripheral Arterial Disease; Peripheral Venous Disease; Phlebitis; Vasculitis Gastrointestinal Complaints and Symptoms: Negative for: Frequent diarrhea; Nausea; Vomiting Medical History: Negative for: Cirrhosis ; Colitis; Crohnos; Hepatitis A; Hepatitis B; Hepatitis C Endocrine Complaints and Symptoms: Negative for: Hepatitis; Thyroid disease; Polydypsia (Excessive Thirst) Medical History: Negative for: Type I Diabetes Genitourinary Complaints and Symptoms: Negative for: Kidney failure/ Dialysis; Incontinence/dribbling Medical History: Negative for: End Stage Renal Disease Immunological Complaints and Symptoms: Negative for: Hives; Itching Medical History: Negative for: Lupus Erythematosus; Raynaudos; Scleroderma Integumentary (Skin) Complaints and Symptoms: Positive for: Wounds; Bleeding or bruising tendency Negative for: Breakdown; Swelling Medical History: Negative for: History of Burn; History of pressure wounds Musculoskeletal Complaints and Symptoms: Negative for: Muscle Pain; Muscle Weakness Medical History: Positive for: Rheumatoid Arthritis; Osteoarthritis Negative for: Gout Dragone, Arleny L. ([id number]) Neurologic Complaints and Symptoms: Negative for: Numbness/parasthesias; Focal/Weakness Medical History: Negative for: Dementia; Neuropathy; Quadriplegia; Paraplegia; Seizure Disorder Psychiatric Complaints and Symptoms: Negative for: Anxiety; Claustrophobia Medical History: Negative for: Anorexia/bulimia; Confinement Anxiety Oncologic Complaints and Symptoms: No Complaints or Symptoms HBO Extended History  Items Eyes: Cataracts Immunizations Pneumococcal Vaccine: Received Pneumococcal Vaccination: No Implantable Devices Family and Social History Cancer: Yes - Mother,Siblings; Diabetes: No; Heart Disease: No; Hypertension: No; Kidney Disease: No; Lung Disease: Yes - Siblings; Seizures: No; Stroke: No; Thyroid Problems: Yes - Child; Tuberculosis: No; Never smoker; Marital Status - Widowed; Alcohol Use: Moderate; Drug Use: No History; Caffeine Use: Daily; Financial Concerns: No; Food, Clothing or Shelter Needs: No; Support System Lacking: No; Transportation Concerns: No; Advanced Directives: Yes (Not Provided); Do not resuscitate: No; Living Will: Yes (Not Provided) Electronic Signature(s) Signed: 11/23/2017 3:34:51 PM By: Roger Shelter Signed: 11/25/2017 8:34:07 AM By: Linton Ham MD Entered By: Roger Shelter on 11/23/2017 12:54:29 Teresa Meyer ([id number]) -------------------------------------------------------------------------------- [place] Details Patient Name: Teresa Meyer Date of Service: 11/23/2017 Medical Record Number: [id number] Patient Account Number: [id number] Date of Birth/Sex: [birth year] (84 y.o. Female) Treating RN: Cornell Barman Primary Care Provider: [name] Other Clinician: Referring Provider: [name]  Treating Provider/Extender: Ricard Dillon Weeks in Treatment: 0 Diagnosis Coding ICD-10 Codes Code Description S81.812D Laceration without foreign body, left lower leg, subsequent encounter L97.221 Non-pressure chronic ulcer of left calf limited to breakdown of skin I87.332 Chronic venous hypertension (idiopathic) with ulcer and inflammation of left lower extremity Facility Procedures CPT4 Code: 17616073 Description: 71062 - WOUND CARE VISIT-LEV 3 EST PT Modifier: Quantity: 1 CPT4 Code: 69485462 Description: 70350 - DEB SUBQ TISSUE 20 SQ CM/< ICD-10 Diagnosis Description L97.221 Non-pressure chronic ulcer of left calf limited to  breakdown S81.812D Laceration without foreign body, left lower leg, subsequent e Modifier: of skin ncounter Quantity: 1 Physician Procedures CPT4: Description Modifier Quantity Code 0938182 WC PHYS LEVEL 3 o NEW PT 25 1 ICD-10 Diagnosis Description L97.221 Non-pressure chronic ulcer of left calf limited to breakdown of skin S81.812D Laceration without foreign body, left lower leg, subsequent  encounter I87.332 Chronic venous hypertension (idiopathic) with ulcer and inflammation of left lower extremity CPT4: 9937169 11042 - WC PHYS SUBQ TISS 20 SQ CM 1 ICD-10 Diagnosis Description L97.221 Non-pressure chronic ulcer of left calf limited to breakdown of skin S81.812D Laceration without foreign body, left lower leg, subsequent encounter Electronic Signature(s) Signed: 11/25/2017 8:34:07 AM By: Linton Ham MD Entered By: Linton Ham on 11/23/2017 13:48:09

## 2017-11-25 NOTE — Progress Notes (Signed)
HAYSLEE, CASEBOLT ([id number]) Visit Report for 11/23/2017 Allergy List Details Patient Name: Teresa Meyer, Teresa Meyer Date of Service: 11/23/2017 12:30 PM Medical Record Number: [id number] Patient Account Number: [id number] Date of Birth/Sex: January 04, 1923 (84 y.o. Female) Treating RN: Roger Shelter Primary Care Verdelle Valtierra: [name] Other Clinician: Referring Elier Zellars: [name] Treating Olene Godfrey/Extender: Ricard Dillon Weeks in Treatment: 0 Allergies Active Allergies codiene statins sulfa procaine Allergy Notes Electronic Signature(s) Signed: 11/23/2017 3:34:51 PM By: Roger Shelter Entered By: Roger Shelter on 11/23/2017 12:47:01 Teresa Meyer ([id number]) -------------------------------------------------------------------------------- Arrival Information Details Patient Name: Teresa Meyer Date of Service: 11/23/2017 12:30 PM Medical Record Number: [id number] Patient Account Number: [id number] Date of Birth/Sex: 03/28/1923 (84 y.o. Female) Treating RN: Roger Shelter Primary Care Baldo Hufnagle: [name] Other Clinician: Referring Travus Oren: [name] Treating Juliane Guest/Extender: Tito Dine in Treatment: 0 Visit Information Patient Arrived: Ambulatory Arrival Time: 12:40 Accompanied By: caregiver Transfer Assistance: None Patient Identification Verified: Yes Secondary Verification Process Completed: Yes Electronic Signature(s) Signed: 11/23/2017 3:34:51 PM By: Roger Shelter Entered By: Roger Shelter on 11/23/2017 12:44:58 Teresa Meyer ([id number]) -------------------------------------------------------------------------------- Clinic Level of Care Assessment Details Patient Name: Teresa Meyer Date of Service: 11/23/2017 12:30 PM Medical Record Number: [id number] Patient Account Number: [id number] Date of Birth/Sex: 02/13/1923 (84 y.o. Female) Treating RN: Cornell Barman Primary Care Elina Streng: [name] Other  Clinician: Referring Marl Seago: [name] Treating Harbour Nordmeyer/Extender: Tito Dine in Treatment: 0 Clinic Level of Care Assessment Items TOOL 1 Quantity Score []  - Use when EandM and Procedure is performed on INITIAL visit 0 ASSESSMENTS - Nursing Assessment / Reassessment X - General Physical Exam (combine w/ comprehensive assessment (listed just below) when 1 20 performed on new pt. evals) X- 1 25 Comprehensive Assessment (HX, ROS, Risk Assessments, Wounds Hx, etc.) ASSESSMENTS - Wound and Skin Assessment / Reassessment []  - Dermatologic / Skin Assessment (not related to wound area) 0 ASSESSMENTS - Ostomy and/or Continence Assessment and Care []  - Incontinence Assessment and Management 0 []  - 0 Ostomy Care Assessment and Management (repouching, etc.) PROCESS - Coordination of Care X - Simple Patient / Family Education for ongoing care 1 15 []  - 0 Complex (extensive) Patient / Family Education for ongoing care []  - 0 Staff obtains Programmer, systems, Records, Test Results / Process Orders []  - 0 Staff telephones HHA, Nursing Homes / Clarify orders / etc []  - 0 Routine Transfer to another Facility (non-emergent condition) []  - 0 Routine Hospital Admission (non-emergent condition) X- 1 15 New Admissions / Biomedical engineer / Ordering NPWT, Apligraf, etc. []  - 0 Emergency Hospital Admission (emergent condition) PROCESS - Special Needs []  - Pediatric / Minor Patient Management 0 []  - 0 Isolation Patient Management []  - 0 Hearing / Language / Visual special needs []  - 0 Assessment of Community assistance (transportation, D/C planning, etc.) []  - 0 Additional assistance / Altered mentation []  - 0 Support Surface(s) Assessment (bed, cushion, seat, etc.) Teresa Meyer, Teresa L. ([id number]) INTERVENTIONS - Miscellaneous []  - External ear exam 0 []  - 0 Patient Transfer (multiple staff / Civil Service fast streamer / Similar devices) []  - 0 Simple Staple / Suture removal (25 or  less) []  - 0 Complex Staple / Suture removal (26 or more) []  - 0 Hypo/Hyperglycemic Management (do not check if billed separately) X- 1 15 Ankle / Brachial Index (ABI) - do not check if billed separately Has the patient been seen at the hospital within the last three years: Yes Total Score: 90 Level Of Care: New/Established - Level 3  Electronic Signature(s) Signed: 11/23/2017 4:43:24 PM By: Gretta Cool, BSN, RN, CWS, Kim RN, BSN Entered By: Gretta Cool, BSN, RN, CWS, Kim on 11/23/2017 13:27:33 Teresa Meyer ([id number]) -------------------------------------------------------------------------------- Encounter Discharge Information Details Patient Name: Teresa Meyer Date of Service: 11/23/2017 12:30 PM Medical Record Number: [id number] Patient Account Number: [id number] Date of Birth/Sex: Jul 29, 1922 (84 y.o. Female) Treating RN: Roger Shelter Primary Care Wade Sigala: [name] Other Clinician: Referring Kenzie Flakes: [name] Treating Dean Goldner/Extender: Tito Dine in Treatment: 0 Encounter Discharge Information Items Discharge Condition: Stable Ambulatory Status: Cane Discharge Destination: Home Transportation: Private Auto Schedule Follow-up Appointment: Yes Clinical Summary of Care: Electronic Signature(s) Signed: 11/23/2017 3:34:51 PM By: Roger Shelter Entered By: Roger Shelter on 11/23/2017 13:40:18 Teresa Meyer ([id number]) -------------------------------------------------------------------------------- Lower Extremity Assessment Details Patient Name: Teresa Meyer Date of Service: 11/23/2017 12:30 PM Medical Record Number: [id number] Patient Account Number: [id number] Date of Birth/Sex: 18-Mar-1923 (84 y.o. Female) Treating RN: Roger Shelter Primary Care Jaime Grizzell: [name] Other Clinician: Referring Seyon Strader: [name] Treating Ashelyn Mccravy/Extender: Tito Dine in Treatment: 0 Edema Assessment Assessed: [Left: No]  [Right: No] [Left: Edema] [Right: :] Calf Left: Right: Point of Measurement: 30 cm From Medial Instep 31 cm cm Ankle Left: Right: Point of Measurement: 10 cm From Medial Instep 22.5 cm cm Vascular Assessment Claudication: Claudication Assessment [Left:None] Pulses: Dorsalis Pedis Palpable: [Left:Yes] Posterior Tibial Extremity colors, hair growth, and conditions: Extremity Color: [Left:Hyperpigmented] Hair Growth on Extremity: [Left:Yes] Temperature of Extremity: [Left:Warm] Capillary Refill: [Left:< 3 seconds] Blood Pressure: Brachial: [Left:178] [Right:144] Dorsalis Pedis: 158 [Left:Dorsalis Pedis: 146] Ankle: Posterior Tibial: 152 [Left:Posterior Tibial: 142 0.89] [Right:0.82] Toe Nail Assessment Left: Right: Thick: No No Discolored: No No Deformed: No No Improper Length and Hygiene: No Electronic Signature(s) Signed: 11/23/2017 3:34:51 PM By: Roger Shelter Entered By: Roger Shelter on 11/23/2017 13:15:25 Teresa Meyer ([id number]) -------------------------------------------------------------------------------- Multi Wound Chart Details Patient Name: Teresa Meyer Date of Service: 11/23/2017 12:30 PM Medical Record Number: [id number] Patient Account Number: [id number] Date of Birth/Sex: June 08, 1922 (84 y.o. Female) Treating RN: Cornell Barman Primary Care Avry Monteleone: [name] Other Clinician: Referring Iasia Forcier: [name] Treating Karalyn Kadel/Extender: Tito Dine in Treatment: 0 Vital Signs Height(in): 60 Pulse(bpm): 13 Weight(lbs): 120 Blood Pressure(mmHg): 144/77 Body Mass Index(BMI): 23 Temperature(F): 98.0 Respiratory Rate 18 (breaths/min): Photos: [1:No Photos] [N/A:N/A] Wound Location: [1:Left, Lateral Lower Leg] [N/A:N/A] Wounding Event: [1:Trauma] [N/A:N/A] Primary Etiology: [1:Trauma, Other] [N/A:N/A] Comorbid History: [1:Cataracts, Anemia, Lymphedema, Hypertension, Rheumatoid Arthritis, Osteoarthritis] [N/A:N/A] Date  Acquired: [1:10/10/2017] [N/A:N/A] Weeks of Treatment: [1:0] [N/A:N/A] Wound Status: [1:Open] [N/A:N/A] Measurements L x W x D [1:4.2x1.3x0.3] [N/A:N/A] (cm) Area (cm) : [1:4.288] [N/A:N/A] Volume (cm) : [1:1.286] [N/A:N/A] % Reduction in Area: [1:0.00%] [N/A:N/A] % Reduction in Volume: [1:3.20%] [N/A:N/A] Classification: [1:Full Thickness Without Exposed Support Structures] [N/A:N/A] Exudate Amount: [1:None Present] [N/A:N/A] Wound Margin: [1:Distinct, outline attached] [N/A:N/A] Granulation Amount: [1:None Present (0%)] [N/A:N/A] Necrotic Amount: [1:Large (67-100%)] [N/A:N/A] Necrotic Tissue: [1:Eschar] [N/A:N/A] Exposed Structures: [1:Fascia: No Fat Layer (Subcutaneous Tissue) Exposed: No Tendon: No Muscle: No Joint: No Bone: No] [N/A:N/A] Epithelialization: [1:None] [N/A:N/A] Debridement: [1:Debridement - Excisional] [N/A:N/A] Pre-procedure [1:13:20] [N/A:N/A] Verification/Time Out Taken: Pain Control: [1:Other] [N/A:N/A] Tissue Debrided: [N/A:N/A] Necrotic/Eschar, Subcutaneous Level: Skin/Subcutaneous Tissue N/A N/A Debridement Area (sq cm): 5.46 N/A N/A Instrument: Curette N/A N/A Bleeding: Moderate N/A N/A Hemostasis Achieved: Pressure N/A N/A Debridement Treatment Procedure was tolerated well N/A N/A Response: Post Debridement 4.2x1.3x0.3 N/A N/A Measurements L x W x D (cm) Post Debridement Volume: 1.286 N/A N/A (cm) Periwound Skin Texture:  Excoriation: No N/A N/A Induration: No Callus: No Crepitus: No Rash: No Scarring: No Periwound Skin Moisture: Maceration: No N/A N/A Dry/Scaly: No Periwound Skin Color: Atrophie Blanche: No N/A N/A Cyanosis: No Ecchymosis: No Erythema: No Hemosiderin Staining: No Mottled: No Pallor: No Rubor: No Temperature: No Abnormality N/A N/A Tenderness on Palpation: No N/A N/A Wound Preparation: Ulcer Cleansing: N/A N/A Rinsed/Irrigated with Saline Topical Anesthetic Applied: Other: lidocaine 4% Procedures  Performed: Debridement N/A N/A Treatment Notes Wound #1 (Left, Lateral Lower Leg) 1. Cleansed with: Clean wound with Normal Saline 2. Anesthetic Topical Lidocaine 4% cream to wound bed prior to debridement 4. Dressing Applied: Hydrogel Prisma Ag 5. Secondary Dressing Applied ABD Pad Notes kerlix and coban wrap with sleeve Electronic Signature(s) Teresa Meyer, Teresa Meyer ([id number]) Signed: 11/25/2017 8:34:07 AM By: Linton Ham MD Entered By: Linton Ham on 11/23/2017 13:39:43 Teresa Meyer ([id number]) -------------------------------------------------------------------------------- Multi-Disciplinary Care Plan Details Patient Name: Teresa Meyer Date of Service: 11/23/2017 12:30 PM Medical Record Number: [id number] Patient Account Number: [id number] Date of Birth/Sex: 1922/07/16 (84 y.o. Female) Treating RN: Cornell Barman Primary Care Shonta Phillis: [name] Other Clinician: Referring Amrita Radu: [name] Treating Parthenia Tellefsen/Extender: Tito Dine in Treatment: 0 Active Inactive ` Necrotic Tissue Nursing Diagnoses: Impaired tissue integrity related to necrotic/devitalized tissue Goals: Necrotic/devitalized tissue will be minimized in the wound bed Date Initiated: 11/23/2017 Target Resolution Date: 12/24/2017 Goal Status: Active Interventions: Provide education on necrotic tissue and debridement process Treatment Activities: Apply topical anesthetic as ordered : 11/23/2017 Notes: ` Orientation to the Wound Care Program Nursing Diagnoses: Knowledge deficit related to the wound healing center program Goals: Patient/caregiver will verbalize understanding of the [facility] Program Date Initiated: 11/23/2017 Target Resolution Date: 12/24/2017 Goal Status: Active Interventions: Provide education on orientation to the wound center Notes: ` Venous Leg Ulcer Nursing Diagnoses: Actual venous Insuffiency (use after diagnosis is confirmed) Goals: Patient  will maintain optimal edema control Date Initiated: 11/23/2017 Target Resolution Date: 12/24/2017 Teresa Meyer, Teresa Meyer ([id number]) Goal Status: Active Interventions: Assess peripheral edema status every visit. Treatment Activities: Therapeutic compression applied : 11/23/2017 Notes: ` Wound/Skin Impairment Nursing Diagnoses: Impaired tissue integrity Goals: Ulcer/skin breakdown will have a volume reduction of 30% by week 4 Date Initiated: 11/23/2017 Target Resolution Date: 12/24/2017 Goal Status: Active Interventions: Assess ulceration(s) every visit Treatment Activities: Topical wound management initiated : 11/23/2017 Notes: Electronic Signature(s) Signed: 11/23/2017 4:43:24 PM By: Gretta Cool, BSN, RN, CWS, Kim RN, BSN Entered By: Gretta Cool, BSN, RN, CWS, Kim on 11/23/2017 13:21:37 Teresa Meyer ([id number]) -------------------------------------------------------------------------------- Pain Assessment Details Patient Name: Teresa Meyer Date of Service: 11/23/2017 12:30 PM Medical Record Number: [id number] Patient Account Number: [id number] Date of Birth/Sex: 1923-04-20 (84 y.o. Female) Treating RN: Roger Shelter Primary Care Yohanna Tow: [name] Other Clinician: Referring Makenley Shimp: [name] Treating Jayd Forrey/Extender: Ricard Dillon Weeks in Treatment: 0 Active Problems Location of Pain Severity and Description of Pain Patient Has Paino No Site Locations Pain Management and Medication Current Pain Management: Electronic Signature(s) Signed: 11/23/2017 3:34:51 PM By: Roger Shelter Entered By: Roger Shelter on 11/23/2017 12:45:05 Teresa Meyer ([id number]) -------------------------------------------------------------------------------- Patient/Caregiver Education Details Patient Name: Teresa Meyer Date of Service: 11/23/2017 12:30 PM Medical Record Number: [id number] Patient Account Number: [id number] Date of Birth/Gender: March 27, 1923 (84 y.o. Female) Treating RN:  Roger Shelter Primary Care Physician: [name] Other Clinician: Referring Physician: [name] Treating Physician/Extender: Tito Dine in Treatment: 0 Education Assessment Education Provided To: Patient Education Topics Provided Welcome To [facility]: Handouts: Welcome  To [facility] Methods: Explain/Verbal Responses: State content correctly Wound Debridement: Handouts: Wound Debridement Methods: Explain/Verbal Responses: State content correctly Wound/Skin Impairment: Handouts: Caring for Your Ulcer Methods: Explain/Verbal Responses: State content correctly Electronic Signature(s) Signed: 11/23/2017 3:34:51 PM By: Roger Shelter Entered By: Roger Shelter on 11/23/2017 13:40:40 Teresa Meyer ([id number]) -------------------------------------------------------------------------------- Wound Assessment Details Patient Name: Teresa Meyer Date of Service: 11/23/2017 12:30 PM Medical Record Number: [id number] Patient Account Number: [id number] Date of Birth/Sex: 10/06/1922 (84 y.o. Female) Treating RN: Cornell Barman Primary Care Duriel Deery: [name] Other Clinician: Referring Nikkie Liming: [name] Treating Gearld Kerstein/Extender: Tito Dine in Treatment: 0 Wound Status Wound Number: 1 Primary Trauma, Other Etiology: Wound Location: Left Lower Leg - Lateral Wound Open Wounding Event: Trauma Status: Date Acquired: 10/10/2017 Comorbid Cataracts, Anemia, Lymphedema, Weeks Of Treatment: 0 History: Hypertension, Rheumatoid Arthritis, Clustered Wound: No Osteoarthritis Photos Wound Measurements Length: (cm) 4.2 % Reduct Width: (cm) 1.3 % Reduct Depth: (cm) 0.3 Epitheli Area: (cm) 4.288 Tunneli Volume: (cm) 1.286 Undermi ion in Area: 0% ion in Volume: 0% alization: None ng: No ning: No Wound Description Full Thickness Without Exposed Support Foul Odo Classification: Structures  Slough/F Wound Margin: Distinct, outline attached Exudate Medium Amount: Exudate Type: Serous Exudate Color: amber r After Cleansing: No ibrino Yes Wound Bed Granulation Amount: None Present (0%) Exposed Structure Necrotic Amount: Large (67-100%) Fascia Exposed: No Necrotic Quality: Eschar Fat Layer (Subcutaneous Tissue) Exposed: Yes Tendon Exposed: No Muscle Exposed: No Joint Exposed: No Bone Exposed: No Teresa Meyer, Teresa L. ([id number]) Periwound Skin Texture Texture Color No Abnormalities Noted: No No Abnormalities Noted: No Callus: No Atrophie Blanche: No Crepitus: No Cyanosis: No Excoriation: No Ecchymosis: No Induration: No Erythema: No Rash: No Hemosiderin Staining: No Scarring: No Mottled: No Pallor: No Moisture Rubor: No No Abnormalities Noted: No Dry / Scaly: No Temperature / Pain Maceration: No Temperature: No Abnormality Wound Preparation Ulcer Cleansing: Rinsed/Irrigated with Saline Topical Anesthetic Applied: Other: lidocaine 4%, Treatment Notes Wound #1 (Left, Lateral Lower Leg) 1. Cleansed with: Clean wound with Normal Saline 2. Anesthetic Topical Lidocaine 4% cream to wound bed prior to debridement 4. Dressing Applied: Hydrogel Prisma Ag 5. Secondary Dressing Applied ABD Pad Notes kerlix and coban wrap with sleeve Electronic Signature(s) Signed: 11/23/2017 3:34:51 PM By: Roger Shelter Signed: 11/23/2017 4:43:24 PM By: Gretta Cool, BSN, RN, CWS, Kim RN, BSN Previous Signature: 11/23/2017 1:55:26 PM Version By: Gretta Cool, BSN, RN, CWS, Kim RN, BSN Entered By: Roger Shelter on 11/23/2017 15:23:09 Teresa Meyer ([id number]) -------------------------------------------------------------------------------- Vitals Details Patient Name: Teresa Meyer Date of Service: 11/23/2017 12:30 PM Medical Record Number: [id number] Patient Account Number: [id number] Date of Birth/Sex: Mar 18, 1923 (84 y.o. Female) Treating RN: Roger Shelter Primary Care Ziva Nunziata:  [name] Other Clinician: Referring Armetta Henri: [name] Treating Naida Escalante/Extender: Tito Dine in Treatment: 0 Vital Signs Time Taken: 12:45 Temperature (F): 98.0 Height (in): 60 Pulse (bpm): 79 Source: Stated Respiratory Rate (breaths/min): 18 Weight (lbs): 120 Blood Pressure (mmHg): 144/77 Source: Stated Reference Range: 80 - 120 mg / dl Body Mass Index (BMI): 23.4 Electronic Signature(s) Signed: 11/23/2017 3:34:51 PM By: Roger Shelter Entered By: Roger Shelter on 11/23/2017 12:45:41

## 2017-11-30 ENCOUNTER — Encounter: Payer: Medicare Other | Admitting: Internal Medicine

## 2017-11-30 DIAGNOSIS — L97221 Non-pressure chronic ulcer of left calf limited to breakdown of skin: Secondary | ICD-10-CM | POA: Diagnosis not present

## 2017-12-01 NOTE — Progress Notes (Signed)
Teresa, Meyer ([id number]) Visit Report for 11/30/2017 Arrival Information Details Patient Name: Teresa, Meyer Date of Service: 11/30/2017 3:15 PM Medical Record Number: [id number] Patient Account Number: [id number] Date of Birth/Sex: Mar 14, 1923 (84 y.o. F) Treating RN: Teresa Meyer Primary Care Teresa Meyer: Teresa Meyer Other Clinician: Referring Teresa Meyer: Teresa Meyer Treating Kavir Savoca/Extender: Teresa Meyer in Treatment: 1 Visit Information History Since Last Visit Added or deleted any medications: No Patient Arrived: Ambulatory Any new allergies or adverse reactions: No Arrival Time: 15:29 Had a fall or experienced change in No Accompanied By: nephew activities of daily living that may affect Transfer Assistance: None risk of falls: Patient Identification Verified: Yes Signs or symptoms of abuse/neglect since last visito No Secondary Verification Process Completed: Yes Hospitalized since last visit: No Implantable device outside of the clinic excluding No cellular tissue based products placed in the center since last visit: Has Dressing in Place as Prescribed: Yes Pain Present Now: No Electronic Signature(s) Signed: 11/30/2017 5:04:11 PM By: Teresa Meyer Entered By: Teresa Meyer on 11/30/2017 15:30:40 Teresa Meyer ([id number]) -------------------------------------------------------------------------------- Lower Extremity Assessment Details Patient Name: Teresa Meyer Date of Service: 11/30/2017 3:15 PM Medical Record Number: [id number] Patient Account Number: [id number] Date of Birth/Sex: 30-Apr-1923 (84 y.o. F) Treating RN: Teresa Meyer Primary Care Teresa Meyer: Teresa Meyer Other Clinician: Referring Teresa Meyer: Teresa Meyer Treating Teresa Meyer/Extender: Teresa Meyer in Treatment: 1 Edema Assessment Assessed: [Left: No] [Right: No] [Left: Edema] [Right: :] Calf Left: Right: Point of Measurement: 30 cm From Medial Instep 28.5 cm  cm Ankle Left: Right: Point of Measurement: 10 cm From Medial Instep 18 cm cm Vascular Assessment Claudication: Claudication Assessment [Left:None] Pulses: Dorsalis Pedis Palpable: [Left:Yes] Posterior Tibial Extremity colors, hair growth, and conditions: Extremity Color: [Left:Hyperpigmented] Hair Growth on Extremity: [Left:No] Temperature of Extremity: [Left:Warm] Capillary Refill: [Left:< 3 seconds] Toe Nail Assessment Left: Right: Thick: No Discolored: No Deformed: No Improper Length and Hygiene: No Electronic Signature(s) Signed: 11/30/2017 5:04:11 PM By: Teresa Meyer Entered By: Teresa Meyer on 11/30/2017 15:43:32 Teresa Meyer ([id number]) -------------------------------------------------------------------------------- Multi Wound Chart Details Patient Name: Teresa Meyer Date of Service: 11/30/2017 3:15 PM Medical Record Number: [id number] Patient Account Number: [id number] Date of Birth/Sex: [birth year] (84 y.o. F) Treating RN: Teresa Meyer Primary Care Teresa Meyer: Teresa Meyer Other Clinician: Referring Teresa Meyer: Teresa Meyer Treating Teresa Meyer/Extender: Teresa Meyer in Treatment: 1 Vital Signs Height(in): 60 Pulse(bpm): 8 Weight(lbs): 120 Blood Pressure(mmHg): 139/65 Body Mass Index(BMI): 23 Temperature(F): 97.9 Respiratory Rate 18 (breaths/min): Photos: [N/A:N/A] Wound Location: Left Lower Leg - Lateral N/A N/A Wounding Event: Trauma N/A N/A Primary Etiology: Trauma, Other N/A N/A Comorbid History: Cataracts, Anemia, N/A N/A Lymphedema, Hypertension, Rheumatoid Arthritis, Osteoarthritis Date Acquired: 10/10/2017 N/A N/A Weeks of Treatment: 1 N/A N/A Wound Status: Open N/A N/A Measurements L x W x D 4.2x1.3x0.2 N/A N/A (cm) Area (cm) : 4.288 N/A N/A Volume (cm) : 0.858 N/A N/A % Reduction in Area: 0.00% N/A N/A % Reduction in Volume: 33.30% N/A N/A Classification: Full Thickness Without N/A N/A Exposed Support  Structures Exudate Amount: Large N/A N/A Exudate Type: Serosanguineous N/A N/A Exudate Color: red, brown N/A N/A Wound Margin: Distinct, outline attached N/A N/A Granulation Amount: Large (67-100%) N/A N/A Granulation Quality: Red N/A N/A Necrotic Amount: None Present (0%) N/A N/A Exposed Structures: Fat Layer (Subcutaneous N/A N/A Tissue) Exposed: Yes Fascia: No Tendon: No Meyer, Teresa L. ([id number]) Muscle: No Joint: No Bone: No Epithelialization: None N/A N/A Periwound Skin Texture: Excoriation: No N/A N/A Induration: No Callus:  No Crepitus: No Rash: No Scarring: No Periwound Skin Moisture: Maceration: No N/A N/A Dry/Scaly: No Periwound Skin Color: Atrophie Blanche: No N/A N/A Cyanosis: No Ecchymosis: No Erythema: No Hemosiderin Staining: No Mottled: No Pallor: No Rubor: No Temperature: No Abnormality N/A N/A Tenderness on Palpation: No N/A N/A Wound Preparation: Ulcer Cleansing: N/A N/A Rinsed/Irrigated with Saline Topical Anesthetic Applied: Other: lidocaine 4% Treatment Notes Electronic Signature(s) Signed: 11/30/2017 5:04:11 PM By: Teresa Meyer Entered By: Teresa Meyer on 11/30/2017 15:51:37 Teresa Meyer ([id number]) -------------------------------------------------------------------------------- Hydro Details Patient Name: Teresa Meyer Date of Service: 11/30/2017 3:15 PM Medical Record Number: [id number] Patient Account Number: [id number] Date of Birth/Sex: [birth year] (84 y.o. F) Treating RN: Teresa Meyer Primary Care Wilmot Quevedo: Teresa Meyer Other Clinician: Referring Jaicee Meyer: Teresa Meyer Treating Teresa Meyer/Extender: Teresa Meyer in Treatment: 1 Active Inactive ` Necrotic Tissue Nursing Diagnoses: Impaired tissue integrity related to necrotic/devitalized tissue Goals: Necrotic/devitalized tissue will be minimized in the wound bed Date Initiated: 11/23/2017 Target Resolution Date: 12/24/2017 Goal Status:  Active Interventions: Provide education on necrotic tissue and debridement process Treatment Activities: Apply topical anesthetic as ordered : 11/23/2017 Notes: ` Orientation to the Wound Care Program Nursing Diagnoses: Knowledge deficit related to the wound healing center program Goals: Patient/caregiver will verbalize understanding of the [facility] Program Date Initiated: 11/23/2017 Target Resolution Date: 12/24/2017 Goal Status: Active Interventions: Provide education on orientation to the wound center Notes: ` Venous Leg Ulcer Nursing Diagnoses: Actual venous Insuffiency (use after diagnosis is confirmed) Goals: Patient will maintain optimal edema control Date Initiated: 11/23/2017 Target Resolution Date: 12/24/2017 JERILEE, SPACE ([id number]) Goal Status: Active Interventions: Assess peripheral edema status every visit. Treatment Activities: Therapeutic compression applied : 11/23/2017 Notes: ` Wound/Skin Impairment Nursing Diagnoses: Impaired tissue integrity Goals: Ulcer/skin breakdown will have a volume reduction of 30% by week 4 Date Initiated: 11/23/2017 Target Resolution Date: 12/24/2017 Goal Status: Active Interventions: Assess ulceration(s) every visit Treatment Activities: Topical wound management initiated : 11/23/2017 Notes: Electronic Signature(s) Signed: 11/30/2017 5:04:11 PM By: Teresa Meyer Entered By: Teresa Meyer on 11/30/2017 15:51:25 Teresa Meyer ([id number]) -------------------------------------------------------------------------------- Pain Assessment Details Patient Name: Teresa Meyer Date of Service: 11/30/2017 3:15 PM Medical Record Number: [id number] Patient Account Number: [id number] Date of Birth/Sex: 10/27/1922 (84 y.o. F) Treating RN: Teresa Meyer Primary Care Raechell Singleton: Teresa Meyer Other Clinician: Referring Daking Westervelt: Teresa Meyer Treating Berton Butrick/Extender: Teresa Meyer in Treatment: 1 Active Problems Location  of Pain Severity and Description of Pain Patient Has Paino No Site Locations Pain Management and Medication Current Pain Management: Goals for Pain Management Topical or injectable lidocaine is offered to patient for acute pain when surgical debridement is performed. If needed, Patient is instructed to use over the counter pain medication for the following 24-48 hours after debridement. Wound care MDs do not prescribed pain medications. Patient has chronic pain or uncontrolled pain. Patient has been instructed to make an appointment with their Primary Care Physician for pain management. Electronic Signature(s) Signed: 11/30/2017 5:04:11 PM By: Teresa Meyer Entered By: Teresa Meyer on 11/30/2017 15:31:28 Teresa Meyer ([id number]) -------------------------------------------------------------------------------- Wound Assessment Details Patient Name: Teresa Meyer Date of Service: 11/30/2017 3:15 PM Medical Record Number: [id number] Patient Account Number: [id number] Date of Birth/Sex: 1922-10-31 (84 y.o. F) Treating RN: Teresa Meyer Primary Care Pavel Gadd: Teresa Meyer Other Clinician: Referring Jadie Allington: Teresa Meyer Treating Pier Laux/Extender: Teresa Meyer in Treatment: 1 Wound Status Wound Number: 1 Primary Trauma, Other Etiology: Wound Location: Left Lower Leg - Lateral Wound Open  Wounding Event: Trauma Status: Date Acquired: 10/10/2017 Comorbid Cataracts, Anemia, Lymphedema, Weeks Of Treatment: 1 History: Hypertension, Rheumatoid Arthritis, Clustered Wound: No Osteoarthritis Photos Photo Uploaded By: Teresa Meyer on 11/30/2017 15:46:06 Wound Measurements Length: (cm) 4.2 Width: (cm) 1.3 Depth: (cm) 0.2 Area: (cm) 4.288 Volume: (cm) 0.858 % Reduction in Area: 0% % Reduction in Volume: 33.3% Epithelialization: None Tunneling: No Undermining: No Wound Description Full Thickness Without Exposed Support Classification: Structures Wound Margin: Distinct,  outline attached Exudate Large Amount: Exudate Type: Serosanguineous Exudate Color: red, brown Foul Odor After Cleansing: No Slough/Fibrino No Wound Bed Granulation Amount: Large (67-100%) Exposed Structure Granulation Quality: Red Fascia Exposed: No Necrotic Amount: None Present (0%) Fat Layer (Subcutaneous Tissue) Exposed: Yes Tendon Exposed: No Muscle Exposed: No Joint Exposed: No Bone Exposed: No Leazer, Hanley L. ([id number]) Periwound Skin Texture Texture Color No Abnormalities Noted: No No Abnormalities Noted: No Callus: No Atrophie Blanche: No Crepitus: No Cyanosis: No Excoriation: No Ecchymosis: No Induration: No Erythema: No Rash: No Hemosiderin Staining: No Scarring: No Mottled: No Pallor: No Moisture Rubor: No No Abnormalities Noted: No Dry / Scaly: No Temperature / Pain Maceration: No Temperature: No Abnormality Wound Preparation Ulcer Cleansing: Rinsed/Irrigated with Saline Topical Anesthetic Applied: Other: lidocaine 4%, Electronic Signature(s) Signed: 11/30/2017 5:04:11 PM By: Teresa Meyer Entered By: Teresa Meyer on 11/30/2017 15:41:26 Teresa Meyer ([id number]) -------------------------------------------------------------------------------- Vitals Details Patient Name: Teresa Meyer Date of Service: 11/30/2017 3:15 PM Medical Record Number: [id number] Patient Account Number: [id number] Date of Birth/Sex: 12/04/1922 (84 y.o. F) Treating RN: Teresa Meyer Primary Care Marvelous Bouwens: Teresa Meyer Other Clinician: Referring Cathaleen Korol: Teresa Meyer Treating Avneet Ashmore/Extender: Teresa Meyer in Treatment: 1 Vital Signs Time Taken: 15:30 Temperature (F): 97.9 Height (in): 60 Pulse (bpm): 76 Weight (lbs): 120 Respiratory Rate (breaths/min): 18 Body Mass Index (BMI): 23.4 Blood Pressure (mmHg): 139/65 Reference Range: 80 - 120 mg / dl Electronic Signature(s) Signed: 11/30/2017 5:04:11 PM By: Teresa Meyer Entered By: Teresa Meyer on  11/30/2017 15:31:59

## 2017-12-01 NOTE — Progress Notes (Signed)
ZIAN, MOHAMED ([id number]) Visit Report for 11/30/2017 HPI Details Patient Name: Teresa Meyer, Teresa Meyer Date of Service: 11/30/2017 3:15 PM Medical Record Number: [id number] Patient Account Number: [id number] Date of Birth/Sex: [birth year] (84 y.o. F) Treating RN: Roger Shelter Primary Care Provider: [name] Other Clinician: Referring Provider: [name] Treating Provider/Extender: Tito Dine in Treatment: 1 History of Present Illness HPI Description: ADMISSION 11/23/17 This is a 84 year old woman who lives in the independent [facility] of the [facility] [place] [facility]. She tells [place] and on May 20 she had a fall and suffered a hematoma on the left anterior leg. This eventually opened. She has essentially been left with 50% of this fully epithelialized but 50% of it nonhealing. She has been using steroid cream. She tells me she has worn compression stockings in the past but has not worn this since her injury ABIs in this clinic were 0.82 on the right 0.89 on the left The patient has a history of gastroesophageal reflux disease, soft visual stricture, hypertension melanoma excisions in 2015 and 2017 and chronic venous insufficiency 11/30/17; left anterior leg wound is smaller wound bed looks healthier. Small amount of advancing epithelialization.using silver collagen as the primary dressing Electronic Signature(s) Signed: 11/30/2017 6:13:38 PM By: Linton Ham MD Entered By: Linton Ham on 11/30/2017 17:11:46 Teresa Meyer ([id number]) -------------------------------------------------------------------------------- Physical Exam Details Patient Name: Teresa Meyer Date of Service: 11/30/2017 3:15 PM Medical Record Number: [id number] Patient Account Number: [id number] Date of Birth/Sex: 1923-05-03 (84 y.o. F) Treating RN: Roger Shelter Primary Care Provider: [name] Other Clinician: Referring Provider: [name] Treating  Provider/Extender: Ricard Dillon Weeks in Treatment: 1 Constitutional Sitting or standing Blood Pressure is within target range for patient.. Pulse regular and within target range for patient.Marland Kitchen Respirations regular, non-labored and within target range.. Temperature is normal and within the target range for the patient.Marland Kitchen appears in no distress. Eyes Conjunctivae clear. No discharge. Respiratory Respiratory effort is easy and symmetric bilaterally. Rate is normal at rest and on room air.. Cardiovascular Pedal pulsespalpable. Lymphatic none palpable in the popliteal or inguinal area. Integumentary (Hair, Skin) changes of chronic venous insufficiency. Notes when exam; the patient's areas on the left calf just lateral to the tibia. This did not require debridement this week. Healthy- looking wound bed with some epithelialization from the medial aspect. Electronic Signature(s) Signed: 11/30/2017 6:13:38 PM By: Linton Ham MD Entered By: Linton Ham on 11/30/2017 17:13:22 Teresa Meyer ([id number]) -------------------------------------------------------------------------------- Physician Orders Details Patient Name: Teresa Meyer Date of Service: 11/30/2017 3:15 PM Medical Record Number: [id number] Patient Account Number: [id number] Date of Birth/Sex: August 09, 1922 (84 y.o. F) Treating RN: Secundino Ginger Primary Care Provider: [name] Other Clinician: Referring Provider: [name] Treating Provider/Extender: Tito Dine in Treatment: 1 Verbal / Phone Orders: No Diagnosis Coding Wound Cleansing Wound #1 Left,Lateral Lower Leg o Cleanse wound with mild soap and water Anesthetic (add to Medication List) Wound #1 Left,Lateral Lower Leg o Topical Lidocaine 4% cream applied to wound bed prior to debridement (In Clinic Only). o Benzocaine Topical Anesthetic Spray applied to wound bed prior to debridement (In Clinic Only). Skin Barriers/Peri-Wound  Care o Moisturizing lotion Primary Wound Dressing Wound #1 Left,Lateral Lower Leg o Silver Collagen - moistened with hydrogel ([place] Jelly) Secondary Dressing Wound #1 Left,Lateral Lower Leg o ABD pad Dressing Change Frequency Wound #1 Left,Lateral Lower Leg o Change Dressing Monday, Wednesday, Friday Follow-up Appointments Wound #1 Left,Lateral Lower Leg o Return Appointment in 2 weeks. Edema Control   o Kerlix and Coban - Left Lower Extremity - From base of toes to 3cm below bend of knee. Electronic Signature(s) Signed: 11/30/2017 5:04:11 PM By: Secundino Ginger Signed: 11/30/2017 6:13:38 PM By: Linton Ham MD Entered By: Secundino Ginger on 11/30/2017 15:54:09 Teresa Meyer ([id number]) -------------------------------------------------------------------------------- Problem List Details Patient Name: Teresa Meyer Date of Service: 11/30/2017 3:15 PM Medical Record Number: [id number] Patient Account Number: [id number] Date of Birth/Sex: 03/07/1923 (84 y.o. F) Treating RN: Roger Shelter Primary Care Provider: [name] Other Clinician: Referring Provider: [name] Treating Provider/Extender: Tito Dine in Treatment: 1 Active Problems ICD-10 Evaluated Encounter Code Description Active Date Today Diagnosis S81.812D Laceration without foreign body, left lower leg, subsequent 11/23/2017 Yes Yes encounter Status Complications Interventions Improving slightly smaller wound bed is Medical healthy looking no Decision debridement Making : required. We're using silver collagen L97.221 Non-pressure chronic ulcer of left calf limited to breakdown 11/23/2017 Yes Yes of skin Status Complications Interventions Medical Improving smaller with a better looking surface silver collagen Decision Making : I87.332 Chronic venous hypertension (idiopathic) with ulcer and 11/23/2017 Yes Yes inflammation of left lower extremity Status Complications  Interventions Other patient has underlying chronic venous changes with severe I elected to use skin damage. This was a laceration injury however she is at [employer] risk for further skin breakdown. There is some swelling. however I think if Decision she requires 3 alert Making : compression she would tolerate this. Inactive Problems Resolved Problems Electronic Signature(s) Signed: 11/30/2017 6:13:38 PM By: Linton Ham MD Entered By: Linton Ham on 11/30/2017 16:43:34 Teresa Meyer ([id number]) -------------------------------------------------------------------------------- Progress Note Details Patient Name: Teresa Meyer Date of Service: 11/30/2017 3:15 PM Medical Record Number: [id number] Patient Account Number: [id number] Date of Birth/Sex: 03-Oct-1922 (84 y.o. F) Treating RN: Roger Shelter Primary Care Provider: [name] Other Clinician: Referring Provider: [name] Treating Provider/Extender: Tito Dine in Treatment: 1 Subjective History of Present Illness (HPI) ADMISSION 11/23/17 This is a 84 year old woman who lives in the independent part of the [facility] [place] [facility]. She tells [place] and on May 20 she had a fall and suffered a hematoma on the left anterior leg. This eventually opened. She has essentially been left with 50% of this fully epithelialized but 50% of it nonhealing. She has been using steroid cream. She tells me she has worn compression stockings in the past but has not worn this since her injury ABIs in this clinic were 0.82 on the right 0.89 on the left The patient has a history of gastroesophageal reflux disease, soft visual stricture, hypertension melanoma excisions in 2015 and 2017 and chronic venous insufficiency 11/30/17; left anterior leg wound is smaller wound bed looks healthier. Small amount of advancing epithelialization.using silver collagen as the primary  dressing Objective Constitutional Sitting or standing Blood Pressure is within target range for patient.. Pulse regular and within target range for patient.Marland Kitchen Respirations regular, non-labored and within target range.. Temperature is normal and within the target range for the patient.Marland Kitchen appears in no distress. Vitals Time Taken: 3:30 PM, Height: 60 in, Weight: 120 lbs, BMI: 23.4, Temperature: 97.9 F, Pulse: 76 bpm, Respiratory Rate: 18 breaths/min, Blood Pressure: 139/65 mmHg. Eyes Conjunctivae clear. No discharge. Respiratory Respiratory effort is easy and symmetric bilaterally. Rate is normal at rest and on room air.. Cardiovascular Pedal pulsespalpable. Lymphatic none palpable in the popliteal or inguinal area. Teresa Meyer, Teresa Meyer Kitchen ([id number]) General Notes: when exam; the patient's areas on the left calf just lateral to the  tibia. This did not require debridement this week. Healthy-looking wound bed with some epithelialization from the medial aspect. Integumentary (Hair, Skin) changes of chronic venous insufficiency. Wound #1 status is Open. Original cause of wound was Trauma. The wound is located on the Left,Lateral Lower Leg. The wound measures 4.2cm length x 1.3cm width x 0.2cm depth; 4.288cm^2 area and 0.858cm^3 volume. There is Fat Layer (Subcutaneous Tissue) Exposed exposed. There is no tunneling or undermining noted. There is a large amount of serosanguineous drainage noted. The wound margin is distinct with the outline attached to the wound base. There is large (67-100%) red granulation within the wound bed. There is no necrotic tissue within the wound bed. The periwound skin appearance did not exhibit: Callus, Crepitus, Excoriation, Induration, Rash, Scarring, Dry/Scaly, Maceration, Atrophie Blanche, Cyanosis, Ecchymosis, Hemosiderin Staining, Mottled, Pallor, Rubor, Erythema. Periwound temperature was noted as No Abnormality. Assessment Active Problems ICD-10 Laceration  without foreign body, left lower leg, subsequent encounter Non-pressure chronic ulcer of left calf limited to breakdown of skin Chronic venous hypertension (idiopathic) with ulcer and inflammation of left lower extremity Plan Wound Cleansing: Wound #1 Left,Lateral Lower Leg: Cleanse wound with mild soap and water Anesthetic (add to Medication List): Wound #1 Left,Lateral Lower Leg: Topical Lidocaine 4% cream applied to wound bed prior to debridement (In Clinic Only). Benzocaine Topical Anesthetic Spray applied to wound bed prior to debridement (In Clinic Only). Skin Barriers/Peri-Wound Care: Moisturizing lotion Primary Wound Dressing: Wound #1 Left,Lateral Lower Leg: Silver Collagen - moistened with hydrogel ([place] Jelly) Secondary Dressing: Wound #1 Left,Lateral Lower Leg: ABD pad Dressing Change Frequency: Wound #1 Left,Lateral Lower Leg: Change Dressing Monday, Wednesday, Friday Follow-up Appointments: Wound #1 Left,Lateral Lower Leg: Return Appointment in 2 weeks. Edema Control: TALI, CLEAVES ([id number]) Kerlix and Coban - Left Lower Extremity - From base of toes to 3cm below bend of knee. Medical Decision Making Laceration without foreign body, left lower leg, subsequent encounter 11/23/2017 Status: Improving Complications: slightly smaller Interventions: wound bed is healthy looking no debridement required. We're using silver collagen Non-pressure chronic ulcer of left calf limited to breakdown of skin 11/23/2017 Status: Improving Complications: smaller with a better looking surface Interventions: silver collagen Chronic venous hypertension (idiopathic) with ulcer and inflammation of left lower extremity 11/23/2017 Status: Other Complications: patient has underlying chronic venous changes with severe skin damage. This was a laceration injury however she is at risk for further skin breakdown. There is some swelling. Interventions: I elected to use Kerlix Coban however I think  if she requires 3 alert compression she would tolerate this. #1 Silver collagen moistened with hydrogel/ABDs/Kerlix and Coban. She is getting this changed at the nurses station at [facility]) Signed: 11/30/2017 6:13:38 PM By: Linton Ham MD Entered By: Linton Ham on 11/30/2017 17:14:16 Teresa Meyer ([id number]) -------------------------------------------------------------------------------- [place] Details Patient Name: Teresa Meyer Date of Service: 11/30/2017 Medical Record Number: [id number] Patient Account Number: [id number] Date of Birth/Sex: [birth year] (84 y.o. F) Treating RN: Roger Shelter Primary Care Provider: [name] Other Clinician: Referring Provider: [name] Treating Provider/Extender: Tito Dine in Treatment: 1 Diagnosis Coding ICD-10 Codes Code Description [phone] Laceration without foreign body, left lower leg, subsequent encounter L97.221 Non-pressure chronic ulcer of left calf limited to breakdown of skin I87.332 Chronic venous hypertension (idiopathic) with ulcer and inflammation of left lower extremity Physician Procedures CPT4 Code: [id number] Description: 99213 - WC PHYS LEVEL 3 - EST PT ICD-10 Diagnosis Description S81.812D Laceration without foreign body, left lower leg, subsequent L97.221  Non-pressure chronic ulcer of left calf limited to breakdow Modifier: encounter n of skin Quantity: 1 Electronic Signature(s) Signed: 11/30/2017 6:13:38 PM By: Linton Ham MD Entered By: Linton Ham on 11/30/2017 17:15:05

## 2017-12-12 ENCOUNTER — Inpatient Hospital Stay: Payer: Medicare Other | Attending: Hematology and Oncology

## 2017-12-12 ENCOUNTER — Other Ambulatory Visit: Payer: Self-pay

## 2017-12-12 DIAGNOSIS — K2980 Duodenitis without bleeding: Secondary | ICD-10-CM | POA: Diagnosis not present

## 2017-12-12 DIAGNOSIS — I10 Essential (primary) hypertension: Secondary | ICD-10-CM | POA: Insufficient documentation

## 2017-12-12 DIAGNOSIS — K219 Gastro-esophageal reflux disease without esophagitis: Secondary | ICD-10-CM | POA: Insufficient documentation

## 2017-12-12 DIAGNOSIS — E871 Hypo-osmolality and hyponatremia: Secondary | ICD-10-CM | POA: Diagnosis not present

## 2017-12-12 DIAGNOSIS — D509 Iron deficiency anemia, unspecified: Secondary | ICD-10-CM

## 2017-12-12 DIAGNOSIS — K222 Esophageal obstruction: Secondary | ICD-10-CM | POA: Insufficient documentation

## 2017-12-12 DIAGNOSIS — E785 Hyperlipidemia, unspecified: Secondary | ICD-10-CM | POA: Diagnosis not present

## 2017-12-12 DIAGNOSIS — Z79899 Other long term (current) drug therapy: Secondary | ICD-10-CM | POA: Insufficient documentation

## 2017-12-12 LAB — CBC WITH DIFFERENTIAL/PLATELET
Basophils Absolute: 0 10*3/uL (ref 0–0.1)
Basophils Relative: 1 %
Eosinophils Absolute: 0.2 10*3/uL (ref 0–0.7)
Eosinophils Relative: 3 %
HCT: 33.4 % — ABNORMAL LOW (ref 35.0–47.0)
Hemoglobin: 11.1 g/dL — ABNORMAL LOW (ref 12.0–16.0)
Lymphocytes Relative: 21 %
Lymphs Abs: 1.1 10*3/uL (ref 1.0–3.6)
MCH: 30.3 pg (ref 26.0–34.0)
MCHC: 33.1 g/dL (ref 32.0–36.0)
MCV: 91.5 fL (ref 80.0–100.0)
Monocytes Absolute: 0.5 10*3/uL (ref 0.2–0.9)
Monocytes Relative: 10 %
Neutro Abs: 3.3 10*3/uL (ref 1.4–6.5)
Neutrophils Relative %: 65 %
Platelets: 260 10*3/uL (ref 150–440)
RBC: 3.65 MIL/uL — ABNORMAL LOW (ref 3.80–5.20)
RDW: 19 % — ABNORMAL HIGH (ref 11.5–14.5)
WBC: 5.1 10*3/uL (ref 3.6–11.0)

## 2017-12-12 LAB — SEDIMENTATION RATE: Sed Rate: 27 mm/hr (ref 0–30)

## 2017-12-12 LAB — FERRITIN: Ferritin: 24 ng/mL (ref 11–307)

## 2017-12-13 ENCOUNTER — Inpatient Hospital Stay (HOSPITAL_BASED_OUTPATIENT_CLINIC_OR_DEPARTMENT_OTHER): Payer: Medicare Other | Admitting: Urgent Care

## 2017-12-13 ENCOUNTER — Encounter: Payer: Self-pay | Admitting: Urgent Care

## 2017-12-13 ENCOUNTER — Telehealth: Payer: Self-pay | Admitting: *Deleted

## 2017-12-13 ENCOUNTER — Inpatient Hospital Stay: Payer: Medicare Other

## 2017-12-13 ENCOUNTER — Other Ambulatory Visit: Payer: Self-pay

## 2017-12-13 VITALS — BP 167/79 | HR 69 | Temp 97.4°F | Ht 60.0 in | Wt 120.9 lb

## 2017-12-13 DIAGNOSIS — D509 Iron deficiency anemia, unspecified: Secondary | ICD-10-CM

## 2017-12-13 DIAGNOSIS — D692 Other nonthrombocytopenic purpura: Secondary | ICD-10-CM

## 2017-12-13 MED ORDER — VITAMIN C 250 MG PO TABS: 250.0000 mg | ORAL_TABLET | Freq: Every day | ORAL | 0 refills | Status: DC

## 2017-12-13 NOTE — Progress Notes (Signed)
Patient here for follow up. She has no complaints today.  

## 2017-12-13 NOTE — Progress Notes (Signed)
[facility] day:  12/13/2017  Chief Complaint: Teresa Meyer is a 84 y.o. female with severe anemia who is seen for 1 month assessment on oral iron.   HPI:  The patient was last seen in the hematology clinic on 11/11/2017.  At that time, patient was feeling well.  She had a nonhealing wound to her lower leg, and was scheduled to follow-up with her PCP later that day.  Patient was eating well and making efforts to maintain an iron rich diet.  She was on supplemental iron daily.  Exam was stable.  Hemoglobin 10.2.  Patient was seen by PCP on 11/12/2017.  Notes reviewed.  Patient with a poorly healing wound to her left lower extremity x4 weeks.  Patient subsequently referred to wound care clinic for further evaluation and probable debridement.  Patient to follow-up with PCP for worsening symptoms.  Patient was evaluated in the wound care clinic on 11/23/2017 by Dr. Linton Ham.  Notes reviewed.  Patient with nonhealing wound and underlying chronic venous stasis changes.  Wound was debrided, and new wound care regimen was started using silver collagen moistened with hydrogel.  Patient will continue with compression dressings.  She is scheduled to follow-up in 1 week with wound care center.  CBC on 12/12/2017 revealed a WBC of  5100 with an [place] of 3300.  Hemoglobin 11.1, hematocrit 33.4, MCV 91.5, platelets 260,000.  Ferritin low at 24 (previously 54).  Sed rate has normalized to 27 (previously 45).  In the interim, patient has been doing well overall.  She notes that her energy level is "fine" and that she feels "normal". Patient denies bleeding; no hematochezia, melena, or gross hematuria.  Patient has areas of bruising to her hands and bilateral upper forearms.  She notes that she can bump her arm ever so slightly and it will bruise.  Patient denies that she has experienced any B symptoms. She denies any interval infections. Patient advises that she maintains  an adequate appetite. She is eating well. Weight today is 120 lb 14.4 oz (54.8 kg), which compared to her last visit to the clinic, represents a 1 pound increase.  It was discovered that patient is not taking her oral iron supplement as prescribed.  She has only been taking ferrous sulfate 325 mg on Mondays and Thursdays despite conversations at last visit advising her to increase supplementation to daily.  Additionally, patient is not taking her iron supplement with a source of vitamin C.  She notes that she is unable to tolerate orange juice because of significant acid reflux.  Patient denies pain in the clinic today.   Past Medical History:  Diagnosis Date  . Arthritis   . Atypical nevi 09/2014   lentiginous dysplastic nevus with mod atypia ([place])  . Diverticulitis   . DNR (do not resuscitate)   . Esophageal stricture 2012  . GERD (gastroesophageal reflux disease)   . HLD (hyperlipidemia)    did not tolerate statin, stopped checking  . HTN (hypertension)   . Malignant melanoma ([place]) 2015, 2017   R chin s/p excision ([place]) R upper back [name])  . Osteoporosis, post-menopausal    bisphosphonate caused dysphagia, requests defer DEXA for now  . Seasonal allergies   . Wears hearing aid     Past Surgical History:  Procedure Laterality Date  . BUNIONECTOMY Right 2001  . CARPAL TUNNEL RELEASE Bilateral 1974  . CATARACT EXTRACTION Bilateral    [place]  . TONSILLECTOMY  1928    Family History  Problem Relation Age of Onset  . Pancreatic cancer Son   . Breast cancer Sister   . Lung cancer Mother   . Diabetes Other        nephew  . Stroke Neg Hx   . CAD Neg Hx     Social History:  reports that she has never smoked. She has never used smokeless tobacco. She reports that she drinks alcohol. She reports that she does not use drugs.  She worked at a travel agency for 25 years.  She lives at [facility].  The patient is accompanied by a her nephew, [name], and his  wife today.  Allergies:  Allergies  Allergen Reactions  . Bisphosphonates Other (See Comments)    Dysphagia to oral bisphosphonates  . Codeine   . Statins Other (See Comments)    Muscle aches  . Sulfa Antibiotics     Can't recall reaction  . Procaine Hcl Palpitations    Current Medications: Current Outpatient Medications  Medication Sig Dispense Refill  . calcium carbonate (TUMS - DOSED IN MG ELEMENTAL CALCIUM) 500 MG chewable tablet Chew 1 tablet by mouth 2 (two) times daily with a meal.    . cholecalciferol (VITAMIN D) 1000 UNITS tablet Take 1,000 Units by mouth daily.    . collagenase (SANTYL) ointment Apply 1 application topically daily. 30 g 0  . diphenhydramine-acetaminophen (TYLENOL PM) 25-500 MG TABS Take 2 tablets by mouth at bedtime as needed.    . ferrous sulfate 325 (65 FE) MG tablet Take 1 tablet (325 mg total) by mouth once a week. (Patient taking differently: Take 325 mg by mouth daily with breakfast. )    . lisinopril (PRINIVIL,ZESTRIL) 10 MG tablet Take 1 tablet (10 mg total) by mouth daily. 30 tablet 3  . prednisoLONE acetate (PRED MILD) 0.12 % ophthalmic suspension Place 1 drop into the left eye daily.     . vitamin B-12 (CYANOCOBALAMIN) 1000 MCG tablet Take 1 tablet (1,000 mcg total) by mouth 2 (two) times a week.     No current facility-administered medications for this visit.     Review of Systems  Constitutional: Negative for diaphoresis, fever, malaise/fatigue (energy level is "normal") and weight loss (weight up 1 pound).       "I am doing fine.  I feel normal".  HENT: Positive for hearing loss (Hearing aids). Negative for nosebleeds and sore throat.   Eyes: Negative for pain and redness.       Glasses since the age of 4.  Previous cataract surgery on the left eye.  Respiratory: Negative for cough, hemoptysis, sputum production and shortness of breath.   Cardiovascular: Negative for chest pain, palpitations, orthopnea, leg swelling and PND.   Gastrointestinal: Positive for heartburn (Uses calcium carbonate tablets). Negative for abdominal pain, blood in stool, constipation, diarrhea, melena, nausea and vomiting.  Genitourinary: Negative for dysuria, frequency, hematuria and urgency.  Musculoskeletal: Positive for falls and joint pain (Bilateral knees). Negative for back pain and myalgias.  Skin: Negative for itching and rash.       Venous stasis changes to her bilateral lower extremities.   slow healing wound to left lower extremity.  Followed by the wound care center.  Neurological: Negative for dizziness, tremors, weakness (Generalized-requires the assistance of a cane for ambulation) and headaches.  Endo/Heme/Allergies: Does not bruise/bleed easily.  Psychiatric/Behavioral: Negative for depression, memory loss and suicidal ideas. The patient is not nervous/anxious and does not have insomnia.  All other systems reviewed and are negative.  Performance status (ECOG): 2 - Symptomatic, <50% confined to bed  Vital Signs BP (!) 167/79 (BP Location: Right Wrist, Patient Position: Sitting)   Pulse 69   Temp (!) 97.4 F (36.3 C) (Tympanic)   Ht 5' (1.524 m)   Wt 120 lb 14.4 oz (54.8 kg)   BMI 23.61 kg/m   Physical Exam  Constitutional: She is oriented to person, place, and time and well-developed, well-nourished, and in no distress.  HENT:  Head: Normocephalic and atraumatic.  Eyes: Pupils are equal, round, and reactive to light. EOM are normal. No scleral icterus.  Neck: Normal range of motion. Neck supple. No tracheal deviation present. No thyromegaly present.  Cardiovascular: Normal rate, regular rhythm and normal heart sounds. Exam reveals no gallop and no friction rub.  No murmur heard. Pulmonary/Chest: Effort normal and breath sounds normal. No respiratory distress. She has no wheezes. She has no rales.  Abdominal: Soft. Bowel sounds are normal. She exhibits no distension. There is no tenderness.  Musculoskeletal: Normal  range of motion. She exhibits no edema or tenderness.  Neurological: She is alert and oriented to person, place, and time.  Skin: Skin is warm and dry. Bruising (senile purpura) noted. No rash noted. No erythema.  Slow healing wound to LEFT lower extremity.  Area wrapped today.  Patient is being followed by wound care center.  Psychiatric: Mood, affect and judgment normal.  Nursing note and vitals reviewed.   Orders Only on 12/12/2017  Component Date Value Ref Range Status  . Sed Rate 12/12/2017 27  0 - 30 mm/hr Final   Performed at [facility], [address] [place], [place] [address]  . Ferritin 12/12/2017 24  11 - 307 ng/mL Final   Performed at [facility], [address] [place], [place] [address]  . WBC 12/12/2017 5.1  3.6 - 11.0 K/uL Final  . RBC 12/12/2017 3.65* 3.80 - 5.20 MIL/uL Final  . Hemoglobin 12/12/2017 11.1* 12.0 - 16.0 g/dL Final  . HCT 12/12/2017 33.4* 35.0 - 47.0 % Final  . MCV 12/12/2017 91.5  80.0 - 100.0 fL Final  . MCH 12/12/2017 30.3  26.0 - 34.0 pg Final  . MCHC 12/12/2017 33.1  32.0 - 36.0 g/dL Final  . RDW 12/12/2017 19.0* 11.5 - 14.5 % Final  . Platelets 12/12/2017 260  150 - 440 K/uL Final  . Neutrophils Relative % 12/12/2017 65  % Final  . Neutro Abs 12/12/2017 3.3  1.4 - 6.5 K/uL Final  . Lymphocytes Relative 12/12/2017 21  % Final  . Lymphs Abs 12/12/2017 1.1  1.0 - 3.6 K/uL Final  . Monocytes Relative 12/12/2017 10  % Final  . Monocytes Absolute 12/12/2017 0.5  0.2 - 0.9 K/uL Final  . Eosinophils Relative 12/12/2017 3  % Final  . Eosinophils Absolute 12/12/2017 0.2  0 - 0.7 K/uL Final  . Basophils Relative 12/12/2017 1  % Final  . Basophils Absolute 12/12/2017 0.0  0 - 0.1 K/uL Final   Performed at [facility], [address], [place], [place] [address]    Assessment:  Teresa Meyer is a 84 y.o. female with iron deficiency anemia.  She has a history of chronic anemia and has been on/off oral iron.  Diet appears  good.  She denies any bleeding. She is taking oral iron (increased from 1/week to daily on 11/03/2017).  Work-up in 09/2017 included the following normal studies: B12, folate, TSH.  Ferritin was  23.3 - 49.  Iron saturation was 3% with a TIBC of 331.  Retic was 4.7%.  Creatinine was 0.89.  Work-up on 11/03/2017 revealed a hematocrit 29.9, hemoglobin 10.2, MCV 90.6, platelets 407,000, white count 5800 with an ANC of 4100.  Ferritin was 54 with an iron saturation of 7% and a TIBC of 317.  Sed rate was 45 (0-30).  Normal studies included:  [place], SPEP, free light chain ratio, urinalysis.  Ferritin was 54.  Iron saturation was 7% with a TIBC of 317.    Ferritin has been followed: 54 on 11/03/2017, and 24 on 12/12/2017.  EGD on 02/06/2009 revealed an esophageal stricture at the GE junction and duodenitis.  She underwent dilatation.  Colonoscopy on 08/04/2001 revealed sigmoid diverticulosis.  She was admitted to [facility] from 10/11/2017 - 10/14/2017 following a fall with symptomatic hyponatremia (129) and anemia (hemoglobin 6.6).  She was diagnosed with rib fractures (left 6,7,9,10).  She received 1 unit of PRBCs.  She declined GI consult.  She was started on a PPI and oral iron.   Symptomatically, patient is feeling "fine" and "normal".  She has no acute complaints today.  Patient denies bleeding.  She has a slow healing wound to her left lower extremity that is currently being followed by wound care center.  Patient is only taking oral iron twice a week.  Exam is grossly unremarkable.  WBC 5100 with an [place] of 3300.  Hemoglobin has improved to 11.1.  Platelets 260,000.  Ferritin has decreased to 24.  Plan: 1. Review labs from 12/12/2017. Hemoglobin has improved to 11.1. MCV 91.5. Ferritin low at 24 (previously 54).  Sed rate has normalized to 27 (previously 45). 2. Discuss iron stores. Ferritin low at 24 despite oral iron.  Of note, patient only taking oral iron twice a week rather than the daily prescribed  dose.  Additionally, she is not taking it with a source of vitamin C.   Discussed intravenous iron replacement.  Patient adamantly refuses iron infusions at this time.  Patient once again advised to increase her ferrous sulfate 325 mg to 1 tab daily.  Patient unable to tolerate orange juice.  Encourage OTC vitamin C tablet.  Patient requesting that prescription be sent and to make it easier on her.  Prescription sent for vitamin C 250 mg daily. 3. Discussed need to recheck labs in 6 weeks to follow-up on status of her iron deficiency.  Orders faxed to [facility] today for a CBC with differential and ferritin level to be drawn in 6 weeks and forwarded to the cancer center.   4. RTC in 3 months for MD assessment, labs (CBC with differential, ferritin-day before), and +/- Venofer.   Honor Loh, NP  12/13/2017, 7:23 AM

## 2017-12-13 NOTE — Telephone Encounter (Signed)
[facility] per patient request to ask them to refill her Lisinopril.  Patient is enroute to pick it up.

## 2017-12-14 ENCOUNTER — Encounter: Payer: Medicare Other | Admitting: Internal Medicine

## 2017-12-14 DIAGNOSIS — L97221 Non-pressure chronic ulcer of left calf limited to breakdown of skin: Secondary | ICD-10-CM | POA: Diagnosis not present

## 2017-12-15 ENCOUNTER — Other Ambulatory Visit: Payer: Self-pay | Admitting: *Deleted

## 2017-12-15 DIAGNOSIS — D509 Iron deficiency anemia, unspecified: Secondary | ICD-10-CM

## 2017-12-21 ENCOUNTER — Encounter: Payer: Medicare Other | Admitting: Internal Medicine

## 2017-12-21 DIAGNOSIS — L97221 Non-pressure chronic ulcer of left calf limited to breakdown of skin: Secondary | ICD-10-CM | POA: Diagnosis not present

## 2018-01-01 NOTE — Progress Notes (Signed)
HADLYN, AMERO ([id number]) Visit Report for 12/14/2017 Debridement Details Patient Name: Teresa Meyer, Teresa Meyer Date of Service: 12/14/2017 12:30 PM Medical Record Number: [id number] Patient Account Number: [id number] Date of Birth/Sex: 1922-08-23 (84 y.o. F) Treating RN: Cornell Barman Primary Care Provider: [name] Other Clinician: Referring Provider: [name] Treating Provider/Extender: Tito Dine in Treatment: 3 Debridement Performed for Wound #1 Left,Lateral Lower Leg Assessment: Performed By: Physician Ricard Dillon, MD Debridement Type: Debridement Pre-procedure Verification/Time Yes - 13:20 Out Taken: Start Time: 13:20 Pain Control: Other : lidocaine 4% Total Area Debrided (L x W): 1.1 (cm) x 0.7 (cm) = 0.77 (cm) Tissue and other material Viable, Non-Viable, Callus, Eschar, Slough, Subcutaneous, Slough debrided: Level: Skin/Subcutaneous Tissue Debridement Description: Excisional Instrument: Curette Bleeding: Minimum Hemostasis Achieved: Pressure Response to Treatment: Procedure was tolerated well Level of Consciousness: Awake and Alert Post Debridement Measurements of Total Wound Length: (cm) 1.2 Width: (cm) 0.7 Depth: (cm) 0.2 Volume: (cm) 0.132 Character of Wound/Ulcer Post Debridement: Stable Post Procedure Diagnosis Same as Pre-procedure Electronic Signature(s) Signed: 12/21/2017 8:05:08 AM By: Linton Ham MD Signed: 12/23/2017 6:17:48 PM By: Gretta Cool, BSN, RN, CWS, Kim RN, BSN Previous Signature: 12/20/2017 1:10:26 PM Version By: Gretta Cool, BSN, RN, CWS, Kim RN, BSN Entered By: Linton Ham on 12/20/2017 19:31:23 Teresa Meyer ([id number]) -------------------------------------------------------------------------------- HPI Details Patient Name: Teresa Meyer Date of Service: 12/14/2017 12:30 PM Medical Record Number: [id number] Patient Account Number: [id number] Date of Birth/Sex: November 12, 1922 (84 y.o. F) Treating RN: Cornell Barman Primary Care Provider: [name] Other Clinician: Referring Provider: [name] Treating Provider/Extender: Tito Dine in Treatment: 3 History of Present Illness HPI Description: ADMISSION 11/23/17 This is a 84 year old woman who lives in the independent part of the [facility] [place] [facility]. She tells [place] and on May 20 she had a fall and suffered a hematoma on the left anterior leg. This eventually opened. She has essentially been left with 50% of this fully epithelialized but 50% of it nonhealing. She has been using steroid cream. She tells me she has worn compression stockings in the past but has not worn this since her injury ABIs in this clinic were 0.82 on the right 0.89 on the left The patient has a history of gastroesophageal reflux disease, soft visual stricture, hypertension melanoma excisions in 2015 and 2017 and chronic venous insufficiency 11/30/17; left anterior leg wound is smaller wound bed looks healthier. Small amount of advancing epithelialization.using silver collagen as the primary dressing 12/14/17; patient with a wound on the left anterior leg. This continues to look smaller however not as viable surface. We've been using silver collagen as the primary dressing Electronic Signature(s) Signed: 12/21/2017 8:05:08 AM By: Linton Ham MD Entered By: Linton Ham on 12/20/2017 19:31:54 Teresa Meyer ([id number]) -------------------------------------------------------------------------------- Physical Exam Details Patient Name: Teresa Meyer Date of Service: 12/14/2017 12:30 PM Medical Record Number: [id number] Patient Account Number: [id number] Date of Birth/Sex: [birth year] (84 y.o. F) Treating RN: Cornell Barman Primary Care Provider: [name] Other Clinician: Referring Provider: [name] Treating Provider/Extender: Tito Dine in Treatment: 3 Constitutional Patient is hypertensive.. Pulse regular  and within target range for patient.Marland Kitchen Respirations regular, non-labored and within target range.. Temperature is normal and within the target range for the patient.Marland Kitchen appears in no distress. Notes wound exam; the patient's area on the left calf just lateral to the tibia. Debrided of chronic surface material. Generally post debridement a healthy-looking wound bed. Hemostasis with direct pressure. Electronic Signature(s) Signed: 12/21/2017 8:05:08  AM By: Linton Ham MD Entered By: Linton Ham on 12/20/2017 19:32:55 Teresa Meyer ([id number]) -------------------------------------------------------------------------------- Physician Orders Details Patient Name: Teresa Meyer Date of Service: 12/14/2017 12:30 PM Medical Record Number: [id number] Patient Account Number: [id number] Date of Birth/Sex: Jan 04, 1923 (84 y.o. F) Treating RN: Cornell Barman Primary Care Provider: [name] Other Clinician: Referring Provider: [name] Treating Provider/Extender: Tito Dine in Treatment: 3 Verbal / Phone Orders: No Diagnosis Coding Wound Cleansing Wound #1 Left,Lateral Lower Leg o Cleanse wound with mild soap and water Anesthetic (add to Medication List) Wound #1 Left,Lateral Lower Leg o Topical Lidocaine 4% cream applied to wound bed prior to debridement (In Clinic Only). o Benzocaine Topical Anesthetic Spray applied to wound bed prior to debridement (In Clinic Only). Skin Barriers/Peri-Wound Care o Moisturizing lotion Primary Wound Dressing Wound #1 Left,Lateral Lower Leg o Silver Collagen - moistened with hydrogel ([place] Jelly) Secondary Dressing Wound #1 Left,Lateral Lower Leg o ABD pad Dressing Change Frequency Wound #1 Left,Lateral Lower Leg o Change Dressing Monday, Wednesday, Friday Follow-up Appointments Wound #1 Left,Lateral Lower Leg o Return Appointment in 2 weeks. Edema Control o Kerlix and Coban - Left Lower Extremity - From  base of toes to 3cm below bend of knee. Do not wrap Coban too tight. Electronic Signature(s) Signed: 12/20/2017 3:24:33 PM By: Gretta Cool, BSN, RN, CWS, Kim RN, BSN Signed: 12/21/2017 8:05:08 AM By: Linton Ham MD Entered By: Gretta Cool, BSN, RN, CWS, Kim on 12/20/2017 15:24:30 Teresa Meyer ([id number]) -------------------------------------------------------------------------------- Problem List Details Patient Name: Teresa Meyer Date of Service: 12/14/2017 12:30 PM Medical Record Number: [id number] Patient Account Number: [id number] Date of Birth/Sex: 11-Jul-1922 (84 y.o. F) Treating RN: Cornell Barman Primary Care Provider: [name] Other Clinician: Referring Provider: [name] Treating Provider/Extender: Tito Dine in Treatment: 3 Active Problems ICD-10 Evaluated Encounter Code Description Active Date Today Diagnosis S81.812D Laceration without foreign body, left lower leg, subsequent 11/23/2017 Yes Yes encounter Status Complications Interventions Medical Improving slightly smaller using silver collagen Decision under Kerlix Coban Making : L97.221 Non-pressure chronic ulcer of left calf limited to breakdown 11/23/2017 Yes Yes of skin Status Complications Interventions Medical Improving smaller with a better looking surface silver collagen Decision Making : I87.332 Chronic venous hypertension (idiopathic) with ulcer and 11/23/2017 Yes Yes inflammation of left lower extremity Status Complications Interventions Medical Other patient has underlying chronic venous changes with severe continue Kerlix Decision skin damage. This was a laceration injury however she is at [employer] : risk for further skin breakdown. There is some swelling. Inactive Problems Resolved Problems Electronic Signature(s) Signed: 12/21/2017 8:05:08 AM By: Linton Ham MD Entered By: Linton Ham on 12/20/2017 19:30:35 Teresa Meyer  ([id number]) -------------------------------------------------------------------------------- Progress Note Details Patient Name: Teresa Meyer Date of Service: 12/14/2017 12:30 PM Medical Record Number: [id number] Patient Account Number: [id number] Date of Birth/Sex: Sep 10, 1922 (84 y.o. F) Treating RN: Cornell Barman Primary Care Provider: [name] Other Clinician: Referring Provider: [name] Treating Provider/Extender: Tito Dine in Treatment: 3 Subjective History of Present Illness (HPI) ADMISSION 11/23/17 This is a 84 year old woman who lives in the independent part of the [facility] [place] [facility]. She tells [place] and on May 20 she had a fall and suffered a hematoma on the left anterior leg. This eventually opened. She has essentially been left with 50% of this fully epithelialized but 50% of it nonhealing. She has been using steroid cream. She tells me she has worn compression stockings in the past but has not  worn this since her injury ABIs in this clinic were 0.82 on the right 0.89 on the left The patient has a history of gastroesophageal reflux disease, soft visual stricture, hypertension melanoma excisions in 2015 and 2017 and chronic venous insufficiency 11/30/17; left anterior leg wound is smaller wound bed looks healthier. Small amount of advancing epithelialization.using silver collagen as the primary dressing 12/14/17; patient with a wound on the left anterior leg. This continues to look smaller however not as viable surface. We've been using silver collagen as the primary dressing Objective Constitutional Patient is hypertensive.. Pulse regular and within target range for patient.Marland Kitchen Respirations regular, non-labored and within target range.. Temperature is normal and within the target range for the patient.Marland Kitchen appears in no distress. Vitals Time Taken: 12:49 PM, Height: 60 in, Weight: 120 lbs, BMI: 23.4, Temperature: 97.7 F, Pulse: 73 bpm,  Respiratory Rate: 16 breaths/min, Blood Pressure: 166/74 mmHg. General Notes: wound exam; the patient's area on the left calf just lateral to the tibia. Debrided of chronic surface material. Generally post debridement a healthy-looking wound bed. Hemostasis with direct pressure. Integumentary (Hair, Skin) Wound #1 status is Open. Original cause of wound was Trauma. The wound is located on the Left,Lateral Lower Leg. The wound measures 1.1cm length x 0.7cm width x 0.1cm depth; 0.605cm^2 area and 0.06cm^3 volume. Teresa Meyer, Teresa Meyer ([id number]) Assessment Active Problems ICD-10 Laceration without foreign body, left lower leg, subsequent encounter Non-pressure chronic ulcer of left calf limited to breakdown of skin Chronic venous hypertension (idiopathic) with ulcer and inflammation of left lower extremity Procedures Wound #1 Pre-procedure diagnosis of Wound #1 is a Trauma, Other located on the Left,Lateral Lower Leg . There was a Excisional Skin/Subcutaneous Tissue Debridement with a total area of 0.77 sq cm performed by Ricard Dillon, MD. With the following instrument(s): Curette to remove Viable and Non-Viable tissue/material. Material removed includes Eschar, Callus, Subcutaneous Tissue, and Slough after achieving pain control using Other (lidocaine 4%). No specimens were taken. A time out was conducted at 13:20, prior to the start of the procedure. A Minimum amount of bleeding was controlled with Pressure. The procedure was tolerated well. Patient s Level of Consciousness post procedure was recorded as Awake and Alert. Post Debridement Measurements: 1.2cm length x 0.7cm width x 0.2cm depth; 0.132cm^3 volume. Character of Wound/Ulcer Post Debridement is stable. Post procedure Diagnosis Wound #1: Same as Pre-Procedure Plan Wound Cleansing: Wound #1 Left,Lateral Lower Leg: Cleanse wound with mild soap and water Anesthetic (add to Medication List): Wound #1 Left,Lateral Lower  Leg: Topical Lidocaine 4% cream applied to wound bed prior to debridement (In Clinic Only). Benzocaine Topical Anesthetic Spray applied to wound bed prior to debridement (In Clinic Only). Skin Barriers/Peri-Wound Care: Moisturizing lotion Primary Wound Dressing: Wound #1 Left,Lateral Lower Leg: Silver Collagen - moistened with hydrogel ([place] Jelly) Secondary Dressing: Wound #1 Left,Lateral Lower Leg: ABD pad Dressing Change Frequency: Wound #1 Left,Lateral Lower Leg: Change Dressing Monday, Wednesday, Friday Follow-up Appointments: Wound #1 Left,Lateral Lower Leg: Return Appointment in 2 weeks. Edema Control: Teresa Meyer, Teresa Meyer ([id number]) Kerlix and Coban - Left Lower Extremity - From base of toes to 3cm below bend of knee. Do not wrap Coban too tight. Medical Decision Making Laceration without foreign body, left lower leg, subsequent encounter 11/23/2017 Status: Improving Complications: slightly smaller Interventions: using silver collagen under Kerlix Coban Non-pressure chronic ulcer of left calf limited to breakdown of skin 11/23/2017 Status: Improving Complications: smaller with a better looking surface Interventions: silver collagen Chronic venous hypertension (idiopathic) with  ulcer and inflammation of left lower extremity 11/23/2017 Status: Other Complications: patient has underlying chronic venous changes with severe skin damage. This was a laceration injury however she is at risk for further skin breakdown. There is some swelling. Interventions: continue Kerlix Coban #1 continue with silver collagen/ABDs/Kerlix Coban which the patient is tolerating well. #2 dimensions of the wound are improving Electronic Signature(s) Signed: 12/21/2017 8:05:08 AM By: Linton Ham MD Entered By: Linton Ham on 12/20/2017 19:33:55 Teresa Meyer ([id number]) -------------------------------------------------------------------------------- SuperBill Details Patient Name: Teresa Meyer Date of Service: 12/14/2017 Medical Record Number: [id number] Patient Account Number: [id number] Date of Birth/Sex: 09/13/1922 (84 y.o. F) Treating RN: Cornell Barman Primary Care Provider: [name] Other Clinician: Referring Provider: [name] Treating Provider/Extender: Tito Dine in Treatment: 3 Diagnosis Coding ICD-10 Codes Code Description [phone] Laceration without foreign body, left lower leg, subsequent encounter L97.221 Non-pressure chronic ulcer of left calf limited to breakdown of skin I87.332 Chronic venous hypertension (idiopathic) with ulcer and inflammation of left lower extremity Facility Procedures CPT4 Code: [id number] Description: [place] - DEB SUBQ TISSUE 20 SQ CM/< ICD-10 Diagnosis Description L97.221 Non-pressure chronic ulcer of left calf limited to breakdown Modifier: of skin Quantity: 1 Physician Procedures CPT4 Code: [id number] Description: 28413 - WC PHYS SUBQ TISS 20 SQ CM ICD-10 Diagnosis Description L97.221 Non-pressure chronic ulcer of left calf limited to breakdown Modifier: of skin Quantity: 1 Electronic Signature(s) Signed: 12/21/2017 8:05:08 AM By: Linton Ham MD Entered By: Linton Ham on 12/20/2017 19:40:12

## 2018-01-01 NOTE — Telephone Encounter (Signed)
Referred to [facility] - started seeing 11/23/2017

## 2018-01-01 NOTE — Progress Notes (Signed)
KAMORAH, NEVILS ([id number]) Visit Report for 12/21/2017 HPI Details Patient Name: Teresa Meyer, Teresa Meyer Date of Service: 12/21/2017 1:15 PM Medical Record Number: [id number] Patient Account Number: [id number] Date of Birth/Sex: 07-01-1922 (84 y.o. F) Treating RN: Cornell Barman Primary Care Provider: [name] Other Clinician: Referring Provider: [name] Treating Provider/Extender: Tito Dine in Treatment: 4 History of Present Illness HPI Description: ADMISSION 11/23/17 This is a 84 year old woman who lives in the independent part of the [facility] [place] [facility] facility. She tells [place] and on May 20 she had a fall and suffered a hematoma on the left anterior leg. This eventually opened. She has essentially been left with 50% of this fully epithelialized but 50% of it nonhealing. She has been using steroid cream. She tells me she has worn compression stockings in the past but has not worn this since her injury ABIs in this clinic were 0.82 on the right 0.89 on the left The patient has a history of gastroesophageal reflux disease, soft visual stricture, hypertension melanoma excisions in 2015 and 2017 and chronic venous insufficiency 11/30/17; left anterior leg wound is smaller wound bed looks healthier. Small amount of advancing epithelialization.using silver collagen as the primary dressing 12/14/17; patient with a wound on the left anterior leg. This continues to look smaller however not as viable surface. We've been using silver collagen as the primary dressing 12/21/17; left anterior leg wound is healed. She has significant chronic venous insufficiency with hemosiderin deposition but not much in the way of edema Electronic Signature(s) Signed: 12/21/2017 6:17:43 PM By: Linton Ham MD Entered By: Linton Ham on 12/21/2017 14:33:37 Teresa Meyer ([id number]) -------------------------------------------------------------------------------- Physical Exam  Details Patient Name: Teresa Meyer Date of Service: 12/21/2017 1:15 PM Medical Record Number: [id number] Patient Account Number: [id number] Date of Birth/Sex: 30-Jan-1923 (84 y.o. F) Treating RN: Cornell Barman Primary Care Provider: [name] Other Clinician: Referring Provider: [name] Treating Provider/Extender: Tito Dine in Treatment: 4 Notes wound exam; the patient has no open wound the area on the left anterior leg is well epithelialized. She has chronic venous changesbut not much in the way of edema Electronic Signature(s) Signed: 12/21/2017 6:17:43 PM By: Linton Ham MD Entered By: Linton Ham on 12/21/2017 14:34:19 Teresa Meyer ([id number]) -------------------------------------------------------------------------------- Physician Orders Details Patient Name: Teresa Meyer Date of Service: 12/21/2017 1:15 PM Medical Record Number: [id number] Patient Account Number: [id number] Date of Birth/Sex: July 28, 1922 (84 y.o. F) Treating RN: Cornell Barman Primary Care Provider: [name] Other Clinician: Referring Provider: [name] Treating Provider/Extender: Tito Dine in Treatment: 4 Verbal / Phone Orders: No Diagnosis Coding Edema Control Wound #1 Left,Lateral Lower Leg o Patient to wear own compression stockings - wear daily Discharge From [facility] Wound #1 Left,Lateral Lower Leg o Discharge from [place] - treatment complete Electronic Signature(s) Signed: 12/21/2017 6:17:43 PM By: Linton Ham MD Signed: 12/23/2017 6:17:48 PM By: Gretta Cool, BSN, RN, CWS, Kim RN, BSN Entered By: Gretta Cool, BSN, RN, CWS, Kim on 12/21/2017 13:36:53 Teresa Meyer ([id number]) -------------------------------------------------------------------------------- Problem List Details Patient Name: Teresa Meyer Date of Service: 12/21/2017 1:15 PM Medical Record Number: [id number] Patient Account Number: [id number] Date of  Birth/Sex: 1923/03/01 (84 y.o. F) Treating RN: Cornell Barman Primary Care Provider: [name] Other Clinician: Referring Provider: [name] Treating Provider/Extender: Tito Dine in Treatment: 4 Active Problems ICD-10 Evaluated Encounter Code Description Active Date Today Diagnosis S81.812D Laceration without foreign body, left lower leg, subsequent 11/23/2017 Yes Yes encounter Status  Complications Interventions Medical Improving areas fully epithelialized support stockings Decision Making : L97.221 Non-pressure chronic ulcer of left calf limited to breakdown 11/23/2017 No Yes of skin I87.332 Chronic venous hypertension (idiopathic) with ulcer and 11/23/2017 Yes Yes inflammation of left lower extremity Status Complications Interventions Other patient has underlying chronic venous changes with severe patient has support skin damage. This was a laceration injury however she is at stockings at home risk for further skin breakdown. There is some swelling. although she may need more Medical compression and Decision that we'll have to see Making : I discussed this with the patient and her caregiver who came with her. oSkin lubrication Inactive Problems Resolved Problems Electronic Signature(s) Signed: 12/21/2017 6:17:43 PM By: Linton Ham MD Entered By: Linton Ham on 12/21/2017 14:32:39 Teresa Meyer ([id number]) -------------------------------------------------------------------------------- Progress Note Details Patient Name: Teresa Meyer Date of Service: 12/21/2017 1:15 PM Medical Record Number: [id number] Patient Account Number: [id number] Date of Birth/Sex: 07-Dec-1922 (84 y.o. F) Treating RN: Cornell Barman Primary Care Provider: [name] Other Clinician: Referring Provider: [name] Treating Provider/Extender: Tito Dine in Treatment: 4 Subjective History of Present Illness (HPI) ADMISSION 11/23/17 This is a  84 year old woman who lives in the independent part of the [facility] [place] [facility] facility. She tells [place] and on May 20 she had a fall and suffered a hematoma on the left anterior leg. This eventually opened. She has essentially been left with 50% of this fully epithelialized but 50% of it nonhealing. She has been using steroid cream. She tells me she has worn compression stockings in the past but has not worn this since her injury ABIs in this clinic were 0.82 on the right 0.89 on the left The patient has a history of gastroesophageal reflux disease, soft visual stricture, hypertension melanoma excisions in 2015 and 2017 and chronic venous insufficiency 11/30/17; left anterior leg wound is smaller wound bed looks healthier. Small amount of advancing epithelialization.using silver collagen as the primary dressing 12/14/17; patient with a wound on the left anterior leg. This continues to look smaller however not as viable surface. We've been using silver collagen as the primary dressing 12/21/17; left anterior leg wound is healed. She has significant chronic venous insufficiency with hemosiderin deposition but not much in the way of edema Objective Constitutional Vitals Time Taken: 1:10 PM, Height: 60 in, Weight: 120 lbs, BMI: 23.4, Temperature: 97.7 F, Pulse: 74 bpm, Respiratory Rate: 16 breaths/min, Blood Pressure: 152/60 mmHg. Integumentary (Hair, Skin) Wound #1 status is Open. Original cause of wound was Trauma. The wound is located on the Left,Lateral Lower Leg. The wound measures 0cm length x 0cm width x 0cm depth; 0cm^2 area and 0cm^3 volume. There is no tunneling or undermining noted. There is a none present amount of drainage noted. The wound margin is flat and intact. There is no granulation within the wound bed. There is no necrotic tissue within the wound bed. The periwound skin appearance exhibited: Dry/Scaly. Periwound temperature was noted as No Abnormality. The periwound has  tenderness on palpation. Teresa Meyer, Teresa Meyer ([id number]) Assessment Active Problems ICD-10 Laceration without foreign body, left lower leg, subsequent encounter Non-pressure chronic ulcer of left calf limited to breakdown of skin Chronic venous hypertension (idiopathic) with ulcer and inflammation of left lower extremity Plan Edema Control: Wound #1 Left,Lateral Lower Leg: Patient to wear own compression stockings - wear daily [facility]charge From [facility]: Wound #1 Left,Lateral Lower Leg: Discharge from [place] - treatment complete Medical Decision Making Laceration without foreign  body, left lower leg, subsequent encounter 11/23/2017 Status: Improving Complications: areas fully epithelialized Interventions: support stockings Chronic venous hypertension (idiopathic) with ulcer and inflammation of left lower extremity 11/23/2017 Status: Other Complications: patient has underlying chronic venous changes with severe skin damage. This was a laceration injury however she is at risk for further skin breakdown. There is some swelling. Interventions: patient has support stockings at home although she may need more compression and that we'll have to see I discussed this with the patient and her caregiver who came with her. Skin lubrication #1 skin lubrication daily at bedtime and I'm okay with her support stockings for now. She may need 20-30 mm stockings if we can't maintain skin integrity Electronic Signature(s) Signed: 12/21/2017 6:17:43 PM By: Linton Ham MD Entered By: Linton Ham on 12/21/2017 14:35:09 Teresa Meyer ([id number]) -------------------------------------------------------------------------------- SuperBill Details Patient Name: Teresa Meyer Date of Service: 12/21/2017 Medical Record Number: [id number] Patient Account Number: [id number] Date of Birth/Sex: 12/13/1922 (84 y.o. F) Treating RN: Cornell Barman Primary Care Provider: [name] Other  Clinician: Referring Provider: [name] Treating Provider/Extender: Tito Dine in Treatment: 4 Diagnosis Coding ICD-10 Codes Code Description [phone] Laceration without foreign body, left lower leg, subsequent encounter L97.221 Non-pressure chronic ulcer of left calf limited to breakdown of skin I87.332 Chronic venous hypertension (idiopathic) with ulcer and inflammation of left lower extremity Facility Procedures CPT4 Code: [id number] Description: [phone] - WOUND CARE VISIT-LEV 2 EST PT Modifier: Quantity: 1 Physician Procedures CPT4: Description Modifier Quantity Code [id number] - WC PHYS LEVEL 2 - EST PT 1 ICD-10 Diagnosis Description S81.812D Laceration without foreign body, left lower leg, subsequent encounter L97.221 Non-pressure chronic ulcer of left calf limited to  breakdown of skin I87.332 Chronic venous hypertension (idiopathic) with ulcer and inflammation of left lower extremity Electronic Signature(s) Signed: 12/21/2017 6:17:43 PM By: Linton Ham MD Entered By: Linton Ham on 12/21/2017 14:35:51

## 2018-01-01 NOTE — Progress Notes (Signed)
Teresa, Meyer ([id number]) Visit Report for 12/14/2017 Arrival Information Details Patient Name: Teresa, Meyer Date of Service: 12/14/2017 12:30 PM Medical Record Number: [id number] Patient Account Number: [id number] Date of Birth/Sex: [birth year] (84 y.o. F) Treating RN: Cornell Barman Primary Care Angelisse Riso: [name] Other Clinician: Referring Morgan Rennert: [name] Treating Quenton Recendez/Extender: Tito Dine in Treatment: 3 Visit Information History Since Last Visit Pain Present Now: No Patient Arrived: Ambulatory Arrival Time: 12:48 Transfer Assistance: None Patient Identification Verified: Yes Secondary Verification Process Completed: Yes Electronic Signature(s) Signed: 12/20/2017 1:05:39 PM By: Gretta Cool, BSN, RN, CWS, Kim RN, BSN Entered By: Gretta Cool, BSN, RN, CWS, Kim on 12/20/2017 13:05:38 Teresa Meyer ([id number]) -------------------------------------------------------------------------------- Lower Extremity Assessment Details Patient Name: Teresa Meyer Date of Service: 12/14/2017 12:30 PM Medical Record Number: [id number] Patient Account Number: [id number] Date of Birth/Sex: [birth year] (84 y.o. F) Treating RN: Cornell Barman Primary Care Makailah Slavick: [name] Other Clinician: Referring Caprice Wasko: [name] Treating Clerence Gubser/Extender: Tito Dine in Treatment: 3 Edema Assessment Assessed: [Left: No] [Right: No] [Left: Edema] [Right: :] Calf Left: Right: Point of Measurement: 32 cm From Medial Instep 29.7 cm cm Ankle Left: Right: Point of Measurement: 10 cm From Medial Instep 19 cm cm Vascular Assessment Pulses: Dorsalis Pedis Palpable: [Left:Yes] Posterior Tibial Extremity colors, hair growth, and conditions: Extremity Color: [Left:Hyperpigmented] Hair Growth on Extremity: [Left:No] Temperature of Extremity: [Left:Warm] Capillary Refill: [Left:< 3 seconds] Toe Nail Assessment Left: Right: Thick: Yes Discolored:  Yes Deformed: No Improper Length and Hygiene: No Electronic Signature(s) Signed: 12/20/2017 1:07:17 PM By: Gretta Cool, BSN, RN, CWS, Kim RN, BSN Entered By: Gretta Cool, BSN, RN, CWS, Kim on 12/20/2017 13:07:17 Teresa Meyer ([id number]) -------------------------------------------------------------------------------- Multi Wound Chart Details Patient Name: Teresa Meyer Date of Service: 12/14/2017 12:30 PM Medical Record Number: [id number] Patient Account Number: [id number] Date of Birth/Sex: 09-24-1922 (84 y.o. F) Treating RN: Cornell Barman Primary Care Wallice Granville: [name] Other Clinician: Referring Eleana Tocco: [name] Treating Tensley Wery/Extender: Tito Dine in Treatment: 3 Vital Signs Height(in): 60 Pulse(bpm): 73 Weight(lbs): 120 Blood Pressure(mmHg): 166/74 Body Mass Index(BMI): 23 Temperature(F): 97.7 Respiratory Rate 16 (breaths/min): Photos: [N/A:N/A] Wound Location: Left, Lateral Lower Leg N/A N/A Wounding Event: Trauma N/A N/A Primary Etiology: Trauma, Other N/A N/A Date Acquired: 10/10/2017 N/A N/A Weeks of Treatment: 3 N/A N/A Wound Status: Open N/A N/A Measurements L x W x D 1.1x0.7x0.1 N/A N/A (cm) Area (cm) : 0.605 N/A N/A Volume (cm) : 0.06 N/A N/A % Reduction in Area: 85.90% N/A N/A % Reduction in Volume: 95.30% N/A N/A Classification: Full Thickness Without N/A N/A Exposed Support Structures Debridement: Debridement - Excisional N/A N/A Pre-procedure 13:20 N/A N/A Verification/Time Out Taken: Pain Control: Other N/A N/A Tissue Debrided: Necrotic/Eschar, Callus, N/A N/A Subcutaneous, Slough Level: Skin/Subcutaneous Tissue N/A N/A Debridement Area (sq cm): 0.77 N/A N/A Instrument: Curette N/A N/A Bleeding: Minimum N/A N/A Hemostasis Achieved: Pressure N/A N/A Debridement Treatment Procedure was tolerated well N/A N/A Response: 1.2x0.7x0.2 N/A N/A Teresa, Meyer. ([id number]) Post Debridement Measurements L x W x D (cm) Post  Debridement Volume: 0.132 N/A N/A (cm) Periwound Skin Texture: No Abnormalities Noted N/A N/A Periwound Skin Moisture: No Abnormalities Noted N/A N/A Periwound Skin Color: No Abnormalities Noted N/A N/A Tenderness on Palpation: No N/A N/A Procedures Performed: Debridement N/A N/A Treatment Notes Electronic Signature(s) Signed: 12/21/2017 8:05:08 AM By: Linton Ham MD Previous Signature: 12/20/2017 1:07:34 PM Version By: Gretta Cool, BSN, RN, CWS, Kim RN, BSN Entered By: Linton Ham on 12/20/2017 19:30:59 Teresa Kehr  Teresa Meyer ([id number]) -------------------------------------------------------------------------------- [place] Details Patient Name: Teresa, Meyer Date of Service: 12/14/2017 12:30 PM Medical Record Number: [id number] Patient Account Number: [id number] Date of Birth/Sex: Dec 25, 1922 (84 y.o. F) Treating RN: Cornell Barman Primary Care Raphaela Cannaday: [name] Other Clinician: Referring Chrisoula Zegarra: [name] Treating Tacora Athanas/Extender: Tito Dine in Treatment: 3 Active Inactive ` Necrotic Tissue Nursing Diagnoses: Impaired tissue integrity related to necrotic/devitalized tissue Goals: Necrotic/devitalized tissue will be minimized in the wound bed Date Initiated: 11/23/2017 Target Resolution Date: 12/24/2017 Goal Status: Active Interventions: Provide education on necrotic tissue and debridement process Treatment Activities: Apply topical anesthetic as ordered : 11/23/2017 Notes: ` Orientation to the Wound Care Program Nursing Diagnoses: Knowledge deficit related to the wound healing center program Goals: Patient/caregiver will verbalize understanding of the [place] Program Date Initiated: 11/23/2017 Target Resolution Date: 12/24/2017 Goal Status: Active Interventions: Provide education on orientation to the wound center Notes: ` Venous Leg Ulcer Nursing Diagnoses: Actual venous Insuffiency (use after diagnosis is  confirmed) Goals: Patient will maintain optimal edema control Date Initiated: 11/23/2017 Target Resolution Date: 12/24/2017 Teresa, Meyer ([id number]) Goal Status: Active Interventions: Assess peripheral edema status every visit. Treatment Activities: Therapeutic compression applied : 11/23/2017 Notes: ` Wound/Skin Impairment Nursing Diagnoses: Impaired tissue integrity Goals: Ulcer/skin breakdown will have a volume reduction of 30% by week 4 Date Initiated: 11/23/2017 Target Resolution Date: 12/24/2017 Goal Status: Active Interventions: Assess ulceration(s) every visit Treatment Activities: Topical wound management initiated : 11/23/2017 Notes: Electronic Signature(s) Signed: 12/20/2017 1:07:26 PM By: Gretta Cool, BSN, RN, CWS, Kim RN, BSN Entered By: Gretta Cool, BSN, RN, CWS, Kim on 12/20/2017 13:07:25 Teresa Meyer ([id number]) -------------------------------------------------------------------------------- Pain Assessment Details Patient Name: Teresa Meyer Date of Service: 12/14/2017 12:30 PM Medical Record Number: [id number] Patient Account Number: [id number] Date of Birth/Sex: February 13, 1923 (84 y.o. F) Treating RN: Cornell Barman Primary Care Donta Mcinroy: [name] Other Clinician: Referring Helder Crisafulli: [name] Treating Brityn Mastrogiovanni/Extender: Tito Dine in Treatment: 3 Active Problems Location of Pain Severity and Description of Pain Patient Has Paino No Site Locations Pain Management and Medication Current Pain Management: Electronic Signature(s) Signed: 12/20/2017 1:05:46 PM By: Gretta Cool, BSN, RN, CWS, Kim RN, BSN Entered By: Gretta Cool, BSN, RN, CWS, Kim on 12/20/2017 13:05:46 Teresa Meyer ([id number]) -------------------------------------------------------------------------------- Wound Assessment Details Patient Name: Teresa Meyer Date of Service: 12/14/2017 12:30 PM Medical Record Number: [id number] Patient Account Number: [id number] Date of Birth/Sex: [birth year]  (84 y.o. F) Treating RN: Cornell Barman Primary Care Genene Kilman: [name] Other Clinician: Referring Fed Ceci: [name] Treating Arionne Iams/Extender: Tito Dine in Treatment: 3 Wound Status Wound Number: 1 Primary Etiology: Trauma, Other Wound Location: Left, Lateral Lower Leg Wound Status: Open Wounding Event: Trauma Date Acquired: 10/10/2017 Weeks Of Treatment: 3 Clustered Wound: No Photos Photo Uploaded By: Sharon Mt on 12/20/2017 13:17:33 Wound Measurements Length: (cm) 1.1 Width: (cm) 0.7 Depth: (cm) 0.1 Area: (cm) 0.605 Volume: (cm) 0.06 % Reduction in Area: 85.9% % Reduction in Volume: 95.3% Wound Description Full Thickness Without Exposed Support Classification: Structures Periwound Skin Texture Texture Color No Abnormalities Noted: No No Abnormalities Noted: No Moisture No Abnormalities Noted: No Electronic Signature(s) Signed: 12/23/2017 6:17:48 PM By: Gretta Cool, BSN, RN, CWS, Kim RN, BSN Entered By: Gretta Cool, BSN, RN, CWS, Kim on 12/20/2017 13:06:21 Teresa Meyer ([id number]) -------------------------------------------------------------------------------- Vitals Details Patient Name: Teresa Meyer Date of Service: 12/14/2017 12:30 PM Medical Record Number: [id number] Patient Account Number: [id number] Date of Birth/Sex: November 13, 1922 (84 y.o. F) Treating RN: Cornell Barman Primary Care  Malin Cervini: [name] Other Clinician: Referring Chelsee Hosie: [name] Treating Niguel Moure/Extender: Tito Dine in Treatment: 3 Vital Signs Time Taken: 12:49 Temperature (F): 97.7 Height (in): 60 Pulse (bpm): 73 Weight (lbs): 120 Respiratory Rate (breaths/min): 16 Body Mass Index (BMI): 23.4 Blood Pressure (mmHg): 166/74 Reference Range: 80 - 120 mg / dl Electronic Signature(s) Signed: 12/20/2017 1:06:06 PM By: Gretta Cool, BSN, RN, CWS, Kim RN, BSN Entered By: Gretta Cool, BSN, RN, CWS, Kim on 12/20/2017 13:06:06

## 2018-01-04 NOTE — Progress Notes (Signed)
IVORY, MADURO ([id number]) Visit Report for 12/21/2017 Arrival Information Details Patient Name: Teresa Meyer, Teresa Meyer Date of Service: 12/21/2017 1:15 PM Medical Record Number: [id number] Patient Account Number: [id number] Date of Birth/Sex: Nov 07, 1922 (84 y.o. F) Treating RN: Ahmed Prima Primary Care Yuri Flener: [name] Other Clinician: Referring Brandey Vandalen: [name] Treating Nailah Luepke/Extender: Tito Dine in Treatment: 4 Visit Information History Since Last Visit All ordered tests and consults were completed: No Patient Arrived: Cane Added or deleted any medications: No Arrival Time: 13:10 Any new allergies or adverse reactions: No Accompanied By: caregiver Had a fall or experienced change in No Transfer Assistance: EasyPivot Patient activities of daily living that may affect Lift risk of falls: Patient Identification Verified: Yes Signs or symptoms of abuse/neglect since last visito No Secondary Verification Process Yes Hospitalized since last visit: No Completed: Implantable device outside of the clinic excluding No Patient Requires Transmission-Based No cellular tissue based products placed in the center Precautions: since last visit: Patient Has Alerts: No Has Dressing in Place as Prescribed: Yes Pain Present Now: No Electronic Signature(s) Signed: 12/26/2017 4:59:33 PM By: Alric Quan Entered By: Alric Quan on 12/21/2017 13:10:41 Teresa Meyer ([id number]) -------------------------------------------------------------------------------- Clinic Level of Care Assessment Details Patient Name: Teresa Meyer Date of Service: 12/21/2017 1:15 PM Medical Record Number: [id number] Patient Account Number: [id number] Date of Birth/Sex: 1922-07-22 (84 y.o. F) Treating RN: Cornell Barman Primary Care Nedda Gains: [name] Other Clinician: Referring Charvez Voorhies: [name] Treating Denzell Colasanti/Extender: Tito Dine in  Treatment: 4 Clinic Level of Care Assessment Items TOOL 4 Quantity Score []  - Use when only an EandM is performed on FOLLOW-UP visit 0 ASSESSMENTS - Nursing Assessment / Reassessment []  - Reassessment of Co-morbidities (includes updates in patient status) 0 X- 1 5 Reassessment of Adherence to Treatment Plan ASSESSMENTS - Wound and Skin Assessment / Reassessment X - Simple Wound Assessment / Reassessment - one wound 1 5 []  - 0 Complex Wound Assessment / Reassessment - multiple wounds []  - 0 Dermatologic / Skin Assessment (not related to wound area) ASSESSMENTS - Focused Assessment []  - Circumferential Edema Measurements - multi extremities 0 []  - 0 Nutritional Assessment / Counseling / Intervention []  - 0 Lower Extremity Assessment (monofilament, tuning fork, pulses) []  - 0 Peripheral Arterial Disease Assessment (using hand held doppler) ASSESSMENTS - Ostomy and/or Continence Assessment and Care []  - Incontinence Assessment and Management 0 []  - 0 Ostomy Care Assessment and Management (repouching, etc.) PROCESS - Coordination of Care X - Simple Patient / Family Education for ongoing care 1 15 []  - 0 Complex (extensive) Patient / Family Education for ongoing care []  - 0 Staff obtains Programmer, systems, Records, Test Results / Process Orders []  - 0 Staff telephones HHA, Nursing Homes / Clarify orders / etc []  - 0 Routine Transfer to another Facility (non-emergent condition) []  - 0 Routine Hospital Admission (non-emergent condition) []  - 0 New Admissions / Biomedical engineer / Ordering NPWT, Apligraf, etc. []  - 0 Emergency Hospital Admission (emergent condition) X- 1 10 Simple Discharge Coordination AOWYN, ROZEBOOM L. ([id number]) []  - 0 Complex (extensive) Discharge Coordination PROCESS - Special Needs []  - Pediatric / Minor Patient Management 0 []  - 0 Isolation Patient Management []  - 0 Hearing / Language / Visual special needs []  - 0 Assessment of Community assistance  (transportation, D/C planning, etc.) []  - 0 Additional assistance / Altered mentation []  - 0 Support Surface(s) Assessment (bed, cushion, seat, etc.) INTERVENTIONS - Wound Cleansing / Measurement []  - Simple Wound Cleansing -  one wound 0 []  - 0 Complex Wound Cleansing - multiple wounds X- 1 5 Wound Imaging (photographs - any number of wounds) []  - 0 Wound Tracing (instead of photographs) X- 1 5 Simple Wound Measurement - one wound []  - 0 Complex Wound Measurement - multiple wounds INTERVENTIONS - Wound Dressings []  - Small Wound Dressing one or multiple wounds 0 []  - 0 Medium Wound Dressing one or multiple wounds []  - 0 Large Wound Dressing one or multiple wounds []  - 0 Application of Medications - topical []  - 0 Application of Medications - injection INTERVENTIONS - Miscellaneous []  - External ear exam 0 []  - 0 Specimen Collection (cultures, biopsies, blood, body fluids, etc.) []  - 0 Specimen(s) / Culture(s) sent or taken to Lab for analysis []  - 0 Patient Transfer (multiple staff / Civil Service fast streamer / Similar devices) []  - 0 Simple Staple / Suture removal (25 or less) []  - 0 Complex Staple / Suture removal (26 or more) []  - 0 Hypo / Hyperglycemic Management (close monitor of Blood Glucose) []  - 0 Ankle / Brachial Index (ABI) - do not check if billed separately X- 1 5 Vital Signs Mccumbers, Haevyn L. ([id number]) Has the patient been seen at the hospital within the last three years: Yes Total Score: 50 Level Of Care: New/Established - Level 2 Electronic Signature(s) Signed: 12/23/2017 6:17:48 PM By: Gretta Cool, BSN, RN, CWS, Kim RN, BSN Entered By: Gretta Cool, BSN, RN, CWS, Kim on 12/21/2017 13:37:39 Teresa Meyer ([id number]) -------------------------------------------------------------------------------- Lower Extremity Assessment Details Patient Name: Teresa Meyer Date of Service: 12/21/2017 1:15 PM Medical Record Number: [id number] Patient Account Number: [id number] Date of  Birth/Sex: 1922-05-29 (84 y.o. F) Treating RN: Ahmed Prima Primary Care Kelicia Youtz: [name] Other Clinician: Referring Glorianna Gott: [name] Treating Nature Kueker/Extender: Tito Dine in Treatment: 4 Edema Assessment Assessed: [Left: No] [Right: No] [Left: Edema] [Right: :] Calf Left: Right: Point of Measurement: 32 cm From Medial Instep 31.3 cm cm Ankle Left: Right: Point of Measurement: 10 cm From Medial Instep 21.1 cm cm Vascular Assessment Pulses: Dorsalis Pedis Palpable: [Left:Yes] Posterior Tibial Extremity colors, hair growth, and conditions: Extremity Color: [Left:Hyperpigmented] Temperature of Extremity: [Left:Warm] Capillary Refill: [Left:< 3 seconds] Toe Nail Assessment Left: Right: Thick: No Discolored: No Deformed: No Improper Length and Hygiene: No Electronic Signature(s) Signed: 12/26/2017 4:59:33 PM By: Alric Quan Entered By: Alric Quan on 12/21/2017 13:21:18 Teresa Meyer ([id number]) -------------------------------------------------------------------------------- Multi Wound Chart Details Patient Name: Teresa Meyer Date of Service: 12/21/2017 1:15 PM Medical Record Number: [id number] Patient Account Number: [id number] Date of Birth/Sex: January 10, 1923 (84 y.o. F) Treating RN: Cornell Barman Primary Care Hae Ahlers: [name] Other Clinician: Referring Shireen Rayburn: [name] Treating Donevin Sainsbury/Extender: Tito Dine in Treatment: 4 Vital Signs Height(in): 60 Pulse(bpm): 74 Weight(lbs): 120 Blood Pressure(mmHg): 152/60 Body Mass Index(BMI): 23 Temperature(F): 97.7 Respiratory Rate 16 (breaths/min): Photos: [1:No Photos] [N/A:N/A] Wound Location: [1:Left Lower Leg - Lateral] [N/A:N/A] Wounding Event: [1:Trauma] [N/A:N/A] Primary Etiology: [1:Trauma, Other] [N/A:N/A] Comorbid History: [1:Cataracts, Anemia, Lymphedema, Hypertension, Rheumatoid Arthritis, Osteoarthritis] [N/A:N/A] Date  Acquired: [1:10/10/2017] [N/A:N/A] Weeks of Treatment: [1:4] [N/A:N/A] Wound Status: [1:Open] [N/A:N/A] Measurements L x W x D [1:0x0x0] [N/A:N/A] (cm) Area (cm) : [1:0] [N/A:N/A] Volume (cm) : [1:0] [N/A:N/A] % Reduction in Area: [1:100.00%] [N/A:N/A] % Reduction in Volume: [1:100.00%] [N/A:N/A] Classification: [1:Full Thickness Without Exposed Support Structures] [N/A:N/A] Exudate Amount: [1:None Present] [N/A:N/A] Wound Margin: [1:Flat and Intact] [N/A:N/A] Granulation Amount: [1:None Present (0%)] [N/A:N/A] Necrotic Amount: [1:None Present (0%)] [N/A:N/A] Exposed Structures: [1:Fascia: No  Fat Layer (Subcutaneous Tissue) Exposed: No Tendon: No Muscle: No Joint: No Bone: No] [N/A:N/A] Epithelialization: [1:None] [N/A:N/A] Periwound Skin Texture: [1:No Abnormalities Noted] [N/A:N/A] Periwound Skin Moisture: [1:Dry/Scaly: Yes] [N/A:N/A] Periwound Skin Color: [1:No Abnormalities Noted] [N/A:N/A] Temperature: [1:No Abnormality] [N/A:N/A] Tenderness on Palpation: [1:Yes] [N/A:N/A] Wound Preparation: [N/A:N/A] Ulcer Cleansing: Rinsed/Irrigated with Saline Topical Anesthetic Applied: None Treatment Notes Electronic Signature(s) Signed: 12/21/2017 6:17:43 PM By: Linton Ham MD Entered By: Linton Ham on 12/21/2017 14:32:56 Teresa Meyer ([id number]) -------------------------------------------------------------------------------- Multi-Disciplinary Care Plan Details Patient Name: Teresa Meyer Date of Service: 12/21/2017 1:15 PM Medical Record Number: [id number] Patient Account Number: [id number] Date of Birth/Sex: Sep 10, 1922 (84 y.o. F) Treating RN: Cornell Barman Primary Care Haru Shaff: [name] Other Clinician: Referring Chassidy Layson: [name] Treating Shakiyla Kook/Extender: Tito Dine in Treatment: 4 Active Inactive Electronic Signature(s) Signed: 12/23/2017 6:17:48 PM By: Gretta Cool, BSN, RN, CWS, Kim RN, BSN Entered By: Gretta Cool, BSN, RN, CWS, Kim on  12/21/2017 13:35:38 Teresa Meyer ([id number]) -------------------------------------------------------------------------------- Pain Assessment Details Patient Name: Teresa Meyer Date of Service: 12/21/2017 1:15 PM Medical Record Number: [id number] Patient Account Number: [id number] Date of Birth/Sex: [birth year] (84 y.o. F) Treating RN: Ahmed Prima Primary Care Azaria Stegman: [name] Other Clinician: Referring Muneer Leider: [name] Treating Marygrace Sandoval/Extender: Tito Dine in Treatment: 4 Active Problems Location of Pain Severity and Description of Pain Patient Has Paino No Site Locations Pain Management and Medication Current Pain Management: Electronic Signature(s) Signed: 12/26/2017 4:59:33 PM By: Alric Quan Entered By: Alric Quan on 12/21/2017 13:10:47 Teresa Meyer ([id number]) -------------------------------------------------------------------------------- Wound Assessment Details Patient Name: Teresa Meyer Date of Service: 12/21/2017 1:15 PM Medical Record Number: [id number] Patient Account Number: [id number] Date of Birth/Sex: [birth year] (84 y.o. F) Treating RN: Ahmed Prima Primary Care Earnesteen Birnie: [name] Other Clinician: Referring Juwann Sherk: [name] Treating Ambar Raphael/Extender: Tito Dine in Treatment: 4 Wound Status Wound Number: 1 Primary Trauma, Other Etiology: Wound Location: Left Lower Leg - Lateral Wound Open Wounding Event: Trauma Status: Date Acquired: 10/10/2017 Comorbid Cataracts, Anemia, Lymphedema, Weeks Of Treatment: 4 History: Hypertension, Rheumatoid Arthritis, Clustered Wound: No Osteoarthritis Wound Measurements Length: (cm) 0 % Red Width: (cm) 0 % Red Depth: (cm) 0 Epith Area: (cm) 0 Tunn Volume: (cm) 0 Unde uction in Area: 100% uction in Volume: 100% elialization: None eling: No rmining: No Wound Description Full Thickness Without Exposed  Support Classification: Structures Wound Margin: Flat and Intact Exudate None Present Amount: Foul Odor After Cleansing: No Slough/Fibrino No Wound Bed Granulation Amount: None Present (0%) Exposed Structure Necrotic Amount: None Present (0%) Fascia Exposed: No Fat Layer (Subcutaneous Tissue) Exposed: No Tendon Exposed: No Muscle Exposed: No Joint Exposed: No Bone Exposed: No Periwound Skin Texture Texture Color No Abnormalities Noted: No No Abnormalities Noted: No Moisture Temperature / Pain No Abnormalities Noted: No Temperature: No Abnormality Dry / Scaly: Yes Tenderness on Palpation: Yes Wound Preparation Ulcer Cleansing: Rinsed/Irrigated with Saline Topical Anesthetic Applied: None Electronic Signature(s) Signed: 12/26/2017 4:59:33 PM By: Varney Biles, Trinidad Curet ([id number]) Entered By: Alric Quan on 12/21/2017 13:19:48 Teresa Meyer ([id number]) -------------------------------------------------------------------------------- [place] Details Patient Name: Teresa Meyer Date of Service: 12/21/2017 1:15 PM Medical Record Number: [id number] Patient Account Number: [id number] Date of Birth/Sex: 1922-08-08 (84 y.o. F) Treating RN: Ahmed Prima Primary Care Yeily Link: [name] Other Clinician: Referring Messina Kosinski: [name] Treating Llana Deshazo/Extender: Tito Dine in Treatment: 4 Vital Signs Time Taken: 13:10 Temperature (F): 97.7 Height (in): 60 Pulse (bpm): 74 Weight (lbs): 120 Respiratory Rate (breaths/min): 16  Body Mass Index (BMI): 23.4 Blood Pressure (mmHg): 152/60 Reference Range: 80 - 120 mg / dl Electronic Signature(s) Signed: 12/26/2017 4:59:33 PM By: Alric Quan Entered By: Alric Quan on 12/21/2017 13:15:00

## 2018-01-24 ENCOUNTER — Inpatient Hospital Stay: Payer: Medicare Other | Attending: Hematology and Oncology

## 2018-01-24 DIAGNOSIS — D509 Iron deficiency anemia, unspecified: Secondary | ICD-10-CM | POA: Diagnosis not present

## 2018-01-24 LAB — CBC WITH DIFFERENTIAL/PLATELET
Basophils Absolute: 0 10*3/uL (ref 0–0.1)
Basophils Relative: 0 %
Eosinophils Absolute: 0.2 10*3/uL (ref 0–0.7)
Eosinophils Relative: 3 %
HCT: 35.1 % (ref 35.0–47.0)
Hemoglobin: 11.7 g/dL — ABNORMAL LOW (ref 12.0–16.0)
Lymphocytes Relative: 16 %
Lymphs Abs: 1 10*3/uL (ref 1.0–3.6)
MCH: 31.5 pg (ref 26.0–34.0)
MCHC: 33.5 g/dL (ref 32.0–36.0)
MCV: 94 fL (ref 80.0–100.0)
Monocytes Absolute: 0.5 10*3/uL (ref 0.2–0.9)
Monocytes Relative: 9 %
Neutro Abs: 4.4 10*3/uL (ref 1.4–6.5)
Neutrophils Relative %: 72 %
Platelets: 265 10*3/uL (ref 150–440)
RBC: 3.73 MIL/uL — ABNORMAL LOW (ref 3.80–5.20)
RDW: 19.2 % — ABNORMAL HIGH (ref 11.5–14.5)
WBC: 6.2 10*3/uL (ref 3.6–11.0)

## 2018-01-24 LAB — FERRITIN: Ferritin: 47 ng/mL (ref 11–307)

## 2018-02-09 ENCOUNTER — Ambulatory Visit: Payer: Self-pay

## 2018-02-09 ENCOUNTER — Ambulatory Visit: Payer: Medicare Other

## 2018-02-09 ENCOUNTER — Ambulatory Visit (INDEPENDENT_AMBULATORY_CARE_PROVIDER_SITE_OTHER): Payer: Medicare Other | Admitting: Family Medicine

## 2018-02-09 ENCOUNTER — Encounter: Payer: Self-pay | Admitting: Family Medicine

## 2018-02-09 VITALS — BP 124/82 | HR 67 | Temp 97.8°F | Ht 60.5 in | Wt 121.0 lb

## 2018-02-09 DIAGNOSIS — Z23 Encounter for immunization: Secondary | ICD-10-CM

## 2018-02-09 DIAGNOSIS — R600 Localized edema: Secondary | ICD-10-CM | POA: Diagnosis not present

## 2018-02-09 DIAGNOSIS — Z0000 Encounter for general adult medical examination without abnormal findings: Secondary | ICD-10-CM

## 2018-02-09 DIAGNOSIS — M8580 Other specified disorders of bone density and structure, unspecified site: Secondary | ICD-10-CM

## 2018-02-09 DIAGNOSIS — Z7189 Other specified counseling: Secondary | ICD-10-CM

## 2018-02-09 DIAGNOSIS — D539 Nutritional anemia, unspecified: Secondary | ICD-10-CM

## 2018-02-09 DIAGNOSIS — S81802D Unspecified open wound, left lower leg, subsequent encounter: Secondary | ICD-10-CM

## 2018-02-09 DIAGNOSIS — I10 Essential (primary) hypertension: Secondary | ICD-10-CM

## 2018-02-09 DIAGNOSIS — Z66 Do not resuscitate: Secondary | ICD-10-CM

## 2018-02-09 DIAGNOSIS — I872 Venous insufficiency (chronic) (peripheral): Secondary | ICD-10-CM

## 2018-02-09 MED ORDER — LISINOPRIL 5 MG PO TABS: 5.0000 mg | ORAL_TABLET | Freq: Every day | ORAL | 1 refills | Status: DC

## 2018-02-09 NOTE — Assessment & Plan Note (Signed)
Chronic, stable. She feels lisinopril may have caused diarrhea - desires trial of lower dose. I think BP will tolerate this well.

## 2018-02-09 NOTE — Assessment & Plan Note (Signed)
Continue compression stockings

## 2018-02-09 NOTE — Assessment & Plan Note (Signed)

## 2018-02-09 NOTE — Patient Instructions (Addendum)
Flu shot today Try lower lisinopril dose 5mg  daily - sent to pharmacy. Watch diarrhea effect. Bring in copy of your advanced directive and health care power of attorney.  Return in 6 months for follow up, sooner if needed.

## 2018-02-09 NOTE — Assessment & Plan Note (Addendum)
Advanced directive discussion - has set up. DNR in chart 2016. Daughter is HCPOA Teresa Meyer). Asked to bring [place] copy of advanced directives.

## 2018-02-09 NOTE — Assessment & Plan Note (Addendum)
Followed by heme - iron deficiency on iron replacement, latest CBC, ferritin improving. Marland Kitchen

## 2018-02-09 NOTE — Progress Notes (Signed)
BP 124/82 (BP Location: Left Arm, Patient Position: Sitting, Cuff Size: Normal)   Meyer 67   Temp 97.8 F (36.6 C) (Oral)   Ht 5' 0.5" (1.537 m)   Wt 121 lb (54.9 kg)   SpO2 99%   BMI 23.24 kg/m   BP Readings from Last 3 Encounters:  02/09/18 124/82  12/13/17 (!) 167/79  11/11/17 132/80    CC: AMW Subjective:    Patient ID: Teresa Meyer, female    DOB: 17-Apr-1923, 84 y.o.   MRN: [id number]  HPI: Teresa Meyer is a 84 y.o. female presenting on 02/09/2018 for Medicare Wellness (Pt states lisinopril is causing diarrhea. Pt accompanied by her nusing aid, Teresa Meyer.)   See prior notes for details.  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   Noticing increasing diarrhea - attributes to lisinopril. stooling 2-3 times a day loose to watery stool. No blood in stool or abd pain, fevers.  Hearing screen - wears hearing aides Vision screen - sees eye doctor Fall risk screen - 1 fall with injury Depression screen - passes  Preventative: Colon cancer screening - age out Breast cancer screening - age out Well woman exam - age out DEXA scan -  Flu shot - yearly Tetanus shot - declines Pneumovax 2013 Zostavax - declines Shingrix - declines Advanced directive discussion - has set up. DNR in chart 2016. Daughter is HCPOA Teresa Meyer). Asked to bring [place] copy of advanced directives.  Seat belt use discussed.  Sunscreen use discussed. No changing moles on skin.  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  Interim medical history since our last visit reviewed. Allergies and medications reviewed and updated. Outpatient Medications Prior to Visit  Medication Sig Dispense Refill  . calcium carbonate (TUMS - DOSED IN MG ELEMENTAL CALCIUM) 500 MG chewable tablet Chew 1 tablet by mouth 2 (two) times daily with a meal.    . cholecalciferol (VITAMIN D) 1000 UNITS tablet Take 1,000 Units by mouth daily.    . collagenase (SANTYL) ointment Apply 1 application topically daily. 30 g 0  . diphenhydramine-acetaminophen (TYLENOL PM) 25-500 MG TABS Take 2 tablets by mouth at bedtime as needed.    . ferrous sulfate 325 (65 FE) MG tablet Take 1 tablet (325 mg total) by mouth once a week. (Patient taking differently: Take 325 mg by mouth daily with breakfast. )    . prednisoLONE acetate (PRED MILD) 0.12 % ophthalmic suspension Place 1 drop into the left eye daily.     . vitamin B-12 (CYANOCOBALAMIN) 1000 MCG tablet Take 1 tablet (1,000 mcg total) by mouth 2 (two) times a week.    . vitamin C (ASCORBIC ACID) 250 MG tablet Take 1 tablet (250 mg total) by mouth daily. 90 tablet 0  . lisinopril (PRINIVIL,ZESTRIL) 10 MG tablet Take 1 tablet (10 mg total) by mouth daily. 30 tablet 3   No facility-administered medications prior to visit.      Per HPI unless specifically indicated in ROS section below Review of Systems  Objective:    BP 124/82 (BP Location: Left Arm, Patient Position: Sitting, Cuff Size: Normal)   Meyer 67   Temp 97.8 F (36.6 C) (Oral)   Ht 5' 0.5" (1.537 m)   Wt 121 lb (54.9 kg)   SpO2 99%   BMI 23.24 kg/m   Wt Readings from Last 3 Encounters:  02/09/18 121 lb (54.9 kg)  12/13/17 120 lb 14.4 oz (54.8 kg)  11/11/17 119 lb (54 kg)    Physical Exam  Constitutional: She is oriented to person, place, and time. She appears well-developed and well-nourished. No distress.  HENT:  Head: Normocephalic and atraumatic.  Right Ear: Hearing, tympanic membrane, external ear and ear canal normal.    Left Ear: Hearing, tympanic membrane, external ear and ear canal normal.  Nose: Nose normal.  Mouth/Throat: Uvula is midline, oropharynx is clear and moist and mucous membranes are normal. No oropharyngeal exudate, posterior oropharyngeal edema or posterior oropharyngeal erythema.  Eyes: Pupils are equal, round, and reactive to light. Conjunctivae and EOM are normal. No scleral icterus.  Neck: Normal range of motion. Neck supple. No thyromegaly present.  Cardiovascular: Normal rate, regular rhythm and intact distal pulses.  Murmur (systolic) heard. Pulses:      Radial pulses are 2+ on the right side, and 2+ on the left side.  Pulmonary/Chest: Effort normal and breath sounds normal. No respiratory distress. She has no wheezes. She has no rales.  Abdominal: Soft. Bowel sounds are normal. She exhibits no distension and no mass. There is no tenderness. There is no rebound and no guarding.  Musculoskeletal: Normal range of motion. She exhibits no edema.  Lymphadenopathy:    She has no cervical adenopathy.  Neurological: She is alert and oriented to person, place, and time.  CN grossly intact, station and gait intact Recall 2/3, 3/3 with cue Calculation 5/5 serial 7s  Skin: Skin is warm and dry. No rash noted.  Psychiatric: She has a normal mood and affect. Her behavior is normal. Judgment and thought content normal.  Nursing note and vitals reviewed.  Results for orders placed or performed in visit on 01/24/18  Ferritin  Result Value Ref Range   Ferritin 47 11 - 307 ng/mL  CBC with Differential  Result Value Ref Range   WBC 6.2 3.6 - 11.0 K/uL   RBC 3.73 (L) 3.80 - 5.20 MIL/uL   Hemoglobin 11.7 (L) 12.0 - 16.0 g/dL   HCT 35.1 35.0 - 47.0 %   MCV 94.0 80.0 - 100.0 fL   MCH 31.5 26.0 - 34.0 pg   MCHC 33.5 32.0 - 36.0 g/dL   RDW 19.2 (H) 11.5 - 14.5 %   Platelets 265 150 - 440 K/uL   Neutrophils Relative % 72 %   Neutro Abs 4.4 1.4 - 6.5 K/uL   Lymphocytes Relative 16 %   Lymphs Abs  1.0 1.0 - 3.6 K/uL   Monocytes Relative 9 %   Monocytes Absolute 0.5 0.2 - 0.9 K/uL   Eosinophils Relative 3 %   Eosinophils Absolute 0.2 0 - 0.7 K/uL   Basophils Relative 0 %   Basophils Absolute 0.0 0 - 0.1 K/uL      Assessment & Plan:   Problem List Items Addressed This Visit    Pedal edema    Continue compression stockings.       Osteopenia   Medicare annual wellness visit, subsequent - Primary    I have personally reviewed the Medicare Annual Wellness questionnaire and have noted 1. The  patient's medical and social history 2. Their use of alcohol, tobacco or illicit drugs 3. Their current medications and supplements 4. The patient's functional ability including ADL's, fall risks, home safety risks and hearing or visual impairment. Cognitive function has been assessed and addressed as indicated.  5. Diet and physical activity 6. Evidence for depression or mood disorders The patients weight, height, BMI have been recorded in the chart. I have made referrals, counseling and provided education to the patient based on review of the above and I have provided the pt with a written personalized care plan for preventive services. Provider list updated.. See scanned questionairre as needed for further documentation. Reviewed preventative protocols and updated unless pt declined.       Macrocytic anemia    Followed by heme - iron deficiency on iron replacement, latest CBC, ferritin improving. Marland Kitchen       RESOLVED: Leg wound, left, subsequent encounter    This has fully resolved. Appreciate wound care.      Essential hypertension    Chronic, stable. She feels lisinopril may have caused diarrhea - desires trial of lower dose. I think BP will tolerate this well.      Relevant Medications   lisinopril (PRINIVIL,ZESTRIL) 5 MG tablet   DNR (do not resuscitate)   Chronic venous insufficiency    Continue compression stockings      Relevant Medications   lisinopril (PRINIVIL,ZESTRIL)  5 MG tablet   Advanced care planning/counseling discussion    Advanced directive discussion - has set up. DNR in chart 2016. Daughter is HCPOA Teresa Meyer). Asked to bring [place] copy of advanced directives.        Other Visit Diagnoses    Need for influenza vaccination       Relevant Orders   Flu Vaccine QUAD 36+ mos IM (Completed)       Meds ordered this encounter  Medications  . lisinopril (PRINIVIL,ZESTRIL) 5 MG tablet    Sig: Take 1 tablet (5 mg total) by mouth daily.    Dispense:  90 tablet    Refill:  1   Orders Placed This Encounter  Procedures  . Flu Vaccine QUAD 36+ mos IM    Follow up plan: Return in about 6 months (around 08/10/2018) for follow up visit.  [name], MD

## 2018-02-09 NOTE — Assessment & Plan Note (Signed)
This has fully resolved. Appreciate wound care.

## 2018-03-15 ENCOUNTER — Inpatient Hospital Stay: Payer: Medicare Other

## 2018-03-16 ENCOUNTER — Inpatient Hospital Stay: Payer: Medicare Other | Admitting: Hematology and Oncology

## 2018-03-21 ENCOUNTER — Telehealth: Payer: Self-pay

## 2018-03-21 NOTE — Telephone Encounter (Addendum)
Spoke with pt relaying Dr. Synthia Innocent message. Pt states she canceled the appt because she does not know why she has to keep having blood drawn.  Also, states it is such an inconvenience because she has to get someone to take her.

## 2018-03-21 NOTE — Telephone Encounter (Signed)
Copied from [place] [id number]. Topic: General - Other >> Mar 21, 2018 10:40 AM Keene Breath wrote: Reason for CRM: Patient called to inform doctor that she is scheduled to give blood at the [facility] on this Thursday.  Patient feels this is not necessary since she gives blood at Dr. Danise Mina office as well and she said she never hears about any results when she gives it at the center.  Patient stated that she would like to cancel that appointment if the doctor feels it is ok.  She stated that if she does not hear back from the doctor by lunch time, she will cancel that appointment.  Please advise.  CB# [phone]

## 2018-03-21 NOTE — Telephone Encounter (Signed)
plz notify - I do recommend she keep upcoming lab visit appt at [facility] - as this is so the hematologist can have latest labs when she sees her on Friday.

## 2018-03-23 ENCOUNTER — Inpatient Hospital Stay: Payer: Medicare Other

## 2018-03-24 ENCOUNTER — Inpatient Hospital Stay: Payer: Medicare Other | Admitting: Hematology and Oncology

## 2018-06-19 ENCOUNTER — Telehealth: Payer: Self-pay | Admitting: *Deleted

## 2018-06-19 MED ORDER — DOXYCYCLINE HYCLATE 100 MG PO TABS: 100.0000 mg | ORAL_TABLET | Freq: Two times a day (BID) | ORAL | 0 refills | Status: DC

## 2018-06-19 NOTE — Telephone Encounter (Signed)
Let's start antibiotic sent to pharmacy and schedule f/u appt in office tomorrow am. If similar wound to last year's, may need wound clinic referral.

## 2018-06-19 NOTE — Telephone Encounter (Addendum)
Spoke with [employer], of [facility], relaying Dr. Synthia Innocent message.  She verbalizes understanding and states pt does have a wound similar to past wound.  Says the area is black and scabbed over.  But compresses when palpated.  However, the lateral edge is red.   Pt scheduled for OV tomorrow at 11:15 and Crystal is aware.

## 2018-06-19 NOTE — Telephone Encounter (Signed)
Crystal the nurse at [facility] called stating that patient has an area on her left lower leg that needs attention. Crystal stated that the area is black, painful, red and swollen and  under the skin is soft to the touch. Crystal stated that the patient did this last week and she was not aware of it until today. Crystal stated that the wound is not opened. Crystal stated that she feels that the patient needs an antibiotic and some wound care, but she is not sure what to do.  No appointments available at the office today. Pharmacy [facility]/ [address]

## 2018-06-20 ENCOUNTER — Encounter: Payer: Self-pay | Admitting: Family Medicine

## 2018-06-20 ENCOUNTER — Ambulatory Visit (INDEPENDENT_AMBULATORY_CARE_PROVIDER_SITE_OTHER): Payer: Medicare Other | Admitting: Family Medicine

## 2018-06-20 VITALS — BP 126/74 | HR 75 | Temp 97.8°F | Ht 60.5 in | Wt 126.5 lb

## 2018-06-20 DIAGNOSIS — I10 Essential (primary) hypertension: Secondary | ICD-10-CM | POA: Diagnosis not present

## 2018-06-20 DIAGNOSIS — S81802A Unspecified open wound, left lower leg, initial encounter: Secondary | ICD-10-CM | POA: Diagnosis not present

## 2018-06-20 MED ORDER — LISINOPRIL 5 MG PO TABS: 5.0000 mg | ORAL_TABLET | ORAL | 1 refills | Status: DC

## 2018-06-20 NOTE — Assessment & Plan Note (Signed)
Skin well approximated. Dark area likely just bruising of skin, not necrosis. Will continue doxy course for mild surrounding erythema. continue twice daily mupirocin topically. No need for wound clinic eval at this time unless deterioration noted. RTC 1 wk f/u visit.

## 2018-06-20 NOTE — Progress Notes (Signed)
BP 126/74 (BP Location: Left Arm, Patient Position: Sitting, Cuff Size: Normal)   Pulse 75   Temp 97.8 F (36.6 C) (Oral)   Ht 5' 0.5" (1.537 m)   Wt 126 lb 8 oz (57.4 kg)   SpO2 97%   BMI 24.30 kg/m    CC: leg wound "I fell" Subjective:    Patient ID: Teresa Meyer, female    DOB: [birth year], 85 y.o.   MRN: [id number]  HPI: Teresa Meyer is a 85 y.o. female presenting on 06/20/2018 for Leg Wound (Has wound on lower medial left leg. [place] thinks she needs to wound clinic. )   See recent [facility] phone note.   DOI: sometime last week.  Slipped at home landed on chair. Did cut skin with mild bleeding, but skin does cover wound. She cleaned with alcohol on day of injury.  Large tender dark area media leg just below knee. Has been treating with mupirocin cream and covering with bandage. No further bleeding.   We started doxycycline yesterday - tolerating this well.   She has continued to decrease lisinopril to 5mg  three times a week. Took down due to diarrhea side effect concern - with improvement.      Relevant past medical, surgical, family and social history reviewed and updated as indicated. Interim medical history since our last visit reviewed. Allergies and medications reviewed and updated. Outpatient Medications Prior to Visit  Medication Sig Dispense Refill  . calcium carbonate (TUMS - DOSED IN MG ELEMENTAL CALCIUM) 500 MG chewable tablet Chew 1 tablet by mouth 2 (two) times daily with a meal.    . cholecalciferol (VITAMIN D) 1000 UNITS tablet Take 1,000 Units by mouth daily.    . collagenase (SANTYL) ointment Apply 1 application topically daily. 30 g 0  . diphenhydramine-acetaminophen (TYLENOL PM) 25-500 MG TABS Take 2 tablets by mouth at bedtime as needed.    . doxycycline (VIBRA-TABS) 100 MG tablet Take 1 tablet (100 mg total) by mouth 2 (two) times daily. 14 tablet 0  . ferrous sulfate 325 (65 FE) MG tablet Take 1 tablet (325 mg total) by mouth once a  week. (Patient taking differently: Take 325 mg by mouth daily with breakfast. Takes daily)    . prednisoLONE acetate (PRED MILD) 0.12 % ophthalmic suspension Place 1 drop into the left eye daily.     . vitamin B-12 (CYANOCOBALAMIN) 1000 MCG tablet Take 1 tablet (1,000 mcg total) by mouth 2 (two) times a week.    . vitamin C (ASCORBIC ACID) 250 MG tablet Take 1 tablet (250 mg total) by mouth daily. 90 tablet 0  . lisinopril (PRINIVIL,ZESTRIL) 5 MG tablet Take 1 tablet (5 mg total) by mouth daily. 90 tablet 1   No facility-administered medications prior to visit.      Per HPI unless specifically indicated in ROS section below Review of Systems Objective:    BP 126/74 (BP Location: Left Arm, Patient Position: Sitting, Cuff Size: Normal)   Pulse 75   Temp 97.8 F (36.6 C) (Oral)   Ht 5' 0.5" (1.537 m)   Wt 126 lb 8 oz (57.4 kg)   SpO2 97%   BMI 24.30 kg/m   Wt Readings from Last 3 Encounters:  06/20/18 126 lb 8 oz (57.4 kg)  02/09/18 121 lb (54.9 kg)  12/13/17 120 lb 14.4 oz (54.8 kg)    Physical Exam Vitals signs and nursing note reviewed.  Constitutional:      General: She  is not in acute distress.    Appearance: Normal appearance.  Musculoskeletal:     Right lower leg: Edema (tr) present.     Left lower leg: Edema (tr) present.  Skin:    General: Skin is warm and dry.     Findings: Erythema and wound present.          Comments: 6x4.5cm diamond shaped superficial wound L medial leg just below knee that started as skin flap but skin remains well approximated covering entire wound. Mild surrounding erythema without drainage or bleeding. See photo.   Neurological:     Mental Status: She is alert.          Assessment & Plan:   Problem List Items Addressed This Visit    Wound of left leg, initial encounter - Primary    Skin well approximated. Dark area likely just bruising of skin, not necrosis. Will continue doxy course for mild surrounding erythema. continue twice  daily mupirocin topically. No need for wound clinic eval at this time unless deterioration noted. RTC 1 wk f/u visit.       Essential hypertension    Doing well with lisinopril 5mg  3 times weekly       Relevant Medications   lisinopril (PRINIVIL,ZESTRIL) 5 MG tablet (Start on 06/21/2018)       Meds ordered this encounter  Medications  . lisinopril (PRINIVIL,ZESTRIL) 5 MG tablet    Sig: Take 1 tablet (5 mg total) by mouth 3 (three) times a week.    Dispense:  30 tablet    Refill:  1   No orders of the defined types were placed in this encounter.   Follow up plan: No follow-ups on file.  [name], MD

## 2018-06-20 NOTE — Patient Instructions (Addendum)
You do have large wound to left leg but skin seems to be covering this well. For redness around wound, take doxycycline antibiotic course. Continue dressing wound with mupirocin cream twice daily.  Return in 1 week for recheck.

## 2018-06-20 NOTE — Assessment & Plan Note (Signed)
Doing well with lisinopril 5mg  3 times weekly

## 2018-06-27 ENCOUNTER — Ambulatory Visit: Payer: Self-pay | Admitting: Family Medicine

## 2018-06-28 ENCOUNTER — Encounter: Payer: Self-pay | Admitting: Family Medicine

## 2018-06-28 ENCOUNTER — Ambulatory Visit (INDEPENDENT_AMBULATORY_CARE_PROVIDER_SITE_OTHER): Payer: Medicare Other | Admitting: Family Medicine

## 2018-06-28 VITALS — BP 124/80 | HR 76 | Temp 97.8°F | Ht 60.5 in

## 2018-06-28 DIAGNOSIS — I10 Essential (primary) hypertension: Secondary | ICD-10-CM | POA: Diagnosis not present

## 2018-06-28 DIAGNOSIS — S81802D Unspecified open wound, left lower leg, subsequent encounter: Secondary | ICD-10-CM

## 2018-06-28 DIAGNOSIS — I872 Venous insufficiency (chronic) (peripheral): Secondary | ICD-10-CM | POA: Diagnosis not present

## 2018-06-28 NOTE — Progress Notes (Signed)
BP 124/80 (BP Location: Left Arm, Patient Position: Sitting, Cuff Size: Normal)   Pulse 76   Temp 97.8 F (36.6 C) (Oral)   Ht 5' 0.5" (1.537 m)   SpO2 96%   BMI 24.30 kg/m    CC: leg wound f/u visit Subjective:    Patient ID: Teresa Meyer, female    DOB: 04-Mar-1923, 85 y.o.   MRN: [id number]  HPI: Teresa Meyer is a 85 y.o. female presenting on 06/28/2018 for Wound Check (Here for 1 wk f/u of left leg. Pt accompanied by her aide, [name].)    See prior note for details.  Here for wound check.  Completed doxycycline course. Also used mupirocin topically BID. Overall feeling well. Denies pain or pruritis at wound site.       Relevant past medical, surgical, family and social history reviewed and updated as indicated. Interim medical history since our last visit reviewed. Allergies and medications reviewed and updated. Outpatient Medications Prior to Visit  Medication Sig Dispense Refill  . calcium carbonate (TUMS - DOSED IN MG ELEMENTAL CALCIUM) 500 MG chewable tablet Chew 1 tablet by mouth 2 (two) times daily with a meal.    . cholecalciferol (VITAMIN D) 1000 UNITS tablet Take 1,000 Units by mouth daily.    . collagenase (SANTYL) ointment Apply 1 application topically daily. 30 g 0  . diphenhydramine-acetaminophen (TYLENOL PM) 25-500 MG TABS Take 2 tablets by mouth at bedtime as needed.    . doxycycline (VIBRA-TABS) 100 MG tablet Take 1 tablet (100 mg total) by mouth 2 (two) times daily. 14 tablet 0  . ferrous sulfate 325 (65 FE) MG tablet Take 1 tablet (325 mg total) by mouth once a week. (Patient taking differently: Take 325 mg by mouth daily with breakfast. Takes daily)    . prednisoLONE acetate (PRED MILD) 0.12 % ophthalmic suspension Place 1 drop into the left eye daily.     . vitamin B-12 (CYANOCOBALAMIN) 1000 MCG tablet Take 1 tablet (1,000 mcg total) by mouth 2 (two) times a week.    . vitamin C (ASCORBIC ACID) 250 MG tablet Take 1 tablet (250 mg total) by mouth daily. 90  tablet 0  . lisinopril (PRINIVIL,ZESTRIL) 5 MG tablet Take 1 tablet (5 mg total) by mouth 3 (three) times a week. 30 tablet 1   No facility-administered medications prior to visit.      Per HPI unless specifically indicated in ROS section below Review of Systems Objective:    BP 124/80 (BP Location: Left Arm, Patient Position: Sitting, Cuff Size: Normal)   Pulse 76   Temp 97.8 F (36.6 C) (Oral)   Ht 5' 0.5" (1.537 m)   SpO2 96%   BMI 24.30 kg/m   Wt Readings from Last 3 Encounters:  06/20/18 126 lb 8 oz (57.4 kg)  02/09/18 121 lb (54.9 kg)  12/13/17 120 lb 14.4 oz (54.8 kg)    Physical Exam Vitals signs and nursing note reviewed.  Musculoskeletal: Normal range of motion.     Right lower leg: Edema (tr) present.     Left lower leg: Edema (1+) present.  Skin:    Findings: Erythema (mild surrounding) and wound present.     Comments: 6x4cm diamond shaped superficial wound L medial leg just below knee with dry blood, skin covers entire wound. Some areas of healthy skin in wound. Mild surrounding erythema without drainage or bleeding. See photo.            Assessment & Plan:  Problem List Items Addressed This Visit    Wound of left leg, subsequent encounter - Primary    Wound is slowly contracting, no spreading redness. Will hold abx at this time but low threshold to cover strep with augmentin or cephalosporin. Wound well covered, no need for wound clinic at this time. Pt agrees with plan.  RTC 1 wk close f/u.       Essential hypertension    bp stable on lisinopril a few times a week. Pt would like to come off this medication - feels it is causing diarrhea. Will trial off, advised to monitor BP at home and let me know if trending up.      Chronic venous insufficiency       No orders of the defined types were placed in this encounter.  No orders of the defined types were placed in this encounter.   Follow up plan: Return in about 1 week (around 07/05/2018) for  follow up visit.  [name], MD

## 2018-06-28 NOTE — Assessment & Plan Note (Signed)
bp stable on lisinopril a few times a week. Pt would like to come off this medication - feels it is causing diarrhea. Will trial off, advised to monitor BP at home and let me know if trending up.

## 2018-06-28 NOTE — Assessment & Plan Note (Signed)
Wound is slowly contracting, no spreading redness. Will hold abx at this time but low threshold to cover strep with augmentin or cephalosporin. Wound well covered, no need for wound clinic at this time. Pt agrees with plan.  RTC 1 wk close f/u.

## 2018-06-28 NOTE — Patient Instructions (Addendum)
Ok to stay off lisinopril but monitor blood pressures once a week and let me know if creeping up.  Wound is a bit smaller than previously. We can keep watching here. Let me know if fever or streaking redness. Return in 1 week for follow up visit.

## 2018-07-05 ENCOUNTER — Ambulatory Visit (INDEPENDENT_AMBULATORY_CARE_PROVIDER_SITE_OTHER): Payer: Medicare Other | Admitting: Family Medicine

## 2018-07-05 ENCOUNTER — Encounter: Payer: Self-pay | Admitting: Family Medicine

## 2018-07-05 VITALS — BP 124/70 | HR 70 | Temp 97.6°F | Ht 63.0 in | Wt 126.2 lb

## 2018-07-05 DIAGNOSIS — S81802D Unspecified open wound, left lower leg, subsequent encounter: Secondary | ICD-10-CM

## 2018-07-05 NOTE — Progress Notes (Signed)
BP 124/70 (BP Location: Left Arm, Patient Position: Sitting, Cuff Size: Normal)   Pulse 70   Temp 97.6 F (36.4 C) (Oral)   Ht 5\' 3"  (1.6 m)   Wt 126 lb 3 oz (57.2 kg)   SpO2 96%   BMI 22.35 kg/m    CC: wound f/u visit Subjective:    Patient ID: Teresa Meyer, female    DOB: 1923/03/13, 85 y.o.   MRN: [id number]  HPI: Teresa Meyer is a 85 y.o. female presenting on 07/05/2018 for Wound Check (Here for 1 wk f/u.  Pt accompanied by her aide, [name]. )   See prior notes for details.  Off abx for the past week.  L wound continues healing. No pain, no itching, no spreading redness.   Lisinopril stopped last visit - had only been taking a few times a week. Blood pressure remains stable.      Relevant past medical, surgical, family and social history reviewed and updated as indicated. Interim medical history since our last visit reviewed. Allergies and medications reviewed and updated. Outpatient Medications Prior to Visit  Medication Sig Dispense Refill  . calcium carbonate (TUMS - DOSED IN MG ELEMENTAL CALCIUM) 500 MG chewable tablet Chew 1 tablet by mouth 2 (two) times daily with a meal.    . cholecalciferol (VITAMIN D) 1000 UNITS tablet Take 1,000 Units by mouth daily.    . collagenase (SANTYL) ointment Apply 1 application topically daily. 30 g 0  . diphenhydramine-acetaminophen (TYLENOL PM) 25-500 MG TABS Take 2 tablets by mouth at bedtime as needed.    . doxycycline (VIBRA-TABS) 100 MG tablet Take 1 tablet (100 mg total) by mouth 2 (two) times daily. 14 tablet 0  . ferrous sulfate 325 (65 FE) MG tablet Take 1 tablet (325 mg total) by mouth once a week. (Patient taking differently: Take 325 mg by mouth daily with breakfast. Takes daily)    . prednisoLONE acetate (PRED MILD) 0.12 % ophthalmic suspension Place 1 drop into the left eye daily.     . vitamin B-12 (CYANOCOBALAMIN) 1000 MCG tablet Take 1 tablet (1,000 mcg total) by mouth 2 (two) times a week.    . vitamin C (ASCORBIC  ACID) 250 MG tablet Take 1 tablet (250 mg total) by mouth daily. 90 tablet 0   No facility-administered medications prior to visit.      Per HPI unless specifically indicated in ROS section below Review of Systems Objective:    BP 124/70 (BP Location: Left Arm, Patient Position: Sitting, Cuff Size: Normal)   Pulse 70   Temp 97.6 F (36.4 C) (Oral)   Ht 5\' 3"  (1.6 m)   Wt 126 lb 3 oz (57.2 kg)   SpO2 96%   BMI 22.35 kg/m   Wt Readings from Last 3 Encounters:  07/05/18 126 lb 3 oz (57.2 kg)  06/20/18 126 lb 8 oz (57.4 kg)  02/09/18 121 lb (54.9 kg)    Physical Exam Vitals signs and nursing note reviewed.  Musculoskeletal: Normal range of motion.     Right lower leg: Edema (tr) present.     Left lower leg: Edema (1+) present.  Skin:    Findings: Erythema (mild surrounding) and wound present.     Comments: 6x4cm diamond shaped superficial wound L medial leg just below knee with dry blood, skin covers entire wound. Growing areas of healthy skin in wound. Mild surrounding erythema without drainage or bleeding. See photo.  Assessment & Plan:   Problem List Items Addressed This Visit    Wound of left leg, subsequent encounter    Continues healing well slowly contracting. Continue to monitor. RTC 2 wks wound check. Pt agrees with plan.           No orders of the defined types were placed in this encounter.  No orders of the defined types were placed in this encounter.   Follow up plan: Return in about 2 weeks (around 07/19/2018) for follow up visit.  [name], MD

## 2018-07-05 NOTE — Assessment & Plan Note (Signed)
Continues healing well slowly contracting. Continue to monitor. RTC 2 wks wound check. Pt agrees with plan.

## 2018-07-05 NOTE — Patient Instructions (Signed)
Wound is slowly healing well - continue wound care as up to now. Return in 2 weeks for recheck. Let us know sooner if fever, warmth, spreading redness, draining pus, or worsening pain.

## 2018-07-21 ENCOUNTER — Encounter: Payer: Self-pay | Admitting: Family Medicine

## 2018-07-21 ENCOUNTER — Ambulatory Visit (INDEPENDENT_AMBULATORY_CARE_PROVIDER_SITE_OTHER): Payer: Medicare Other | Admitting: Family Medicine

## 2018-07-21 VITALS — BP 124/64 | HR 65 | Temp 97.6°F | Ht 63.0 in | Wt 128.1 lb

## 2018-07-21 DIAGNOSIS — S81802D Unspecified open wound, left lower leg, subsequent encounter: Secondary | ICD-10-CM

## 2018-07-21 NOTE — Progress Notes (Signed)
BP 124/64 (BP Location: Left Arm, Patient Position: Sitting, Cuff Size: Normal)   Pulse 65   Temp 97.6 F (36.4 C) (Oral)   Ht 5\' 3"  (1.6 m)   Wt 128 lb 2 oz (58.1 kg)   SpO2 97%   BMI 22.70 kg/m    CC: wound check Subjective:    Patient ID: Teresa Meyer, female    DOB: 08-10-1922, 85 y.o.   MRN: [id number]  HPI: SRINIKA DELONE is a 85 y.o. female presenting on 07/21/2018 for Wound Check (Here for 2 wk wound chk of left leg. Pt accompanied by her aide, [name]. )    See prior notes for details.  L wound continues healing. No pain, no spreading redness.  Some itching to wound.      Relevant past medical, surgical, family and social history reviewed and updated as indicated. Interim medical history since our last visit reviewed. Allergies and medications reviewed and updated. Outpatient Medications Prior to Visit  Medication Sig Dispense Refill  . calcium carbonate (TUMS - DOSED IN MG ELEMENTAL CALCIUM) 500 MG chewable tablet Chew 1 tablet by mouth 2 (two) times daily with a meal.    . cholecalciferol (VITAMIN D) 1000 UNITS tablet Take 1,000 Units by mouth daily.    . collagenase (SANTYL) ointment Apply 1 application topically daily. 30 g 0  . diphenhydramine-acetaminophen (TYLENOL PM) 25-500 MG TABS Take 2 tablets by mouth at bedtime as needed.    . doxycycline (VIBRA-TABS) 100 MG tablet Take 1 tablet (100 mg total) by mouth 2 (two) times daily. 14 tablet 0  . ferrous sulfate 325 (65 FE) MG tablet Take 1 tablet (325 mg total) by mouth once a week. (Patient taking differently: Take 325 mg by mouth daily with breakfast. Takes daily)    . prednisoLONE acetate (PRED MILD) 0.12 % ophthalmic suspension Place 1 drop into the left eye daily.     . vitamin B-12 (CYANOCOBALAMIN) 1000 MCG tablet Take 1 tablet (1,000 mcg total) by mouth 2 (two) times a week.    . vitamin C (ASCORBIC ACID) 250 MG tablet Take 1 tablet (250 mg total) by mouth daily. 90 tablet 0   No facility-administered  medications prior to visit.      Per HPI unless specifically indicated in ROS section below Review of Systems Objective:    BP 124/64 (BP Location: Left Arm, Patient Position: Sitting, Cuff Size: Normal)   Pulse 65   Temp 97.6 F (36.4 C) (Oral)   Ht 5\' 3"  (1.6 m)   Wt 128 lb 2 oz (58.1 kg)   SpO2 97%   BMI 22.70 kg/m   Wt Readings from Last 3 Encounters:  07/21/18 128 lb 2 oz (58.1 kg)  07/05/18 126 lb 3 oz (57.2 kg)  06/20/18 126 lb 8 oz (57.4 kg)    Physical Exam Vitals signs and nursing note reviewed.  Musculoskeletal: Normal range of motion.     Right lower leg: Edema (tr) present.     Left lower leg: Edema (1+) present.  Skin:    Findings: Wound present. No erythema.     Comments: Significant improvement in wound L medial leg just below knee with scab, small open areas present in wound. Refer to picture.             Assessment & Plan:   Problem List Items Addressed This Visit    Wound of left leg, subsequent encounter    Significant improvement. Now some open areas of wound.  Discussed wound care - wash daily with soap and water, place triple abx ointment, cover with bandage if draining.  RTC 1 mo f/u visit, sooner if signs of infection.           No orders of the defined types were placed in this encounter.  No orders of the defined types were placed in this encounter.   Follow up plan: No follow-ups on file.  [name], MD

## 2018-07-21 NOTE — Assessment & Plan Note (Addendum)
Significant improvement. Now some open areas of wound. Discussed wound care - wash daily with soap and water, place triple abx ointment, cover with bandage if draining.  RTC 1 mo f/u visit, sooner if signs of infection.

## 2018-07-21 NOTE — Patient Instructions (Signed)
Wash daily with soap and water.  Continue antibiotic ointment and bandaid change daily if draining.  Return if not continuing to improve.

## 2018-08-11 ENCOUNTER — Telehealth: Payer: Self-pay | Admitting: Family Medicine

## 2018-08-11 NOTE — Telephone Encounter (Signed)
Pt r/s to 6/1 she had a question and would like someone to call.  She has ? About her wound but it is doing better  Best number [phone]

## 2018-08-11 NOTE — Telephone Encounter (Signed)
Patient says her wound is healing nicely but she has a little raised spot about 1/2 inch below the wound and she wonders what that is.  Patient says you saw it at her last OV.  This spot is not red or open but is a little tender.

## 2018-08-12 NOTE — Telephone Encounter (Signed)
I think it's just scar tissue from wound - if enlarging let us know and we may bring her in sooner.

## 2018-08-14 ENCOUNTER — Ambulatory Visit: Payer: Medicare Other | Admitting: Family Medicine

## 2018-08-14 NOTE — Telephone Encounter (Signed)
Spoke with pt relaying Dr. Synthia Innocent message.  Pt verbalizes understanding. However, she recently noticed a little blister beside the scab. Wondering should she be concerned.

## 2018-08-14 NOTE — Telephone Encounter (Signed)
Spoke with pt relaying Dr. Synthia Innocent message.  Pt verbalizes understanding and again expresses her thanks.

## 2018-08-14 NOTE — Telephone Encounter (Signed)
We can watch. If enlarging or symptomatic, may schedule appt to come in.

## 2018-08-16 NOTE — Telephone Encounter (Signed)
Patient called back and said the nurse at [facility] told her Dr.G needs to see her wound.  Should patient come in to the office for an office visit?  Patient's phone number is [phone].

## 2018-08-17 ENCOUNTER — Encounter: Payer: Self-pay | Admitting: Family Medicine

## 2018-08-17 ENCOUNTER — Ambulatory Visit (INDEPENDENT_AMBULATORY_CARE_PROVIDER_SITE_OTHER): Payer: Medicare Other | Admitting: Family Medicine

## 2018-08-17 ENCOUNTER — Other Ambulatory Visit: Payer: Self-pay

## 2018-08-17 VITALS — BP 124/80 | HR 70 | Temp 97.6°F | Ht 63.0 in | Wt 126.3 lb

## 2018-08-17 DIAGNOSIS — L989 Disorder of the skin and subcutaneous tissue, unspecified: Secondary | ICD-10-CM

## 2018-08-17 DIAGNOSIS — S81802D Unspecified open wound, left lower leg, subsequent encounter: Secondary | ICD-10-CM | POA: Diagnosis not present

## 2018-08-17 DIAGNOSIS — D492 Neoplasm of unspecified behavior of bone, soft tissue, and skin: Secondary | ICD-10-CM

## 2018-08-17 HISTORY — DX: Disorder of the skin and subcutaneous tissue, unspecified: L98.9

## 2018-08-17 MED ORDER — DOXYCYCLINE HYCLATE 100 MG PO TABS: 100.0000 mg | ORAL_TABLET | Freq: Two times a day (BID) | ORAL | 0 refills | Status: DC

## 2018-08-17 NOTE — Assessment & Plan Note (Addendum)
Continues healing well.  [facility] printed for doxycycline 7d course with indications when to fill ie spreading erythema.

## 2018-08-17 NOTE — Patient Instructions (Addendum)
Wound is looking good! Continue antibiotic cream.  If any spreading redness, start antibiotic printed out today. Watch skin growth, if enlarging, let me know for sooner appointment with Dr Nehemiah Massed.  Ok to stop vitamin B12.

## 2018-08-17 NOTE — Assessment & Plan Note (Addendum)
New growth of unclear cause - ?keratoacanthoma vs cyst vs other. Will monitor for now, if enlarging will let me know for sooner referral to Dr Nehemiah Massed derm for evaluation.

## 2018-08-17 NOTE — Progress Notes (Signed)
BP 124/80 (BP Location: Left Arm, Patient Position: Sitting, Cuff Size: Normal)    Pulse 70    Temp 97.6 F (36.4 C) (Oral)    Ht 5\' 3"  (1.6 m)    Wt 126 lb 5 oz (57.3 kg)    BMI 22.38 kg/m    CC: wound check Subjective:    Patient ID: [name], female    DOB: Aug 22, 1922, 85 y.o.   MRN: [id number]  HPI: HERLINDA HEADY is a 85 y.o. female presenting on 08/17/2018 for Wound Check (Here for f/u of left leg wound. )    See prior notes for details.  [place] advised pt come in to have leg wound evaluated.       Relevant past medical, surgical, family and social history reviewed and updated as indicated. Interim medical history since our last visit reviewed. Allergies and medications reviewed and updated. Outpatient Medications Prior to Visit  Medication Sig Dispense Refill   calcium carbonate (TUMS - DOSED IN MG ELEMENTAL CALCIUM) 500 MG chewable tablet Chew 1 tablet by mouth 2 (two) times daily with a meal.     cholecalciferol (VITAMIN D) 1000 UNITS tablet Take 1,000 Units by mouth daily.     collagenase (SANTYL) ointment Apply 1 application topically daily. 30 g 0   diphenhydramine-acetaminophen (TYLENOL PM) 25-500 MG TABS Take 2 tablets by mouth at bedtime as needed.     ferrous sulfate 325 (65 FE) MG tablet Take 1 tablet (325 mg total) by mouth once a week. (Patient taking differently: Take 325 mg by mouth daily with breakfast. Takes daily)     prednisoLONE acetate (PRED MILD) 0.12 % ophthalmic suspension Place 1 drop into the left eye daily.      doxycycline (VIBRA-TABS) 100 MG tablet Take 1 tablet (100 mg total) by mouth 2 (two) times daily. 14 tablet 0   vitamin B-12 (CYANOCOBALAMIN) 1000 MCG tablet Take 1 tablet (1,000 mcg total) by mouth 2 (two) times a week.     vitamin C (ASCORBIC ACID) 250 MG tablet Take 1 tablet (250 mg total) by mouth daily. 90 tablet 0   No facility-administered medications prior to visit.      Per HPI unless specifically indicated in  ROS section below Review of Systems Objective:    BP 124/80 (BP Location: Left Arm, Patient Position: Sitting, Cuff Size: Normal)    Pulse 70    Temp 97.6 F (36.4 C) (Oral)    Ht 5\' 3"  (1.6 m)    Wt 126 lb 5 oz (57.3 kg)    BMI 22.38 kg/m   Wt Readings from Last 3 Encounters:  08/17/18 126 lb 5 oz (57.3 kg)  07/21/18 128 lb 2 oz (58.1 kg)  07/05/18 126 lb 3 oz (57.2 kg)    Physical Exam Vitals signs and nursing note reviewed.  Constitutional:      Appearance: Normal appearance. She is not ill-appearing.  Musculoskeletal:     Right lower leg: Edema (tr) present.     Left lower leg: Edema (tr) present.  Skin:    General: Skin is warm and dry.     Comments: See attached picture Continued healing of leg wound New dome shaped firm tender skin growth medial to wound without drainage or erythema  Neurological:     Mental Status: She is alert.           Lab Results  Component Value Date   VITAMINB12 2,636 (H) 10/11/2017    Lab Results  Component Value Date   CREATININE 0.81 10/12/2017   BUN 12 10/12/2017   NA 130 (L) 10/12/2017   K 3.6 10/12/2017   CL 100 (L) 10/12/2017   CO2 23 10/12/2017    Assessment & Plan:   Problem List Items Addressed This Visit    Wound of left leg, subsequent encounter    Continues healing well.  WASP printed for doxycycline 7d course with indications when to fill ie spreading erythema.      Skin growth - Primary    New growth of unclear cause - ?keratoacanthoma vs cyst vs other. Will monitor for now, if enlarging will let me know for sooner referral to Dr Nehemiah Massed derm for evaluation.           Meds ordered this encounter  Medications   doxycycline (VIBRA-TABS) 100 MG tablet    Sig: Take 1 tablet (100 mg total) by mouth 2 (two) times daily.    Dispense:  14 tablet    Refill:  0   No orders of the defined types were placed in this encounter.   Follow up plan: No follow-ups on file.  [name], MD

## 2018-08-17 NOTE — Telephone Encounter (Signed)
Appointment 12:15 3/26

## 2018-08-17 NOTE — Telephone Encounter (Signed)
Left message asking pt to call office  °

## 2018-08-17 NOTE — Telephone Encounter (Signed)
Yes may schedule office visit for today.

## 2018-08-21 ENCOUNTER — Telehealth: Payer: Self-pay | Admitting: Family Medicine

## 2018-08-21 DIAGNOSIS — D492 Neoplasm of unspecified behavior of bone, soft tissue, and skin: Secondary | ICD-10-CM

## 2018-08-21 NOTE — Telephone Encounter (Signed)
Patient was seen today at 11:30 by Dr Nehemiah Massed and has a followup appt on 12/06/18 at 2:30pm. Daughter called and given this information.

## 2018-08-21 NOTE — Telephone Encounter (Signed)
Referral placed. Unclear if she has appt scheduled or wants for today - but will likely not be able to schedule so soon.

## 2018-08-21 NOTE — Telephone Encounter (Signed)
Pt stating she need an appt with Dr Nehemiah Massed the skin specialist for 12 o'clock today. Pt stated [name] stated if she need an appt to call us and we would get it scheduled for her

## 2018-10-23 ENCOUNTER — Encounter: Payer: Self-pay | Admitting: Family Medicine

## 2018-10-23 ENCOUNTER — Ambulatory Visit (INDEPENDENT_AMBULATORY_CARE_PROVIDER_SITE_OTHER): Payer: Medicare Other | Admitting: Family Medicine

## 2018-10-23 VITALS — Ht 63.0 in

## 2018-10-23 DIAGNOSIS — D539 Nutritional anemia, unspecified: Secondary | ICD-10-CM | POA: Diagnosis not present

## 2018-10-23 DIAGNOSIS — Z66 Do not resuscitate: Secondary | ICD-10-CM

## 2018-10-23 DIAGNOSIS — M8580 Other specified disorders of bone density and structure, unspecified site: Secondary | ICD-10-CM

## 2018-10-23 DIAGNOSIS — D509 Iron deficiency anemia, unspecified: Secondary | ICD-10-CM

## 2018-10-23 DIAGNOSIS — K219 Gastro-esophageal reflux disease without esophagitis: Secondary | ICD-10-CM

## 2018-10-23 DIAGNOSIS — I872 Venous insufficiency (chronic) (peripheral): Secondary | ICD-10-CM

## 2018-10-23 MED ORDER — FERROUS SULFATE 325 (65 FE) MG PO TABS: 325.0000 mg | ORAL_TABLET | ORAL | Status: DC

## 2018-10-23 NOTE — Assessment & Plan Note (Signed)
Managed with vit D daily and PRN tums

## 2018-10-23 NOTE — Progress Notes (Signed)
   Teresa Meyer - 85 y.o. female  MRN [id number]  Date of Birth: 11/04/1922  PCP: [name], MD  This service was provided via telemedicine. Phone Visit performed on 10/23/2018    Rationale for phone visit along with limitations reviewed. Patient consented to telephone encounter.    Location of patient: home Location of provider: in office, [place] [facility] Name of referring provider: N/A   Names of persons and role in encounter: Provider: [name], MD  Patient: Teresa Meyer  Other: N/A   Time on call: 11:56am - 12:08pm   Subjective: Chief Complaint  Patient presents with  . Follow-up    6 mo f/u.     HPI:  She is looking forward to football season starting again.   Leg wound - healed well. She did have lateral lump and saw Dr Nehemiah Massed. S/p benign biopsy. Biopsy has healed well.   IDA ?MDS - seen by hematology, declined iron infusion, taking oral iron twice weekly without vit C. Declines follow up at this time.     Objective/Observations:   No physical exam or vital signs collected unless specifically identified below.   Ht 5\' 3"  (1.6 m)   BMI 22.38 kg/m   Wt Readings from Last 3 Encounters:  08/17/18 126 lb 5 oz (57.3 kg)  07/21/18 128 lb 2 oz (58.1 kg)  07/05/18 126 lb 3 oz (57.2 kg)    Respiratory status: speaks in complete sentences without evident shortness of breath.   Assessment/Plan:  Macrocytic anemia Only taking iron twice weekly, not taking with vit C. States higher iron dose causes diarrhea and GI upset. Will send requisition slip for labwork to [facility] and pt will see if it can be drawn there. Otherwise would offer curbside labs at our office.   GERD Managed with PRN tums.   Osteopenia Managed with vit D daily and PRN tums   I discussed the assessment and treatment plan with the patient. The patient was provided an opportunity to ask questions and all were answered. The patient agreed with the plan and  demonstrated an understanding of the instructions.  Lab Orders  No laboratory test(s) ordered today    Meds ordered this encounter  Medications  . ferrous sulfate 325 (65 FE) MG tablet    Sig: Take 1 tablet (325 mg total) by mouth 2 (two) times a week.    The patient was advised to call back or seek an in-person evaluation if the symptoms worsen or if the condition fails to improve as anticipated.  [name], MD

## 2018-10-23 NOTE — Assessment & Plan Note (Addendum)
Only taking iron twice weekly, not taking with vit C. States higher iron dose causes diarrhea and GI upset. Will send requisition slip for labwork to [facility] and pt will see if it can be drawn there. Otherwise would offer curbside labs at our office.

## 2018-10-23 NOTE — Assessment & Plan Note (Signed)
Managed with PRN tums.

## 2018-10-27 LAB — HEPATIC FUNCTION PANEL
ALT: 10 (ref 7–35)
AST: 19 (ref 13–35)
Alkaline Phosphatase: 55 (ref 25–125)
Bilirubin, Total: 0.5

## 2018-10-27 LAB — BASIC METABOLIC PANEL
Creatinine: 0.9 (ref 0.5–1.1)
Glucose: 89
Potassium: 3.7 (ref 3.4–5.3)

## 2018-10-27 LAB — CBC AND DIFFERENTIAL
Hemoglobin: 13.1 (ref 12.0–16.0)
Platelets: 250 (ref 150–399)
WBC: 4.2

## 2018-10-27 LAB — IRON,TIBC AND FERRITIN PANEL
Ferritin: 90
Iron: 89

## 2018-11-03 ENCOUNTER — Telehealth: Payer: Self-pay | Admitting: Family Medicine

## 2018-11-03 NOTE — Telephone Encounter (Signed)
Labs in Teresa Meyer's box. plz notify labs returned reassuringly ok - liver, sugar, blood counts ,and iron levels. Continue iron supplement twice weekly.  Kidneys slightly impaired but stable. Ensure good hydration status with plenty of water.

## 2018-11-06 ENCOUNTER — Encounter: Payer: Self-pay | Admitting: Family Medicine

## 2018-11-06 NOTE — Telephone Encounter (Addendum)
Abstracted labs.    Left message on vm per dpr relaying lab results and instructions per Dr. Darnell Level.

## 2019-03-22 ENCOUNTER — Other Ambulatory Visit: Payer: Self-pay

## 2019-03-22 ENCOUNTER — Ambulatory Visit: Payer: Medicare Other

## 2019-03-22 DIAGNOSIS — Z23 Encounter for immunization: Secondary | ICD-10-CM | POA: Diagnosis not present

## 2019-04-18 ENCOUNTER — Other Ambulatory Visit: Payer: Self-pay

## 2019-04-23 ENCOUNTER — Ambulatory Visit: Payer: Medicare Other | Admitting: Family Medicine

## 2019-04-25 ENCOUNTER — Ambulatory Visit (INDEPENDENT_AMBULATORY_CARE_PROVIDER_SITE_OTHER): Payer: Medicare Other | Admitting: Family Medicine

## 2019-04-25 ENCOUNTER — Other Ambulatory Visit: Payer: Self-pay

## 2019-04-25 ENCOUNTER — Encounter: Payer: Self-pay | Admitting: Family Medicine

## 2019-04-25 VITALS — BP 126/70 | HR 72 | Temp 97.7°F | Ht 61.0 in | Wt 132.0 lb

## 2019-04-25 DIAGNOSIS — D492 Neoplasm of unspecified behavior of bone, soft tissue, and skin: Secondary | ICD-10-CM | POA: Diagnosis not present

## 2019-04-25 DIAGNOSIS — Z0000 Encounter for general adult medical examination without abnormal findings: Secondary | ICD-10-CM

## 2019-04-25 DIAGNOSIS — M8580 Other specified disorders of bone density and structure, unspecified site: Secondary | ICD-10-CM

## 2019-04-25 DIAGNOSIS — D539 Nutritional anemia, unspecified: Secondary | ICD-10-CM

## 2019-04-25 DIAGNOSIS — E7800 Pure hypercholesterolemia, unspecified: Secondary | ICD-10-CM | POA: Diagnosis not present

## 2019-04-25 DIAGNOSIS — R011 Cardiac murmur, unspecified: Secondary | ICD-10-CM

## 2019-04-25 DIAGNOSIS — I872 Venous insufficiency (chronic) (peripheral): Secondary | ICD-10-CM

## 2019-04-25 DIAGNOSIS — Z85820 Personal history of malignant melanoma of skin: Secondary | ICD-10-CM

## 2019-04-25 DIAGNOSIS — D509 Iron deficiency anemia, unspecified: Secondary | ICD-10-CM

## 2019-04-25 DIAGNOSIS — S41101A Unspecified open wound of right upper arm, initial encounter: Secondary | ICD-10-CM

## 2019-04-25 DIAGNOSIS — I10 Essential (primary) hypertension: Secondary | ICD-10-CM

## 2019-04-25 DIAGNOSIS — C4330 Malignant melanoma of unspecified part of face: Secondary | ICD-10-CM

## 2019-04-25 DIAGNOSIS — K219 Gastro-esophageal reflux disease without esophagitis: Secondary | ICD-10-CM

## 2019-04-25 LAB — COMPREHENSIVE METABOLIC PANEL
ALT: 11 U/L (ref 0–35)
AST: 18 U/L (ref 0–37)
Albumin: 4.4 g/dL (ref 3.5–5.2)
Alkaline Phosphatase: 53 U/L (ref 39–117)
BUN: 14 mg/dL (ref 6–23)
CO2: 29 mEq/L (ref 19–32)
Calcium: 10.1 mg/dL (ref 8.4–10.5)
Chloride: 103 mEq/L (ref 96–112)
Creatinine, Ser: 0.82 mg/dL (ref 0.40–1.20)
GFR: 64.79 mL/min (ref >60.00)
Glucose, Bld: 99 mg/dL (ref 70–99)
Potassium: 4.2 mEq/L (ref 3.5–5.1)
Sodium: 138 mEq/L (ref 135–145)
Total Bilirubin: 0.6 mg/dL (ref 0.2–1.2)
Total Protein: 7 g/dL (ref 6.0–8.3)

## 2019-04-25 LAB — CBC WITH DIFFERENTIAL/PLATELET
Basophils Absolute: 0 10*3/uL (ref 0.0–0.1)
Basophils Relative: 0.6 % (ref 0.0–3.0)
Eosinophils Absolute: 0.2 10*3/uL (ref 0.0–0.7)
Eosinophils Relative: 4.5 % (ref 0.0–5.0)
HCT: 38.3 % (ref 36.0–46.0)
Hemoglobin: 12.9 g/dL (ref 12.0–15.0)
Lymphocytes Relative: 16.8 % (ref 12.0–46.0)
Lymphs Abs: 0.8 10*3/uL (ref 0.7–4.0)
MCHC: 33.7 g/dL (ref 30.0–36.0)
MCV: 102.3 fl — ABNORMAL HIGH (ref 78.0–100.0)
Monocytes Absolute: 0.5 10*3/uL (ref 0.1–1.0)
Monocytes Relative: 10 % (ref 3.0–12.0)
Neutro Abs: 3.4 10*3/uL (ref 1.4–7.7)
Neutrophils Relative %: 68.1 % (ref 43.0–77.0)
Platelets: 235 10*3/uL (ref 150.0–400.0)
RBC: 3.74 Mil/uL — ABNORMAL LOW (ref 3.87–5.11)
RDW: 12.5 % (ref 11.5–15.5)
WBC: 5 10*3/uL (ref 4.0–10.5)

## 2019-04-25 LAB — IBC PANEL
Iron: 89 ug/dL (ref 42–145)
Saturation Ratios: 31.5 % (ref 20.0–50.0)
Transferrin: 202 mg/dL — ABNORMAL LOW (ref 212.0–360.0)

## 2019-04-25 LAB — FERRITIN: Ferritin: 83.7 ng/mL (ref 10.0–291.0)

## 2019-04-25 NOTE — Assessment & Plan Note (Signed)
Update iron levels and CBC on twice weekly iron tablet.

## 2019-04-25 NOTE — Assessment & Plan Note (Signed)
Chronic, managed with PRN tums.

## 2019-04-25 NOTE — Patient Instructions (Addendum)
Blood work today If R arm wound not healing well over next 1-2 weeks follow up with Dr Nehemiah Massed.  Great to see you today! Return as needed or in 6 months for follow up visit.   Health Maintenance After Age 83 After age 44, you are at a higher risk for certain long-term diseases and infections as well as injuries from falls. Falls are a major cause of broken bones and head injuries in people who are older than age 65. Getting regular preventive care can help to keep you healthy and well. Preventive care includes getting regular testing and making lifestyle changes as recommended by your health care provider. Talk with your health care provider about:  Which screenings and tests you should have. A screening is a test that checks for a disease when you have no symptoms.  A diet and exercise plan that is right for you. What should I know about screenings and tests to prevent falls? Screening and testing are the best ways to find a health problem early. Early diagnosis and treatment give you the best chance of managing medical conditions that are common after age 65. Certain conditions and lifestyle choices may make you more likely to have a fall. Your health care provider may recommend:  Regular vision checks. Poor vision and conditions such as cataracts can make you more likely to have a fall. If you wear glasses, make sure to get your prescription updated if your vision changes.  Medicine review. Work with your health care provider to regularly review all of the medicines you are taking, including over-the-counter medicines. Ask your health care provider about any side effects that may make you more likely to have a fall. Tell your health care provider if any medicines that you take make you feel dizzy or sleepy.  Osteoporosis screening. Osteoporosis is a condition that causes the bones to get weaker. This can make the bones weak and cause them to break more easily.  Blood pressure screening. Blood  pressure changes and medicines to control blood pressure can make you feel dizzy.  Strength and balance checks. Your health care provider may recommend certain tests to check your strength and balance while standing, walking, or changing positions.  Foot health exam. Foot pain and numbness, as well as not wearing proper footwear, can make you more likely to have a fall.  Depression screening. You may be more likely to have a fall if you have a fear of falling, feel emotionally low, or feel unable to do activities that you used to do.  Alcohol use screening. Using too much alcohol can affect your balance and may make you more likely to have a fall. What actions can I take to lower my risk of falls? General instructions  Talk with your health care provider about your risks for falling. Tell your health care provider if: ? You fall. Be sure to tell your health care provider about all falls, even ones that seem minor. ? You feel dizzy, sleepy, or off-balance.  Take over-the-counter and prescription medicines only as told by your health care provider. These include any supplements.  Eat a healthy diet and maintain a healthy weight. A healthy diet includes low-fat dairy products, low-fat (lean) meats, and fiber from whole grains, beans, and lots of fruits and vegetables. Home safety  Remove any tripping hazards, such as rugs, cords, and clutter.  Install safety equipment such as grab bars in bathrooms and safety rails on stairs.  Keep rooms and walkways well-lit.  Activity   Follow a regular exercise program to stay fit. This will help you maintain your balance. Ask your health care provider what types of exercise are appropriate for you.  If you need a cane or walker, use it as recommended by your health care provider.  Wear supportive shoes that have nonskid soles. Lifestyle  Do not drink alcohol if your health care provider tells you not to drink.  If you drink alcohol, limit how  much you have: ? 0-1 drink a day for women. ? 0-2 drinks a day for men.  Be aware of how much alcohol is in your drink. In the [place] one drink equals one typical bottle of beer (12 oz), one-half glass of wine (5 oz), or one shot of hard liquor (1 oz).  Do not use any products that contain nicotine or tobacco, such as cigarettes and e-cigarettes. If you need help quitting, ask your health care provider. Summary  Having a healthy lifestyle and getting preventive care can help to protect your health and wellness after age 28.  Screening and testing are the best way to find a health problem early and help you avoid having a fall. Early diagnosis and treatment give you the best chance for managing medical conditions that are more common for people who are older than age 64.  Falls are a major cause of broken bones and head injuries in people who are older than age 18. Take precautions to prevent a fall at home.  Work with your health care provider to learn what changes you can make to improve your health and wellness and to prevent falls. This information is not intended to replace advice given to you by your health care provider. Make sure you discuss any questions you have with your health care provider. Document Released: 03/23/2017 Document Revised: 08/31/2018 Document Reviewed: 03/23/2017 Elsevier Patient Education  2020 [employer].

## 2019-04-25 NOTE — Assessment & Plan Note (Signed)
Continue tums and vitamin D.

## 2019-04-25 NOTE — Progress Notes (Addendum)
This visit was conducted in person.  BP 126/70 (BP Location: Left Arm, Patient Position: Sitting, Cuff Size: Normal)   Pulse 72   Temp 97.7 F (36.5 C) (Temporal)   Ht 5\' 1"  (1.549 m)   Wt 132 lb (59.9 kg)   SpO2 96%   BMI 24.94 kg/m    CC: AMWV Subjective:    Patient ID: Teresa Meyer, female    DOB: 05/24/1923, 85 y.o.   MRN: [id number]  HPI: Teresa Meyer is a 85 y.o. female presenting on 04/25/2019 for Medicare Wellness (Pt accompanied by aide, Campbell Lerner, 98.3].)   Did not see health advisor this year   Hearing Screening   125Hz  250Hz  500Hz  1000Hz  2000Hz  3000Hz  4000Hz  6000Hz  8000Hz   Right ear:           Left ear:           Comments: Wears bilateral hearing aids.  Wearing at today's OV.   Vision Screening Comments: Last eye exam, 01/2019.     Office Visit from 04/25/2019 in [place] at [facility]  PHQ-2 Total Score  0      Fall Risk  04/25/2019 02/09/2018 02/08/2017 01/12/2016 01/10/2015  Falls in the past year? 0 Yes No No Yes  Number falls in past yr: - 1 - - 1  Injury with Fall? - Yes - - -  Chronic R eye vision loss (congenital), sees Dr Sandra Cockayne regularly, on steroid eye drops to left eye.   Bit R posterior arm "a while ago" wants this checked.   Preventative: Colon cancer screening - age out  Breast cancer screening - age out  Well woman exam - age out, last done 33. Suggested she start checking breasts at home and notify us if any change noted, as she declines mammogram.  DEXA scan - declines Flu shot - yearly Tetanus shot - declines Pneumovax 2013 Zostavax - declines Shingrix - declines Advanced directive discussion - has set up. DNR in chart 2016. Daughter is [employer] [name] [place]). Asked to bring [place] copy of advanced directives.  Seat belt use discussed.  Sunscreen use discussed. No changing moles on skin.  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 Interim medical history since our last visit reviewed. Allergies and medications reviewed and updated. Outpatient Medications Prior to Visit  Medication Sig Dispense Refill  . calcium carbonate (TUMS - DOSED IN MG ELEMENTAL CALCIUM) 500 MG chewable tablet Chew 1 tablet by mouth 2 (two) times daily with a meal.    . cholecalciferol (VITAMIN D) 1000 UNITS tablet Take 1,000 Units by mouth daily.    . diphenhydramine-acetaminophen (TYLENOL PM) 25-500 MG TABS Take 2 tablets by mouth at bedtime as needed.    . prednisoLONE acetate (PRED MILD) 0.12 % ophthalmic suspension Place 1 drop into the left eye daily.     . ferrous sulfate 325 (65 FE) MG tablet Take 1 tablet (325 mg total) by mouth 2 (two) times a week.     No facility-administered medications prior to visit.     Per HPI unless specifically indicated  in ROS section below Review of Systems Objective:    BP 126/70 (BP Location: Left Arm, Patient Position: Sitting, Cuff Size: Normal)   Pulse 72   Temp 97.7 F (36.5 C) (Temporal)   Ht 5\' 1"  (1.549 m)   Wt 132 lb (59.9 kg)   SpO2 96%   BMI 24.94 kg/m   Wt Readings from Last 3 Encounters:  04/25/19 132 lb (59.9 kg)  08/17/18 126 lb 5 oz (57.3 kg)  07/21/18 128 lb 2 oz (58.1 kg)    Physical Exam Vitals signs and nursing note reviewed.  Constitutional:      General: She is not in acute distress.    Appearance: Normal appearance. She is well-developed. She is not ill-appearing.  HENT:     Head: Normocephalic and atraumatic.     Right Ear: Decreased hearing noted.     Left Ear: Decreased hearing noted.     Ears:     Comments: Wears hearing aides    Mouth/Throat:     Pharynx: Uvula midline.   Eyes:     General: No scleral icterus.    Conjunctiva/sclera: Conjunctivae normal.     Comments: Anisocoria. Chronic congenital R vision loss.   Neck:     Musculoskeletal: Normal range of motion and neck supple.     Vascular: No carotid bruit.  Cardiovascular:     Rate and Rhythm: Normal rate and regular rhythm.     Pulses: Normal pulses.          Radial pulses are 2+ on the right side and 2+ on the left side.     Heart sounds: Murmur (2/6 systolic throughout) present.  Pulmonary:     Effort: Pulmonary effort is normal. No respiratory distress.     Breath sounds: Normal breath sounds. No wheezing, rhonchi or rales.  Abdominal:     General: Abdomen is flat. Bowel sounds are normal. There is no distension.     Palpations: Abdomen is soft. There is no mass.     Tenderness: There is no abdominal tenderness. There is no guarding or rebound.     Hernia: No hernia is present.  Musculoskeletal: Normal range of motion.  Lymphadenopathy:     Cervical: No cervical adenopathy.  Skin:    General: Skin is warm and dry.     Findings: Lesion (poorly healing wound R posterior upper arm with central scab) present. No rash.  Neurological:     General: No focal deficit present.     Mental Status: She is alert and oriented to person, place, and time.     Comments:  CN grossly intact, station and gait intact Recall 1/3, 3/3 wit hcue Calculation 5/5 dlrow  Psychiatric:        Mood and Affect: Mood normal.        Behavior: Behavior normal.        Thought Content: Thought content normal.        Judgment: Judgment normal.       Results for orders placed or performed in visit on 04/25/19  Comprehensive metabolic panel  Result Value Ref Range   Sodium 138 135 - 145 mEq/L   Potassium 4.2 3.5 - 5.1 mEq/L   Chloride 103 96 - 112 mEq/L   CO2 29 19 - 32 mEq/L   Glucose, Bld 99 70 - 99 mg/dL   BUN 14 6 - 23 mg/dL   Creatinine, Ser 0.82 0.40 - 1.20 mg/dL   Total Bilirubin 0.6 0.2 - 1.2 mg/dL  Alkaline Phosphatase 53 39 - 117 U/L   AST 18 0 - 37 U/L   ALT 11 0 - 35 U/L   Total Protein 7.0 6.0 - 8.3 g/dL   Albumin 4.4 3.5 - 5.2 g/dL   GFR 64.79 >60.00 mL/min   Calcium 10.1 8.4 - 10.5 mg/dL  CBC with Differential  Result Value Ref Range   WBC 5.0 4.0 - 10.5 K/uL   RBC 3.74 (L) 3.87 - 5.11 Mil/uL   Hemoglobin 12.9 12.0 - 15.0 g/dL   HCT 38.3 36.0 - 46.0 %   MCV 102.3 (H) 78.0 - 100.0 fl   MCHC 33.7 30.0 - 36.0 g/dL   RDW 12.5 11.5 - 15.5 %   Platelets 235.0 150.0 - 400.0 K/uL   Neutrophils Relative % 68.1 43.0 - 77.0 %   Lymphocytes Relative 16.8 12.0 - 46.0 %   Monocytes Relative 10.0 3.0 - 12.0 %   Eosinophils Relative 4.5 0.0 - 5.0 %   Basophils Relative 0.6 0.0 - 3.0 %   Neutro Abs 3.4 1.4 - 7.7 K/uL   Lymphs Abs 0.8 0.7 - 4.0 K/uL   Monocytes Absolute 0.5 0.1 - 1.0 K/uL   Eosinophils Absolute 0.2 0.0 - 0.7 K/uL   Basophils Absolute 0.0 0.0 - 0.1 K/uL  IBC panel  Result Value Ref Range   Iron 89 42 - 145 ug/dL   Transferrin 202.0 (L) 212.0 - 360.0 mg/dL   Saturation Ratios 31.5 20.0 - 50.0 %  Ferritin  Result Value Ref Range   Ferritin 83.7 10.0 - 291.0 ng/mL   Lab Results  Component Value Date   CHOL 271 (H) 09/17/2013   HDL 49.10 09/17/2013   LDLCALC 200 (H) 09/17/2013   TRIG 110.0 09/17/2013   CHOLHDL 6 09/17/2013    Assessment & Plan:  This visit occurred during the SARS-CoV-2 public health emergency.  Safety protocols were in place, including screening questions prior to the visit, additional usage of staff PPE, and extensive cleaning of exam room while observing appropriate contact time as indicated for disinfecting solutions.   Problem List Items Addressed This Visit    Systolic murmur    Again noted today.       Skin lesion    She thinks something bit her 1 wk ago to posterior R arm. She has poorly healing wound to posterior R upper arm. rec vaseline daily and if not improving in 1-2 wks to f/u with derm.       Osteopenia    Continue tums  and vitamin D.       Medicare annual wellness visit, subsequent - Primary    I have personally reviewed the Medicare Annual Wellness questionnaire and have noted 1. The patient's medical and social history 2. Their use of alcohol, tobacco or illicit drugs 3. Their current medications and supplements 4. The patient's functional ability including ADL's, fall risks, home safety risks and hearing or visual impairment. Cognitive function has been assessed and addressed as indicated.  5. Diet and physical activity 6. Evidence for depression or mood disorders The patients weight, height, BMI have been recorded in the chart. I have made referrals, counseling and provided education to the patient based on review of the above and I have provided the pt with a written personalized care plan for preventive services. Provider list updated.. See scanned questionairre as needed for further documentation. Reviewed preventative protocols and updated unless pt declined.       Macrocytic anemia   Iron deficiency anemia  Update iron levels and CBC on twice weekly iron tablet.       Relevant Orders   CBC with Differential (Completed)   IBC panel (Completed)   Ferritin (Completed)   HYPERCHOLESTEROLEMIA    LDL 200 (2015). Pt not interested in medication, previously did not tolerate RYR or statins. Will stop checking.       Relevant Orders   Comprehensive metabolic panel (Completed)   History of malignant melanoma    Continue yearly skin check with derm Nehemiah Massed)      GERD    Chronic, managed with PRN tums.       Essential hypertension    Chronic, stable off antihypertensive. ACEI was stopped 06/2018.       Chronic venous insufficiency    Manages with compression stockings.          No orders of the defined types were placed in this encounter.  Orders Placed This Encounter  Procedures  . Comprehensive metabolic panel  . CBC with Differential  . IBC panel  . Ferritin     Patient instructions: Blood work today If R arm wound not healing well over next 1-2 weeks follow up with Dr Nehemiah Massed.  Great to see you today! Return as needed or in 6 months for follow up visit.   Follow up plan: Return in about 6 months (around 10/24/2019) for follow up visit.  [name], MD

## 2019-04-25 NOTE — Assessment & Plan Note (Signed)

## 2019-04-25 NOTE — Assessment & Plan Note (Addendum)
Chronic, stable off antihypertensive. ACEI was stopped 06/2018.

## 2019-04-25 NOTE — Assessment & Plan Note (Signed)
Continue yearly skin check with derm Nehemiah Massed)

## 2019-04-25 NOTE — Assessment & Plan Note (Addendum)
LDL 200 (2015). Pt not interested in medication, previously did not tolerate RYR or statins. Will stop checking.

## 2019-04-25 NOTE — Assessment & Plan Note (Signed)
She thinks something bit her 1 wk ago to posterior R arm. She has poorly healing wound to posterior R upper arm. rec vaseline daily and if not improving in 1-2 wks to f/u with derm.

## 2019-04-25 NOTE — Assessment & Plan Note (Signed)
Manages with compression stockings.

## 2019-04-25 NOTE — Assessment & Plan Note (Signed)
Again noted today.

## 2019-04-28 ENCOUNTER — Other Ambulatory Visit: Payer: Self-pay | Admitting: Family Medicine

## 2019-06-07 IMAGING — CR DG RIBS W/ CHEST 3+V*L*
4 series · 4 of 4 positions shown · non-contrast
Comparison: 03/17/2007

CLINICAL DATA: Fall

EXAM:
LEFT RIBS AND CHEST - 3+ VIEW

[chest pa]
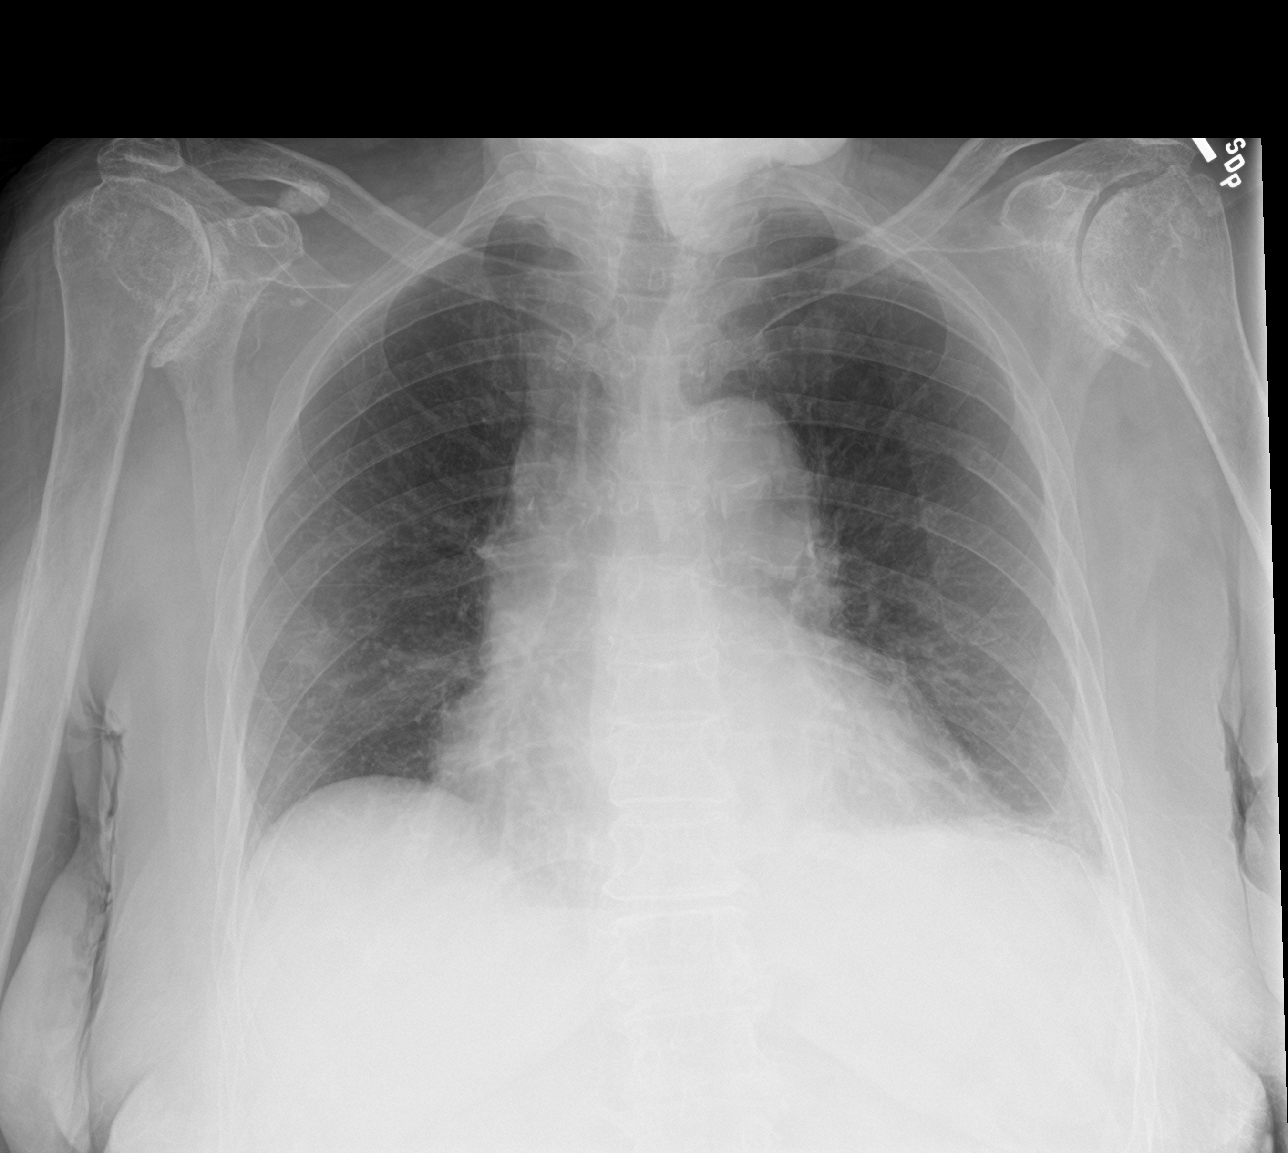

[rib ap]
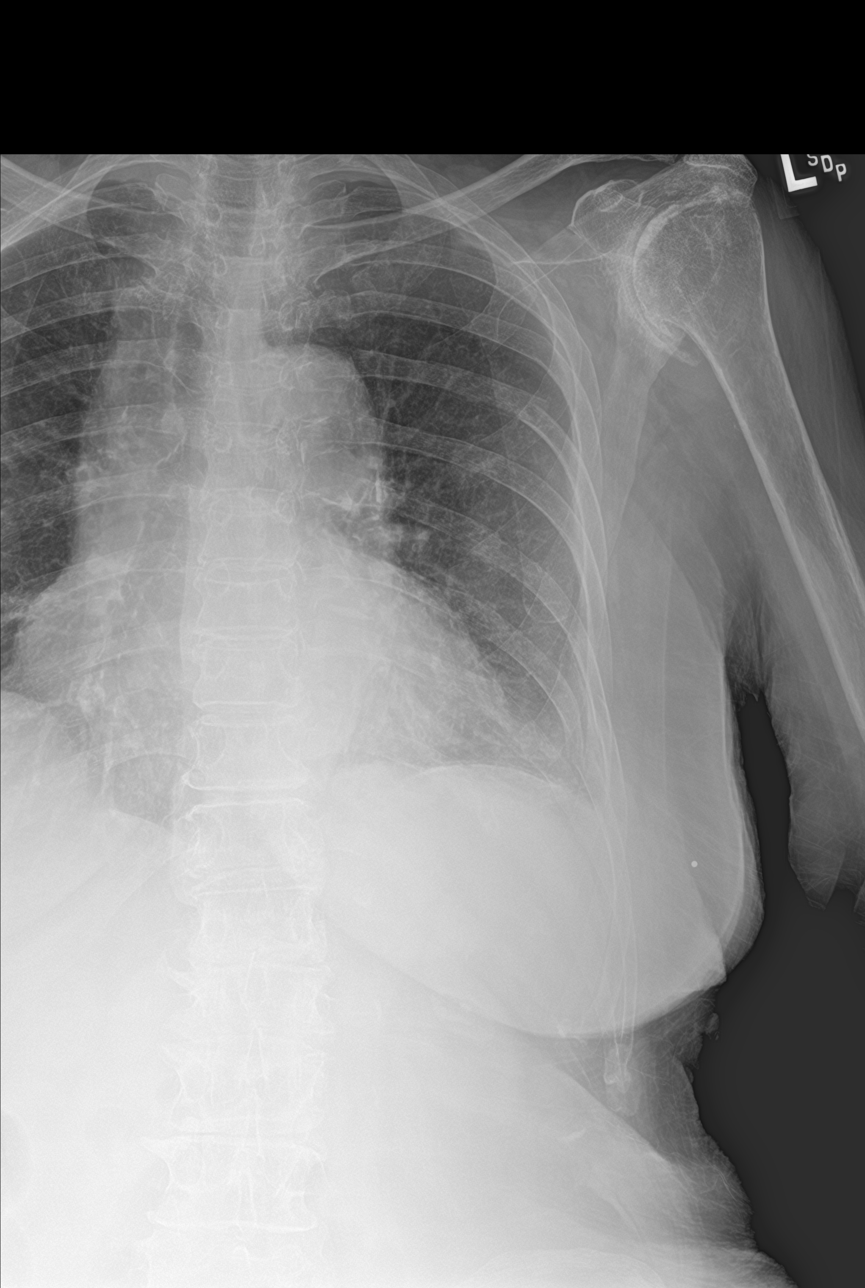

[rib ap obl (1 of 2)]
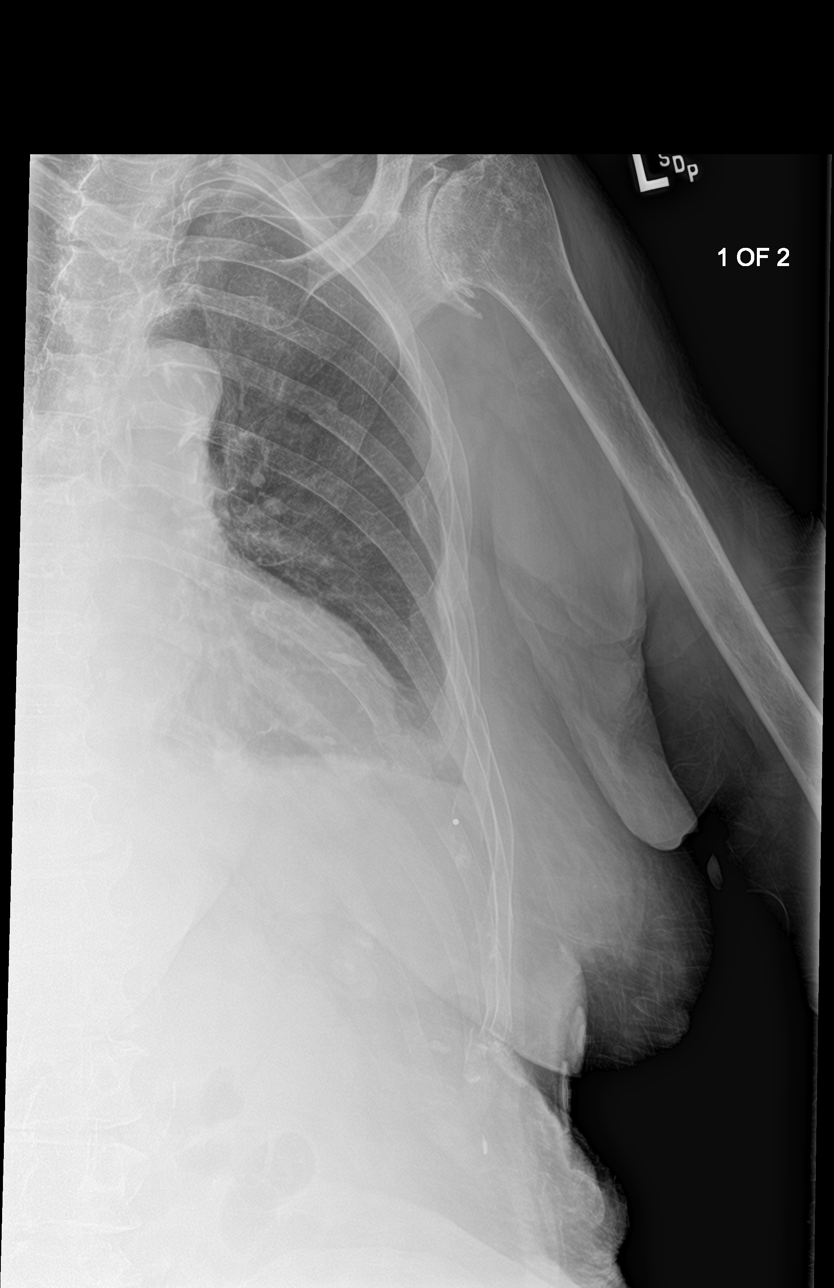

[rib ap obl (2 of 2)]
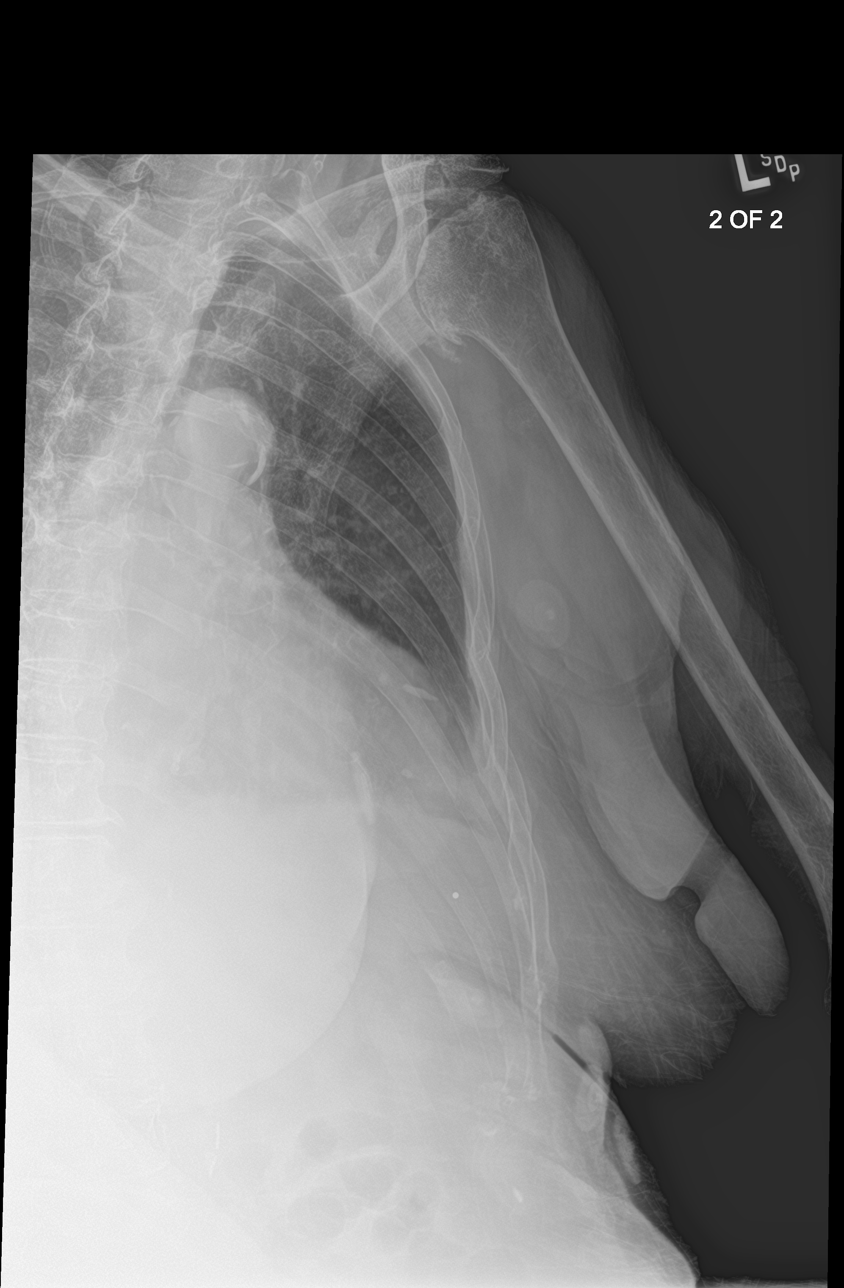

[4 of 4 positions shown; findings below may reference images not displayed]

FINDINGS: Mild cardiomegaly. Aortic tortuosity. Mildly low lung volumes with
streaky atelectasis.

Cortical irregularity of the lateral left fourth and fifth ribs
without convincing complete fracture line.

There is a posterior left 6th and lateral left seventh, ninth, and
tenth rib fracture.
IMPRESSION: 1. Left sixth to tenth rib fractures with mild displacement. Mild
cortical irregularity of left fourth and fifth ribs,
age-indeterminate.
2. Low volume chest with atelectasis. No hemothorax or pneumothorax.

## 2019-08-28 ENCOUNTER — Encounter: Payer: Self-pay | Admitting: Dermatology

## 2019-08-28 ENCOUNTER — Ambulatory Visit (INDEPENDENT_AMBULATORY_CARE_PROVIDER_SITE_OTHER): Payer: Medicare Other | Admitting: Dermatology

## 2019-08-28 ENCOUNTER — Other Ambulatory Visit: Payer: Self-pay

## 2019-08-28 DIAGNOSIS — L821 Other seborrheic keratosis: Secondary | ICD-10-CM

## 2019-08-28 DIAGNOSIS — L570 Actinic keratosis: Secondary | ICD-10-CM

## 2019-08-28 DIAGNOSIS — Z85820 Personal history of malignant melanoma of skin: Secondary | ICD-10-CM | POA: Diagnosis not present

## 2019-08-28 DIAGNOSIS — Z1283 Encounter for screening for malignant neoplasm of skin: Secondary | ICD-10-CM

## 2019-08-28 DIAGNOSIS — C44629 Squamous cell carcinoma of skin of left upper limb, including shoulder: Secondary | ICD-10-CM | POA: Diagnosis not present

## 2019-08-28 DIAGNOSIS — Z85828 Personal history of other malignant neoplasm of skin: Secondary | ICD-10-CM

## 2019-08-28 DIAGNOSIS — L820 Inflamed seborrheic keratosis: Secondary | ICD-10-CM | POA: Diagnosis not present

## 2019-08-28 DIAGNOSIS — D492 Neoplasm of unspecified behavior of bone, soft tissue, and skin: Secondary | ICD-10-CM

## 2019-08-28 DIAGNOSIS — D692 Other nonthrombocytopenic purpura: Secondary | ICD-10-CM

## 2019-08-28 DIAGNOSIS — L578 Other skin changes due to chronic exposure to nonionizing radiation: Secondary | ICD-10-CM

## 2019-08-28 NOTE — Progress Notes (Signed)
Follow-Up Visit   Subjective  Teresa Meyer is a 86 y.o. female who presents for the following: TBSE (hx Melanoma R lat deltoid).  She was seen at [facility] who did an excision.  They did not feel any more aggressive treatment was necessary at this time.  She will continue follow-up here as they did not make her a follow-up at [facility]. She also complains of several spots that are rough and she would like them treated as they are irritating. She also has a spot on the hand that is persistent and sore she would like evaluated. The following portions of the chart were reviewed this encounter and updated as appropriate:     Review of Systems: No other skin or systemic complaints.  Objective  Well appearing patient in no apparent distress; mood and affect are within normal limits.  A full examination was performed including scalp, head, eyes, ears, nose, lips, neck, chest, axillae, abdomen, back, buttocks, bilateral upper extremities, bilateral lower extremities, hands, feet, fingers, toes, fingernails, and toenails. All findings within normal limits unless otherwise noted below.  Objective  R cheek x 1, R dorsum hand x 2 (3): Pink scaly macules   Objective  L med leg below knee, R pretibial lateral: Well healed scar with no evidence of recurrence, no lymphadenopathy.   Objective  R distal dorsum nose: Well healed scar with no evidence of recurrence.   Objective  R lat deltoid: Well healed scar with no evidence of recurrence, no lymphadenopathy.   Objective  Right Shoulder x 1, R arm x 1, L leg x 11, L arm x 6 (19): Stuck on waxy paps with erythema   Objective  Left medial hand: Hyperkeratotic pap 1.1cm  Assessment & Plan    Seborrheic Keratoses - Stuck-on, waxy, tan-brown papules and plaques  - Discussed benign etiology and prognosis. - Observe - Call for any changes  Actinic Damage - diffuse scaly erythematous macules with underlying dyspigmentation -  Recommend daily broad spectrum sunscreen SPF 30+ to sun-exposed areas, reapply every 2 hours as needed.  - Call for new or changing lesions.  Purpura - Violaceous macules and patches - Benign - Related to age, sun damage and/or use of blood thinners - Observe - Can use OTC arnica containing moisturizer such as Dermend Bruise Formula if desired - Call for worsening or other concerns Skin cancer screening performed today.   AK (actinic keratosis) (3) R cheek x 1, R dorsum hand x 2  Destruction of lesion - R cheek x 1, R dorsum hand x 2 Complexity: simple   Destruction method: cryotherapy   Informed consent: discussed and consent obtained   Timeout:  patient name, date of birth, surgical site, and procedure verified Lesion destroyed using liquid nitrogen: Yes   Region frozen until ice ball extended beyond lesion: Yes   Outcome: patient tolerated procedure well with no complications   Post-procedure details: wound care instructions given    History of SCC (squamous cell carcinoma) of skin L med leg below knee, R pretibial lateral  Clear. Observe for recurrence. Call clinic for new or changing lesions.  Recommend regular skin exams, daily broad-spectrum spf 30+ sunscreen use, and photoprotection.     History of basal cell carcinoma (BCC) R distal dorsum nose  Clear. Observe for recurrence. Call clinic for new or changing lesions.  Recommend regular skin exams, daily broad-spectrum spf 30+ sunscreen use, and photoprotection.     History of melanoma R lat deltoid Status post  wide local excision at [facility].  She does not have a follow-up there as they feel like they have done all that is necessary at this time.  Will follow here at [facility] skin center. Clear. Observe for recurrence. Call clinic for new or changing lesions.  Recommend regular skin exams, daily broad-spectrum spf 30+ sunscreen use, and photoprotection.     Inflamed seborrheic keratosis (19) Right Shoulder  x 1, R arm x 1, L leg x 11, L arm x 6  Destruction of lesion - Right Shoulder x 1, R arm x 1, L leg x 11, L arm x 6 Complexity: simple   Destruction method: cryotherapy   Informed consent: discussed and consent obtained   Timeout:  patient name, date of birth, surgical site, and procedure verified Lesion destroyed using liquid nitrogen: Yes   Region frozen until ice ball extended beyond lesion: Yes   Outcome: patient tolerated procedure well with no complications   Post-procedure details: wound care instructions given    Neoplasm of skin Left medial hand  Epidermal / dermal shaving  Lesion length (cm):  1.1 Lesion width (cm):  1.1 Margin per side (cm):  0.2 Total excision diameter (cm):  1.5 Informed consent: discussed and consent obtained   Timeout: patient name, date of birth, surgical site, and procedure verified   Procedure prep:  Patient was prepped and draped in usual sterile fashion Prep type:  Isopropyl alcohol Anesthesia: the lesion was anesthetized in a standard fashion   Anesthetic:  1% lidocaine w/ epinephrine 1-100,000 buffered w/ 8.4% NaHCO3 Instrument used: flexible razor blade   Hemostasis achieved with: pressure, aluminum chloride and electrodesiccation   Outcome: patient tolerated procedure well    Destruction of lesion Complexity: extensive   Destruction method: electrodesiccation and curettage   Informed consent: discussed and consent obtained   Timeout:  patient name, date of birth, surgical site, and procedure verified Procedure prep:  Patient was prepped and draped in usual sterile fashion Prep type:  Isopropyl alcohol Anesthesia: the lesion was anesthetized in a standard fashion   Anesthetic:  1% lidocaine w/ epinephrine 1-100,000 buffered w/ 8.4% NaHCO3 Curettage performed in three different directions: Yes   Electrodesiccation performed over the curetted area: Yes   Lesion length (cm):  1.1 Lesion width (cm):  1.1 Margin per side (cm):  0.2 Final  wound size (cm):  1.5 Hemostasis achieved with:  pressure, aluminum chloride and electrodesiccation Outcome: patient tolerated procedure well with no complications   Post-procedure details: sterile dressing applied and wound care instructions given   Dressing type: bandage and petrolatum    Specimen 1 - Surgical pathology Differential Diagnosis: R/O SCC Check Margins: No Hyperkeratotic pap 1.1cm EDC today Hx of Melanoma  Return in about 3 months (around 11/27/2019) for TBSE hx Melanoma, BCC, SCC, AK.   I, Sonya Hupman, RMA, am acting as scribe for Sarina Ser, MD .

## 2019-08-28 NOTE — Patient Instructions (Signed)

## 2019-08-30 ENCOUNTER — Encounter: Payer: Self-pay | Admitting: Dermatology

## 2019-09-03 ENCOUNTER — Telehealth: Payer: Self-pay

## 2019-09-03 NOTE — Telephone Encounter (Signed)
Discussed biopsy results with pt  °

## 2019-09-10 ENCOUNTER — Ambulatory Visit (INDEPENDENT_AMBULATORY_CARE_PROVIDER_SITE_OTHER): Payer: Medicare Other | Admitting: Dermatology

## 2019-09-10 ENCOUNTER — Other Ambulatory Visit: Payer: Self-pay

## 2019-09-10 DIAGNOSIS — L820 Inflamed seborrheic keratosis: Secondary | ICD-10-CM

## 2019-09-10 DIAGNOSIS — Z85820 Personal history of malignant melanoma of skin: Secondary | ICD-10-CM

## 2019-09-10 DIAGNOSIS — L578 Other skin changes due to chronic exposure to nonionizing radiation: Secondary | ICD-10-CM

## 2019-09-10 DIAGNOSIS — Z85828 Personal history of other malignant neoplasm of skin: Secondary | ICD-10-CM | POA: Diagnosis not present

## 2019-09-10 NOTE — Patient Instructions (Signed)
Cryotherapy Aftercare  . Wash gently with soap and water everyday.   . Apply Vaseline and Band-Aid daily until healed.  

## 2019-09-10 NOTE — Progress Notes (Signed)
   Follow-Up Visit   Subjective  Teresa Meyer is a 86 y.o. female who presents for the following: Skin Problem.  Patient has spots on legs and arms that itch. They have been present for months. Some have been treated previously with LN2. She had a MM at her right lateral deltoid in February 2021 and was treated at [facility] by Dr. Manley Mason.   The following portions of the chart were reviewed this encounter and updated as appropriate:  Tobacco  Allergies  Meds  Problems  Med Hx  Surg Hx  Fam Hx      Review of Systems:  No other skin or systemic complaints except as noted in HPI or Assessment and Plan.  Objective  Well appearing patient in no apparent distress; mood and affect are within normal limits.  A focused examination was performed including face, arms, legs. Relevant physical exam findings are noted in the Assessment and Plan.  Objective  Left Medial Hand: Well healed scar with no evidence of recurrence, no lymphadenopathy.   Objective  Right Lateral Deltoid: Well healed scar with no evidence of recurrence, no lymphadenopathy.    Assessment & Plan  History of SCC (squamous cell carcinoma) of skin Left Medial Hand  No evidence of recurrence, call clinic for new or changing lesions.   History of melanoma Right Lateral Deltoid  No evidence of recurrence, call clinic for new or changing lesions.   Inflamed seborrheic keratosis (21) Back x 20, chest x 1  Destruction of lesion - Back x 20, chest x 1 Complexity: simple   Destruction method: cryotherapy   Informed consent: discussed and consent obtained   Timeout:  patient name, date of birth, surgical site, and procedure verified Lesion destroyed using liquid nitrogen: Yes   Region frozen until ice ball extended beyond lesion: Yes   Outcome: patient tolerated procedure well with no complications   Post-procedure details: wound care instructions given    Actinic Damage - diffuse scaly erythematous macules with  underlying dyspigmentation - Recommend daily broad spectrum sunscreen SPF 30+ to sun-exposed areas, reapply every 2 hours as needed.  - Call for new or changing lesions.  Return as scheduled, for TBSE.  Graciella Belton, RMA, am acting as scribe for Sarina Ser, MD .  Documentation: I have reviewed the above documentation for accuracy and completeness, and I agree with the above.  Sarina Ser, MD

## 2019-09-11 ENCOUNTER — Encounter: Payer: Self-pay | Admitting: Dermatology

## 2019-12-05 ENCOUNTER — Ambulatory Visit (INDEPENDENT_AMBULATORY_CARE_PROVIDER_SITE_OTHER): Payer: Medicare Other | Admitting: Dermatology

## 2019-12-05 ENCOUNTER — Other Ambulatory Visit: Payer: Self-pay

## 2019-12-05 DIAGNOSIS — Z85820 Personal history of malignant melanoma of skin: Secondary | ICD-10-CM

## 2019-12-05 DIAGNOSIS — D229 Melanocytic nevi, unspecified: Secondary | ICD-10-CM | POA: Diagnosis not present

## 2019-12-05 DIAGNOSIS — L821 Other seborrheic keratosis: Secondary | ICD-10-CM

## 2019-12-05 DIAGNOSIS — D485 Neoplasm of uncertain behavior of skin: Secondary | ICD-10-CM | POA: Diagnosis not present

## 2019-12-05 DIAGNOSIS — L570 Actinic keratosis: Secondary | ICD-10-CM

## 2019-12-05 DIAGNOSIS — Z1283 Encounter for screening for malignant neoplasm of skin: Secondary | ICD-10-CM

## 2019-12-05 DIAGNOSIS — L814 Other melanin hyperpigmentation: Secondary | ICD-10-CM

## 2019-12-05 DIAGNOSIS — L820 Inflamed seborrheic keratosis: Secondary | ICD-10-CM

## 2019-12-05 DIAGNOSIS — D1800 Hemangioma unspecified site: Secondary | ICD-10-CM | POA: Diagnosis not present

## 2019-12-05 DIAGNOSIS — L578 Other skin changes due to chronic exposure to nonionizing radiation: Secondary | ICD-10-CM

## 2019-12-05 DIAGNOSIS — Z85828 Personal history of other malignant neoplasm of skin: Secondary | ICD-10-CM

## 2019-12-05 NOTE — Progress Notes (Signed)
Follow-Up Visit   Subjective  Teresa Meyer is a 86 y.o. female who presents for the following: Annual Exam (History of Melanoma, BCC and SCC - TBSE today.). The patient presents for Total-Body Skin Exam (TBSE) for skin cancer screening and mole check.  The following portions of the chart were reviewed this encounter and updated as appropriate:  Tobacco  Allergies  Meds  Problems  Med Hx  Surg Hx  Fam Hx     Review of Systems:  No other skin or systemic complaints except as noted in HPI or Assessment and Plan.  Objective  Well appearing patient in no apparent distress; mood and affect are within normal limits.  A full examination was performed including scalp, head, eyes, ears, nose, lips, neck, chest, axillae, abdomen, back, buttocks, bilateral upper extremities, bilateral lower extremities, hands, feet, fingers, toes, fingernails, and toenails. All findings within normal limits unless otherwise noted below.  Objective  Right lat deltoid: Well healed excision site. No lymphadenopathy.  Objective  Left dorsum medial hand: 1.1 cm hyperkeratotic papule  Objective  Face (7): Erythematous thin papules/macules with gritty scale.   Objective  Left Forearm - Posterior: Erythematous keratotic or waxy stuck-on papule or plaque.    Assessment & Plan    Lentigines - Scattered tan macules - Discussed due to sun exposure - Benign, observe - Call for any changes  Seborrheic Keratoses - Stuck-on, waxy, tan-brown papules and plaques  - Discussed benign etiology and prognosis. - Observe - Call for any changes  Melanocytic Nevi - Tan-brown and/or pink-flesh-colored symmetric macules and papules - Benign appearing on exam today - Observation - Call clinic for new or changing moles - Recommend daily use of broad spectrum spf 30+ sunscreen to sun-exposed areas.   Hemangiomas - Red papules - Discussed benign nature - Observe - Call for any changes  Actinic Damage -  diffuse scaly erythematous macules with underlying dyspigmentation - Recommend daily broad spectrum sunscreen SPF 30+ to sun-exposed areas, reapply every 2 hours as needed.  - Call for new or changing lesions.  Skin cancer screening performed today.  History of melanoma Right lat deltoid Clear. Observe for recurrence. Call clinic for new or changing lesions.  Recommend regular skin exams, daily broad-spectrum spf 30+ sunscreen use, and photoprotection.   No LAD Clear today. Observe  Neoplasm of uncertain behavior of skin Left dorsum medial hand Possible recurrent SCC  Skin excision  Lesion length (cm):  1.1 Lesion width (cm):  1.1 Margin per side (cm):  0.2 Total excision diameter (cm):  1.5 Informed consent: discussed and consent obtained   Timeout: patient name, date of birth, surgical site, and procedure verified   Procedure prep:  Patient was prepped and draped in usual sterile fashion Prep type:  Isopropyl alcohol Anesthesia: the lesion was anesthetized in a standard fashion   Anesthetic:  1% lidocaine w/ epinephrine 1-100,000 buffered w/ 8.4% NaHCO3 Instrument used: #15 blade   Hemostasis achieved with: pressure   Hemostasis achieved with comment:  Electrocautery Outcome: patient tolerated procedure well with no complications   Post-procedure details: sterile dressing applied and wound care instructions given   Dressing type: bandage and pressure dressing (mupirocin)    Specimen 1 - Surgical pathology Differential Diagnosis: Recurrent SCC vs other  Check Margins: No 1.1 cm hyperkeratotic papule [id number] Simple excision today  AK (actinic keratosis) (7) Face  Destruction of lesion - Face Complexity: simple   Destruction method: cryotherapy   Informed consent: discussed and consent obtained  Timeout:  patient name, date of birth, surgical site, and procedure verified Lesion destroyed using liquid nitrogen: Yes   Region frozen until ice ball extended beyond  lesion: Yes   Outcome: patient tolerated procedure well with no complications   Post-procedure details: wound care instructions given    Inflamed seborrheic keratosis Left Forearm - Posterior  Destruction of lesion - Left Forearm - Posterior Complexity: simple   Destruction method: cryotherapy   Informed consent: discussed and consent obtained   Timeout:  patient name, date of birth, surgical site, and procedure verified Lesion destroyed using liquid nitrogen: Yes   Region frozen until ice ball extended beyond lesion: Yes   Outcome: patient tolerated procedure well with no complications   Post-procedure details: wound care instructions given    Return in about 3 months (around 03/06/2020) for TBSE.   I, Ashok Cordia, CMA, am acting as scribe for Sarina Ser, MD .  Documentation: I have reviewed the above documentation for accuracy and completeness, and I agree with the above.  Sarina Ser, MD

## 2019-12-05 NOTE — Patient Instructions (Signed)

## 2019-12-09 ENCOUNTER — Encounter: Payer: Self-pay | Admitting: Dermatology

## 2019-12-10 ENCOUNTER — Telehealth: Payer: Self-pay

## 2019-12-10 NOTE — Telephone Encounter (Signed)
Advised patient of pathology results.

## 2019-12-10 NOTE — Telephone Encounter (Signed)
-----   Message from Ralene Bathe, MD sent at 12/06/2019  6:35 PM EDT ----- Skin , left dorsum medial hand HYPERTROPHIC ACTINIC KERATOSIS  PreCancer  Already treated Recheck next visit

## 2020-01-02 ENCOUNTER — Ambulatory Visit: Payer: Self-pay | Admitting: Dermatology

## 2020-01-30 ENCOUNTER — Telehealth: Payer: Self-pay

## 2020-01-30 NOTE — Telephone Encounter (Signed)
I spoke with Teresa Meyer at [facility] and she did receive the [facility] and pt will be assigned a room, labs drawn and IV started within the hour. Will send FL2 for scanning and copy given to [facility] CMA if needed prior to scanning. Teresa Meyer updated by phone and she was very appreciative to [name]. FYI to [name].

## 2020-01-30 NOTE — Telephone Encounter (Signed)
Malachy Mood at [facility] has the verbal orders but cannot proceed with anything until gets the faxed FL2 form

## 2020-01-30 NOTE — Telephone Encounter (Signed)
[name] is speaking with Malachy Mood nurse at [facility] now.

## 2020-01-30 NOTE — Telephone Encounter (Signed)
[facility] - Client TELEPHONE ADVICE RECORD [facility] Patient Name: Teresa Meyer Gender: Female DOB: 1922-10-21 Age: 86 Y 40 M 27 D Return Phone Number: [phone] (Primary), [phone] (Secondary) Address: City/State/Zip: [place] [address] Client [facility] - Client Client Site Lane Physician [name] - MD Contact Type Call Who Is Calling Patient / Member / Family / Caregiver Call Type Triage / Clinical Caller Name [name] Relationship To Patient Daughter Return Phone Number [phone] (Secondary) Chief Complaint Heart palpitations or irregular heartbeat Reason for Call Symptomatic / Request for [employer] states her mother is pale with abdominal pain and high pulse rate. Translation No Nurse Assessment Nurse: Raenette Rover, RN, Zella Ball Date/Time (Eastern Time): 01/29/2020 3:03:36 PM Confirm and document reason for call. If symptomatic, describe symptoms. ---BS 154, temp 98. Was reported Sunday she had been throwing up with diarrhea. Her stomach hurts on the right side. Daughter is in [place]. Mother is in [place] in [facility]. [facility] Has the patient had close contact with a person known or suspected to have the novel coronavirus illness OR traveled / lives in area with major community spread (including international travel) in the last 14 days from the onset of symptoms? * If Asymptomatic, screen for exposure and travel within the last 14 days. ---No Does the patient have any new or worsening symptoms? ---Yes Will a triage be completed? ---Yes Related visit to physician within the last 2 weeks? ---No Does the PT have any chronic conditions? (i.e. diabetes, asthma, this includes High risk factors for pregnancy, etc.) ---Yes List chronic conditions. ---Htn Is this a behavioral health or substance abuse call?  ---No Guidelines Guideline Title Affirmed Question Affirmed Notes Nurse Date/Time (Eastern Time) Abdominal Pain - Female [1] MILD-MODERATE pain AND [2] constant AND [3] present > 2 hours Raenette Rover, RN, Zella Ball 01/29/2020 3:09:00 PM PLEASE NOTE: All timestamps contained within this report are represented as [place] Standard Time. CONFIDENTIALTY NOTICE: This fax transmission is intended only for the addressee. It contains information that is legally privileged, confidential or otherwise protected from use or disclosure. If you are not the intended recipient, you are strictly prohibited from reviewing, disclosing, copying using or disseminating any of this information or taking any action in reliance on or regarding this information. If you have received this fax in error, please notify us immediately by telephone so that we can arrange for its return to [place]. Phone: [phone], Toll-Free: [phone], Fax: [phone] Page: 2 of 2 Call Id: [id number] [place]. Time Eilene Ghazi Time) Disposition Final User 01/29/2020 3:12:26 PM See HCP within 4 Hours (or PCP triage) Yes Raenette Rover, RN, Herbert Deaner Disagree/Comply Comply Caller Understands Yes PreDisposition Did not know what to do Care Advice Given Per Guideline SEE HCP (OR PCP TRIAGE) WITHIN 4 HOURS: * Lie down and rest. * Do this until seen. REST: CALL BACK IF: * You become worse CARE ADVICE given per Abdominal Pain, Female (Adult) guideline. Comments User: Wilson Singer, RN Date/Time Eilene Ghazi Time): 01/29/2020 3:17:31 PM Called pcp on the back line and no appointments are available. Advised to go to an urgent care or closest Emergency room. Referrals GO TO FACILITY UNDECIDED

## 2020-01-30 NOTE — Telephone Encounter (Signed)
I spoke with pts daughter, [name]; pt did not go to [facility]  Or ED. Pt went to full time care at [facility]. [name] said she needs an appt; no available appts but pt should go to ED for eval and possible testing and IV fluids if pt is dehydrated. [name] is in [facility] and she will call [facility] to see what can do to get pt to ED.

## 2020-01-30 NOTE — Telephone Encounter (Signed)
Malachy Mood called back to speak with Rena. Please advise. Call back is [phone].

## 2020-01-30 NOTE — Telephone Encounter (Signed)
Patient's nephew came into office and became upset wanting to speak with CMA. CMA was in patient's room and unable to come to front to speak with nephew. Nephew and his wife came into office and are wanting to update PCP about everything going on and stating they are needing to discuss starting an IV on patient due to her being very weak. Expressed they tried to take her to a walk in clinic and had to lower her to the ground because she was so weak. Took all information and [name] called in at that time stating she spoke with triage and was advised that they will get in contact with [facility] to take vitals and then Dr.G will place order to have EMS come and pick up patient to take to ER. Collected best phone number for nephew as he would like a call when everything has been done. Please advise.

## 2020-01-30 NOTE — Telephone Encounter (Signed)
Malachy Mood nurse with 3rd [facility] calling our office;Malachy Mood received call from pts daughter and wants pt seen today. Malachy Mood said pt could be dehydrated and is uncomfortable. Malachy Mood wanted to know if Webb Silversmith NP could see pt this morning at the [facility] or does [name] want pt sent to ED and if so will need verbal order to send pt to ED. I spoke with [name] and he said ideally to have Rollene Fare NP ck pt today but if pt is in a lot of pain send to ED. I spoke with Threasa Beards CMA and she spoke with Rollene Fare NP and Rollene Fare NP is on vacation and will not be seeing pts at [facility] today; Threasa Beards CMA advised to ck with Dr Silvio Pate; Larene Beach CMA said that Dr Silvio Pate is seeing pts at [facility] this AM and then has house calls this afternoon and will not be at [facility] until 01/31/20.Malachy Mood said pt is very uncomfortable and verbal order given to send pt to ED. Nothing further needed but sending to [name] as Juluis Rainier.

## 2020-01-30 NOTE — Telephone Encounter (Signed)
There is dehydration concern and patient does not currently have transportation to office. Agree with ER eval.  Please call tomorrow for update on symptoms and evaluation.

## 2020-01-30 NOTE — Telephone Encounter (Signed)
Spoke with nephew Mr Teresa Meyer and his wife in office today, spoke with daughter Teresa Meyer by phone.  Doubtful to find available hospital bed for direct admission in setting of COVID pandemic. They agree to SNF admission for labs and IVF to start.  FL2 form filled and faxed.

## 2020-01-30 NOTE — Telephone Encounter (Signed)
Placed FL2 form in Dr. Synthia Innocent box.

## 2020-01-30 NOTE — Telephone Encounter (Signed)
Teresa Meyer called back; she wants pt to have a hospital bed between [place] and pt is not drinking or eating and Teresa Meyer is very concerned that pt is dehydrated and weak. Teresa Meyer said she wants pt admitted to the hospital. EMS advised pts daughter that [facility] does not have any beds available for admission and pt would have to wait 12 - 24 hrs to be seen at ED. EMS left. I spoke with Malachy Mood nurse at [facility].pt did not eat or drink anything at breakfast this morning; pt said she is no longer nauseated and not having rt side pain now. Pt has not taken anything for nausea. Pt had watery diarrhea last night and again this morning.  Vitals with EMS earlier were T 99.8- P 83 - R 16 pulse ox was 87%  Now pulse ox is 90% and FBS 136 and BP 138/64. Pt is pale but lips are not blue. Malachy Mood said if pt was admitted to [facility] skilled nursing pt could have stat labs done and IV fluids. FL2 would need to be signed and faxed to [facility]. (fax # attached to [facility] form given to [name].) pts nephew Mr Higinio Plan is presently at [facility] and is speaking with [name] now. (DPR signed). Pt was also rapid covid tested on 01/29/20 and results were negative per Malachy Mood nurse at [facility]. I spoke with Teresa Meyer to give her an update on what is going on and Malachy Mood said it is OK to admit pt to skilled nursing if that would get pt an IV.

## 2020-01-31 DIAGNOSIS — R112 Nausea with vomiting, unspecified: Secondary | ICD-10-CM

## 2020-01-31 DIAGNOSIS — F05 Delirium due to known physiological condition: Secondary | ICD-10-CM

## 2020-01-31 DIAGNOSIS — K219 Gastro-esophageal reflux disease without esophagitis: Secondary | ICD-10-CM

## 2020-02-01 NOTE — Telephone Encounter (Addendum)
Late entry - spoke with Timmothy Sours yesterday morning and with Dr Silvio Pate this morning for update on patient - overall improving. 500cc NS bolus helped. Will likely return to independent living over next 1-2 days

## 2020-02-05 ENCOUNTER — Telehealth: Payer: Self-pay | Admitting: Family Medicine

## 2020-02-05 NOTE — Telephone Encounter (Signed)
Received forms from [facility] through the mail. They need to be signed and sent back. I placed these on the cart.

## 2020-02-06 NOTE — Telephone Encounter (Signed)
Signed and in Lisa's box. Plz mail back.

## 2020-02-06 NOTE — Telephone Encounter (Signed)
Mailed forms.  Made copies to scan.

## 2020-02-12 ENCOUNTER — Telehealth: Payer: Self-pay | Admitting: *Deleted

## 2020-02-12 ENCOUNTER — Encounter: Payer: Self-pay | Admitting: *Deleted

## 2020-02-12 ENCOUNTER — Emergency Department: Payer: Medicare Other

## 2020-02-12 ENCOUNTER — Inpatient Hospital Stay
Admission: EM | Admit: 2020-02-12 | Discharge: 2020-02-15 | DRG: 872 | Disposition: A | Discharge status: Skilled Nursing Facility | Payer: Medicare Other | Attending: Internal Medicine | Admitting: Internal Medicine

## 2020-02-12 ENCOUNTER — Inpatient Hospital Stay: Payer: Medicare Other

## 2020-02-12 ENCOUNTER — Other Ambulatory Visit: Payer: Self-pay

## 2020-02-12 DIAGNOSIS — A419 Sepsis, unspecified organism: Secondary | ICD-10-CM

## 2020-02-12 DIAGNOSIS — H9190 Unspecified hearing loss, unspecified ear: Secondary | ICD-10-CM | POA: Diagnosis present

## 2020-02-12 DIAGNOSIS — M810 Age-related osteoporosis without current pathological fracture: Secondary | ICD-10-CM | POA: Diagnosis present

## 2020-02-12 DIAGNOSIS — K219 Gastro-esophageal reflux disease without esophagitis: Secondary | ICD-10-CM | POA: Diagnosis present

## 2020-02-12 DIAGNOSIS — C439 Malignant melanoma of skin, unspecified: Secondary | ICD-10-CM | POA: Diagnosis present

## 2020-02-12 DIAGNOSIS — E872 Acidosis, unspecified: Secondary | ICD-10-CM

## 2020-02-12 DIAGNOSIS — D72819 Decreased white blood cell count, unspecified: Secondary | ICD-10-CM | POA: Diagnosis present

## 2020-02-12 DIAGNOSIS — Z20822 Contact with and (suspected) exposure to covid-19: Secondary | ICD-10-CM | POA: Diagnosis present

## 2020-02-12 DIAGNOSIS — Z800 Family history of malignant neoplasm of digestive organs: Secondary | ICD-10-CM | POA: Diagnosis not present

## 2020-02-12 DIAGNOSIS — A408 Other streptococcal sepsis: Principal | ICD-10-CM | POA: Diagnosis present

## 2020-02-12 DIAGNOSIS — Z66 Do not resuscitate: Secondary | ICD-10-CM | POA: Diagnosis present

## 2020-02-12 DIAGNOSIS — Z7189 Other specified counseling: Secondary | ICD-10-CM | POA: Diagnosis not present

## 2020-02-12 DIAGNOSIS — I10 Essential (primary) hypertension: Secondary | ICD-10-CM | POA: Diagnosis present

## 2020-02-12 DIAGNOSIS — I739 Peripheral vascular disease, unspecified: Secondary | ICD-10-CM | POA: Diagnosis present

## 2020-02-12 DIAGNOSIS — E7800 Pure hypercholesterolemia, unspecified: Secondary | ICD-10-CM | POA: Diagnosis present

## 2020-02-12 DIAGNOSIS — Z801 Family history of malignant neoplasm of trachea, bronchus and lung: Secondary | ICD-10-CM

## 2020-02-12 DIAGNOSIS — K8043 Calculus of bile duct with acute cholecystitis with obstruction: Secondary | ICD-10-CM | POA: Diagnosis present

## 2020-02-12 DIAGNOSIS — K810 Acute cholecystitis: Secondary | ICD-10-CM

## 2020-02-12 DIAGNOSIS — IMO0001 Reserved for inherently not codable concepts without codable children: Secondary | ICD-10-CM

## 2020-02-12 DIAGNOSIS — E876 Hypokalemia: Secondary | ICD-10-CM

## 2020-02-12 DIAGNOSIS — Z515 Encounter for palliative care: Secondary | ICD-10-CM

## 2020-02-12 DIAGNOSIS — D649 Anemia, unspecified: Secondary | ICD-10-CM | POA: Diagnosis present

## 2020-02-12 DIAGNOSIS — K82A1 Gangrene of gallbladder in cholecystitis: Secondary | ICD-10-CM | POA: Diagnosis present

## 2020-02-12 DIAGNOSIS — E871 Hypo-osmolality and hyponatremia: Secondary | ICD-10-CM | POA: Diagnosis present

## 2020-02-12 DIAGNOSIS — I700 Atherosclerosis of aorta: Secondary | ICD-10-CM | POA: Diagnosis present

## 2020-02-12 DIAGNOSIS — Z803 Family history of malignant neoplasm of breast: Secondary | ICD-10-CM | POA: Diagnosis not present

## 2020-02-12 DIAGNOSIS — K8020 Calculus of gallbladder without cholecystitis without obstruction: Secondary | ICD-10-CM

## 2020-02-12 HISTORY — DX: Sepsis, unspecified organism: A41.9

## 2020-02-12 HISTORY — DX: Acute cholecystitis: K81.0

## 2020-02-12 LAB — CBC WITH DIFFERENTIAL/PLATELET
Abs Immature Granulocytes: 0.23 10*3/uL — ABNORMAL HIGH (ref 0.00–0.07)
Basophils Absolute: 0 10*3/uL (ref 0.0–0.1)
Basophils Relative: 0 %
Eosinophils Absolute: 0 10*3/uL (ref 0.0–0.5)
Eosinophils Relative: 0 %
HCT: 36 % (ref 36.0–46.0)
Hemoglobin: 12.1 g/dL (ref 12.0–15.0)
Immature Granulocytes: 1 %
Lymphocytes Relative: 1 %
Lymphs Abs: 0.3 10*3/uL — ABNORMAL LOW (ref 0.7–4.0)
MCH: 33.5 pg (ref 26.0–34.0)
MCHC: 33.6 g/dL (ref 30.0–36.0)
MCV: 99.7 fL (ref 80.0–100.0)
Monocytes Absolute: 0.7 10*3/uL (ref 0.1–1.0)
Monocytes Relative: 4 %
Neutro Abs: 18.9 10*3/uL — ABNORMAL HIGH (ref 1.7–7.7)
Neutrophils Relative %: 94 %
Platelets: 284 10*3/uL (ref 150–400)
RBC: 3.61 MIL/uL — ABNORMAL LOW (ref 3.87–5.11)
RDW: 12.4 % (ref 11.5–15.5)
WBC: 20.2 10*3/uL — ABNORMAL HIGH (ref 4.0–10.5)
nRBC: 0 % (ref 0.0–0.2)

## 2020-02-12 LAB — GASTROINTESTINAL PANEL BY PCR, STOOL (REPLACES STOOL CULTURE)

## 2020-02-12 LAB — SARS CORONAVIRUS 2 BY RT PCR (HOSPITAL ORDER, PERFORMED IN ~~LOC~~ HOSPITAL LAB): SARS Coronavirus 2: NEGATIVE

## 2020-02-12 LAB — COMPREHENSIVE METABOLIC PANEL
ALT: 11 U/L (ref 0–44)
AST: 18 U/L (ref 15–41)
Albumin: 3.5 g/dL (ref 3.5–5.0)
Alkaline Phosphatase: 56 U/L (ref 38–126)
Anion gap: 13 (ref 5–15)
BUN: 12 mg/dL (ref 8–23)
CO2: 23 mmol/L (ref 22–32)
Calcium: 8.7 mg/dL — ABNORMAL LOW (ref 8.9–10.3)
Chloride: 94 mmol/L — ABNORMAL LOW (ref 98–111)
Creatinine, Ser: 0.89 mg/dL (ref 0.44–1.00)
GFR calc Af Amer: >60 mL/min (ref >60)
GFR calc non Af Amer: 55 mL/min — ABNORMAL LOW (ref >60)
Glucose, Bld: 145 mg/dL — ABNORMAL HIGH (ref 70–99)
Potassium: 3.3 mmol/L — ABNORMAL LOW (ref 3.5–5.1)
Sodium: 130 mmol/L — ABNORMAL LOW (ref 135–145)
Total Bilirubin: 1.8 mg/dL — ABNORMAL HIGH (ref 0.3–1.2)
Total Protein: 7.1 g/dL (ref 6.5–8.1)

## 2020-02-12 LAB — URINALYSIS, COMPLETE (UACMP) WITH MICROSCOPIC
Bacteria, UA: NONE SEEN
Bilirubin Urine: NEGATIVE
Glucose, UA: NEGATIVE mg/dL
Ketones, ur: 20 mg/dL — AB
Leukocytes,Ua: NEGATIVE
Nitrite: NEGATIVE
Protein, ur: 100 mg/dL — AB
Specific Gravity, Urine: 1.024 (ref 1.005–1.030)
pH: 5 (ref 5.0–8.0)

## 2020-02-12 LAB — C DIFFICILE QUICK SCREEN W PCR REFLEX
C Diff antigen: NEGATIVE
C Diff interpretation: NOT DETECTED
C Diff toxin: NEGATIVE

## 2020-02-12 LAB — MRSA PCR SCREENING: MRSA by PCR: NEGATIVE

## 2020-02-12 LAB — PROCALCITONIN: Procalcitonin: 1.18 ng/mL

## 2020-02-12 LAB — LACTIC ACID, PLASMA
Lactic Acid, Venous: 2.2 mmol/L (ref 0.5–1.9)
Lactic Acid, Venous: 2.7 mmol/L (ref 0.5–1.9)
Lactic Acid, Venous: 2.8 mmol/L (ref 0.5–1.9)
Lactic Acid, Venous: 2.6 mmol/L (ref 0.5–1.9)

## 2020-02-12 LAB — PROTIME-INR
INR: 1.3 — ABNORMAL HIGH (ref 0.8–1.2)
Prothrombin Time: 16 seconds — ABNORMAL HIGH (ref 11.4–15.2)

## 2020-02-12 LAB — URINE CULTURE

## 2020-02-12 LAB — APTT: aPTT: 41 seconds — ABNORMAL HIGH (ref 24–36)

## 2020-02-12 LAB — MAGNESIUM: Magnesium: 1.2 mg/dL — ABNORMAL LOW (ref 1.7–2.4)

## 2020-02-12 LAB — LIPASE, BLOOD: Lipase: 29 U/L (ref 11–51)

## 2020-02-12 LAB — CORTISOL-AM, BLOOD: Cortisol - AM: 24.2 ug/dL — ABNORMAL HIGH (ref 6.7–22.6)

## 2020-02-12 MED ORDER — LACTATED RINGERS IV BOLUS
1000.0000 mL | Freq: Once | INTRAVENOUS | Status: AC
  Administered 2020-02-12: 1000 mL via INTRAVENOUS

## 2020-02-12 MED ORDER — SODIUM CHLORIDE 0.9% FLUSH
5.0000 mL | Freq: Three times a day (TID) | INTRAVENOUS | Status: DC
  Administered 2020-02-12 – 2020-02-15 (×7): 5 mL

## 2020-02-12 MED ORDER — MORPHINE SULFATE (PF) 2 MG/ML IV SOLN
2.0000 mg | INTRAVENOUS | Status: DC | PRN
  Administered 2020-02-12: 2 mg via INTRAVENOUS
  Filled 2020-02-12: qty 1

## 2020-02-12 MED ORDER — SODIUM CHLORIDE 0.9 % IV SOLN: 2.0000 g | Freq: Two times a day (BID) | INTRAVENOUS | Status: DC

## 2020-02-12 MED ORDER — LACTATED RINGERS IV BOLUS (SEPSIS)
500.0000 mL | Freq: Once | INTRAVENOUS | Status: AC
  Administered 2020-02-12: 500 mL via INTRAVENOUS

## 2020-02-12 MED ORDER — SODIUM CHLORIDE 0.9 % IV SOLN
2.0000 g | Freq: Once | INTRAVENOUS | Status: AC
  Administered 2020-02-12: 2 g via INTRAVENOUS
  Filled 2020-02-12: qty 2

## 2020-02-12 MED ORDER — ONDANSETRON HCL 4 MG/2ML IJ SOLN: 4.0000 mg | Freq: Four times a day (QID) | INTRAMUSCULAR | Status: DC | PRN

## 2020-02-12 MED ORDER — MIDAZOLAM HCL 2 MG/2ML IJ SOLN
INTRAMUSCULAR | Status: DC | PRN
  Administered 2020-02-12: 0.5 mg via INTRAVENOUS

## 2020-02-12 MED ORDER — FENTANYL CITRATE (PF) 100 MCG/2ML IJ SOLN
INTRAMUSCULAR | Status: AC
  Filled 2020-02-12: qty 2

## 2020-02-12 MED ORDER — FENTANYL CITRATE (PF) 100 MCG/2ML IJ SOLN
INTRAMUSCULAR | Status: DC | PRN
  Administered 2020-02-12: 25 ug via INTRAVENOUS

## 2020-02-12 MED ORDER — IOHEXOL 300 MG/ML  SOLN
100.0000 mL | Freq: Once | INTRAMUSCULAR | Status: AC | PRN
  Administered 2020-02-12: 100 mL via INTRAVENOUS

## 2020-02-12 MED ORDER — MIDAZOLAM HCL 2 MG/2ML IJ SOLN
INTRAMUSCULAR | Status: AC
  Filled 2020-02-12: qty 2

## 2020-02-12 MED ORDER — MAGNESIUM SULFATE 2 GM/50ML IV SOLN
2.0000 g | Freq: Once | INTRAVENOUS | Status: AC
  Administered 2020-02-12: 2 g via INTRAVENOUS
  Filled 2020-02-12: qty 50

## 2020-02-12 MED ORDER — ONDANSETRON HCL 4 MG PO TABS: 4.0000 mg | ORAL_TABLET | Freq: Four times a day (QID) | ORAL | Status: DC | PRN

## 2020-02-12 MED ORDER — METRONIDAZOLE IN NACL 5-0.79 MG/ML-% IV SOLN
500.0000 mg | Freq: Three times a day (TID) | INTRAVENOUS | Status: DC
  Administered 2020-02-12 – 2020-02-15 (×8): 500 mg via INTRAVENOUS
  Filled 2020-02-12 (×10): qty 100

## 2020-02-12 MED ORDER — SODIUM CHLORIDE 0.9 % IV SOLN: 2.0000 g | Freq: Once | INTRAVENOUS | Status: DC

## 2020-02-12 MED ORDER — SODIUM CHLORIDE 0.9 % IV SOLN
2.0000 g | Freq: Two times a day (BID) | INTRAVENOUS | Status: DC
  Administered 2020-02-13 – 2020-02-14 (×3): 2 g via INTRAVENOUS
  Filled 2020-02-12 (×5): qty 2

## 2020-02-12 MED ORDER — METRONIDAZOLE IN NACL 5-0.79 MG/ML-% IV SOLN
500.0000 mg | Freq: Once | INTRAVENOUS | Status: AC
  Administered 2020-02-12: 500 mg via INTRAVENOUS
  Filled 2020-02-12: qty 100

## 2020-02-12 MED ORDER — POTASSIUM CHLORIDE CRYS ER 20 MEQ PO TBCR
40.0000 meq | EXTENDED_RELEASE_TABLET | Freq: Once | ORAL | Status: AC
  Administered 2020-02-12: 40 meq via ORAL
  Filled 2020-02-12: qty 2

## 2020-02-12 MED ORDER — LACTATED RINGERS IV SOLN
INTRAVENOUS | Status: AC
  Administered 2020-02-12: 1000 mL via INTRAVENOUS

## 2020-02-12 NOTE — ED Triage Notes (Signed)
Pt brought in via ems from [facility].  Pt here for abd pain with n/v/dpt alert  Iv infusing.

## 2020-02-12 NOTE — ED Notes (Signed)
Report off to anna rn  

## 2020-02-12 NOTE — Progress Notes (Signed)
Patient clinically stable post Cholecystostomy tube placement 10FR drain per Dr Kathlene Cote, tolerated well. Vitals stable pre and post procedure. Denies complaints post procedure. Given Versed 0.5 mg along with Fentanyl 25 mcg IV for procedure. 75 ml of dk whitish/tan purulent drainage from tube placment. Post procedure/recovery taken back to ED 34 for observation and awaiting bed placement. Report given to [place] in er with questions answered.

## 2020-02-12 NOTE — Telephone Encounter (Signed)
Called daughter twice unable to reach - will try tomorrow.

## 2020-02-12 NOTE — Progress Notes (Signed)
Pharmacy Antibiotic Note  Teresa Meyer is a 86 y.o. female admitted on 02/12/2020 with intra-abdominal.  Pharmacy has been consulted for Cefepime dosing. CrCl = 32 ml/min  Plan: Cefepime 2 gm IV X 1 given in ED on 9/21 @ 0359. Cefepime 2 gm IV Q12H ordered to start on 9/21 @ 1600.   Height: 5' (152.4 cm) Weight: 65.8 kg (145 lb) IBW/kg (Calculated) : 45.5  Temp (24hrs), Avg:100 F (37.8 C), Min:100 F (37.8 C), Max:100 F (37.8 C)  Recent Labs  Lab 02/12/20 0118  WBC 20.2*  CREATININE 0.89  LATICACIDVEN 2.2*    Estimated Creatinine Clearance: 32 mL/min (by C-G formula based on SCr of 0.89 mg/dL).    Allergies  Allergen Reactions  . Bisphosphonates Other (See Comments)    Dysphagia to oral bisphosphonates  . Codeine   . Statins Other (See Comments)    Muscle aches  . Sulfa Antibiotics     Can't recall reaction  . Procaine Hcl Palpitations    Antimicrobials this admission:   >>    >>   Dose adjustments this admission:   Microbiology results:  BCx:   UCx:    Sputum:    MRSA PCR:   Thank you for allowing pharmacy to be a part of this patient's care.  Arriel Victor D 02/12/2020 5:07 AM

## 2020-02-12 NOTE — ED Notes (Signed)
Pt given water to drink. 

## 2020-02-12 NOTE — ED Notes (Signed)
Pt urinated. Pt cleaned and changed and blood drawn. Pt updated on status. Pt angry she is NPO.

## 2020-02-12 NOTE — Progress Notes (Signed)
CODE SEPSIS - PHARMACY COMMUNICATION  **Broad Spectrum Antibiotics should be administered within 1 hour of Sepsis diagnosis**  Time Code Sepsis Called/Page Received: 9/21 @ 0329   Antibiotics Ordered:  Cefepime   Time of 1st antibiotic administration: 9/21 @ 0359   Additional action taken by pharmacy: none   If necessary, Name of Provider/Nurse Contacted:     Chaun Uemura D ,PharmD Clinical Pharmacist  02/12/2020  4:18 AM

## 2020-02-12 NOTE — ED Notes (Signed)
Daughter at the bedside

## 2020-02-12 NOTE — H&P (Signed)
History and Physical    Teresa Meyer DOB: 02-21-1923 DOA: 02/12/2020  PCP: [name], MD   Patient coming from: [facility]  I have personally briefly reviewed patient's old medical records in [place]  Chief Complaint: Nausea, vomiting x 2 weeks abdominal pain  HPI: Teresa Meyer is a 86 y.o. female with medical history significant for HTN, hearing loss, recently diagnosed melanoma, who presented from the nursing home with concerns for nausea vomiting, intermittent for the past 2 weeks.  Patient resides in assisted living at [facility] and over the course of the past 2 weeks received IV hydration on the nursing home side of the facility where she improved and was transferred back to ALF 3 days prior but symptoms returned and became acutely worse in the past 24 hours, with associated diarrhea, poor appetite, shaking chills and severe crampy upper abdominal pain and protracted weakness.  She denies cough or shortness of breath or dysuria.  History provided in part by daughter Jeani Hawking over the phone. ED Course: On arrival she had a low-grade temperature of 100, soft blood pressure 104/55, tachycardic at 93, respirations 22.  Blood work significant for leukocytosis of 20,000 with lactic acid 2.2.  She also had some electrolyte abnormalities with sodium 130 and potassium 3.3.  Total bilirubin elevated at 1.8.  Covid swab negative chest x-ray with no acute findings CT abdomen and pelvis significant for gangrenous cholecystitis with areas of mural perforation and intramural fluid collection with obstructing calculus noted at the cystic duct.  The emergency room provider consulted surgeon, Dr. Celine Ahr who recommended medicine admission with IR consultation for percutaneous cholecystostomy tube.  ER provider subsequently spoke with IR, Dr. Kathlene Cote who will see patient in the a.m.  Hospitalist consulted for admission. EKG: My independent interpretation : Sinus rhythm with no  acute ST-T wave changes  Review of Systems: As per HPI otherwise all other systems on review of systems negative.    Past Medical History:  Diagnosis Date  . Arthritis   . Atypical nevi 09/2014   lentiginous dysplastic nevus with mod atypia ([place])  . Basal cell carcinoma 08/12/2017   R distal dorsum nose   . Diverticulitis   . DNR (do not resuscitate)   . Esophageal stricture 2012  . GERD (gastroesophageal reflux disease)   . HLD (hyperlipidemia)    did not tolerate statin, stopped checking  . HTN (hypertension)   . Leg wound, left, subsequent encounter 11/12/2017  . Malignant melanoma ([place]) 2015, 2017   R chin s/p excision ([place]) R upper back [name])  . Malignant melanoma ([place]) 07/27/19 [facility]   R lat deltoid  . Osteoporosis, post-menopausal    bisphosphonate caused dysphagia, requests defer DEXA for now  . Seasonal allergies   . Squamous cell carcinoma of skin 08/21/2018   L med leg below knee  . Squamous cell carcinoma of skin 01/20/2017   R pretibial lateral  . Wears hearing aid     Past Surgical History:  Procedure Laterality Date  . BUNIONECTOMY Right 2001  . CARPAL TUNNEL RELEASE Bilateral 1974  . CATARACT EXTRACTION Bilateral    [place]  . TONSILLECTOMY  1928     reports that she has never smoked. She has never used smokeless tobacco. She reports current alcohol use. She reports that she does not use drugs.  Allergies  Allergen Reactions  . Bisphosphonates Other (See Comments)    Dysphagia to oral bisphosphonates  . Codeine   . Statins  Other (See Comments)    Muscle aches  . Sulfa Antibiotics     Can't recall reaction  . Procaine Hcl Palpitations    Family History  Problem Relation Age of Onset  . Pancreatic cancer Son   . Breast cancer Sister   . Lung cancer Mother   . Diabetes Other        nephew  . Stroke Neg Hx   . CAD Neg Hx       Prior to Admission medications   Medication Sig Start Date End Date Taking? Authorizing Provider    calcium carbonate (OS-CAL) 1250 (500 Ca) MG chewable tablet Chew by mouth.    [provider]  calcium carbonate (TUMS - DOSED IN MG ELEMENTAL CALCIUM) 500 MG chewable tablet Chew 1 tablet by mouth 2 (two) times daily with a meal.    [provider]  cholecalciferol (VITAMIN D) 1000 UNITS tablet Take 1,000 Units by mouth daily.    [provider]  diphenhydramine-acetaminophen (TYLENOL PM) 25-500 MG TABS Take 2 tablets by mouth at bedtime as needed.    [provider]  nepafenac (ILEVRO) 0.3 % ophthalmic suspension SHAKE LIQUID AND INSTILL 1 DROP IN LEFT EYE EVERY DAY 06/12/19   [provider]  prednisoLONE acetate (PRED MILD) 0.12 % ophthalmic suspension Place 1 drop into the left eye daily.     [provider]    Physical Exam: Vitals:   02/12/20 0245 02/12/20 0330 02/12/20 0345 02/12/20 0400  BP:  (!) 139/52  127/68  Pulse: 88 89 84 86  Resp: 20 19 (!) 39 (!) 22  Temp:      TempSrc:      SpO2: 100% 95% 93% 96%  Weight:      Height:         Vitals:   02/12/20 0245 02/12/20 0330 02/12/20 0345 02/12/20 0400  BP:  (!) 139/52  127/68  Pulse: 88 89 84 86  Resp: 20 19 (!) 39 (!) 22  Temp:      TempSrc:      SpO2: 100% 95% 93% 96%  Weight:      Height:          Constitutional:  Ill-appearing but oriented x 3 .  Not in acute distress HEENT:      Head: Normocephalic and atraumatic.         Eyes: PERLA, EOMI, Conjunctivae are normal. Sclera is non-icteric.       Mouth/Throat: Mucous membranes are moist.       Neck: Supple with no signs of meningismus. Cardiovascular:  Tachycardic. No murmurs, gallops, or rubs. 2+ symmetrical distal pulses are present . No JVD. No LE edema Respiratory:  Mild tachypnea.Lungs sounds clear bilaterally. No wheezes, crackles, or rhonchi.  Gastrointestinal: Soft, tender on palpation in RUQ, and non distended with positive bowel sounds. No rebound or guarding. Genitourinary: No CVA  tenderness. Musculoskeletal: Nontender with normal range of motion in all extremities. No cyanosis, or erythema of extremities. Neurologic:  Face is symmetric. Moving all extremities. No gross focal neurologic deficits . Skin: Skin is warm, dry.  No rash or ulcers Psychiatric: Mood and affect are normal    Labs on Admission: I have personally reviewed following labs and imaging studies  CBC: Recent Labs  Lab 02/12/20 0118  WBC 20.2*  NEUTROABS 18.9*  HGB 12.1  HCT 36.0  MCV 99.7  PLT 366   Basic Metabolic Panel: Recent Labs  Lab 02/12/20 0118  NA 130*  K  3.3*  CL 94*  CO2 23  GLUCOSE 145*  BUN 12  CREATININE 0.89  CALCIUM 8.7*  MG 1.2*   GFR: Estimated Creatinine Clearance: 32 mL/min (by C-G formula based on SCr of 0.89 mg/dL). Liver Function Tests: Recent Labs  Lab 02/12/20 0118  AST 18  ALT 11  ALKPHOS 56  BILITOT 1.8*  PROT 7.1  ALBUMIN 3.5   Recent Labs  Lab 02/12/20 0118  LIPASE 29   No results for input(s): AMMONIA in the last 168 hours. Coagulation Profile: Recent Labs  Lab 02/12/20 0118  INR 1.3*   Cardiac Enzymes: No results for input(s): CKTOTAL, CKMB, CKMBINDEX, TROPONINI in the last 168 hours. BNP (last 3 results) No results for input(s): PROBNP in the last 8760 hours. HbA1C: No results for input(s): HGBA1C in the last 72 hours. CBG: No results for input(s): GLUCAP in the last 168 hours. Lipid Profile: No results for input(s): CHOL, HDL, LDLCALC, TRIG, CHOLHDL, LDLDIRECT in the last 72 hours. Thyroid Function Tests: No results for input(s): TSH, T4TOTAL, FREET4, T3FREE, THYROIDAB in the last 72 hours. Anemia Panel: No results for input(s): VITAMINB12, FOLATE, FERRITIN, TIBC, IRON, RETICCTPCT in the last 72 hours. Urine analysis:    Component Value Date/Time   COLORURINE AMBER (A) 02/12/2020 0118   APPEARANCEUR CLOUDY (A) 02/12/2020 0118   LABSPEC 1.024 02/12/2020 0118   PHURINE 5.0 02/12/2020 0118   GLUCOSEU NEGATIVE  02/12/2020 0118   HGBUR MODERATE (A) 02/12/2020 0118   BILIRUBINUR NEGATIVE 02/12/2020 0118   KETONESUR 20 (A) 02/12/2020 0118   PROTEINUR 100 (A) 02/12/2020 0118   NITRITE NEGATIVE 02/12/2020 0118   LEUKOCYTESUR NEGATIVE 02/12/2020 0118    Radiological Exams on Admission: DG Chest 1 View  Result Date: 02/12/2020 CLINICAL DATA:  Tachypnea EXAM: CHEST  1 VIEW COMPARISON:  10/11/2017 FINDINGS: Cardiac shadow is stable. Aortic calcifications are again noted. The overall inspiratory effort is poor with mild left basilar atelectasis. No pneumothorax or effusion is seen. Old healed rib fractures are noted on the left with degenerative changes of the shoulder joints and thoracic spine. IMPRESSION: Mild left basilar atelectasis.  No other focal abnormality is seen. Electronically Signed   By: Inez Catalina M.D.   On: 02/12/2020 02:02   CT ABDOMEN PELVIS W CONTRAST  Result Date: 02/12/2020 CLINICAL DATA:  Acute, nonlocalized abdominal pain. Nausea and vomiting. EXAM: CT ABDOMEN AND PELVIS WITH CONTRAST TECHNIQUE: Multidetector CT imaging of the abdomen and pelvis was performed using the standard protocol following bolus administration of intravenous contrast. CONTRAST:  18mL OMNIPAQUE IOHEXOL 300 MG/ML  SOLN COMPARISON:  None. FINDINGS: Lower chest: The visualized lung bases are clear bilaterally. The visualized heart and pericardium are unremarkable. Hepatobiliary: The gallbladder is distended and there is extensive pericholecystic inflammatory stranding identified in keeping with changes of acute cholecystitis. Moreover, there are areas of discontinuous mural enhancement and loculated pericholecystic fluid and intramural fluid in keeping with changes of gangrenous cholecystitis. No obstructing calculus is seen within the gallbladder neck best noted on coronal image # 42/5. Several additional layering gallstones are seen within the gallbladder lumen. The liver is unremarkable. No intra or extrahepatic  biliary ductal dilation. Pancreas: Unremarkable Spleen: Unremarkable Adrenals/Urinary Tract: Adrenal glands are normal. The kidneys are unremarkable. Bladder is unremarkable. Stomach/Bowel: Small hiatal hernia. Stomach, small bowel, and large bowel are unremarkable. Appendix normal. No free intraperitoneal gas or fluid. Vascular/Lymphatic: Moderate aortic atherosclerotic calcification is present without evidence of aneurysm. Focal stenoses of the celiac axis and superior mesenteric artery are  noted at their origins as well as the right renal artery at its origin, though the degree of stenosis is not well assessed on this non arteriographic study. No pathologic adenopathy within the abdomen and pelvis. Reproductive: Uterus and bilateral adnexa are unremarkable. Other: Tiny fat containing umbilical hernia.  Rectum unremarkable. Musculoskeletal: Degenerative changes are seen within the lumbar spine. No lytic or blastic bone lesions. Remote superior endplate fracture of L3 noted. IMPRESSION: Gangrenous cholecystitis with areas of mural non enhancement, perforation, and intramural fluid collection. Obstructing calculus noted at the cystic duct. Surgical consultation is advised. Peripheral vascular disease. If there is clinical evidence of chronic mesenteric ischemia or hemodynamically significant renal artery stenosis, CT arteriography may be more helpful for further evaluation. Aortic Atherosclerosis (ICD10-I70.0). These results will be called to the ordering clinician or representative by the Radiologist Assistant, and communication documented in the PACS or [employer]. Electronically Signed   By: Fidela Salisbury MD   On: 02/12/2020 03:35     Assessment/Plan 86 year old female  with history of HTN, hearing impairment, recently diagnosed melanoma, presenting with two week history of intermittent nausea vomiting and abdominal pain that became acutely worse in the past 24 hours.    Sepsis ([place])   Acute  gangrenous cholecystitis -Patient presents with low-grade temperature, tachycardia tachypnea leukocytosis and elevated lactic acid -CT abdomen pelvis showing gangrenous cholecystitis with mural perforation and obstructing calculus at cystic duct -Surgery consulted from the ER.  Dr. Celine Ahr recommended IR -IR consult for percutaneous cholecystostomy placed -Cefepime and Flagyl -IV fluid resuscitation per sepsis protocol -Spoke with daughters, Jeani Hawking and [name]  about serious nature of illness, guarded prognosis, plan of care.  They are agreeable with plan of care.  POA Jeani Hawking gives permission to speak with 2 relatives who are physicians Willaim Bane a surgeon and Sherlyn Lees, a radiologist    Hypercholesteremia --Holding all home meds    HTN (hypertension) --IV prn meds    Malignant melanoma ([place]) --no acute concerns    Hearing impairment --Increased nursing care     DVT prophylaxis: SCDs Code Status: DNR Family Communication:  Daughers, Jerlyn Ly and Marcello Moores Disposition Plan: Back to previous home environment Consults called: IR  Status:At the time of admission, it appears that the appropriate admission status for this patient is INPATIENT. This is judged to be reasonable and necessary in order to provide the required intensity of service to ensure the patient's safety given the presenting symptoms, physical exam findings, and initial radiographic and laboratory data in the context of their  Comorbid conditions.   Patient requires inpatient status due to high intensity of service, high risk for further deterioration and high frequency of surveillance required.   I certify that at the point of admission it is my clinical judgment that the patient will require inpatient hospital care spanning beyond [place] MD [facility]     02/12/2020, 4:50 AM

## 2020-02-12 NOTE — ED Provider Notes (Signed)
[facility] Emergency Department Provider Note  ____________________________________________   First MD Initiated Contact with Patient 02/12/20 0113     (approximate)  I have reviewed the triage vital signs and the nursing notes.   HISTORY  Chief Complaint Abdominal Pain   HPI Teresa Meyer is a 86 y.o. female with a past medical history of HTN, HDL, GERD, diverticulitis, hearing loss, and osteopenia who presents via EMS from a nursing facility after family member called with concerns for nausea vomiting and diarrhea associate with some abdominal pain.  Patient is extremely hard of hearing does endorse nausea vomiting and diarrhea and some crampy abdominal pain.  She denies any chest pain, cough, shortness of breath, or urinary symptoms.  She denies any falls.         Past Medical History:  Diagnosis Date  . Arthritis   . Atypical nevi 09/2014   lentiginous dysplastic nevus with mod atypia ([place])  . Basal cell carcinoma 08/12/2017   R distal dorsum nose   . Diverticulitis   . DNR (do not resuscitate)   . Esophageal stricture 2012  . GERD (gastroesophageal reflux disease)   . HLD (hyperlipidemia)    did not tolerate statin, stopped checking  . HTN (hypertension)   . Leg wound, left, subsequent encounter 11/12/2017  . Malignant melanoma ([place]) 2015, 2017   R chin s/p excision ([place]) R upper back [name])  . Malignant melanoma ([place]) 07/27/19 WLE at [facility]   R lat deltoid  . Osteoporosis, post-menopausal    bisphosphonate caused dysphagia, requests defer DEXA for now  . Seasonal allergies   . Squamous cell carcinoma of skin 08/21/2018   L med leg below knee  . Squamous cell carcinoma of skin 01/20/2017   R pretibial lateral  . Wears hearing aid     Patient Active Problem List   Diagnosis Date Noted  . Skin lesion 08/17/2018  . Iron deficiency anemia 11/12/2017  . Chronic venous insufficiency 11/12/2017  . Multiple rib fractures 11/02/2017  .  Fall at home, initial encounter 11/02/2017  . Macrocytic anemia 12/26/2015  . Pedal edema 10/28/2015  . Systolic murmur 75/64/3329  . Advanced care planning/counseling discussion 05/07/2014  . History of malignant melanoma   . Medicare annual wellness visit, subsequent 09/24/2013  . DNR (do not resuscitate)   . GERD 07/15/2009  . ESOPHAGEAL STRICTURE 12/19/2008  . ARTHRITIS 11/26/2008  . HYPERCHOLESTEROLEMIA 11/20/2008  . Essential hypertension 11/20/2008  . DIVERTICULOSIS, COLON 11/20/2008  . Osteopenia 11/20/2008  . Dysphagia 11/20/2008    Past Surgical History:  Procedure Laterality Date  . BUNIONECTOMY Right 2001  . CARPAL TUNNEL RELEASE Bilateral 1974  . CATARACT EXTRACTION Bilateral    [place]  . TONSILLECTOMY  1928    Prior to Admission medications   Medication Sig Start Date End Date Taking? Authorizing Provider  calcium carbonate (OS-CAL) 1250 (500 Ca) MG chewable tablet Chew by mouth.    [provider]  calcium carbonate (TUMS - DOSED IN MG ELEMENTAL CALCIUM) 500 MG chewable tablet Chew 1 tablet by mouth 2 (two) times daily with a meal.    [provider]  cholecalciferol (VITAMIN D) 1000 UNITS tablet Take 1,000 Units by mouth daily.    [provider]  diphenhydramine-acetaminophen (TYLENOL PM) 25-500 MG TABS Take 2 tablets by mouth at bedtime as needed.    [provider]  nepafenac (ILEVRO) 0.3 % ophthalmic suspension SHAKE LIQUID AND INSTILL 1 DROP IN LEFT EYE EVERY DAY 06/12/19  [provider]  prednisoLONE acetate (PRED MILD) 0.12 % ophthalmic suspension Place 1 drop into the left eye daily.     [provider]    Allergies Bisphosphonates, Codeine, Statins, Sulfa antibiotics, and Procaine hcl  Family History  Problem Relation Age of Onset  . Pancreatic cancer Son   . Breast cancer Sister   . Lung cancer Mother   . Diabetes Other        nephew  . Stroke Neg Hx   . CAD Neg Hx     Social  History Social History   Tobacco Use  . Smoking status: Never Smoker  . Smokeless tobacco: Never Used  Vaping Use  . Vaping Use: Never used  Substance Use Topics  . Alcohol use: Yes    Alcohol/week: 0.0 standard drinks    Comment: 1/2 glass wine nightly  . Drug use: No    Review of Systems  Review of Systems  Constitutional: Negative for chills and fever.  HENT: Negative for sore throat.   Eyes: Negative for pain.  Respiratory: Negative for cough and stridor.   Cardiovascular: Negative for chest pain.  Gastrointestinal: Positive for abdominal pain, diarrhea, nausea and vomiting.  Genitourinary: Negative for dysuria.  Musculoskeletal: Negative for myalgias.  Skin: Negative for rash.  Neurological: Negative for seizures, loss of consciousness and headaches.  Psychiatric/Behavioral: Negative for suicidal ideas.  All other systems reviewed and are negative.     ____________________________________________   PHYSICAL EXAM:  VITAL SIGNS: ED Triage Vitals [02/12/20 0112]  Enc Vitals Group     BP      Pulse      Resp      Temp      Temp src      SpO2      Weight 145 lb (65.8 kg)     Height 5' (1.524 m)     Head Circumference      Peak Flow      Pain Score      Pain Loc      Pain Edu?      Excl. in [place]?    Vitals:   02/12/20 0230 02/12/20 0245  BP: 130/74   Pulse: 94 88  Resp: (!) 22 20  Temp:    SpO2: 100% 100%   Physical Exam Vitals and nursing note reviewed.  Constitutional:      General: She is not in acute distress.    Appearance: She is well-developed. She is ill-appearing.  HENT:     Head: Normocephalic and atraumatic.     Right Ear: External ear normal.     Left Ear: External ear normal.     Nose: Nose normal.     Mouth/Throat:     Mouth: Mucous membranes are dry.  Eyes:     Conjunctiva/sclera: Conjunctivae normal.  Cardiovascular:     Rate and Rhythm: Normal rate and regular rhythm.     Pulses: Normal pulses.     Heart sounds: No murmur  heard.   Pulmonary:     Effort: Pulmonary effort is normal. No respiratory distress.     Breath sounds: Normal breath sounds.  Abdominal:     Palpations: Abdomen is soft.     Tenderness: There is generalized abdominal tenderness and tenderness in the right upper quadrant. There is no right CVA tenderness or left CVA tenderness.  Musculoskeletal:     Cervical back: Neck supple.     Right lower leg: No edema.     Left lower  leg: No edema.  Skin:    General: Skin is warm and dry.     Capillary Refill: Capillary refill takes 2 to 3 seconds.  Neurological:     Mental Status: She is alert.  Psychiatric:        Mood and Affect: Mood normal.      ____________________________________________   LABS (all labs ordered are listed, but only abnormal results are displayed)  Labs Reviewed  CBC WITH DIFFERENTIAL/PLATELET - Abnormal; Notable for the following components:      Result Value   WBC 20.2 (*)    RBC 3.61 (*)    Neutro Abs 18.9 (*)    Lymphs Abs 0.3 (*)    Abs Immature Granulocytes 0.23 (*)    All other components within normal limits  COMPREHENSIVE METABOLIC PANEL - Abnormal; Notable for the following components:   Sodium 130 (*)    Potassium 3.3 (*)    Chloride 94 (*)    Glucose, Bld 145 (*)    Calcium 8.7 (*)    Total Bilirubin 1.8 (*)    GFR calc non Af Amer 55 (*)    All other components within normal limits  LACTIC ACID, PLASMA - Abnormal; Notable for the following components:   Lactic Acid, Venous 2.2 (*)    All other components within normal limits  URINALYSIS, COMPLETE (UACMP) WITH MICROSCOPIC - Abnormal; Notable for the following components:   Color, Urine AMBER (*)    APPearance CLOUDY (*)    Hgb urine dipstick MODERATE (*)    Ketones, ur 20 (*)    Protein, ur 100 (*)    All other components within normal limits  MAGNESIUM - Abnormal; Notable for the following components:   Magnesium 1.2 (*)    All other components within normal limits  PROTIME-INR -  Abnormal; Notable for the following components:   Prothrombin Time 16.0 (*)    INR 1.3 (*)    All other components within normal limits  APTT - Abnormal; Notable for the following components:   aPTT 41 (*)    All other components within normal limits  SARS CORONAVIRUS 2 BY RT PCR (HOSPITAL ORDER, [place] [facility])  C DIFFICILE QUICK SCREEN W PCR REFLEX  GASTROINTESTINAL PANEL BY PCR, STOOL (REPLACES STOOL CULTURE)  URINE CULTURE  CULTURE, BLOOD (ROUTINE X 2)  CULTURE, BLOOD (ROUTINE X 2)  LIPASE, BLOOD  LACTIC ACID, PLASMA   ____________________________________________  EKG  Sinus rhythm with a ventricular of 90, normal axis, unremarkable intervals, somewhat poor tracing in V1 with no clear evidence of acute ischemia or other significant underlying arrhythmia.  There is nonspecific ST change in V6 which could reflect artifact. ____________________________________________  RADIOLOGY   Official radiology report(s): DG Chest 1 View  Result Date: 02/12/2020 CLINICAL DATA:  Tachypnea EXAM: CHEST  1 VIEW COMPARISON:  10/11/2017 FINDINGS: Cardiac shadow is stable. Aortic calcifications are again noted. The overall inspiratory effort is poor with mild left basilar atelectasis. No pneumothorax or effusion is seen. Old healed rib fractures are noted on the left with degenerative changes of the shoulder joints and thoracic spine. IMPRESSION: Mild left basilar atelectasis.  No other focal abnormality is seen. Electronically Signed   By: Inez Catalina M.D.   On: 02/12/2020 02:02   CT ABDOMEN PELVIS W CONTRAST  Result Date: 02/12/2020 CLINICAL DATA:  Acute, nonlocalized abdominal pain. Nausea and vomiting. EXAM: CT ABDOMEN AND PELVIS WITH CONTRAST TECHNIQUE: Multidetector CT imaging of the abdomen and pelvis was  performed using the standard protocol following bolus administration of intravenous contrast. CONTRAST:  132mL OMNIPAQUE IOHEXOL 300 MG/ML  SOLN COMPARISON:  None.  FINDINGS: Lower chest: The visualized lung bases are clear bilaterally. The visualized heart and pericardium are unremarkable. Hepatobiliary: The gallbladder is distended and there is extensive pericholecystic inflammatory stranding identified in keeping with changes of acute cholecystitis. Moreover, there are areas of discontinuous mural enhancement and loculated pericholecystic fluid and intramural fluid in keeping with changes of gangrenous cholecystitis. No obstructing calculus is seen within the gallbladder neck best noted on coronal image # 42/5. Several additional layering gallstones are seen within the gallbladder lumen. The liver is unremarkable. No intra or extrahepatic biliary ductal dilation. Pancreas: Unremarkable Spleen: Unremarkable Adrenals/Urinary Tract: Adrenal glands are normal. The kidneys are unremarkable. Bladder is unremarkable. Stomach/Bowel: Small hiatal hernia. Stomach, small bowel, and large bowel are unremarkable. Appendix normal. No free intraperitoneal gas or fluid. Vascular/Lymphatic: Moderate aortic atherosclerotic calcification is present without evidence of aneurysm. 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 No pathologic adenopathy within the abdomen and pelvis. Reproductive: Uterus and bilateral adnexa are unremarkable. Other: Tiny fat containing umbilical hernia.  Rectum unremarkable. Musculoskeletal: Degenerative changes are seen within the lumbar spine. No lytic or blastic bone lesions. Remote superior endplate fracture of L3 noted. IMPRESSION: Gangrenous cholecystitis with areas of mural non enhancement, perforation, and intramural fluid collection. Obstructing calculus noted at the cystic duct. Surgical consultation is advised. Peripheral vascular disease. If there is clinical evidence of  chronic mesenteric ischemia or hemodynamically significant renal artery stenosis, CT arteriography may be more helpful for further evaluation. Aortic Atherosclerosis (ICD10-I70.0). These results will be called to the ordering clinician or representative by the Radiologist Assistant, and communication documented in the PACS or [employer]. Electronically Signed   By: Fidela Salisbury MD   On: 02/12/2020 03:35    ____________________________________________   PROCEDURES  Procedure(s) performed (including Critical Care):  .Critical Care Performed by: Lucrezia Starch, MD Authorized by: Lucrezia Starch, MD   Critical care provider statement:    Critical care time (minutes):  30   Critical care time was exclusive of:  Separately billable procedures and treating other patients   Critical care was necessary to treat or prevent imminent or life-threatening deterioration of the following conditions:  Sepsis and metabolic crisis   Critical care was time spent personally by me on the following activities:  Discussions with consultants, evaluation of patient's response to treatment, examination of patient, ordering and performing treatments and interventions, ordering and review of laboratory studies, ordering and review of radiographic studies, pulse oximetry, re-evaluation of patient's condition, obtaining history from patient or surrogate and review of old charts   ____________________________________________   INITIAL IMPRESSION / [place] / ED COURSE        Patient presents with above-stated history exam for assessment of several days of vomiting associated with diarrhea and abdominal pain.  Patient is febrile, has a slightly elevated temperature with otherwise stable vital signs arrival.  Exam as above.  Overall patient's history, exam, and work-up is concerning for sepsis with elevated lactic and white blood cell count with CT findings concerning for perforated gangrenous acute  cholecystitis with obstructing stone at the cystic duct.  Patient is also noted to have significant atherosclerotic disease but no other acute  intra-abdominal pathology including appendicitis, pyelonephritis, diverticulitis, or other acute process.  Chest x-ray does not show evidence of pneumonia and UA is not appear infected.  Patient is noted to be hypokalemic and hypomagnesemic and these were repleted.  In addition patient was given broad-spectrum antibiotics and IV fluids after blood and urine cultures were obtained.  Given above-noted CT findings I did consult general surgery service with Dr. Celine Ahr who recommended no acute surgical intervention and interventional radiology consult for possible percutaneous drainage as well as medicine admission.  I did reach out to interventional radiology who stated they would see the patient in the morning as this was not an emergency procedure.  We will plan to admit to medicine service.  ____________________________________________   FINAL CLINICAL IMPRESSION(S) / ED DIAGNOSES  Final diagnoses:  Sepsis, due to unspecified organism, unspecified whether acute organ dysfunction present ([place])  Cholecystitis with gangrene of gallbladder  Lactic acid acidosis  Leukopenia, unspecified type  Hypomagnesemia  Hypokalemia  Hyponatremia    Medications  lactated ringers infusion (has no administration in time range)  ceFEPIme (MAXIPIME) 2 g in sodium chloride 0.9 % 100 mL IVPB (2 g Intravenous New Bag/Given 02/12/20 0359)  metroNIDAZOLE (FLAGYL) IVPB 500 mg (has no administration in time range)  lactated ringers bolus 500 mL (0 mLs Intravenous Stopped 02/12/20 0315)  potassium chloride SA (KLOR-CON) CR tablet 40 mEq (40 mEq Oral Given 02/12/20 0227)  magnesium sulfate IVPB 2 g 50 mL (0 g Intravenous Stopped 02/12/20 0325)  lactated ringers bolus 1,000 mL (1,000 mLs Intravenous New Bag/Given 02/12/20 0359)  iohexol (OMNIPAQUE) 300 MG/ML solution 100 mL (100 mLs  Intravenous Contrast Given 02/12/20 5997)     ED Discharge Orders    None       Note:  This document was prepared using Dragon voice recognition software and may include unintentional dictation errors.   Lucrezia Starch, MD 02/12/20 [id number]

## 2020-02-12 NOTE — Progress Notes (Signed)
PHARMACY -  BRIEF ANTIBIOTIC NOTE   Pharmacy has received consult(s) for Cefepime from an ED provider.  The patient's profile has been reviewed for ht/wt/allergies/indication/available labs.    One time order(s) placed for Cefepime 2 gm IV X 1   Further antibiotics/pharmacy consults should be ordered by admitting physician if indicated.                       Thank you, Angeles Paolucci D 02/12/2020  3:36 AM

## 2020-02-12 NOTE — Progress Notes (Signed)
No charge progress note.   Teresa Meyer is a 86 y.o. female with medical history significant for HTN, hearing loss, recently diagnosed melanoma, who presented from the nursing home with concerns for nausea vomiting, intermittent for the past 2 weeks.  Patient resides in assisted living at [facility] and over the course of the past 2 weeks received IV hydration on the nursing home side of the facility where she improved and was transferred back to ALF 3 days prior but symptoms returned and became acutely worse in the past 24 hours, with associated diarrhea, poor appetite, shaking chills and severe crampy upper abdominal pain and protracted weakness.  She denies cough or shortness of breath or dysuria.  On arrival she was febrile at temperature of 100, tachycardic and mildly tachypneic with leukocytosis and lactic acidosis.  CT abdomen concerning for gangrenous cholecystitis.  General surgery was consulted from ED and they recommend percutaneous drainage placement by IR which was placed today.  Patient tolerated the procedure well.  Cultures were sent.  Pending results.  Patient was feeling little better when seen today.  Pain is improved. -Continue cefepime and Flagyl. -Start her on clear liquid diet after the procedure. -Follow-up culture results.  Updated the daughter on phone.

## 2020-02-12 NOTE — Telephone Encounter (Signed)
Patient's daughter called stating that her mom is at the hospital because she had a problem with her gallbladder. Patient's daughter stated that she wanted to make Dr. Danise Mina knows about her mom. Jeani Hawking stated that they plan on her mom going back to [facility] and will need to be in skilled nursing. Jeani Hawking wants to make sure that her mom can go back to [facility] and wanted for Dr. Danise Mina to do whatever is needed for her to be in skilled nursing. Advised Jeani Hawking to check with [facility] and let Dr. Danise Mina know what is needed. Advised Jeani Hawking message will go to Dr. Danise Mina.

## 2020-02-12 NOTE — ED Notes (Signed)
Pt states that she needs to have a bowel movement. This RN placed patient on bed pan at this time, purwick still in place. This RN able to obtain stool sample at this time. Pt states she doesn't like the purwick, this RN removes purwick from patient, cleans patient, and places her in new brief. Pt clean/dry at this time, blankets placed on her and patient comfortable at this time. No further needs noted.

## 2020-02-12 NOTE — ED Notes (Signed)
Right flank site looks good. No bloody drainage noted. Pt has brown thick drainage in the drainage pouch.

## 2020-02-12 NOTE — Consult Note (Signed)
Chief Complaint: Patient was seen in consultation today for CT-guided percutaneous cholecystostomy Chief Complaint  Patient presents with  . Abdominal Pain  *  Referring Physician(s): [place]  Supervising Physician: Aletta Edouard  Patient Status: [facility] - ED  History of Present Illness: Teresa Meyer is a 86 y.o. female with past medical history significant for hypertension, hearing loss, recently diagnosed melanoma, diverticulosis, esophageal stricture, GERD, hyperlipidemia, squamous cell skin cancer and arthritis.  She presented to [facility] ED earlier this morning with several day history of intermittent nausea, vomiting, low grade temp elevation, and abdominal pain that worsened over the past 24 hours.  Subsequent imaging has revealed :   Gangrenous cholecystitis with areas of mural non enhancement, perforation, and intramural fluid collection. Obstructing calculus noted at the cystic duct. Surgical consultation is advised.  Peripheral vascular disease. If there is clinical evidence of chronic mesenteric ischemia or hemodynamically significant renal artery stenosis, CT arteriography may be more helpful for further evaluation.  Aortic Atherosclerosis   Current labs include WBC 20.2, hemoglobin 12.1, platelets 284k, PT 16, INR 1.3, lactic acid 2.9, potassium 3.3, creatinine 0.89, total bilirubin 1.8, COVID-19 negative.  Patient is a poor surgical candidate and request now received for percutaneous cholecystostomy.  Past Medical History:  Diagnosis Date  . Arthritis   . Atypical nevi 09/2014   lentiginous dysplastic nevus with mod atypia ([place])  . Basal cell carcinoma 08/12/2017   R distal dorsum nose   . Diverticulitis   . DNR (do not resuscitate)   . Esophageal stricture 2012  . GERD (gastroesophageal reflux disease)   . HLD (hyperlipidemia)    did not tolerate statin, stopped checking  . HTN (hypertension)   . Leg wound, left, subsequent encounter  11/12/2017  . Malignant melanoma ([place]) 2015, 2017   R chin s/p excision ([place]) R upper back [name])  . Malignant melanoma ([place]) 07/27/19 WLE at [facility]   R lat deltoid  . Osteoporosis, post-menopausal    bisphosphonate caused dysphagia, requests defer DEXA for now  . Seasonal allergies   . Squamous cell carcinoma of skin 08/21/2018   L med leg below knee  . Squamous cell carcinoma of skin 01/20/2017   R pretibial lateral  . Wears hearing aid     Past Surgical History:  Procedure Laterality Date  . BUNIONECTOMY Right 2001  . CARPAL TUNNEL RELEASE Bilateral 1974  . CATARACT EXTRACTION Bilateral    Ding[place]ien  . TONSILLECTOMY  1928    Allergies: Bisphosphonates, Codeine, Statins, Sulfa antibiotics, and Procaine hcl  Medications: Prior to Admission medications   Medication Sig Start Date End Date Taking? Authorizing Provider  calcium carbonate (OS-CAL) 1250 (500 Ca) MG chewable tablet Chew by mouth.    [provider]  calcium carbonate (TUMS - DOSED IN MG ELEMENTAL CALCIUM) 500 MG chewable tablet Chew 1 tablet by mouth 2 (two) times daily with a meal.    [provider]  cholecalciferol (VITAMIN D) 1000 UNITS tablet Take 1,000 Units by mouth daily.    [provider]  diphenhydramine-acetaminophen (TYLENOL PM) 25-500 MG TABS Take 2 tablets by mouth at bedtime as needed.    [provider]  nepafenac (ILEVRO) 0.3 % ophthalmic suspension SHAKE LIQUID AND INSTILL 1 DROP IN LEFT EYE EVERY DAY 06/12/19   [provider]  prednisoLONE acetate (PRED MILD) 0.12 % ophthalmic suspension Place 1 drop into the left eye daily.     [provider]     Family History  Problem Relation  Age of Onset  . Pancreatic cancer Son   . Breast cancer Sister   . Lung cancer Mother   . Diabetes Other        nephew  . Stroke Neg Hx   . CAD Neg Hx     Social History   Socioeconomic History  . Marital status: Widowed    Spouse name: Not on file  .  Number of children: 3  . Years of education: Not on file  . Highest education level: Not on file  Occupational History  . Occupation: travel agent/retired  Tobacco Use  . Smoking status: Never Smoker  . Smokeless tobacco: Never Used  Vaping Use  . Vaping Use: Never used  Substance and Sexual Activity  . Alcohol use: Yes    Alcohol/week: 0.0 standard drinks    Comment: 1/2 glass wine nightly  . Drug use: No  . Sexual activity: Never  Other Topics Concern  . Not on file  Social History Narrative   Lives alone at [place] - husband died of lung cancer   Daughter in [place], daughter in [place]: travel agency, retired   Edu: college   Activity: 1 mile walking, swimming, Charity fundraiser   Diet: seldom water, fruits/vegetables daily   Social Determinants of Health   Financial Resource Strain:   . Difficulty of Paying Living Expenses: Not on file  Food Insecurity:   . Worried About Charity fundraiser in the Last Year: Not on file  . Ran Out of Food in the Last Year: Not on file  Transportation Needs:   . Lack of Transportation (Medical): Not on file  . Lack of Transportation (Non-Medical): Not on file  Physical Activity:   . Days of Exercise per Week: Not on file  . Minutes of Exercise per Session: Not on file  Stress:   . Feeling of Stress : Not on file  Social Connections:   . Frequency of Communication with Friends and Family: Not on file  . Frequency of Social Gatherings with Friends and Family: Not on file  . Attends Religious Services: Not on file  . Active Member of Clubs or Organizations: Not on file  . Attends Archivist Meetings: Not on file  . Marital Status: Not on file      Review of Systems see above  Vital Signs: BP 128/67   Pulse 79   Temp 100 F (37.8 C) (Oral)   Resp (!) 22   Ht 5' (1.524 m)   Wt 145 lb (65.8 kg)   SpO2 92%   BMI 28.32 kg/m   Physical Exam awake, very hard of hearing.  Chest clear to  auscultation bilaterally anteriorly.  Heart with RRR;  abdomen soft, few bowel sounds, tender right upper quadrant to palpation.  No significant lower extremity edema.  Imaging: DG Chest 1 View  Result Date: 02/12/2020 CLINICAL DATA:  Tachypnea EXAM: CHEST  1 VIEW COMPARISON:  10/11/2017 FINDINGS: Cardiac shadow is stable. Aortic calcifications are again noted. The overall inspiratory effort is poor with mild left basilar atelectasis. No pneumothorax or effusion is seen. Old healed rib fractures are noted on the left with degenerative changes of the shoulder joints and thoracic spine. IMPRESSION: Mild left basilar atelectasis.  No other focal abnormality is seen. Electronically Signed   By: Inez Catalina M.D.   On: 02/12/2020 02:02   CT ABDOMEN PELVIS W CONTRAST  Result Date: 02/12/2020 CLINICAL DATA:  Acute, nonlocalized  abdominal pain. Nausea and vomiting. EXAM: CT ABDOMEN AND PELVIS WITH CONTRAST TECHNIQUE: Multidetector CT imaging of the abdomen and pelvis was performed using the standard protocol following bolus administration of intravenous contrast. CONTRAST:  1109mL OMNIPAQUE IOHEXOL 300 MG/ML  SOLN COMPARISON:  None. FINDINGS: Lower chest: The visualized lung bases are clear bilaterally. The visualized heart and pericardium are unremarkable. Hepatobiliary: The gallbladder is distended and there is extensive pericholecystic inflammatory stranding identified in keeping with changes of acute cholecystitis. Moreover, there are areas of discontinuous mural enhancement and loculated pericholecystic fluid and intramural fluid in keeping with changes of gangrenous cholecystitis. No obstructing calculus is seen within the gallbladder neck best noted on coronal image # 42/5. Several additional layering gallstones are seen within the gallbladder lumen. The liver is unremarkable. No intra or extrahepatic biliary ductal dilation. Pancreas: Unremarkable Spleen: Unremarkable Adrenals/Urinary Tract: Adrenal glands  are normal. The kidneys are unremarkable. Bladder is unremarkable. Stomach/Bowel: Small hiatal hernia. Stomach, small bowel, and large bowel are unremarkable. Appendix normal. No free intraperitoneal gas or fluid. Vascular/Lymphatic: Moderate aortic atherosclerotic calcification is present without evidence of aneurysm. 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 No pathologic adenopathy within the abdomen and pelvis. Reproductive: Uterus and bilateral adnexa are unremarkable. Other: Tiny fat containing umbilical hernia.  Rectum unremarkable. Musculoskeletal: Degenerative changes are seen within the lumbar spine. No lytic or blastic bone lesions. Remote superior endplate fracture of L3 noted. IMPRESSION: Gangrenous cholecystitis with areas of mural non enhancement, perforation, and intramural fluid collection. Obstructing calculus noted at the cystic duct. Surgical consultation is advised. Peripheral vascular disease. If there is clinical evidence of chronic mesenteric ischemia or hemodynamically significant renal artery stenosis, CT arteriography may be more helpful for further evaluation. Aortic Atherosclerosis (ICD10-I70.0). These results will be called to the ordering clinician or representative by the Radiologist Assistant, and communication documented in the PACS or [employer]. Electronically Signed   By: Fidela Salisbury MD   On: 02/12/2020 03:35    Labs:  CBC: Recent Labs    04/25/19 1329 02/12/20 0118  WBC 5.0 20.2*  HGB 12.9 12.1  HCT 38.3 36.0  PLT 235.0 284    COAGS: Recent Labs    02/12/20 0118  INR 1.3*  APTT 41*    BMP: Recent Labs    04/25/19 1329 02/12/20 0118  NA 138 130*  K 4.2 3.3*  CL 103 94*  CO2 29 23  GLUCOSE 99 145*  BUN 14 12  CALCIUM 10.1 8.7*  CREATININE 0.82 0.89    GFRNONAA  --  55*  GFRAA  --  >60    LIVER FUNCTION TESTS: Recent Labs    04/25/19 1329 02/12/20 0118  BILITOT 0.6 1.8*  AST 18 18  ALT 11 11  ALKPHOS 53 56  PROT 7.0 7.1  ALBUMIN 4.4 3.5    TUMOR MARKERS: No results for input(s): AFPTM, CEA, CA199, CHROMGRNA in the last 8760 hours.  Assessment and Plan: 86 y.o. female with past medical history significant for hypertension, hearing loss, recently diagnosed melanoma, diverticulosis, esophageal stricture, GERD, hyperlipidemia, squamous cell skin cancer and arthritis.  She presented to [facility] ED earlier this morning with several day history of intermittent nausea, vomiting, low grade temp elevation, and abdominal pain that worsened over the past 24 hours.  Subsequent imaging has revealed :  Gangrenous cholecystitis with areas of mural non enhancement, perforation, and intramural fluid collection. Obstructing calculus noted at the cystic duct. Surgical consultation is advised.  Peripheral vascular disease. If there is clinical evidence of chronic mesenteric ischemia or hemodynamically significant renal artery stenosis, CT arteriography may be more helpful for further evaluation.  Aortic Atherosclerosis   Current labs include WBC 20.2, hemoglobin 12.1, platelets 284k, PT 16, INR 1.3, lactic acid 2.9, potassium 3.3, creatinine 0.89, total bilirubin 1.8, COVID-19 negative.  Patient is a poor surgical candidate and request now received for percutaneous cholecystostomy.Risks and benefits discussed with the patient/daughter Teresa Meyer including bleeding, infection, damage to adjacent structures,  and sepsis.  All of the patient's questions were answered, patient is agreeable to proceed. Consent signed and in chart.  Procedure scheduled for today   Thank you for this interesting consult.  I greatly enjoyed meeting Teresa Meyer and look forward to participating in their care.  A copy of this report was sent to the  requesting provider on this date.  Electronically Signed: D. Rowe Robert, PA-C 02/12/2020, 9:43 AM   I spent a total of 25 minutes    in face to face in clinical consultation, greater than 50% of which was counseling/coordinating care for CT-guided percutaneous cholecystostomy

## 2020-02-12 NOTE — ED Notes (Signed)
Pt with liquid yellow stool x 2. Cleaned and changed.

## 2020-02-12 NOTE — Sepsis Progress Note (Signed)
Notified bedside nurse of need to draw repeat lactic acid since Lactates are trending up.

## 2020-02-12 NOTE — Procedures (Signed)
Interventional Radiology Procedure Note  Procedure: Percutaneous cholecystostomy tube placement  Complications: None  Estimated Blood Loss: < 10 mL  Findings: Grossly purulent bile in GB lumen. Sample sent for culture.  10 Fr drain placed in GB and attached to gravity bag.  Venetia Night. Kathlene Cote, M.D Pager:  [phone]

## 2020-02-12 NOTE — Consult Note (Addendum)
[facility] SURGICAL CONSULTATION NOTE (initial) - cpt: 27035   HISTORY OF PRESENT ILLNESS (HPI):  86 y.o. female presented to [facility] ED today for evaluation of abdominal pain. Patient is hard of hearing which limits history, and this was obtained primarily through chart review and discussion with members of medical team. She presents from ALF secondary to reports of nausea and emesis intermittently over the course of the last few weeks. She has been getting IVF support at her assisted living facility however her condition acutely worsened in the last 24 hours. She had upper abdominal pain, chills, diarrhea, and decreased appetite which prompted visit to the ED. This morning, she denied any abdominal pain and was uncertain why she had come to the ED. Further history limited. She does not appear to have any previous intra-abdominal surgeries. Work up in the ED was concerning for temperature to 100F, leukocytosis to 20K, hypokalemia to 3.3, hypomagnesemia to 1.2, renal function was normal with sCr - 0.89, lactic acidosis to 2.2 (trending: 2.2 --> 2.7 --> 2.8), stool panel and C diff were negative, and CT Abdomen/Pelvis was concerning for significant cholecystitis. She was admitted to the medicine service and IR consulted for percutaneous cholecystostomy tube placement.    Surgery is consulted by emergency medicine physician Dr. Hulan Saas, MD in this context for evaluation and management of likely gangrenous cholecystitis.   PAST MEDICAL HISTORY (PMH):  Past Medical History:  Diagnosis Date  . Arthritis   . Atypical nevi 09/2014   lentiginous dysplastic nevus with mod atypia ([place])  . Basal cell carcinoma 08/12/2017   R distal dorsum nose   . Diverticulitis   . DNR (do not resuscitate)   . Esophageal stricture 2012  . GERD (gastroesophageal reflux disease)   . HLD (hyperlipidemia)    did not tolerate statin, stopped checking  . HTN (hypertension)   . Leg wound, left,  subsequent encounter 11/12/2017  . Malignant melanoma ([place]) 2015, 2017   R chin s/p excision ([place]) R upper back [name])  . Malignant melanoma ([place]) 07/27/19 WLE at [facility]   R lat deltoid  . Osteoporosis, post-menopausal    bisphosphonate caused dysphagia, requests defer DEXA for now  . Seasonal allergies   . Squamous cell carcinoma of skin 08/21/2018   L med leg below knee  . Squamous cell carcinoma of skin 01/20/2017   R pretibial lateral  . Wears hearing aid      PAST SURGICAL HISTORY ([place]):  Past Surgical History:  Procedure Laterality Date  . BUNIONECTOMY Right 2001  . CARPAL TUNNEL RELEASE Bilateral 1974  . CATARACT EXTRACTION Bilateral    Ding[place]ien  . TONSILLECTOMY  1928     MEDICATIONS:  Prior to Admission medications   Medication Sig Start Date End Date Taking? Authorizing Provider  calcium carbonate (OS-CAL) 1250 (500 Ca) MG chewable tablet Chew by mouth.    [provider]  calcium carbonate (TUMS - DOSED IN MG ELEMENTAL CALCIUM) 500 MG chewable tablet Chew 1 tablet by mouth 2 (two) times daily with a meal.    [provider]  cholecalciferol (VITAMIN D) 1000 UNITS tablet Take 1,000 Units by mouth daily.    [provider]  diphenhydramine-acetaminophen (TYLENOL PM) 25-500 MG TABS Take 2 tablets by mouth at bedtime as needed.    [provider]  nepafenac (ILEVRO) 0.3 % ophthalmic suspension SHAKE LIQUID AND INSTILL 1 DROP IN LEFT EYE EVERY DAY 06/12/19   [provider]  prednisoLONE acetate (PRED MILD) 0.12 %  ophthalmic suspension Place 1 drop into the left eye daily.     [provider]     ALLERGIES:  Allergies  Allergen Reactions  . Bisphosphonates Other (See Comments)    Dysphagia to oral bisphosphonates  . Codeine   . Statins Other (See Comments)    Muscle aches  . Sulfa Antibiotics     Can't recall reaction  . Procaine Hcl Palpitations     SOCIAL HISTORY:  Social History   Socioeconomic History   . Marital status: Widowed    Spouse name: Not on file  . Number of children: 3  . Years of education: Not on file  . Highest education level: Not on file  Occupational History  . Occupation: travel agent/retired  Tobacco Use  . Smoking status: Never Smoker  . Smokeless tobacco: Never Used  Vaping Use  . Vaping Use: Never used  Substance and Sexual Activity  . Alcohol use: Yes    Alcohol/week: 0.0 standard drinks    Comment: 1/2 glass wine nightly  . Drug use: No  . Sexual activity: Never  Other Topics Concern  . Not on file  Social History Narrative   Lives alone at [place] - husband died of lung cancer   Daughter in [place], daughter in [place]: travel agency, retired   Edu: college   Activity: 1 mile walking, swimming, Charity fundraiser   Diet: seldom water, fruits/vegetables daily   Social Determinants of Health   Financial Resource Strain:   . Difficulty of Paying Living Expenses: Not on file  Food Insecurity:   . Worried About Charity fundraiser in the Last Year: Not on file  . Ran Out of Food in the Last Year: Not on file  Transportation Needs:   . Lack of Transportation (Medical): Not on file  . Lack of Transportation (Non-Medical): Not on file  Physical Activity:   . Days of Exercise per Week: Not on file  . Minutes of Exercise per Session: Not on file  Stress:   . Feeling of Stress : Not on file  Social Connections:   . Frequency of Communication with Friends and Family: Not on file  . Frequency of Social Gatherings with Friends and Family: Not on file  . Attends Religious Services: Not on file  . Active Member of Clubs or Organizations: Not on file  . Attends Archivist Meetings: Not on file  . Marital Status: Not on file  Intimate Partner Violence:   . Fear of Current or Ex-Partner: Not on file  . Emotionally Abused: Not on file  . Physically Abused: Not on file  . Sexually Abused: Not on file     FAMILY HISTORY:   Family History  Problem Relation Age of Onset  . Pancreatic cancer Son   . Breast cancer Sister   . Lung cancer Mother   . Diabetes Other        nephew  . Stroke Neg Hx   . CAD Neg Hx       REVIEW OF SYSTEMS:  Review of Systems  Unable to perform ROS: Mental acuity  Constitutional: Positive for malaise/fatigue.  Gastrointestinal: Positive for abdominal pain and diarrhea.  Neurological: Positive for weakness.    VITAL SIGNS:  Temp:  [100 F (37.8 C)] 100 F (37.8 C) (09/21 0114) Pulse Rate:  [9-94] 79 (09/21 0700) Resp:  [17-39] 22 (09/21 0700) BP: (104-139)/(48-80) 128/67 (09/21 0700) SpO2:  [91 %-100 %] 92 % (  09/21 0700) Weight:  [65.8 kg] 65.8 kg (09/21 0112)     Height: 5' (152.4 cm) Weight: 65.8 kg BMI (Calculated): 28.32   INTAKE/OUTPUT:  09/20 0701 - 09/21 0700 In: 550 [IV Piggyback:550] Out: -   PHYSICAL EXAM:  Physical Exam Vitals and nursing note reviewed. Exam conducted with a chaperone present.  Constitutional:      General: She is not in acute distress.    Appearance: She is well-developed. She is obese. She is not ill-appearing.  HENT:     Head: Normocephalic and atraumatic.  Eyes:     General: No scleral icterus.    Extraocular Movements: Extraocular movements intact.  Cardiovascular:     Rate and Rhythm: Normal rate.  Pulmonary:     Effort: Pulmonary effort is normal. No respiratory distress.     Breath sounds: Normal breath sounds.  Abdominal:     General: Abdomen is flat. There is no distension.     Palpations: Abdomen is soft.     Tenderness: There is abdominal tenderness in the right upper quadrant and epigastric area. There is no guarding or rebound.  Genitourinary:    Comments: Deferred Neurological:     General: No focal deficit present.     Mental Status: She is alert.  Psychiatric:        Mood and Affect: Mood normal.        Behavior: Behavior normal.      Labs:  CBC Latest Ref Rng & Units 02/12/2020 04/25/2019 10/27/2018  WBC  4.0 - 10.5 K/uL 20.2(H) 5.0 4.2  Hemoglobin 12.0 - 15.0 g/dL 12.1 12.9 13.1  Hematocrit 36 - 46 % 36.0 38.3 -  Platelets 150 - 400 K/uL 284 235.0 250   CMP Latest Ref Rng & Units 02/12/2020 04/25/2019 10/27/2018  Glucose 70 - 99 mg/dL 145(H) 99 -  BUN 8 - 23 mg/dL 12 14 -  Creatinine 0.44 - 1.00 mg/dL 0.89 0.82 0.9  Sodium 135 - 145 mmol/L 130(L) 138 -  Potassium 3.5 - 5.1 mmol/L 3.3(L) 4.2 3.7  Chloride 98 - 111 mmol/L 94(L) 103 -  CO2 22 - 32 mmol/L 23 29 -  Calcium 8.9 - 10.3 mg/dL 8.7(L) 10.1 -  Total Protein 6.5 - 8.1 g/dL 7.1 7.0 -  Total Bilirubin 0.3 - 1.2 mg/dL 1.8(H) 0.6 -  Alkaline Phos 38 - 126 U/L 56 53 55  AST 15 - 41 U/L 18 18 19   ALT 0 - 44 U/L 11 11 10      Imaging studies:   CT Abdomen/Pelvis (02/12/2020) personally reviewed with significant pericholecystic swelling, fluid in the gallbladder wall, and cholelithiasis concerning for significant cholecystitis concerning for possible gangrenous cholecystitis, and radiologist report reviewed:  IMPRESSION: Gangrenous cholecystitis with areas of mural non enhancement, perforation, and intramural fluid collection. Obstructing calculus noted at the cystic duct. Surgical consultation is advised.  Peripheral vascular disease. If there is clinical evidence of chronic mesenteric ischemia or hemodynamically significant renal artery stenosis, CT arteriography may be more helpful for further evaluation.   Assessment/Plan: (ICD-10's: K81.0) 86 y.o. female with leukocytosis, lactic acidosis, and upper abdominal pain concerning for acute, likely gangrenous, cholecystitis based on radiographic findings, complicated by pertinent comorbidities including advanced age, hard of hearing, HTN, DM.   - Given severity of cholecystitis on imaging concomitant with age and comorbid conditions, she is a poor surgical candidate. She will benefit from percutaneous cholecystostomy; IR following  - NPO + IVF resuscitation  - Continue IV ABx  (Cefepime + Flagyl)  -  Monitor abdominal pain; on-going bowel function  - Pain control prn; antiemetics prn  - No surgical intervention  - Monitor leukocytosis; electrolytes; lactic acidosis  - Mobilization if feasible  - Further management per primary service; we will follow   - DVT prophylaxis; hold for IR  All of the above findings and recommendations were discussed with the patient, and all of patient's questions were answered to her expressed satisfaction.  Thank you for the opportunity to participate in this patient's care.   -- Edison Simon, PA-C [facility] 02/12/2020, 7:48 AM [phone] M-F: 7am - 4pm  Patient in radiology getting perc chole tube at the time I attempted to see her.

## 2020-02-12 NOTE — ED Notes (Signed)
Pt reports pain to the right flank and the right neck. Pain meds given as ordered.

## 2020-02-12 NOTE — Progress Notes (Signed)
86 y/o F presented to the ED from her SNF with 2 weeks of abdominal pain, accompanied by n/v. ED evaluation showed moderate electrolyte derangements, mildly elevated lactic acid, and elevated WBC. CT abdomen/pelvis consistent with acute, likely gangrenous, cholecystitis.  In light of her advanced age, initial impression suggests that she would best benefit from medicine admission, with IV abx, resuscitation, and IR consultation for percutaneous cholecystostomy tube.  Full consultation to follow, which may alter these initial recommendations.

## 2020-02-12 NOTE — ED Notes (Signed)
Daughter updated. Jocelyn Lamer RN from specials en route to pick up pt for procedure.

## 2020-02-13 DIAGNOSIS — Z7189 Other specified counseling: Secondary | ICD-10-CM

## 2020-02-13 DIAGNOSIS — E871 Hypo-osmolality and hyponatremia: Secondary | ICD-10-CM

## 2020-02-13 DIAGNOSIS — I10 Essential (primary) hypertension: Secondary | ICD-10-CM

## 2020-02-13 DIAGNOSIS — K82A1 Gangrene of gallbladder in cholecystitis: Secondary | ICD-10-CM

## 2020-02-13 DIAGNOSIS — Z515 Encounter for palliative care: Secondary | ICD-10-CM

## 2020-02-13 DIAGNOSIS — K810 Acute cholecystitis: Secondary | ICD-10-CM

## 2020-02-13 DIAGNOSIS — Z66 Do not resuscitate: Secondary | ICD-10-CM

## 2020-02-13 LAB — BLOOD CULTURE ID PANEL (REFLEXED) - BCID2

## 2020-02-13 LAB — COMPREHENSIVE METABOLIC PANEL
ALT: 9 U/L (ref 0–44)
AST: 13 U/L — ABNORMAL LOW (ref 15–41)
Albumin: 2.5 g/dL — ABNORMAL LOW (ref 3.5–5.0)
Alkaline Phosphatase: 45 U/L (ref 38–126)
Anion gap: 8 (ref 5–15)
BUN: 11 mg/dL (ref 8–23)
CO2: 25 mmol/L (ref 22–32)
Calcium: 7.9 mg/dL — ABNORMAL LOW (ref 8.9–10.3)
Chloride: 101 mmol/L (ref 98–111)
Creatinine, Ser: 0.73 mg/dL (ref 0.44–1.00)
GFR calc Af Amer: >60 mL/min (ref >60)
GFR calc non Af Amer: >60 mL/min (ref >60)
Glucose, Bld: 98 mg/dL (ref 70–99)
Potassium: 3.8 mmol/L (ref 3.5–5.1)
Sodium: 134 mmol/L — ABNORMAL LOW (ref 135–145)
Total Bilirubin: 1.1 mg/dL (ref 0.3–1.2)
Total Protein: 5.7 g/dL — ABNORMAL LOW (ref 6.5–8.1)

## 2020-02-13 LAB — MAGNESIUM: Magnesium: 1.7 mg/dL (ref 1.7–2.4)

## 2020-02-13 LAB — CBC
HCT: 29 % — ABNORMAL LOW (ref 36.0–46.0)
Hemoglobin: 9.9 g/dL — ABNORMAL LOW (ref 12.0–15.0)
MCH: 33.6 pg (ref 26.0–34.0)
MCHC: 34.1 g/dL (ref 30.0–36.0)
MCV: 98.3 fL (ref 80.0–100.0)
Platelets: 243 10*3/uL (ref 150–400)
RBC: 2.95 MIL/uL — ABNORMAL LOW (ref 3.87–5.11)
RDW: 12.5 % (ref 11.5–15.5)
WBC: 10.7 10*3/uL — ABNORMAL HIGH (ref 4.0–10.5)
nRBC: 0 % (ref 0.0–0.2)

## 2020-02-13 LAB — PHOSPHORUS: Phosphorus: 2.8 mg/dL (ref 2.5–4.6)

## 2020-02-13 LAB — LACTIC ACID, PLASMA: Lactic Acid, Venous: 0.9 mmol/L (ref 0.5–1.9)

## 2020-02-13 MED ORDER — VITAMIN D 25 MCG (1000 UNIT) PO TABS
1000.0000 [IU] | ORAL_TABLET | Freq: Every day | ORAL | Status: DC
  Administered 2020-02-13 – 2020-02-15 (×3): 1000 [IU] via ORAL
  Filled 2020-02-13 (×3): qty 1

## 2020-02-13 MED ORDER — SODIUM CHLORIDE 0.9% FLUSH
10.0000 mL | Freq: Two times a day (BID) | INTRAVENOUS | Status: DC
  Administered 2020-02-14: 10 mL

## 2020-02-13 MED ORDER — NEPAFENAC 0.3 % OP SUSP
1.0000 [drp] | Freq: Every day | OPHTHALMIC | Status: DC
  Administered 2020-02-13 – 2020-02-15 (×2): 1 [drp] via OPHTHALMIC
  Filled 2020-02-13: qty 3

## 2020-02-13 MED ORDER — ENOXAPARIN SODIUM 40 MG/0.4ML ~~LOC~~ SOLN
40.0000 mg | SUBCUTANEOUS | Status: DC
  Administered 2020-02-13 – 2020-02-14 (×2): 40 mg via SUBCUTANEOUS
  Filled 2020-02-13 (×2): qty 0.4

## 2020-02-13 MED ORDER — PREDNISOLONE ACETATE 0.12 % OP SUSP
1.0000 [drp] | Freq: Every day | OPHTHALMIC | Status: DC
  Filled 2020-02-13: qty 5

## 2020-02-13 MED ORDER — SODIUM CHLORIDE 0.9% FLUSH
10.0000 mL | INTRAVENOUS | Status: DC | PRN
  Administered 2020-02-15: 10 mL

## 2020-02-13 MED ORDER — NEPAFENAC 0.3 % OP SUSP
1.0000 [drp] | Freq: Every day | OPHTHALMIC | Status: DC
  Filled 2020-02-13: qty 3

## 2020-02-13 NOTE — Progress Notes (Signed)
PT Cancellation Note  Patient Details Name: Teresa Meyer MRN: [id number] DOB: October 25, 1922   Cancelled Treatment:    Reason Eval/Treat Not Completed: Other (comment)  Patient requesting eye drops- called and notified nursing. She also wanted to use the bed pan for a BM and requested for PT to return later after she has used the bathroom.  Will re-attempt PT evaluation when patient is ready and able to participate.    Estes Lehner  PT, DPT 02/13/2020, 9:21 AM

## 2020-02-13 NOTE — Progress Notes (Signed)
OT Cancellation Note  Patient Details Name: Teresa Meyer MRN: [id number] DOB: 1922-08-05   Cancelled Treatment:    Reason Eval/Treat Not Completed: Patient declined, no reason specified. Order received and chart reviewed. Upon arrival to pt room, pt semi-supine in bed awake/alert. She requests therapist adjust thermostat as she is "freezing". Pt asks, "are you my therapist?" and states "I know myself and I just don't think I can do therapy today". Pt continues to politely decline OT services despite education. Requests therapist return next date. Will hold at this time and re-attempt next date as available and pt medically appropriate for OT tx session.   Shara Blazing, M.S., OTR/L Ascom: [phone] 02/13/20, 1:42 PM

## 2020-02-13 NOTE — Consult Note (Signed)
Consultation Note Date: 02/13/2020   Patient Name: Teresa Meyer  DOB: 04/23/1923  MRN: [id number]  Age / Sex: 86 y.o., female  PCP: Gutierrez, Javier, MD Referring Physician: Amin, Sumayya, MD  Reason for Consultation: Establishing goals of care  HPI/Patient Profile: 86 y.o. female  with past medical history of HTN, DM, and recently diagnosed melanoma admitted on 02/12/2020 with nausea and vomiting x2 weeks. CT abdomen concerning for gangrenous cholecystitis. Percutaneous drain placed 9/21. Now tolerating diet advancement. PMT consulted to discuss GOC.   Clinical Assessment and Goals of Care: I have reviewed medical records including EPIC notes, labs and imaging, assessed the patient and then met with patient and called her daughter, [name],  to discuss diagnosis prognosis, GOC, EOL wishes, disposition and options.  I introduced Palliative Medicine as specialized medical care for people living with serious illness. It focuses on providing relief from the symptoms and stress of a serious illness. The goal is to improve quality of life for both the patient and the family.  Patient's ability to participate in conversation is limited d/t HOH and does not have hearing aids.   As far as functional and nutritional status, [name] tells me the patient uses a walker for ambulation. She considers the patient to be fairly active. Walks across her home multiple times a day, works on her computer. Can care for herself independently. No problems with appetite. Some forgetfulness.    We discussed patient's current illness and what it means in the larger context of patient's on-going co-morbidities.  [name] expresses understanding of current illness.   I attempted to elicit values and goals of care important to the patient. [name] shares that patient's independence is most important to her - hopes she can regain function to return to her baseline. She is very  deconditioned right now d/t illness.   The difference between aggressive medical intervention and comfort care was considered in light of the patient's goals of care. Advance directives, concepts specific to code status, artificial feeding and hydration, and rehospitalization were considered and discussed. Patient has a living will expressing a desire for a natural death, she has a DNR, and she has HCPOA naming [name] as HCPOA.  Discussed with patient/family the importance of continued conversation with family and the medical providers regarding overall plan of care and treatment options, ensuring decisions are within the context of the patient's values and GOCs.    Palliative Care services outpatient were explained and offered. [name] is interested in outpatient palliative following at rehab.  Questions and concerns were addressed. The family was encouraged to call with questions or concerns.   Primary Decision Maker PATIENT joined by daughter [name] who is HCPOA    SUMMARY OF RECOMMENDATIONS   - goals are to return to rehab at [facility] with hopes to regain some function and return to ALF - living will, DNR, and HCPOA documented - outpatient palliative to follow at rehab  Code Status/Advance Care Planning:  DNR  Prognosis:   Unable to determine  Discharge Planning: Skilled Nursing Facility for rehab with Palliative care service follow-up      Primary Diagnoses: Present on Admission: . HTN (hypertension) . Hypercholesteremia . Malignant melanoma (HCC)   I have reviewed the medical record, interviewed the patient and family, and examined the patient. The following aspects are pertinent.  Past Medical History:  Diagnosis Date  . Arthritis   . Atypical nevi 09/2014   lentiginous dysplastic nevus with mod atypia ([place])  . Basal cell carcinoma   08/12/2017   R distal dorsum nose   . Diverticulitis   . DNR (do not resuscitate)   . Esophageal stricture 2012  . GERD  (gastroesophageal reflux disease)   . HLD (hyperlipidemia)    did not tolerate statin, stopped checking  . HTN (hypertension)   . Leg wound, left, subsequent encounter 11/12/2017  . Malignant melanoma ([place]) 2015, 2017   R chin s/p excision ([place]) R upper back [name])  . Malignant melanoma ([place]) 07/27/19 WLE at [facility]   R lat deltoid  . Osteoporosis, post-menopausal    bisphosphonate caused dysphagia, requests defer DEXA for now  . Seasonal allergies   . Squamous cell carcinoma of skin 08/21/2018   L med leg below knee  . Squamous cell carcinoma of skin 01/20/2017   R pretibial lateral  . Wears hearing aid    Social History   Socioeconomic History  . Marital status: Widowed    Spouse name: Not on file  . Number of children: 3  . Years of education: Not on file  . Highest education level: Not on file  Occupational History  . Occupation: travel agent/retired  Tobacco Use  . Smoking status: Never Smoker  . Smokeless tobacco: Never Used  Vaping Use  . Vaping Use: Never used  Substance and Sexual Activity  . Alcohol use: Yes    Alcohol/week: 0.0 standard drinks    Comment: 1/2 glass wine nightly  . Drug use: No  . Sexual activity: Never  Other Topics Concern  . Not on file  Social History Narrative   Lives alone at [place] - husband died of lung cancer   Daughter in [place], daughter in [place]: travel agency, retired   Edu: college   Activity: 1 mile walking, swimming, Charity fundraiser   Diet: seldom water, fruits/vegetables daily   Social Determinants of Health   Financial Resource Strain:   . Difficulty of Paying Living Expenses: Not on file  Food Insecurity:   . Worried About Charity fundraiser in the Last Year: Not on file  . Ran Out of Food in the Last Year: Not on file  Transportation Needs:   . Lack of Transportation (Medical): Not on file  . Lack of Transportation (Non-Medical): Not on file  Physical Activity:   . Days of Exercise per  Week: Not on file  . Minutes of Exercise per Session: Not on file  Stress:   . Feeling of Stress : Not on file  Social Connections:   . Frequency of Communication with Friends and Family: Not on file  . Frequency of Social Gatherings with Friends and Family: Not on file  . Attends Religious Services: Not on file  . Active Member of Clubs or Organizations: Not on file  . Attends Archivist Meetings: Not on file  . Marital Status: Not on file   Family History  Problem Relation Age of Onset  . Pancreatic cancer Son   . Breast cancer Sister   . Lung cancer Mother   . Diabetes Other        nephew  . Stroke Neg Hx   . CAD Neg Hx    Scheduled Meds: . cholecalciferol  1,000 Units Oral Daily  . enoxaparin (LOVENOX) injection  40 mg Subcutaneous Q24H  . nepafenac  1 drop Left Eye Daily  . prednisoLONE acetate  1 drop Left Eye Daily  . sodium chloride flush  5 mL Intracatheter Q8H   Continuous  Infusions: . ceFEPime (MAXIPIME) IV 2 g (02/13/20 0428)  . metronidazole 500 mg (02/13/20 1235)   PRN Meds:.morphine injection, ondansetron **OR** ondansetron (ZOFRAN) IV Allergies  Allergen Reactions  . Bisphosphonates Other (See Comments)    Dysphagia to oral bisphosphonates  . Codeine   . Statins Other (See Comments)    Muscle aches  . Sulfa Antibiotics     Can't recall reaction  . Procaine Hcl Palpitations   Review of Systems  Unable to perform ROS: Other  HOH  Physical Exam Constitutional:      General: She is not in acute distress. Pulmonary:     Effort: Pulmonary effort is normal. No respiratory distress.  Skin:    General: Skin is warm and dry.  Neurological:     Mental Status: She is alert and oriented to person, place, and time.     Vital Signs: BP 140/76 (BP Location: Right Arm)   Pulse 79   Temp 98.1 F (36.7 C) (Oral)   Resp 18   Ht 5' 2" (1.575 m)   Wt 61.9 kg   SpO2 94%   BMI 24.95 kg/m  Pain Scale: 0-10   Pain Score: 0-No pain   SpO2:  SpO2: 94 % O2 Device:SpO2: 94 % O2 Flow Rate: .O2 Flow Rate (L/min): 2 L/min  IO: Intake/output summary:   Intake/Output Summary (Last 24 hours) at 02/13/2020 1447 Last data filed at 02/13/2020 0700 Gross per 24 hour  Intake 1649.67 ml  Output 120 ml  Net 1529.67 ml    LBM: Last BM Date: 02/12/20 Baseline Weight: Weight: 65.8 kg Most recent weight: Weight: 61.9 kg     Palliative Assessment/Data: PPS 50%    Time Total: 60 minutes Greater than 50%  of this time was spent counseling and coordinating care related to the above assessment and plan.  Shae Lee Shaffer, DNP, AGNP-C Palliative Medicine Team [phone] Pager: [phone]  

## 2020-02-13 NOTE — Consult Note (Signed)
PHARMACY - PHYSICIAN COMMUNICATION CRITICAL VALUE ALERT - BLOOD CULTURE IDENTIFICATION (BCID)  Teresa Meyer is an 86 y.o. female who presented to [facility] on 02/12/2020 with a chief complaint of nausea vomiting, intermittent for the past 2 weeks  Assessment:  1 out of 4 bottles positive for GPC. BCID + for staph sp.    Name of physician (or Provider) Contacted: Randol Kern NP  Current antibiotics: cefepime and flagyl  Changes to prescribed antibiotics recommended: No change Recommendations accepted by provider  Results for orders placed or performed during the hospital encounter of 02/12/20  Blood Culture ID Panel (Reflexed) (Collected: 02/12/2020  1:17 AM)  Result Value Ref Range   Enterococcus faecalis NOT DETECTED NOT DETECTED   Enterococcus Faecium NOT DETECTED NOT DETECTED   Listeria monocytogenes NOT DETECTED NOT DETECTED   Staphylococcus species NOT DETECTED NOT DETECTED   Staphylococcus aureus (BCID) NOT DETECTED NOT DETECTED   Staphylococcus epidermidis NOT DETECTED NOT DETECTED   Staphylococcus lugdunensis NOT DETECTED NOT DETECTED   Streptococcus species DETECTED (A) NOT DETECTED   Streptococcus agalactiae NOT DETECTED NOT DETECTED   Streptococcus pneumoniae NOT DETECTED NOT DETECTED   Streptococcus pyogenes NOT DETECTED NOT DETECTED   A.calcoaceticus-baumannii NOT DETECTED NOT DETECTED   Bacteroides fragilis NOT DETECTED NOT DETECTED   Enterobacterales NOT DETECTED NOT DETECTED   Enterobacter cloacae complex NOT DETECTED NOT DETECTED   Escherichia coli NOT DETECTED NOT DETECTED   Klebsiella aerogenes NOT DETECTED NOT DETECTED   Klebsiella oxytoca NOT DETECTED NOT DETECTED   Klebsiella pneumoniae NOT DETECTED NOT DETECTED   Proteus species NOT DETECTED NOT DETECTED   Salmonella species NOT DETECTED NOT DETECTED   Serratia marcescens NOT DETECTED NOT DETECTED   Haemophilus influenzae NOT DETECTED NOT DETECTED   Neisseria meningitidis NOT DETECTED NOT DETECTED    Pseudomonas aeruginosa NOT DETECTED NOT DETECTED   Stenotrophomonas maltophilia NOT DETECTED NOT DETECTED   Candida albicans NOT DETECTED NOT DETECTED   Candida auris NOT DETECTED NOT DETECTED   Candida glabrata NOT DETECTED NOT DETECTED   Candida krusei NOT DETECTED NOT DETECTED   Candida parapsilosis NOT DETECTED NOT DETECTED   Candida tropicalis NOT DETECTED NOT DETECTED   Cryptococcus neoformans/gattii NOT DETECTED NOT DETECTED    Oswald Hillock 02/13/2020  9:34 PM

## 2020-02-13 NOTE — Progress Notes (Signed)
[place] [facility]) Hospital Liaison RN note:  Received new referral for [facility] out patient Palliative Program to follow post discharge to rehab at [facility] from [place], NP. Patient information given to referral. Roseanne Reno, TOC is aware.   We will follow for disposition and update our team.  Thank you for this referral.  Zandra Abts, RN [facility] Liaison [phone]

## 2020-02-13 NOTE — Progress Notes (Addendum)
[facility] SURGICAL PROGRESS NOTE (cpt [phone])  Hospital Day(s): 1.   Interval History: Patient seen and examined, no acute events or new complaints overnight. Patient reports her only soreness is at drain site in RUQ. She denies fever, chills, nausea, emesis. She is incredibly fixated that "she needs cleaned up this morning." Leukocytosis significantly improved this morning to 10.7K. She does have anemia with Hgb to 9.9 but this is almost certainly dilutional. Lactic acidosis also resolved. Remaining labs are pending. No new imaging. She underwent percutaneous cholecystostomy tube placement yesterday (09/21) with IR. Cultures from this are growing gram positive cocci and gram negative rods. Output recorded as 120 ccs in last 24 hours. This appears bilious. She is currently on CLD, tolerating well, having bowel function.   Review of Systems:  Constitutional: denies fever, chills  HEENT: denies cough or congestion  Respiratory: denies any shortness of breath  Cardiovascular: denies chest pain or palpitations  Gastrointestinal: denies abdominal pain, N/V, or diarrhea/and bowel function as per interval history Genitourinary: denies burning with urination or urinary frequency Musculoskeletal: denies pain, decreased motor or sensation  Vital signs in last 24 hours: [min-max] current  Temp:  [98.6 F (37 C)-100.2 F (37.9 C)] 98.6 F (37 C) (09/21 2023) Pulse Rate:  [79-100] 87 (09/21 2023) Resp:  [16-26] 17 (09/21 2023) BP: (107-155)/(47-88) 137/74 (09/21 2023) SpO2:  [92 %-100 %] 94 % (09/21 2023) Weight:  [61.6 kg-61.9 kg] 61.9 kg (09/22 0300)     Height: 5\' 2"  (157.5 cm) Weight: 61.9 kg BMI (Calculated): 24.94   Intake/Output last 2 shifts:  09/21 0701 - 09/22 0700 In: 1349.6 [I.V.:1349.6] Out: 120 [Drains:120]   Physical Exam:  Constitutional: alert, cooperative and no distress  HENT: normocephalic without obvious abnormality  Eyes: PERRL, EOM's grossly intact and  symmetric  Respiratory: breathing non-labored at rest  Cardiovascular: regular rate and sinus rhythm  Gastrointestinal: Soft, soreness over RUQ drain otherwise non-tender, and non-distended, no rebound/guarding. Cholecystostomy tube in RUQ with bilious output Musculoskeletal: no edema or wounds, motor and sensation grossly intact, NT    Labs:  CBC Latest Ref Rng & Units 02/12/2020 04/25/2019 10/27/2018  WBC 4.0 - 10.5 K/uL 20.2(H) 5.0 4.2  Hemoglobin 12.0 - 15.0 g/dL 12.1 12.9 13.1  Hematocrit 36 - 46 % 36.0 38.3 -  Platelets 150 - 400 K/uL 284 235.0 250   CMP Latest Ref Rng & Units 02/12/2020 04/25/2019 10/27/2018  Glucose 70 - 99 mg/dL 145(H) 99 -  BUN 8 - 23 mg/dL 12 14 -  Creatinine 0.44 - 1.00 mg/dL 0.89 0.82 0.9  Sodium 135 - 145 mmol/L 130(L) 138 -  Potassium 3.5 - 5.1 mmol/L 3.3(L) 4.2 3.7  Chloride 98 - 111 mmol/L 94(L) 103 -  CO2 22 - 32 mmol/L 23 29 -  Calcium 8.9 - 10.3 mg/dL 8.7(L) 10.1 -  Total Protein 6.5 - 8.1 g/dL 7.1 7.0 -  Total Bilirubin 0.3 - 1.2 mg/dL 1.8(H) 0.6 -  Alkaline Phos 38 - 126 U/L 56 53 55  AST 15 - 41 U/L 18 18 19   ALT 0 - 44 U/L 11 11 10      Imaging studies: No new pertinent imaging studies   Assessment/Plan: (ICD-10's: K81.0) 86 y.o. female with improvement in abdominal pain and leukocytosis as well as resolution in lactic acidosis admitted with likely gangrenous cholecystitis s/p percutaneous cholecystostomy tube placement (93/81), complicated by pertinent comorbidities including advanced age, hard of hearing, HTN, DM   - Okay to ADAT from surgical perspective  -  Maintain cholecystostomy tube; monitor and record output  - Continue IV ABx (Cefepime + Flagyl); follow up Cx results             - Monitor abdominal pain; on-going bowel function             - Pain control prn; antiemetics prn             - No surgical intervention             - Monitor leukocytosis; electrolytes; lactic acidosis             - Mobilization if feasible; likely benefit  from PT evaluation              - Further management per primary service; we will follow   All of the above findings and recommendations were discussed with the patient, and the medical team, and all of patient's questions were answered to her expressed satisfaction.  -- Edison Simon, PA-C [facility] 02/13/2020, 7:46 AM [phone] M-F: 7am - 4pm  I saw and evaluated the patient.  I agree with the above documentation, exam, and plan, which I have edited where appropriate. Fredirick Maudlin  11:54 AM

## 2020-02-13 NOTE — Progress Notes (Signed)
PROGRESS NOTE    Teresa Meyer  WPY:[id number] DOB: 25-Oct-1922 DOA: 02/12/2020 PCP: [name], MD   Brief Narrative:  Teresa L Delanyis a 86 y.o.femalewith medical history significant forHTN, hearing loss, recently diagnosed melanoma, who presented from the nursing home with concerns for nausea vomiting, intermittent for the past 2 weeks. Patient resides in assisted living at [facility] and over the course of the past 2 weeks received IV hydration on the nursing home side of the facility where she improved and was transferred back to ALF 3 days prior but symptoms returned and becameacutely worse in the past 24 hours, with associateddiarrhea, poor appetite, shaking chills and severe crampy upper abdominal painand protracted weakness. She denies cough or shortness of breath or dysuria. On arrival she was febrile at temperature of 100, tachycardic and mildly tachypneic with leukocytosis and lactic acidosis.  CT abdomen concerning for gangrenous cholecystitis.  General surgery was consulted from ED and they recommend percutaneous drainage placement by IR which was placed. Patient tolerated the procedure well.  Cultures were sent.  Pending final results.  Subjective: Patient is very hard of hearing.  Stating no pain but it is tender to touch.  Tolerating clear liquid well.  No nausea or vomiting.  Assessment & Plan:   Principal Problem:   Sepsis ([place]) Active Problems:   Hypercholesteremia   HTN (hypertension)   Malignant melanoma ([place])   Acute gangrenous cholecystitis   Hearing impairment   Goals of care, counseling/discussion   Palliative care by specialist  Sepsis secondary to acute gangrenous cholecystitis.  Drainage tube with bloody serous discharge with some bile.  Clinically seems improving. Preliminary cultures with some gram-positive cocci and gram-negative rods.  Blood cultures remain negative.  Urine cultures with multiple species. Surgery was also  consulted-appreciate their recommendations. Per surgery there is no need for surgical intervention and they are recommending to continue antibiotics and percutaneous drain. -Continue cefepime and Flagyl. -Continue with morphine as needed for pain. -Palliative care was consulted due to her advanced age and grave nature of her infection.  Patient will continue with outpatient palliative care along with current level of medical services. -Advance diet to full liquid-can advance further if tolerated. -Continue with supportive care. -PT/OT evaluation for SNF placement-patient is from ALF.  Hypercholesteremia --Holding all home meds    HTN (hypertension).  Blood pressure within goal. --IV prn meds    Malignant melanoma ([place]) --no acute concerns    Hearing impairment --Increased nursing care   Objective: Vitals:   02/12/20 2023 02/13/20 0300 02/13/20 0826 02/13/20 1248  BP: 137/74  136/67 140/76  Pulse: 87  80 79  Resp: 17  18 18   Temp: 98.6 F (37 C)  98.5 F (36.9 C) 98.1 F (36.7 C)  TempSrc: Oral  Oral Oral  SpO2: 94%  93% 94%  Weight:  61.9 kg    Height:        Intake/Output Summary (Last 24 hours) at 02/13/2020 1551 Last data filed at 02/13/2020 0700 Gross per 24 hour  Intake 1649.67 ml  Output 120 ml  Net 1529.67 ml   Filed Weights   02/12/20 0112 02/12/20 1817 02/13/20 0300  Weight: 65.8 kg 61.6 kg 61.9 kg    Examination:  General exam: Pleasant, hard of hearing elderly lady, appears calm and comfortable  Respiratory system: Clear to auscultation. Respiratory effort normal. Cardiovascular system: S1 & S2 heard, RRR. No JVD, murmurs, Gastrointestinal system: Soft, mild RUQ tenderness, nondistended, bowel sounds positive.  Drain in place.  Central nervous system: Alert and oriented. No focal neurological deficits. Extremities: No edema, no cyanosis, pulses intact and symmetrical. Psychiatry: Judgement and insight appear normal.   DVT prophylaxis: Lovenox Code  Status: DNR Family Communication: Talked with daughter on phone. Disposition Plan:  Status is: Inpatient  Remains inpatient appropriate because:Inpatient level of care appropriate due to severity of illness   Dispo: The patient is from: ALF              Anticipated d/c is to: SNF              Anticipated d/c date is: 1-2 days.              Patient currently is not medically stable to d/c.   Consultants:   Surgery  IR  Palliative care.  Procedures:  Antimicrobials:  Cefepime Flagyl  Data Reviewed: I have personally reviewed following labs and imaging studies  CBC: Recent Labs  Lab 02/12/20 0118 02/13/20 0745  WBC 20.2* 10.7*  NEUTROABS 18.9*  --   HGB 12.1 9.9*  HCT 36.0 29.0*  MCV 99.7 98.3  PLT 284 371   Basic Metabolic Panel: Recent Labs  Lab 02/12/20 0118 02/13/20 0745  NA 130* 134*  K 3.3* 3.8  CL 94* 101  CO2 23 25  GLUCOSE 145* 98  BUN 12 11  CREATININE 0.89 0.73  CALCIUM 8.7* 7.9*  MG 1.2* 1.7  PHOS  --  2.8   GFR: Estimated Creatinine Clearance: 36.4 mL/min (by C-G formula based on SCr of 0.73 mg/dL). Liver Function Tests: Recent Labs  Lab 02/12/20 0118 02/13/20 0745  AST 18 13*  ALT 11 9  ALKPHOS 56 45  BILITOT 1.8* 1.1  PROT 7.1 5.7*  ALBUMIN 3.5 2.5*   Recent Labs  Lab 02/12/20 0118  LIPASE 29   No results for input(s): AMMONIA in the last 168 hours. Coagulation Profile: Recent Labs  Lab 02/12/20 0118  INR 1.3*   Cardiac Enzymes: No results for input(s): CKTOTAL, CKMB, CKMBINDEX, TROPONINI in the last 168 hours. BNP (last 3 results) No results for input(s): PROBNP in the last 8760 hours. HbA1C: No results for input(s): HGBA1C in the last 72 hours. CBG: No results for input(s): GLUCAP in the last 168 hours. Lipid Profile: No results for input(s): CHOL, HDL, LDLCALC, TRIG, CHOLHDL, LDLDIRECT in the last 72 hours. Thyroid Function Tests: No results for input(s): TSH, T4TOTAL, FREET4, T3FREE, THYROIDAB in the last 72  hours. Anemia Panel: No results for input(s): VITAMINB12, FOLATE, FERRITIN, TIBC, IRON, RETICCTPCT in the last 72 hours. Sepsis Labs: Recent Labs  Lab 02/12/20 0459 02/12/20 0823 02/12/20 1222 02/13/20 0745  PROCALCITON 1.18  --   --   --   LATICACIDVEN 2.7* 2.8* 2.6* 0.9    Recent Results (from the past 240 hour(s))  Urine culture     Status: Abnormal   Collection Time: 02/12/20  1:17 AM   Specimen: In/Out Cath Urine  Result Value Ref Range Status   Specimen Description   Final    IN/OUT CATH URINE Performed at [facility], [address], [place], [place] [address]    Special Requests   Final    NONE Performed at [facility], [address] [place], [place] [address]    Culture MULTIPLE SPECIES PRESENT, SUGGEST RECOLLECTION (A)  Final   Report Status 02/12/2020 FINAL  Final  Blood culture (routine x 2)     Status: None (Preliminary result)   Collection Time: 02/12/20  1:17  AM   Specimen: BLOOD  Result Value Ref Range Status   Specimen Description BLOOD BLOOD RIGHT FOREARM  Final   Special Requests   Final    BOTTLES DRAWN AEROBIC AND ANAEROBIC Blood Culture adequate volume   Culture   Final    NO GROWTH 1 DAY Performed at [facility], [address], [place], [place] [address]    Report Status PENDING  Incomplete  Blood culture (routine x 2)     Status: None (Preliminary result)   Collection Time: 02/12/20  1:17 AM   Specimen: BLOOD  Result Value Ref Range Status   Specimen Description BLOOD LEFT ANTECUBITAL  Final   Special Requests   Final    BOTTLES DRAWN AEROBIC AND ANAEROBIC Blood Culture adequate volume   Culture   Final    NO GROWTH 1 DAY Performed at [facility], [address] [place], [place] [address]    Report Status PENDING  Incomplete  SARS Coronavirus 2 by RT PCR (hospital order, performed in [place] hospital lab) Nasopharyngeal Nasopharyngeal Swab     Status: None   Collection Time: 02/12/20   1:18 AM   Specimen: Nasopharyngeal Swab  Result Value Ref Range Status   SARS Coronavirus 2 NEGATIVE NEGATIVE Final    Comment: (NOTE) SARS-CoV-2 target nucleic acids are NOT DETECTED.  The SARS-CoV-2 RNA is generally detectable in upper and lower respiratory specimens during the acute phase of infection. The lowest concentration of SARS-CoV-2 viral copies this assay can detect is 250 copies / mL. A negative result does not preclude SARS-CoV-2 infection and should not be used as the sole basis for treatment or other patient management decisions.  A negative result may occur with improper specimen collection / handling, submission of specimen other than nasopharyngeal swab, presence of viral mutation(s) within the areas targeted by this assay, and inadequate number of viral copies (<250 copies / mL). A negative result must be combined with clinical observations, patient history, and epidemiological information.  Fact Sheet for Patients:   StrictlyIdeas.no  Fact Sheet for Healthcare Providers: BankingDealers.co.za  This test is not yet approved or  cleared by the [place] FDA and has been authorized for detection and/or diagnosis of SARS-CoV-2 by FDA under an Emergency Use Authorization (EUA).  This EUA will remain in effect (meaning this test can be used) for the duration of the COVID-19 declaration under Section 564(b)(1) of the Act, 21 U.S.C. section 360bbb-3(b)(1), unless the authorization is terminated or revoked sooner.  Performed at [facility], [place], [place] [address]   C Difficile Quick Screen w PCR reflex     Status: None   Collection Time: 02/12/20  6:24 AM   Specimen: STOOL  Result Value Ref Range Status   C Diff antigen NEGATIVE NEGATIVE Final   C Diff toxin NEGATIVE NEGATIVE Final   C Diff interpretation No C. difficile detected.  Final    Comment: Performed at [facility], [address] [place],  [address]  Gastrointestinal Panel by PCR , Stool     Status: None   Collection Time: 02/12/20  6:24 AM   Specimen: STOOL  Result Value Ref Range Status   Campylobacter species NOT DETECTED NOT DETECTED Final   Plesimonas shigelloides NOT DETECTED NOT DETECTED Final   Salmonella species NOT DETECTED NOT DETECTED Final   Yersinia enterocolitica NOT DETECTED NOT DETECTED Final   Vibrio species NOT DETECTED NOT DETECTED Final   Vibrio cholerae NOT  DETECTED NOT DETECTED Final   Enteroaggregative E coli (EAEC) NOT DETECTED NOT DETECTED Final   Enteropathogenic E coli (EPEC) NOT DETECTED NOT DETECTED Final   Enterotoxigenic E coli (ETEC) NOT DETECTED NOT DETECTED Final   Shiga like toxin producing E coli (STEC) NOT DETECTED NOT DETECTED Final   Shigella/Enteroinvasive E coli (EIEC) NOT DETECTED NOT DETECTED Final   Cryptosporidium NOT DETECTED NOT DETECTED Final   Cyclospora cayetanensis NOT DETECTED NOT DETECTED Final   Entamoeba histolytica NOT DETECTED NOT DETECTED Final   Giardia lamblia NOT DETECTED NOT DETECTED Final   Adenovirus F40/41 NOT DETECTED NOT DETECTED Final   Astrovirus NOT DETECTED NOT DETECTED Final   Norovirus GI/GII NOT DETECTED NOT DETECTED Final   Rotavirus A NOT DETECTED NOT DETECTED Final   Sapovirus (I, II, IV, and V) NOT DETECTED NOT DETECTED Final    Comment: Performed at [facility], [address] [place], [place] [address]  Aerobic/Anaerobic Culture (surgical/deep wound)     Status: None (Preliminary result)   Collection Time: 02/12/20  2:01 PM   Specimen: Gallbladder; Bile  Result Value Ref Range Status   Specimen Description   Final    GALL BLADDER Performed at [facility], [address] [place], [place] [address]    Special Requests   Final    ASPIRATE OF GALLBLADDER Performed at [facility], [address] [place], [place] [address]    Gram Stain   Final    ABUNDANT WBC  PRESENT,BOTH PMN AND MONONUCLEAR ABUNDANT GRAM POSITIVE COCCI RARE GRAM NEGATIVE RODS    Culture   Final    TOO YOUNG TO READ Performed at [facility], [place] [address], [place], [place] [address]    Report Status PENDING  Incomplete  MRSA PCR Screening     Status: None   Collection Time: 02/12/20  6:47 PM   Specimen: Nasopharyngeal  Result Value Ref Range Status   MRSA by PCR NEGATIVE NEGATIVE Final    Comment:        The GeneXpert MRSA Assay (FDA approved for NASAL specimens only), is one component of a comprehensive MRSA colonization surveillance program. It is not intended to diagnose MRSA infection nor to guide or monitor treatment for MRSA infections. Performed at [facility], [address], [place], [place] [address]      Radiology Studies: DG Chest 1 View  Result Date: 02/12/2020 CLINICAL DATA:  Tachypnea EXAM: CHEST  1 VIEW COMPARISON:  10/11/2017 FINDINGS: Cardiac shadow is stable. Aortic calcifications are again noted. The overall inspiratory effort is poor with mild left basilar atelectasis. No pneumothorax or effusion is seen. Old healed rib fractures are noted on the left with degenerative changes of the shoulder joints and thoracic spine. IMPRESSION: Mild left basilar atelectasis.  No other focal abnormality is seen. Electronically Signed   By: Inez Catalina M.D.   On: 02/12/2020 02:02   CT ABDOMEN PELVIS W CONTRAST  Result Date: 02/12/2020 CLINICAL DATA:  Acute, nonlocalized abdominal pain. Nausea and vomiting. EXAM: CT ABDOMEN AND PELVIS WITH CONTRAST TECHNIQUE: Multidetector CT imaging of the abdomen and pelvis was performed using the standard protocol following bolus administration of intravenous contrast. CONTRAST:  131mL OMNIPAQUE IOHEXOL 300 MG/ML  SOLN COMPARISON:  None. FINDINGS: Lower chest: The visualized lung bases are clear bilaterally. The visualized heart and pericardium are unremarkable. Hepatobiliary: The gallbladder is distended and  there is extensive pericholecystic inflammatory stranding identified in keeping with changes of acute cholecystitis. Moreover, there  are areas of discontinuous mural enhancement and loculated pericholecystic fluid and intramural fluid in keeping with changes of gangrenous cholecystitis. No obstructing calculus is seen within the gallbladder neck best noted on coronal image # 42/5. Several additional layering gallstones are seen within the gallbladder lumen. The liver is unremarkable. No intra or extrahepatic biliary ductal dilation. Pancreas: Unremarkable Spleen: Unremarkable Adrenals/Urinary Tract: Adrenal glands are normal. The kidneys are unremarkable. Bladder is unremarkable. Stomach/Bowel: Small hiatal hernia. Stomach, small bowel, and large bowel are unremarkable. Appendix normal. No free intraperitoneal gas or fluid. Vascular/Lymphatic: Moderate aortic atherosclerotic calcification is present without evidence of aneurysm. 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 No pathologic adenopathy within the abdomen and pelvis. Reproductive: Uterus and bilateral adnexa are unremarkable. Other: Tiny fat containing umbilical hernia.  Rectum unremarkable. Musculoskeletal: Degenerative changes are seen within the lumbar spine. No lytic or blastic bone lesions. Remote superior endplate fracture of L3 noted. IMPRESSION: Gangrenous cholecystitis with areas of mural non enhancement, perforation, and intramural fluid collection. Obstructing calculus noted at the cystic duct. Surgical consultation is advised. Peripheral vascular disease. If there is clinical evidence of chronic mesenteric ischemia or hemodynamically significant renal artery stenosis, CT arteriography may be more helpful for further evaluation. Aortic Atherosclerosis (ICD10-I70.0).  These results will be called to the ordering clinician or representative by the Radiologist Assistant, and communication documented in the PACS or [employer]. Electronically Signed   By: Fidela Salisbury MD   On: 02/12/2020 03:35   CT IMAGE GUIDED DRAINAGE BY PERCUTANEOUS CATHETER  Result Date: 02/12/2020 INDICATION: Acute calculus cholecystitis and sepsis. The patient is not a candidate for cholecystectomy currently and requires placement of a percutaneous cholecystostomy tube. EXAM: CT IMAGE GUIDED PERCUTANEOUS CHOLECYSTOSTOMY TUBE PLACEMENT MEDICATIONS: No additional medications. ANESTHESIA/SEDATION: Moderate (conscious) sedation was employed during this procedure. A total of Versed 0.5 mg and Fentanyl 25 mcg was administered intravenously. Moderate Sedation Time: 10 minutes. The patient's level of consciousness and vital signs were monitored continuously by radiology nursing throughout the procedure under my direct supervision. FLUOROSCOPY TIME:  CT guidance was utilized. COMPLICATIONS: None immediate. PROCEDURE: Informed written consent was obtained from the patient after a thorough discussion of the procedural risks, benefits and alternatives. All questions were addressed. Maximal Sterile Barrier Technique was utilized including caps, mask, sterile gowns, sterile gloves, sterile drape, hand hygiene and skin antiseptic. A timeout was performed prior to the initiation of the procedure. CT was performed in a supine position through the abdomen. After selecting a site for percutaneous access, an 18 gauge trocar needle was advanced through the right abdominal wall and into the gallbladder lumen. Of the fluid return, a guidewire was advanced into the gallbladder. The needle was removed and the tract dilated. A 10 French percutaneous drainage catheter was then advanced over the wire and formed. A bile sample was aspirated from the drain and sent for culture analysis. The drain was attached to a gravity  drainage bag. Additional CT imaging was performed to confirm drain positioning. The drainage catheter was secured at the skin with a Prolene retention suture and StatLock device. FINDINGS: There was return of grossly purulent bile from the gallbladder lumen. After percutaneous cholecystostomy tube placement, there is rapid return of purulent fluid which later became blood tinged as the gallbladder became decompressed. IMPRESSION: Placement of 10 French percutaneous  cholecystostomy tube into the gallbladder lumen. There was return of grossly purulent bile. A sample was sent for culture analysis. The cholecystostomy tube was connected to a gravity drainage bag. Electronically Signed   By: Aletta Edouard M.D.   On: 02/12/2020 14:26    Scheduled Meds: . cholecalciferol  1,000 Units Oral Daily  . enoxaparin (LOVENOX) injection  40 mg Subcutaneous Q24H  . nepafenac  1 drop Left Eye Daily  . prednisoLONE acetate  1 drop Left Eye Daily  . sodium chloride flush  5 mL Intracatheter Q8H   Continuous Infusions: . ceFEPime (MAXIPIME) IV 2 g (02/13/20 0428)  . metronidazole 500 mg (02/13/20 1235)     LOS: 1 day   Time spent: 35 minutes.  Lorella Nimrod, MD [facility]  If 7PM-7AM, please contact night-coverage [email or web address]  02/13/2020, 3:51 PM   This record has been created using Systems analyst. Errors have been sought and corrected,but may not always be located. Such creation errors do not reflect on the standard of care.

## 2020-02-14 LAB — CBC
HCT: 27.9 % — ABNORMAL LOW (ref 36.0–46.0)
Hemoglobin: 9.8 g/dL — ABNORMAL LOW (ref 12.0–15.0)
MCH: 33.9 pg (ref 26.0–34.0)
MCHC: 35.1 g/dL (ref 30.0–36.0)
MCV: 96.5 fL (ref 80.0–100.0)
Platelets: 268 10*3/uL (ref 150–400)
RBC: 2.89 MIL/uL — ABNORMAL LOW (ref 3.87–5.11)
RDW: 12.3 % (ref 11.5–15.5)
WBC: 8.6 10*3/uL (ref 4.0–10.5)
nRBC: 0 % (ref 0.0–0.2)

## 2020-02-14 LAB — BASIC METABOLIC PANEL
Anion gap: 7 (ref 5–15)
BUN: 10 mg/dL (ref 8–23)
CO2: 24 mmol/L (ref 22–32)
Calcium: 7.8 mg/dL — ABNORMAL LOW (ref 8.9–10.3)
Chloride: 103 mmol/L (ref 98–111)
Creatinine, Ser: 0.68 mg/dL (ref 0.44–1.00)
GFR calc Af Amer: >60 mL/min (ref >60)
GFR calc non Af Amer: >60 mL/min (ref >60)
Glucose, Bld: 102 mg/dL — ABNORMAL HIGH (ref 70–99)
Potassium: 3.3 mmol/L — ABNORMAL LOW (ref 3.5–5.1)
Sodium: 134 mmol/L — ABNORMAL LOW (ref 135–145)

## 2020-02-14 LAB — MAGNESIUM: Magnesium: 1.7 mg/dL (ref 1.7–2.4)

## 2020-02-14 MED ORDER — ENSURE ENLIVE PO LIQD
237.0000 mL | Freq: Two times a day (BID) | ORAL | Status: DC
  Administered 2020-02-14: 237 mL via ORAL

## 2020-02-14 MED ORDER — SODIUM CHLORIDE 0.9 % IV SOLN
INTRAVENOUS | Status: DC | PRN
  Administered 2020-02-14 – 2020-02-15 (×2): 250 mL via INTRAVENOUS

## 2020-02-14 MED ORDER — ADULT MULTIVITAMIN W/MINERALS CH
1.0000 | ORAL_TABLET | Freq: Every day | ORAL | Status: DC
  Administered 2020-02-15: 1 via ORAL
  Filled 2020-02-14: qty 1

## 2020-02-14 MED ORDER — POTASSIUM CHLORIDE CRYS ER 20 MEQ PO TBCR
40.0000 meq | EXTENDED_RELEASE_TABLET | Freq: Once | ORAL | Status: AC
  Administered 2020-02-15: 40 meq via ORAL
  Filled 2020-02-14: qty 2

## 2020-02-14 MED ORDER — SODIUM CHLORIDE 0.9 % IV SOLN
2.0000 g | INTRAVENOUS | Status: DC
  Administered 2020-02-14: 2 g via INTRAVENOUS
  Filled 2020-02-14 (×3): qty 20

## 2020-02-14 NOTE — Progress Notes (Signed)
Initial Nutrition Assessment  DOCUMENTATION CODES:   Not applicable  INTERVENTION:   Ensure Enlive po BID, each supplement provides 350 kcal and 20 grams of protein  MVI daily   Pt at moderate refeed risk; recommend monitor potassium, magnesium and phosphorus labs daily as oral intake improves.   NUTRITION DIAGNOSIS:   Inadequate oral intake related to acute illness as evidenced by meal completion < 50%.  GOAL:   Patient will meet greater than or equal to 90% of their needs  MONITOR:   PO intake, Labs, Supplement acceptance, Weight trends, Skin, I & O's  REASON FOR ASSESSMENT:   Consult Assessment of nutrition requirement/status  ASSESSMENT:   86 y.o. female  with past medical history of HTN, DM, HLD, hard of hearing and recently diagnosed melanoma who was admitted for gangrenous cholecystitis now s/p percutaneous drain placed 9/21   RD working remotely.  RD did not attempt to call pt today as pt documented to be extremely hard of hearing. Per chart review, pt eating <25% of meals in hospital. RD suspects pt with decreased appetite and oral intake for 2 weeks pta r/t nausea, vomiting and abdominal pain. RD will add supplements and MVI to help pt meet her estimated needs. Pt is likely at refeed risk. Per chart, pt appears fairly weight stable at baseline. RD will obtain nutrition related history and exam at follow-up.   Drain with 45ml output.  Medications reviewed and include: D3, lovenox, KCl, cefepime, metronidazole  Labs reviewed: Na 134(L), K 3.3(L) P 2.8 wnl, Mg 1.7 wnl Hgb 9.8(L), Hct 27.9(L)  NUTRITION - FOCUSED PHYSICAL EXAM: Unable to perform at this time   Diet Order:   Diet Order            Diet full liquid Room service appropriate? Yes; Fluid consistency: Thin  Diet effective now                EDUCATION NEEDS:   No education needs have been identified at this time  Skin:  Skin Assessment: Reviewed RN Assessment (ecchymosis)  Last BM:   9/22- type 7  Height:   Ht Readings from Last 1 Encounters:  02/12/20 5\' 2"  (1.575 m)    Weight:   Wt Readings from Last 1 Encounters:  02/13/20 61.9 kg    Ideal Body Weight:  50 kg  BMI:  Body mass index is 24.95 kg/m.  Estimated Nutritional Needs:   Kcal:  1300-1500kcal/day  Protein:  65-75g/day  Fluid:  1.3-1.5L/day  Koleen Distance MS, RD, LDN Please refer to [facility] for RD and/or RD on-call/weekend/after hours pager

## 2020-02-14 NOTE — Evaluation (Signed)
Physical Therapy Evaluation Patient Details Name: Teresa Meyer MRN: [id number] DOB: [birth year] Today's Date: 02/14/2020   History of Present Illness  Teresa Meyer is a 86 y.o. female with past medical history significant for hypertension, hearing loss, recently diagnosed melanoma, diverticulosis, esophageal stricture, GERD, hyperlipidemia, squamous cell skin cancer and arthritis.  She presented to [facility] ED from [facility]F with several day history of intermittent nausea, vomiting, low grade temp elevation, and abdominal pain that worsened over the past 24 hours. She is s/p Cholecystostomy tube placement.       Clinical Impression  Pt received in supine position upon arrival to room.  Pt unwilling to work with therapist for exercises and mobility training.  Pt does demonstrate adequate mobility of legs throughout initial evaluation, but had some weakness as noted in PT deficit list.  Pt would benefit from skilled therapy to address weakness in LEs and to increase quality of life.  Pt made remarks about being "ready to die and meet her Lord".  Consult/order  with chaplain was suggested with nurse and attending physician via secure chat.  Due to prior level of function, current recommendation for continued therapy during hospital stay and after being d/c at this time.         Follow Up Recommendations Home health PT    Equipment Recommendations  None recommended by PT    Recommendations for Other Services       Precautions / Restrictions        Mobility  Bed Mobility               General bed mobility comments: Pt unwilling to participate in bed mobility.  Transfers                    Ambulation/Gait                Stairs            Wheelchair Mobility    Modified Rankin (Stroke Patients Only)       Balance                                             Pertinent Vitals/Pain Pain Assessment: No/denies pain     Home Living Family/patient expects to be discharged to:: Assisted living               Home Equipment: Walker - 4 wheels;Cane - single point;Grab bars - tub/shower;Grab bars - toilet      Prior Function Level of Independence: Independent with assistive device(s)         Comments: Pt reports residing at [place] at [facility] with no falls.     Hand Dominance   Dominant Hand: Right    Extremity/Trunk Assessment   Upper Extremity Assessment Upper Extremity Assessment: Defer to OT evaluation    Lower Extremity Assessment Lower Extremity Assessment: Generalized weakness       Communication   Communication: No difficulties  Cognition Arousal/Alertness: Awake/alert Behavior During Therapy: WFL for tasks assessed/performed Overall Cognitive Status: Within Functional Limits for tasks assessed                                        General Comments      Exercises  Other Exercises Other Exercises: Pt educated on roles of Pt and services provided during hosptial stay. Other Exercises: Pt educated on importance of physical activity and strengthening for improved QoL and functinoal mobilty.   Assessment/Plan    PT Assessment Patient needs continued PT services  PT Problem List Decreased strength;Decreased mobility       PT Treatment Interventions DME instruction;Gait training;Therapeutic exercise;Therapeutic activities;Functional mobility training    PT Goals (Current goals can be found in the Care Plan section)  Acute Rehab PT Goals Patient Stated Goal: Pt refuses to participate in goal assessment. PT Goal Formulation: Patient unable to participate in goal setting    Frequency Min 2X/week   Barriers to discharge   Pt refusal of continuation to therapy.    Co-evaluation               AM-PAC PT "6 Clicks" Mobility  Outcome Measure                  End of Session   Activity Tolerance: Other (comment) (Pt  refused to perform out of bed activity, however demonstrates adequate of strength of BLE for bed-level exercises.) Patient left: in bed;with call bell/phone within reach;with bed alarm set Nurse Communication: Other (comment) (Nursing and attending informed of comments made about end of life by use of secure chat.) PT Visit Diagnosis: Muscle weakness (generalized) (M62.81)    Time: [id number] PT Time Calculation (min) (ACUTE ONLY): 15 min   Charges:   PT Evaluation $PT Eval Low Complexity: [place], PT, DPT 02/14/20, 11:14 AM

## 2020-02-14 NOTE — Progress Notes (Signed)
OT Cancellation Note  Patient Details Name: Teresa Meyer MRN: [id number] DOB: 09-Nov-1922   Cancelled Treatment:    Reason Eval/Treat Not Completed: Patient declined, no reason specified. OT continues to follow pt for evaluation. Upon arrival to pt room pt semi-supine in bed. After therapist introduces herself and OT, pt states "I've decided, I'm just not going to do that". Despite education, pt states she does not wish to participate in any therapy services, telling therapist "I've made up my mind and I'm not going to argue with anyone about it". RN notified, pt adamant she does not intend to participate in therapy. Will continue to follow and re-attempt once more per protocols, unless otherwise instructed by care team.   Shara Blazing, M.S., OTR/L Ascom: [phone] 02/14/20, 11:00 AM

## 2020-02-14 NOTE — Progress Notes (Addendum)
[facility] SURGICAL PROGRESS NOTE (cpt [phone])  Hospital Day(s): 2.   Interval History: Patient seen and examined, no acute events or new complaints overnight. Patient reports is very frustrated this morning by having the drain in and states "Just take this thing out and let me die." She has no other complaints. Her previous leukocytosis has now resolved and WBC is normal at 8.6K. Anemia again noted with Hgb of 9.8, which is stable and likely dilutional. Renal function is normal with sCr - 0.68. She has mild hypokalemia to 3.3 but otherwise without significant electrolyte derangements. Cholecystostomy tube with 30 ccs out recorded. Cultures from this are growing gram positive cocci and gram negative rods. Advanced to full liquid diet yesterday and tolerating well. She is refusing to work with therapy.   Review of Systems:  Constitutional: denies fever, chills  HEENT: denies cough or congestion  Respiratory: denies any shortness of breath  Cardiovascular: denies chest pain or palpitations  Gastrointestinal: denies abdominal pain, N/V, or diarrhea/and bowel function as per interval history Genitourinary: denies burning with urination or urinary frequency Musculoskeletal: denies pain, decreased motor or sensation   Vital signs in last 24 hours: [min-max] current  Temp:  [97.6 F (36.4 C)-98.9 F (37.2 C)] 97.6 F (36.4 C) (09/23 0352) Pulse Rate:  [79-85] 85 (09/23 0352) Resp:  [17-21] 21 (09/23 0352) BP: (107-169)/(59-80) 169/70 (09/23 0352) SpO2:  [93 %-95 %] 93 % (09/23 0352)     Height: 5\' 2"  (157.5 cm) Weight: 61.9 kg BMI (Calculated): 24.94   Intake/Output last 2 shifts:  09/22 0701 - 09/23 0700 In: 532.3 [IV Piggyback:527.3] Out: 280 [Urine:250; Drains:30]   Physical Exam:  Constitutional: alert, cooperative and no distress  HENT: normocephalic without obvious abnormality  Eyes: PERRL, EOM's grossly intact and symmetric  Respiratory: breathing non-labored at  rest  Cardiovascular: regular rate and sinus rhythm  Gastrointestinal: Soft, soreness over RUQ drain otherwise non-tender, and non-distended, no rebound/guarding. Cholecystostomy tube in RUQ with bilious output Musculoskeletal: no edema or wounds, motor and sensation grossly intact, NT    Labs:  CBC Latest Ref Rng & Units 02/14/2020 02/13/2020 02/12/2020  WBC 4.0 - 10.5 K/uL 8.6 10.7(H) 20.2(H)  Hemoglobin 12.0 - 15.0 g/dL 9.8(L) 9.9(L) 12.1  Hematocrit 36 - 46 % 27.9(L) 29.0(L) 36.0  Platelets 150 - 400 K/uL 268 243 284   CMP Latest Ref Rng & Units 02/14/2020 02/13/2020 02/12/2020  Glucose 70 - 99 mg/dL 102(H) 98 145(H)  BUN 8 - 23 mg/dL 10 11 12   Creatinine 0.44 - 1.00 mg/dL 0.68 0.73 0.89  Sodium 135 - 145 mmol/L 134(L) 134(L) 130(L)  Potassium 3.5 - 5.1 mmol/L 3.3(L) 3.8 3.3(L)  Chloride 98 - 111 mmol/L 103 101 94(L)  CO2 22 - 32 mmol/L 24 25 23   Calcium 8.9 - 10.3 mg/dL 7.8(L) 7.9(L) 8.7(L)  Total Protein 6.5 - 8.1 g/dL - 5.7(L) 7.1  Total Bilirubin 0.3 - 1.2 mg/dL - 1.1 1.8(H)  Alkaline Phos 38 - 126 U/L - 45 56  AST 15 - 41 U/L - 13(L) 18  ALT 0 - 44 U/L - 9 11    Imaging studies: No new pertinent imaging studies   Assessment/Plan: (ICD-10's: K81.0) 86 y.o. female with resolution in leukocytosis admitted with likely gangrenous cholecystitis s/p percutaneous cholecystostomy tube placement (28/36), complicated by pertinent comorbidities including advanced age, hard of hearing, HTN, DM   - Okay to advance diet from surgical perspective   - Maintain cholecystostomy tube; monitor and record output --> I  discussed with patient's daughter that this will need to stay in place for at least 6-8 weeks, and possibly longer as I am not sure she will ever be a great surgical candidate given the severity of her cholecystitis and higher likelihood of requiring open procedure concomitant with comorbid conditions. I will discuss her request to "remove this drain and let me die" with her daughters    - Continue IV ABx (Cefepime + Flagyl); follow up Cx results --> She will need PO for discharge once ready, recommend completely 14 days total   - Monitor abdominal pain; on-going bowel function             - Pain control prn; antiemetics prn             - No surgical intervention  - replete K+; monitor  - Mobilization if feasible; likely benefit from PT evaluation --> she is refusing             - Further management per primary service   - General surgery will sign off; she can follow up in our clinic in ~3 weeks for reassessment and should also follow up in [facility] drain clinic, ABx recommendations as above, I will leave drain instructions in the discharge instructions, please call with questions/concerns  All of the above findings and recommendations were discussed with the patient, and the medical team, and all of patient's questions were answered to her expressed satisfaction  -- Edison Simon, PA-C [facility] 02/14/2020, 7:26 AM [phone] M-F: 7am - 4pm   I saw and evaluated the patient.  I agree with the above documentation, exam, and plan, which I have edited where appropriate. Fredirick Maudlin  9:04 AM

## 2020-02-14 NOTE — Progress Notes (Signed)
Daily Progress Note   Patient Name: Teresa Meyer       Date: 02/14/2020 DOB: [birth year]  Age: 86 y.o. MRN#: [id number] Attending Physician: Lorella Nimrod, MD Primary Care Physician: [name], MD Admit Date: 02/12/2020  Reason for Consultation/Follow-up: Establishing goals of care  Subjective: Eating/drinking - denies pain/discomfort. Tells me "I'm tired, I'm ready to die" See below for full [place] conversation.   Length of Stay: 2  Current Medications: Scheduled Meds:  . cholecalciferol  1,000 Units Oral Daily  . enoxaparin (LOVENOX) injection  40 mg Subcutaneous Q24H  . feeding supplement (ENSURE ENLIVE)  237 mL Oral BID BM  . [START ON 02/15/2020] multivitamin with minerals  1 tablet Oral Daily  . nepafenac  1 drop Left Eye Daily  . potassium chloride  40 mEq Oral Once  . prednisoLONE acetate  1 drop Left Eye Daily  . sodium chloride flush  10-40 mL Intracatheter Q12H  . sodium chloride flush  5 mL Intracatheter Q8H    Continuous Infusions: . ceFEPime (MAXIPIME) IV 2 g (02/14/20 0344)  . metronidazole 500 mg (02/14/20 0441)    PRN Meds: morphine injection, ondansetron **OR** ondansetron (ZOFRAN) IV, sodium chloride flush  Physical Exam Constitutional:      General: She is not in acute distress. Pulmonary:     Effort: Pulmonary effort is normal. No respiratory distress.  Skin:    General: Skin is warm and dry.  Neurological:     Mental Status: She is alert and oriented to person, place, and time.             Vital Signs: BP (!) 161/77 (BP Location: Left Arm)   Pulse 80   Temp 97.9 F (36.6 C) (Oral)   Resp 18   Ht 5' 2"  (1.575 m)   Wt 61.9 kg   SpO2 95%   BMI 24.95 kg/m  SpO2: SpO2: 95 % O2 Device: O2 Device: Room Air O2 Flow Rate: O2 Flow Rate (L/min): 2  L/min  Intake/output summary:   Intake/Output Summary (Last 24 hours) at 02/14/2020 1325 Last data filed at 02/14/2020 1124 Gross per 24 hour  Intake 772.25 ml  Output 630 ml  Net 142.25 ml   LBM: Last BM Date: 02/13/20 Baseline Weight: Weight: 65.8 kg Most recent weight: Weight: 61.9 kg       Palliative Assessment/Data: PPS 50%   Flowsheet Rows     Most Recent Value  Intake Tab  Referral Department Hospitalist  Unit at Time of Referral Intermediate Care Unit  Palliative Care Primary Diagnosis Sepsis/Infectious Disease  Date Notified 02/12/20  Palliative Care Type New Palliative care  Reason for referral Clarify Goals of Care  Date of Admission 02/12/20  Date first seen by Palliative Care 02/13/20  # of days Palliative referral response time 1 Day(s)  # of days IP prior to Palliative referral 0  Clinical Assessment  Palliative Performance Scale Score 50%  Psychosocial & Spiritual Assessment  Palliative Care Outcomes  Patient/Family meeting held? Yes  Who was at the meeting? daughter, patient  Palliative Care Outcomes Clarified goals of care      Patient Active Problem List   Diagnosis Date Noted  . Goals of care,  counseling/discussion   . Palliative care by specialist   . Cholecystitis with gangrene of gallbladder   . Hypomagnesemia   . Hyponatremia   . Acute gangrenous cholecystitis 02/12/2020  . Sepsis ([place]) 02/12/2020  . Hearing impairment 02/12/2020  . Skin lesion 08/17/2018  . Iron deficiency anemia 11/12/2017  . Chronic venous insufficiency 11/12/2017  . Multiple rib fractures 11/02/2017  . Fall at home, initial encounter 11/02/2017  . Malignant melanoma ([place]) 04/18/2016  . Macrocytic anemia 12/26/2015  . Pedal edema 10/28/2015  . Systolic murmur 37/85/8850  . Advanced care planning/counseling discussion 05/07/2014  . History of malignant melanoma   . Medicare annual wellness visit, subsequent 09/24/2013  . DNR (do not resuscitate)   .  Hypercholesteremia 11/19/2011  . HTN (hypertension) 11/19/2011  . Osteoporosis, post-menopausal 11/19/2011  . GERD 07/15/2009  . ESOPHAGEAL STRICTURE 12/19/2008  . ARTHRITIS 11/26/2008  . HYPERCHOLESTEROLEMIA 11/20/2008  . Essential hypertension 11/20/2008  . DIVERTICULOSIS, COLON 11/20/2008  . Osteopenia 11/20/2008  . Dysphagia 11/20/2008    Palliative Care Assessment & Plan   HPI: 86 y.o. female  with past medical history of HTN, DM, and recently diagnosed melanoma admitted on 02/12/2020 with nausea and vomiting x2 weeks. CT abdomen concerning for gangrenous cholecystitis. Percutaneous drain placed 9/21. Now tolerating diet advancement. PMT consulted to discuss [place].   Assessment: Patient informed PT, OT, surgery, and RN that she did not want to continue with medications/therapies - wanted to die.   Spoke to patient independently. She tells me the same. Tells me "I'm 89, I'm weak, I'm ready to go to heaven". She is adamant that she does not want to participate in therapy or continue medications. I ask her want she wants and she tells me "I want to die". She tells me she cannot go home (independent living at [facility]) as she is too weak but she does not want to go anywhere else. She tells me she wants to die in the hospital. We discuss that she is not actively dying and will be discharged soon - she is unlikely to die in the hospital and we need a plan for where to go. We talk about hospice as an option. We also talk about rehab facility. We planned to meet again with her daughters to include them in the conversation.  Met with daughters - discussed patient's medical condition - discussed she is stable with continuation of drain and medications. Discussed patient's above stated wishes. Daughters do not agree and spend time encouraging patient to consider continuing medical therapies. They speak about the things she loves to do and her family she loves to spend time with. After much discussion  between patient's and daughters - she does agree that she will go to SNF (only if they will put her on second floor and not third). We discuss the need for her to participate in therapies and continue medications. Discuss need to maintain drain. Daughters ask for more time to continue to speak with patient about wishes/encourage her - they would like to meet with WOC RN and PT/OT follow up. Discussed with patient option of "time limited trial" - attempt therapies for a time and if it is intolerable we can reconsider comfort measures.   WIll plan on d/c to SNF with palliative to follow up outpatient.   Recommendations/Plan:  Plan for d/c to SNF with palliative to follow up  Discussed above plan with [facility] team, PT/OT  WOC consult for creative solutions for drain?  PMT will follow  Code Status:  DNR  Prognosis:   Unable to determine  Discharge Planning:  [place] for rehab with Palliative care service follow-up  Care plan was discussed with patient, daughters, Dr. Reesa Chew, PT/OT, [facility] team, surgery  Thank you for allowing the Palliative Medicine Team to assist in the care of this patient.   Total Time 75 minutes Prolonged Time Billed  yes       Greater than 50%  of this time was spent counseling and coordinating care related to the above assessment and plan.  Juel Burrow, DNP, [facility] Palliative Medicine Team Team Phone # [phone]  Pager [phone]

## 2020-02-14 NOTE — Progress Notes (Signed)
Pharmacy Antibiotic Note  Teresa Meyer is a 86 y.o. female admitted on 02/12/2020 with intra-abdominal infection, likely gangrenous cholecystitis s/p percutaneous cholecystostomy tube placement (9/21). PMH significant for HTN and recently diagnosed melanoma. Upon arrival pt c/o N/V x2 weeks with abdominal pain, diarrhea, and poor appetite. Patient was started on Cefepime and Flagyl. Pharmacy has been consulted for Cefepime dosing.  Day 3 IV abx, WBC 20.2>10.7>8.6, Scr <1, afebrile  9/21 CT abdomen: gangrenous cholecystitis; culture from gall bladder growing GPC and GNR 9/21 BCx: 1/4 bottles strep spp; BCID + for strep spp; Cdiff and GI panel were negative  Plan: Continue Cefepime IV 2g Q12h Monitor renal function and adjust as clinically indicated  Surgery plans to continue IV abx (cefepime + flagyl) and will need PO for discharge for total DOT 14 days  Height: 5\' 2"  (157.5 cm) Weight: 61.9 kg (136 lb 6.4 oz) IBW/kg (Calculated) : 50.1  Temp (24hrs), Avg:98.2 F (36.8 C), Min:97.6 F (36.4 C), Max:98.9 F (37.2 C)  Recent Labs  Lab 02/12/20 0118 02/12/20 0459 02/12/20 0823 02/12/20 1222 02/13/20 0745 02/14/20 0614  WBC 20.2*  --   --   --  10.7* 8.6  CREATININE 0.89  --   --   --  0.73 0.68  LATICACIDVEN 2.2* 2.7* 2.8* 2.6* 0.9  --     Estimated Creatinine Clearance: 36.4 mL/min (by C-G formula based on SCr of 0.68 mg/dL).    Allergies  Allergen Reactions  . Bisphosphonates Other (See Comments)    Dysphagia to oral bisphosphonates  . Codeine   . Statins Other (See Comments)    Muscle aches  . Sulfa Antibiotics     Can't recall reaction  . Procaine Hcl Palpitations    Antimicrobials this admission: 9/21 Cefepime >>  9/21 Metronidazole >>    Microbiology results: 9/21 BCx: 1/4 bottles strep spp 9/21 UCx: multiple species, suggest recollection  9/21 Cdiff PCR: negative 9/21 GI panel: negative 9/22 BCID strep spp 9/21 Gall bladder: GPC, GNR  Thank you for  allowing pharmacy to be a part of this patient's care.  Sherilyn Banker, PharmD Pharmacy Resident  02/14/2020 1:27 PM

## 2020-02-14 NOTE — Progress Notes (Signed)
PROGRESS NOTE    Teresa Meyer  GYI:[id number] DOB: February 09, 1923 DOA: 02/12/2020 PCP: [name], MD   Brief Narrative:  Teresa Meyer a 86 y.o.femalewith medical history significant forHTN, hearing loss, recently diagnosed melanoma, who presented from the nursing home with concerns for nausea vomiting, intermittent for the past 2 weeks. Patient resides in assisted living at [facility] and over the course of the past 2 weeks received IV hydration on the nursing home side of the facility where she improved and was transferred back to ALF 3 days prior but symptoms returned and becameacutely worse in the past 24 hours, with associateddiarrhea, poor appetite, shaking chills and severe crampy upper abdominal painand protracted weakness. She denies cough or shortness of breath or dysuria. On arrival she was febrile at temperature of 100, tachycardic and mildly tachypneic with leukocytosis and lactic acidosis.  CT abdomen concerning for gangrenous cholecystitis.  General surgery was consulted from ED and they recommend percutaneous drainage placement by IR which was placed. Patient tolerated the procedure well.  Cultures were sent.  Pending final results.  Subjective: Patient has no new complaint today.  She is very hard of hearing.  There was some nursing concern that patient stating that she wants to die and remove this tube and would like to withdraw our treatment.  Palliative care was involved.  After discussing with family and family agrees to continue with current level of care with hope to go to a rehab.  Assessment & Plan:   Principal Problem:   Sepsis ([place]) Active Problems:   Hypercholesteremia   HTN (hypertension)   Malignant melanoma ([place])   Acute gangrenous cholecystitis   Hearing impairment   Goals of care, counseling/discussion   Palliative care by specialist   Cholecystitis with gangrene of gallbladder   Hypomagnesemia   Hyponatremia  Sepsis secondary to acute  gangrenous cholecystitis.  Drainage tube with bloody serous discharge with some bile.  Clinically seems improving.  Blood cultures remain negative.  Urine cultures with multiple species. Bile cultures with Streptococcus Anginosis-pending susceptibility results. Surgery was also consulted-appreciate their recommendations. Per surgery there is no need for surgical intervention and they are recommending to continue antibiotics and percutaneous drain. -Switch cefepime with ceftriaxone. -Continue Flagyl. -Continue with morphine as needed for pain. -Palliative care was consulted due to her advanced age and grave nature of her infection.  Patient will continue with outpatient palliative care along with current level of medical services. -Advance diet to soft. -Continue with supportive care. -PT/OT evaluation for SNF placement-patient is from ALF.  Hypercholesteremia --Holding all home meds    HTN (hypertension).  Blood pressure within goal. --IV prn meds    Malignant melanoma ([place]) --no acute concerns    Hearing impairment --Increased nursing care   Objective: Vitals:   02/14/20 0352 02/14/20 0759 02/14/20 1123 02/14/20 1521  BP: (!) 169/70 (!) 159/67 (!) 161/77 (!) 154/71  Pulse: 85 78 80 77  Resp: (!) 21 17 18 17   Temp: 97.6 F (36.4 C) 98.1 F (36.7 C) 97.9 F (36.6 C) 97.8 F (36.6 C)  TempSrc: Oral Oral Oral Oral  SpO2: 93% 96% 95% 94%  Weight:      Height:        Intake/Output Summary (Last 24 hours) at 02/14/2020 1859 Last data filed at 02/14/2020 1840 Gross per 24 hour  Intake 1125 ml  Output 940 ml  Net 185 ml   Filed Weights   02/12/20 0112 02/12/20 1817 02/13/20 0300  Weight: 65.8 kg  61.6 kg 61.9 kg    Examination:  General.  Very hard of hearing elderly lady, in no acute distress. Pulmonary.  Lungs clear bilaterally, normal respiratory effort. CV.  Regular rate and rhythm, no JVD, rub or murmur. Abdomen.  Soft, nontender, nondistended, BS  positive. CNS.  Alert and oriented x3.  No focal neurologic deficit. Extremities.  No edema, no cyanosis, pulses intact and symmetrical. Psychiatry.  Judgment and insight appears normal.  DVT prophylaxis: Lovenox Code Status: DNR Family Communication: Talked with daughter on phone. Disposition Plan:  Status is: Inpatient  Remains inpatient appropriate because:Inpatient level of care appropriate due to severity of illness   Dispo: The patient is from: ALF              Anticipated d/c is to: SNF              Anticipated d/c date is: 1-2 days.              Patient currently is not medically stable to d/c.   Consultants:   Surgery  IR  Palliative care.  Procedures:  Antimicrobials:  Ceftriaxone Flagyl  Data Reviewed: I have personally reviewed following labs and imaging studies  CBC: Recent Labs  Lab 02/12/20 0118 02/13/20 0745 02/14/20 0614  WBC 20.2* 10.7* 8.6  NEUTROABS 18.9*  --   --   HGB 12.1 9.9* 9.8*  HCT 36.0 29.0* 27.9*  MCV 99.7 98.3 96.5  PLT 284 243 381   Basic Metabolic Panel: Recent Labs  Lab 02/12/20 0118 02/13/20 0745 02/14/20 0614  NA 130* 134* 134*  K 3.3* 3.8 3.3*  CL 94* 101 103  CO2 23 25 24   GLUCOSE 145* 98 102*  BUN 12 11 10   CREATININE 0.89 0.73 0.68  CALCIUM 8.7* 7.9* 7.8*  MG 1.2* 1.7 1.7  PHOS  --  2.8  --    GFR: Estimated Creatinine Clearance: 36.4 mL/min (by C-G formula based on SCr of 0.68 mg/dL). Liver Function Tests: Recent Labs  Lab 02/12/20 0118 02/13/20 0745  AST 18 13*  ALT 11 9  ALKPHOS 56 45  BILITOT 1.8* 1.1  PROT 7.1 5.7*  ALBUMIN 3.5 2.5*   Recent Labs  Lab 02/12/20 0118  LIPASE 29   No results for input(s): AMMONIA in the last 168 hours. Coagulation Profile: Recent Labs  Lab 02/12/20 0118  INR 1.3*   Cardiac Enzymes: No results for input(s): CKTOTAL, CKMB, CKMBINDEX, TROPONINI in the last 168 hours. BNP (last 3 results) No results for input(s): PROBNP in the last 8760  hours. HbA1C: No results for input(s): HGBA1C in the last 72 hours. CBG: No results for input(s): GLUCAP in the last 168 hours. Lipid Profile: No results for input(s): CHOL, HDL, LDLCALC, TRIG, CHOLHDL, LDLDIRECT in the last 72 hours. Thyroid Function Tests: No results for input(s): TSH, T4TOTAL, FREET4, T3FREE, THYROIDAB in the last 72 hours. Anemia Panel: No results for input(s): VITAMINB12, FOLATE, FERRITIN, TIBC, IRON, RETICCTPCT in the last 72 hours. Sepsis Labs: Recent Labs  Lab 02/12/20 0459 02/12/20 0823 02/12/20 1222 02/13/20 0745  PROCALCITON 1.18  --   --   --   LATICACIDVEN 2.7* 2.8* 2.6* 0.9    Recent Results (from the past 240 hour(s))  Urine culture     Status: Abnormal   Collection Time: 02/12/20  1:17 AM   Specimen: In/Out Cath Urine  Result Value Ref Range Status   Specimen Description   Final    IN/OUT CATH URINE Performed at [facility], [address] [place], [place] [address]    Special Requests   Final    NONE Performed at [facility], [address] [place], [place] [address]    Culture MULTIPLE SPECIES PRESENT, SUGGEST RECOLLECTION (A)  Final   Report Status 02/12/2020 FINAL  Final  Blood culture (routine x 2)     Status: None (Preliminary result)   Collection Time: 02/12/20  1:17 AM   Specimen: BLOOD  Result Value Ref Range Status   Specimen Description BLOOD BLOOD RIGHT FOREARM  Final   Special Requests   Final    BOTTLES DRAWN AEROBIC AND ANAEROBIC Blood Culture adequate volume   Culture  Setup Time   Final    Organism ID to follow ANAEROBIC BOTTLE ONLY GRAM POSITIVE COCCI CRITICAL RESULT CALLED TO, READ BACK BY AND VERIFIED WITH: Winfield Rast PATEL 02/13/20 AT 2123 BY ACR Performed at [facility], [address] [place], [place] [address]    Culture GRAM POSITIVE COCCI  Final   Report Status PENDING  Incomplete  Blood culture (routine x 2)     Status: None (Preliminary result)   Collection Time: 02/12/20   1:17 AM   Specimen: BLOOD  Result Value Ref Range Status   Specimen Description BLOOD LEFT ANTECUBITAL  Final   Special Requests   Final    BOTTLES DRAWN AEROBIC AND ANAEROBIC Blood Culture adequate volume   Culture   Final    NO GROWTH 2 DAYS Performed at [facility], [address] [place], [place] [address]    Report Status PENDING  Incomplete  Blood Culture ID Panel (Reflexed)     Status: Abnormal   Collection Time: 02/12/20  1:17 AM  Result Value Ref Range Status   Enterococcus faecalis NOT DETECTED NOT DETECTED Final   Enterococcus Faecium NOT DETECTED NOT DETECTED Final   Listeria monocytogenes NOT DETECTED NOT DETECTED Final   Staphylococcus species NOT DETECTED NOT DETECTED Final   Staphylococcus aureus (BCID) NOT DETECTED NOT DETECTED Final   Staphylococcus epidermidis NOT DETECTED NOT DETECTED Final   Staphylococcus lugdunensis NOT DETECTED NOT DETECTED Final   Streptococcus species DETECTED (A) NOT DETECTED Final    Comment: Not Enterococcus species, Streptococcus agalactiae, Streptococcus pyogenes, or Streptococcus pneumoniae. CRITICAL RESULT CALLED TO, READ BACK BY AND VERIFIED WITH: Winfield Rast PATEL 02/13/20 AT 2123 BY ACR    Streptococcus agalactiae NOT DETECTED NOT DETECTED Final   Streptococcus pneumoniae NOT DETECTED NOT DETECTED Final   Streptococcus pyogenes NOT DETECTED NOT DETECTED Final   A.calcoaceticus-baumannii NOT DETECTED NOT DETECTED Final   Bacteroides fragilis NOT DETECTED NOT DETECTED Final   Enterobacterales NOT DETECTED NOT DETECTED Final   Enterobacter cloacae complex NOT DETECTED NOT DETECTED Final   Escherichia coli NOT DETECTED NOT DETECTED Final   Klebsiella aerogenes NOT DETECTED NOT DETECTED Final   Klebsiella oxytoca NOT DETECTED NOT DETECTED Final   Klebsiella pneumoniae NOT DETECTED NOT DETECTED Final   Proteus species NOT DETECTED NOT DETECTED Final   Salmonella species NOT DETECTED NOT DETECTED Final   Serratia marcescens  NOT DETECTED NOT DETECTED Final   Haemophilus influenzae NOT DETECTED NOT DETECTED Final   Neisseria meningitidis NOT DETECTED NOT DETECTED Final   Pseudomonas aeruginosa NOT DETECTED NOT DETECTED Final   Stenotrophomonas maltophilia NOT DETECTED NOT DETECTED Final   Candida albicans NOT DETECTED NOT DETECTED Final   Candida auris NOT DETECTED NOT DETECTED Final   Candida glabrata NOT DETECTED NOT DETECTED Final   Candida krusei NOT DETECTED  NOT DETECTED Final   Candida parapsilosis NOT DETECTED NOT DETECTED Final   Candida tropicalis NOT DETECTED NOT DETECTED Final   Cryptococcus neoformans/gattii NOT DETECTED NOT DETECTED Final    Comment: Performed at [facility], [address] [place], [place] [address]  SARS Coronavirus 2 by RT PCR (hospital order, performed in [facility] hospital lab) Nasopharyngeal Nasopharyngeal Swab     Status: None   Collection Time: 02/12/20  1:18 AM   Specimen: Nasopharyngeal Swab  Result Value Ref Range Status   SARS Coronavirus 2 NEGATIVE NEGATIVE Final    Comment: (NOTE) SARS-CoV-2 target nucleic acids are NOT DETECTED.  The SARS-CoV-2 RNA is generally detectable in upper and lower respiratory specimens during the acute phase of infection. The lowest concentration of SARS-CoV-2 viral copies this assay can detect is 250 copies / mL. A negative result does not preclude SARS-CoV-2 infection and should not be used as the sole basis for treatment or other patient management decisions.  A negative result may occur with improper specimen collection / handling, submission of specimen other than nasopharyngeal swab, presence of viral mutation(s) within the areas targeted by this assay, and inadequate number of viral copies (<250 copies / mL). A negative result must be combined with clinical observations, patient history, and epidemiological information.  Fact Sheet for Patients:   StrictlyIdeas.no  Fact Sheet for  Healthcare Providers: BankingDealers.co.za  This test is not yet approved or  cleared by the [place] FDA and has been authorized for detection and/or diagnosis of SARS-CoV-2 by FDA under an Emergency Use Authorization (EUA).  This EUA will remain in effect (meaning this test can be used) for the duration of the COVID-19 declaration under Section 564(b)(1) of the Act, 21 U.S.C. section 360bbb-3(b)(1), unless the authorization is terminated or revoked sooner.  Performed at [facility], [place], [place] [address]   C Difficile Quick Screen w PCR reflex     Status: None   Collection Time: 02/12/20  6:24 AM   Specimen: STOOL  Result Value Ref Range Status   C Diff antigen NEGATIVE NEGATIVE Final   C Diff toxin NEGATIVE NEGATIVE Final   C Diff interpretation No C. difficile detected.  Final    Comment: Performed at [facility], [address] [place], [place] [id number]  Gastrointestinal Panel by PCR , Stool     Status: None   Collection Time: 02/12/20  6:24 AM   Specimen: STOOL  Result Value Ref Range Status   Campylobacter species NOT DETECTED NOT DETECTED Final   Plesimonas shigelloides NOT DETECTED NOT DETECTED Final   Salmonella species NOT DETECTED NOT DETECTED Final   Yersinia enterocolitica NOT DETECTED NOT DETECTED Final   Vibrio species NOT DETECTED NOT DETECTED Final   Vibrio cholerae NOT DETECTED NOT DETECTED Final   Enteroaggregative E coli (EAEC) NOT DETECTED NOT DETECTED Final   Enteropathogenic E coli (EPEC) NOT DETECTED NOT DETECTED Final   Enterotoxigenic E coli (ETEC) NOT DETECTED NOT DETECTED Final   Shiga like toxin producing E coli (STEC) NOT DETECTED NOT DETECTED Final   Shigella/Enteroinvasive E coli (EIEC) NOT DETECTED NOT DETECTED Final   Cryptosporidium NOT DETECTED NOT DETECTED Final   Cyclospora cayetanensis NOT DETECTED NOT DETECTED Final   Entamoeba histolytica NOT DETECTED NOT DETECTED  Final   Giardia lamblia NOT DETECTED NOT DETECTED Final   Adenovirus F40/41 NOT DETECTED NOT DETECTED Final   Astrovirus NOT DETECTED NOT DETECTED Final   Norovirus GI/GII NOT DETECTED  NOT DETECTED Final   Rotavirus A NOT DETECTED NOT DETECTED Final   Sapovirus (I, II, IV, and V) NOT DETECTED NOT DETECTED Final    Comment: Performed at [facility], [address], [place], [place] [address]  Aerobic/Anaerobic Culture (surgical/deep wound)     Status: None (Preliminary result)   Collection Time: 02/12/20  2:01 PM   Specimen: Gallbladder; Bile  Result Value Ref Range Status   Specimen Description   Final    GALL BLADDER Performed at [facility], [address] [place], [place] [address]    Special Requests   Final    ASPIRATE OF GALLBLADDER Performed at [facility], [address] [place], [place] [address]    Gram Stain   Final    ABUNDANT WBC PRESENT,BOTH PMN AND MONONUCLEAR ABUNDANT GRAM POSITIVE COCCI RARE GRAM NEGATIVE RODS    Culture   Final    MODERATE STREPTOCOCCUS ANGINOSIS SUSCEPTIBILITIES TO FOLLOW Performed at [facility], [place] [address], [place], [place] [address]    Report Status PENDING  Incomplete  MRSA PCR Screening     Status: None   Collection Time: 02/12/20  6:47 PM   Specimen: Nasopharyngeal  Result Value Ref Range Status   MRSA by PCR NEGATIVE NEGATIVE Final    Comment:        The GeneXpert MRSA Assay (FDA approved for NASAL specimens only), is one component of a comprehensive MRSA colonization surveillance program. It is not intended to diagnose MRSA infection nor to guide or monitor treatment for MRSA infections. Performed at [facility], [address] [place], [place] [address]      Radiology Studies: No results found.  Scheduled Meds: . cholecalciferol  1,000 Units Oral Daily  . enoxaparin (LOVENOX) injection  40 mg Subcutaneous Q24H  . feeding supplement (ENSURE ENLIVE)  237 mL Oral  BID BM  . [START ON 02/15/2020] multivitamin with minerals  1 tablet Oral Daily  . nepafenac  1 drop Left Eye Daily  . potassium chloride  40 mEq Oral Once  . prednisoLONE acetate  1 drop Left Eye Daily  . sodium chloride flush  10-40 mL Intracatheter Q12H  . sodium chloride flush  5 mL Intracatheter Q8H   Continuous Infusions: . cefTRIAXone (ROCEPHIN)  IV 2 g (02/14/20 1831)  . metronidazole 500 mg (02/14/20 1421)     LOS: 2 days   Time spent: 30 minutes.  Lorella Nimrod, MD [facility]  If 7PM-7AM, please contact night-coverage [email or web address]  02/14/2020, 6:59 PM   This record has been created using Dragon voice recognition software. Errors have been sought and corrected,but may not always be located. Such creation errors do not reflect on the standard of care.

## 2020-02-15 DIAGNOSIS — E876 Hypokalemia: Secondary | ICD-10-CM

## 2020-02-15 MED ORDER — AMOXICILLIN-POT CLAVULANATE 875-125 MG PO TABS: 1.0000 | ORAL_TABLET | Freq: Two times a day (BID) | ORAL | Status: DC

## 2020-02-15 MED ORDER — AMOXICILLIN-POT CLAVULANATE 875-125 MG PO TABS: 1.0000 | ORAL_TABLET | Freq: Two times a day (BID) | ORAL | 0 refills | Status: AC

## 2020-02-15 MED ORDER — LOSARTAN POTASSIUM 25 MG PO TABS
25.0000 mg | ORAL_TABLET | Freq: Every day | ORAL | Status: DC
  Administered 2020-02-15: 25 mg via ORAL
  Filled 2020-02-15: qty 1

## 2020-02-15 MED ORDER — ONDANSETRON HCL 4 MG PO TABS: 4.0000 mg | ORAL_TABLET | Freq: Four times a day (QID) | ORAL | 0 refills | Status: DC | PRN

## 2020-02-15 MED ORDER — LOSARTAN POTASSIUM 25 MG PO TABS: 25.0000 mg | ORAL_TABLET | Freq: Every day | ORAL | Status: DC

## 2020-02-15 MED ORDER — ADULT MULTIVITAMIN W/MINERALS CH: 1.0000 | ORAL_TABLET | Freq: Every day | ORAL | Status: DC

## 2020-02-15 MED ORDER — CIPROFLOXACIN HCL 500 MG PO TABS: 500.0000 mg | ORAL_TABLET | Freq: Two times a day (BID) | ORAL | 0 refills | Status: AC

## 2020-02-15 MED ORDER — ENSURE ENLIVE PO LIQD: 237.0000 mL | Freq: Two times a day (BID) | ORAL | 12 refills | Status: DC

## 2020-02-15 MED ORDER — CIPROFLOXACIN HCL 500 MG PO TABS
500.0000 mg | ORAL_TABLET | Freq: Two times a day (BID) | ORAL | Status: DC
  Filled 2020-02-15: qty 1

## 2020-02-15 NOTE — Evaluation (Signed)
Occupational Therapy Evaluation Patient Details Name: Teresa Meyer MRN: [id number] DOB: April 26, 1923 Today's Date: 02/15/2020    History of Present Illness Teresa Meyer is a 86 y.o. female with past medical history significant for hypertension, hearing loss, recently diagnosed melanoma, diverticulosis, esophageal stricture, GERD, hyperlipidemia, squamous cell skin cancer and arthritis.  She presented to [facility] ED from [facility] ALF with several day history of intermittent nausea, vomiting, low grade temp elevation, and abdominal pain that worsened over the past 24 hours. She is s/p Cholecystostomy tube placement.   Clinical Impression   Teresa Meyer was seen for OT evaluation this date. Prior to hospital admission, pt was Independent in ADLs/mobility. Pt lives at [facility] ALF. Pt presents to acute OT demonstrating impaired ADL performance and functional mobility 2/2 decreased activity tolerance, functional strength/ROM deficits, and self-limiting participation. Pt refused OOB mobility stating "I am tied down right now, I'm not getting up with that tube in me." Pt currently requires SETUP self-feeding at bed level. MOD A for LB access long sitting in bed. SUPERVISION for rolling at bedlevel, MIN A + B rails scooting higher in bed. Pt would benefit from skilled OT to address noted impairments and functional limitations (see below for any additional details) in order to maximize safety and independence while minimizing falls risk and caregiver burden. Upon hospital discharge, recommend STR to maximize pt safety and return to PLOF.     Follow Up Recommendations  SNF    Equipment Recommendations       Recommendations for Other Services       Precautions / Restrictions Precautions Precautions: Fall Restrictions Weight Bearing Restrictions: No      Mobility Bed Mobility Overal bed mobility: Needs Assistance Bed Mobility: Rolling Rolling: Supervision   Transfers    General  transfer comment: Pt refused stating "I am tied down right now, I'm not getting up with that tube in me"        ADL either performed or assessed with clinical judgement   ADL Overall ADL's : Needs assistance/impaired      General ADL Comments: SETUP self-feeding at bed level. MOD A for LB access long sitting in bed.                  Pertinent Vitals/Pain Pain Assessment: No/denies pain     Hand Dominance Right   Extremity/Trunk Assessment Upper Extremity Assessment Upper Extremity Assessment: Generalized weakness   Lower Extremity Assessment Lower Extremity Assessment: Generalized weakness       Communication Communication Communication: No difficulties   Cognition Arousal/Alertness: Awake/alert Behavior During Therapy: WFL for tasks assessed/performed Overall Cognitive Status: Within Functional Limits for tasks assessed                  General Comments       Exercises Exercises: Other exercises Other Exercises Other Exercises: Pt educated re: OT role, falls prevention, importance of OOB mobility for functional strengthening Other Exercises: Rolling, self-feeding, bed mobility   Shoulder Instructions      Home Living Family/patient expects to be discharged to:: Assisted living        Home Equipment: Walker - 4 wheels;Cane - single point;Grab bars - tub/shower;Grab bars - toilet   Additional Comments: [facility]      Prior Functioning/Environment Level of Independence: Independent with assistive device(s)        Comments: Pt reports residing at [place] at [facility] with no falls.  OT Problem List: Decreased strength;Decreased activity tolerance;Decreased safety awareness;Decreased knowledge of use of DME or AE      OT Treatment/Interventions: Self-care/ADL training;Therapeutic exercise;Energy conservation;DME and/or AE instruction;Therapeutic activities;Patient/family education;Balance training    OT Goals(Current  goals can be found in the care plan section) Acute Rehab OT Goals Patient Stated Goal: To return home OT Goal Formulation: With patient Time For Goal Achievement: 02/29/20 Potential to Achieve Goals: Good ADL Goals Pt Will Perform Grooming: with supervision;sitting Pt Will Perform Lower Body Dressing: with modified independence;sitting/lateral leans Pt Will Transfer to Toilet: with min assist;stand pivot transfer;bedside commode (c LRAD PRN)  OT Frequency: Min 1X/week   Barriers to D/C: Decreased caregiver support             AM-PAC OT "6 Clicks" Daily Activity     Outcome Measure Help from another person eating meals?: None Help from another person taking care of personal grooming?: A Little Help from another person toileting, which includes using toliet, bedpan, or urinal?: A Lot Help from another person bathing (including washing, rinsing, drying)?: A Lot Help from another person to put on and taking off regular upper body clothing?: A Little Help from another person to put on and taking off regular lower body clothing?: A Lot 6 Click Score: 16   End of Session Nurse Communication: Mobility status  Activity Tolerance: Patient limited by fatigue (Pt self-limiting) Patient left: in bed;with call bell/phone within reach;with bed alarm set  OT Visit Diagnosis: Other abnormalities of gait and mobility (R26.89);Muscle weakness (generalized) (M62.81)                Time: 0109-3235 OT Time Calculation (min): 13 min Charges:  OT General Charges $OT Visit: 1 Visit OT Evaluation $OT Eval Low Complexity: 1 Low OT Treatments $Self Care/Home Management : 8-22 mins  Dessie Coma, M.S. OTR/L  02/15/20, 1:26 PM  ascom [phone]

## 2020-02-15 NOTE — Discharge Summary (Signed)
Physician Discharge Summary  Teresa Meyer QQV:[id number] DOB: 01-28-1923 DOA: 02/12/2020  PCP: [name], MD  Admit date: 02/12/2020 Discharge date: 02/15/2020  Admitted From: ALF Disposition: SNF   Recommendations for Outpatient Follow-up:  1. Follow up with PCP in 1-2 weeks 2. Follow-up with surgery in 2 weeks. 3. Being discharged with drain and will need drain care. 4. Please obtain BMP/CBC in one week 5. Please follow up on the following pending results:None  Home Health: No Equipment/Devices: Percutaneous drain, rolling walker Discharge Condition: Stable CODE STATUS: DNR Diet recommendation: Heart Healthy / Carb Modified   Brief/Interim Summary: Teresa Brisbin Delanyis a 86 y.o.femalewith medical history significant forHTN, hearing loss, recently diagnosed melanoma, who presented from the nursing home with concerns for nausea vomiting, intermittent for the past 2 weeks. Patient resides in assisted living at [facility] and over the course of the past 2 weeks received IV hydration on the nursing home side of the facility where she improved and was transferred back to ALF 3 days prior but symptoms returned and becameacutely worse in the past 24 hours, with associateddiarrhea, poor appetite, shaking chills and severe crampy upper abdominal painand protracted weakness. She denies cough or shortness of breath or dysuria. On arrival she was febrile at temperature of 100, tachycardic and mildly tachypneic with leukocytosis and lactic acidosis. CT abdomen concerning for gangrenous cholecystitis.  Patient admitted with sepsis secondary to gangrenous cholecystitis and was started on broad-spectrum antibiotics.  General surgery was consulted from ED and they recommend percutaneous drainage placement by IR which was placed. Patient tolerated the procedure well.  Cultures grew Streptococcus anginosus and Enterobacter closure.  She was initially treated with broad-spectrum antibiotics which  include cefepime and Flagyl, later switched to ceftriaxone and Flagyl.  Responded well.  Leukocytosis and temperature subsided.  Patient was feeling overall better.  There was initially some concern by patient that she wants to be comfort care only and wants her drain to be taken out.  Palliative care was consulted and after talking with family and patient she agrees to complete the antibiotic course and will be discharged to skilled nursing facility with outpatient palliative care.  She received antibiotics for 4 days in the hospital and discharged home on Augmentin and ciprofloxacin according to sensitivity results for 10 more days. Per surgeon's recommendation she will need antibiotics for total of 2 weeks and drain should remain in place for 2 months.  She will follow-up with surgery as an outpatient.  She also need drain care.  Copied out the notes from our wound care. Suggestions made for securement of the tube and ways to cover the tube for comfort and discretion.  1. Tube stabilization devices are available: [employer] makes ones and the family or facility can request free samples at the website below.   https://[email or web address]/en/products/critical-care-products/tube-securement/tube-attachment-devices/drain-tube-attachment-device-_dtad_  2. Use mesh or stretch disposable underwear to cover site after using ABD pad to cover, secure tube to the patient's skin without tension.  3. Utilize silicone foam dressings cut like split gauze to stabilize tube and to absorb drainage and protect peritubular skin  Her home dose of statin was held and discontinued on discharge due to some transaminitis.  She needs an evaluation by her primary care physician and have CMP checked before resuming.  Patient found to have persistently elevated blood pressure.  She was not on any antihypertensives at home.  She was started on low-dose losartan which can be titrated by her primary care provider as needed.  She  can  continue rest of her home meds and follow-up with her providers.   Discharge Diagnoses:  Principal Problem:   Sepsis ([place]) Active Problems:   Hypercholesteremia   HTN (hypertension)   Malignant melanoma ([place])   Acute gangrenous cholecystitis   Hearing impairment   Goals of care, counseling/discussion   Palliative care by specialist   Cholecystitis with gangrene of gallbladder   Hypomagnesemia   Hyponatremia   Hypokalemia   Discharge Instructions  Discharge Instructions    Diet - low sodium heart healthy   Complete by: As directed    Discharge wound care:   Complete by: As directed    Suggestions made for securement of the tube and ways to cover the tube for comfort and discretion.  1. Tube stabilization devices are available: [employer] makes ones and the family or facility can request free samples at the website below.   https://www.hollister.com/en/products/critical-care-products/tube-securement/tube-attachment-devices/drain-tube-attachment-device-_dtad_  2. Use mesh or stretch disposable underwear to cover site after using ABD pad to cover, secure tube to the patient's skin without tension.  3. Utilize silicone foam dressings cut like split gauze to stabilize tube and to absorb drainage and protect peritubular skin   Increase activity slowly   Complete by: As directed      Allergies as of 02/15/2020      Reactions   Bisphosphonates Other (See Comments)   Dysphagia to oral bisphosphonates   Codeine    Statins Other (See Comments)   Muscle aches   Sulfa Antibiotics    Can't recall reaction   Procaine Hcl Palpitations      Medication List    TAKE these medications   amoxicillin-clavulanate 875-125 MG tablet Commonly known as: AUGMENTIN Take 1 tablet by mouth every 12 (twelve) hours for 10 days.   calcium carbonate 500 MG chewable tablet Commonly known as: TUMS - dosed in mg elemental calcium Chew 1 tablet by mouth 2 (two) times daily with a meal.    cholecalciferol 1000 units tablet Commonly known as: VITAMIN D Take 1,000 Units by mouth daily.   ciprofloxacin 500 MG tablet Commonly known as: CIPRO Take 1 tablet (500 mg total) by mouth 2 (two) times daily for 10 days.   diphenhydramine-acetaminophen 25-500 MG Tabs tablet Commonly known as: TYLENOL PM Take 2 tablets by mouth at bedtime as needed (pain and sleep).   feeding supplement (ENSURE ENLIVE) Liqd Take 237 mLs by mouth 2 (two) times daily between meals.   Ilevro 0.3 % ophthalmic suspension Generic drug: nepafenac Place 1 drop into the left eye daily.   losartan 25 MG tablet Commonly known as: COZAAR Take 1 tablet (25 mg total) by mouth daily. Start taking on: February 16, 2020   multivitamin with minerals Tabs tablet Take 1 tablet by mouth daily. Start taking on: February 16, 2020   ondansetron 4 MG tablet Commonly known as: ZOFRAN Take 1 tablet (4 mg total) by mouth every 6 (six) hours as needed for nausea.   prednisoLONE acetate 0.12 % ophthalmic suspension Commonly known as: PRED MILD Place 1 drop into the left eye daily.            Discharge Care Instructions  (From admission, onward)         Start     Ordered   02/15/20 0000  Discharge wound care:       Comments: Suggestions made for securement of the tube and ways to cover the tube for comfort and discretion.  1. Tube stabilization devices are available: [employer] makes ones and  the family or facility can request free samples at the website below.   https://www.hollister.com/en/products/critical-care-products/tube-securement/tube-attachment-devices/drain-tube-attachment-device-_dtad_  2. Use mesh or stretch disposable underwear to cover site after using ABD pad to cover, secure tube to the patient's skin without tension.  3. Utilize silicone foam dressings cut like split gauze to stabilize tube and to absorb drainage and protect peritubular skin   02/15/20 1253          Follow-up  Information    Fredirick Maudlin, MD. Schedule an appointment as soon as possible for a visit in 3 week(s).   Specialty: General Surgery Why: Cholecystitis, has percutaneous cholecystostomy tube Contact information: [place] [address] [phone]        [name], MD. Schedule an appointment as soon as possible for a visit.   Specialty: Family Medicine Contact information: [place] [address] [phone]              Allergies  Allergen Reactions  . Bisphosphonates Other (See Comments)    Dysphagia to oral bisphosphonates  . Codeine   . Statins Other (See Comments)    Muscle aches  . Sulfa Antibiotics     Can't recall reaction  . Procaine Hcl Palpitations    Consultations:  General surgery  IR  Procedures/Studies: DG Chest 1 View  Result Date: 02/12/2020 CLINICAL DATA:  Tachypnea EXAM: CHEST  1 VIEW COMPARISON:  10/11/2017 FINDINGS: Cardiac shadow is stable. Aortic calcifications are again noted. The overall inspiratory effort is poor with mild left basilar atelectasis. No pneumothorax or effusion is seen. Old healed rib fractures are noted on the left with degenerative changes of the shoulder joints and thoracic spine. IMPRESSION: Mild left basilar atelectasis.  No other focal abnormality is seen. Electronically Signed   By: Inez Catalina M.D.   On: 02/12/2020 02:02   CT ABDOMEN PELVIS W CONTRAST  Result Date: 02/12/2020 CLINICAL DATA:  Acute, nonlocalized abdominal pain. Nausea and vomiting. EXAM: CT ABDOMEN AND PELVIS WITH CONTRAST TECHNIQUE: Multidetector CT imaging of the abdomen and pelvis was performed using the standard protocol following bolus administration of intravenous contrast. CONTRAST:  121mL OMNIPAQUE IOHEXOL 300 MG/ML  SOLN COMPARISON:  None. FINDINGS: Lower chest: The visualized lung bases are clear bilaterally. The visualized heart and pericardium are unremarkable. Hepatobiliary: The  gallbladder is distended and there is extensive pericholecystic inflammatory stranding identified in keeping with changes of acute cholecystitis. Moreover, there are areas of discontinuous mural enhancement and loculated pericholecystic fluid and intramural fluid in keeping with changes of gangrenous cholecystitis. No obstructing calculus is seen within the gallbladder neck best noted on coronal image # 42/5. Several additional layering gallstones are seen within the gallbladder lumen. The liver is unremarkable. No intra or extrahepatic biliary ductal dilation. Pancreas: Unremarkable Spleen: Unremarkable Adrenals/Urinary Tract: Adrenal glands are normal. The kidneys are unremarkable. Bladder is unremarkable. Stomach/Bowel: Small hiatal hernia. Stomach, small bowel, and large bowel are unremarkable. Appendix normal. No free intraperitoneal gas or fluid. Vascular/Lymphatic: Moderate aortic atherosclerotic calcification is present without evidence of aneurysm. 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 No pathologic adenopathy within the abdomen and pelvis. Reproductive: Uterus and bilateral adnexa are unremarkable. Other: Tiny fat containing umbilical hernia.  Rectum unremarkable. Musculoskeletal: Degenerative changes are seen within the lumbar spine. No lytic or blastic  bone lesions. Remote superior endplate fracture of L3 noted. IMPRESSION: Gangrenous cholecystitis with areas of mural non enhancement, perforation, and intramural fluid collection. Obstructing calculus noted at the cystic duct. Surgical consultation is advised. Peripheral vascular disease. If there is clinical evidence of chronic mesenteric ischemia or hemodynamically significant renal artery stenosis, CT arteriography may be more helpful for further evaluation. Aortic  Atherosclerosis (ICD10-I70.0). These results will be called to the ordering clinician or representative by the Radiologist Assistant, and communication documented in the PACS or [employer]. Electronically Signed   By: Fidela Salisbury MD   On: 02/12/2020 03:35   CT IMAGE GUIDED DRAINAGE BY PERCUTANEOUS CATHETER  Result Date: 02/12/2020 INDICATION: Acute calculus cholecystitis and sepsis. The patient is not a candidate for cholecystectomy currently and requires placement of a percutaneous cholecystostomy tube. EXAM: CT IMAGE GUIDED PERCUTANEOUS CHOLECYSTOSTOMY TUBE PLACEMENT MEDICATIONS: No additional medications. ANESTHESIA/SEDATION: Moderate (conscious) sedation was employed during this procedure. A total of Versed 0.5 mg and Fentanyl 25 mcg was administered intravenously. Moderate Sedation Time: 10 minutes. The patient's level of consciousness and vital signs were monitored continuously by radiology nursing throughout the procedure under my direct supervision. FLUOROSCOPY TIME:  CT guidance was utilized. COMPLICATIONS: None immediate. PROCEDURE: Informed written consent was obtained from the patient after a thorough discussion of the procedural risks, benefits and alternatives. All questions were addressed. Maximal Sterile Barrier Technique was utilized including caps, mask, sterile gowns, sterile gloves, sterile drape, hand hygiene and skin antiseptic. A timeout was performed prior to the initiation of the procedure. CT was performed in a supine position through the abdomen. After selecting a site for percutaneous access, an 18 gauge trocar needle was advanced through the right abdominal wall and into the gallbladder lumen. Of the fluid return, a guidewire was advanced into the gallbladder. The needle was removed and the tract dilated. A 10 French percutaneous drainage catheter was then advanced over the wire and formed. A bile sample was aspirated from the drain and sent for culture analysis. The drain  was attached to a gravity drainage bag. Additional CT imaging was performed to confirm drain positioning. The drainage catheter was secured at the skin with a Prolene retention suture and StatLock device. FINDINGS: There was return of grossly purulent bile from the gallbladder lumen. After percutaneous cholecystostomy tube placement, there is rapid return of purulent fluid which later became blood tinged as the gallbladder became decompressed. IMPRESSION: Placement of 10 French percutaneous cholecystostomy tube into the gallbladder lumen. There was return of grossly purulent bile. A sample was sent for culture analysis. The cholecystostomy tube was connected to a gravity drainage bag. Electronically Signed   By: Aletta Edouard M.D.   On: 02/12/2020 14:26    Subjective: Patient was feeling better when seen today.  She is very hard of hearing.  She was requesting to go back home.  Discharge Exam: Vitals:   02/15/20 0859 02/15/20 1119  BP: (!) 189/92 (!) 157/71  Pulse: 75 73  Resp: 14 17  Temp: 98.3 F (36.8 C) 98.2 F (36.8 C)  SpO2: 96% 95%   Vitals:   02/14/20 2006 02/15/20 0501 02/15/20 0859 02/15/20 1119  BP: (!) 157/69 (!) 188/79 (!) 189/92 (!) 157/71  Pulse: 75 84 75 73  Resp:  17 14 17   Temp: 99 F (37.2 C) 98.6 F (37 C) 98.3 F (36.8 C) 98.2 F (36.8 C)  TempSrc: Oral Oral Oral Oral  SpO2: 94% 96% 96% 95%  Weight:  61.6 kg  Height:        General: Pt is alert, awake, not in acute distress Cardiovascular: RRR, S1/S2 +, no rubs, no gallops Respiratory: CTA bilaterally, no wheezing, no rhonchi Abdominal: Soft, NT, ND, bowel sounds + right sided percutaneous drain in place with some serous discharge. Extremities: no edema, no cyanosis   The results of significant diagnostics from this hospitalization (including imaging, microbiology, ancillary and laboratory) are listed below for reference.    Microbiology: Recent Results (from the past 240 hour(s))  Urine culture      Status: Abnormal   Collection Time: 02/12/20  1:17 AM   Specimen: In/Out Cath Urine  Result Value Ref Range Status   Specimen Description   Final    IN/OUT CATH URINE Performed at [facility], [address] [place], [place] [address]    Special Requests   Final    NONE Performed at [facility], [address] [place], [place] [address]    Culture MULTIPLE SPECIES PRESENT, SUGGEST RECOLLECTION (A)  Final   Report Status 02/12/2020 FINAL  Final  Blood culture (routine x 2)     Status: Abnormal (Preliminary result)   Collection Time: 02/12/20  1:17 AM   Specimen: BLOOD  Result Value Ref Range Status   Specimen Description   Final    BLOOD BLOOD RIGHT FOREARM Performed at [facility], [address], [place], [place] [address]    Special Requests   Final    BOTTLES DRAWN AEROBIC AND ANAEROBIC Blood Culture adequate volume Performed at [facility], [address] [place], [place] [address]    Culture  Setup Time   Final    Organism ID to follow ANAEROBIC BOTTLE ONLY GRAM POSITIVE COCCI CRITICAL RESULT CALLED TO, READ BACK BY AND VERIFIED WITH: Winfield Rast PATEL 02/13/20 AT 2123 BY ACR Performed at [facility], [address] [place], [place] [address]    Culture (A)  Final    STREPTOCOCCUS ANGINOSIS THE SIGNIFICANCE OF ISOLATING THIS ORGANISM FROM A SINGLE SET OF BLOOD CULTURES WHEN MULTIPLE SETS ARE DRAWN IS UNCERTAIN. PLEASE NOTIFY THE MICROBIOLOGY DEPARTMENT WITHIN ONE WEEK IF SPECIATION AND SENSITIVITIES ARE REQUIRED. Performed at [facility], [place] [address], [place],  [address]    Report Status PENDING  Incomplete  Blood culture (routine x 2)     Status: None (Preliminary result)   Collection Time: 02/12/20  1:17 AM   Specimen: BLOOD  Result Value Ref Range Status   Specimen Description BLOOD LEFT ANTECUBITAL  Final   Special Requests   Final    BOTTLES DRAWN AEROBIC AND ANAEROBIC Blood Culture adequate  volume   Culture   Final    NO GROWTH 3 DAYS Performed at [facility], [address] [place],  [address]    Report Status PENDING  Incomplete  Blood Culture ID Panel (Reflexed)     Status: Abnormal   Collection Time: 02/12/20  1:17 AM  Result Value Ref Range Status   Enterococcus faecalis NOT DETECTED NOT DETECTED Final   Enterococcus Faecium NOT DETECTED NOT DETECTED Final   Listeria monocytogenes NOT DETECTED NOT DETECTED Final   Staphylococcus species NOT DETECTED NOT DETECTED Final   Staphylococcus aureus (BCID) NOT DETECTED NOT DETECTED Final   Staphylococcus epidermidis NOT DETECTED NOT DETECTED Final   Staphylococcus lugdunensis NOT DETECTED NOT DETECTED Final   Streptococcus species DETECTED (A) NOT DETECTED Final    Comment: Not Enterococcus species, Streptococcus agalactiae, Streptococcus pyogenes, or Streptococcus  pneumoniae. CRITICAL RESULT CALLED TO, READ BACK BY AND VERIFIED WITH: Winfield Rast PATEL 02/13/20 AT 2123 BY ACR    Streptococcus agalactiae NOT DETECTED NOT DETECTED Final   Streptococcus pneumoniae NOT DETECTED NOT DETECTED Final   Streptococcus pyogenes NOT DETECTED NOT DETECTED Final   A.calcoaceticus-baumannii NOT DETECTED NOT DETECTED Final   Bacteroides fragilis NOT DETECTED NOT DETECTED Final   Enterobacterales NOT DETECTED NOT DETECTED Final   Enterobacter cloacae complex NOT DETECTED NOT DETECTED Final   Escherichia coli NOT DETECTED NOT DETECTED Final   Klebsiella aerogenes NOT DETECTED NOT DETECTED Final   Klebsiella oxytoca NOT DETECTED NOT DETECTED Final   Klebsiella pneumoniae NOT DETECTED NOT DETECTED Final   Proteus species NOT DETECTED NOT DETECTED Final   Salmonella species NOT DETECTED NOT DETECTED Final   Serratia marcescens NOT DETECTED NOT DETECTED Final   Haemophilus influenzae NOT DETECTED NOT DETECTED Final   Neisseria meningitidis NOT DETECTED NOT DETECTED Final   Pseudomonas aeruginosa NOT DETECTED NOT DETECTED Final    Stenotrophomonas maltophilia NOT DETECTED NOT DETECTED Final   Candida albicans NOT DETECTED NOT DETECTED Final   Candida auris NOT DETECTED NOT DETECTED Final   Candida glabrata NOT DETECTED NOT DETECTED Final   Candida krusei NOT DETECTED NOT DETECTED Final   Candida parapsilosis NOT DETECTED NOT DETECTED Final   Candida tropicalis NOT DETECTED NOT DETECTED Final   Cryptococcus neoformans/gattii NOT DETECTED NOT DETECTED Final    Comment: Performed at [facility], [address] [place], [place] [address]  SARS Coronavirus 2 by RT PCR (hospital order, performed in [place] hospital lab) Nasopharyngeal Nasopharyngeal Swab     Status: None   Collection Time: 02/12/20  1:18 AM   Specimen: Nasopharyngeal Swab  Result Value Ref Range Status   SARS Coronavirus 2 NEGATIVE NEGATIVE Final    Comment: (NOTE) SARS-CoV-2 target nucleic acids are NOT DETECTED.  The SARS-CoV-2 RNA is generally detectable in upper and lower respiratory specimens during the acute phase of infection. The lowest concentration of SARS-CoV-2 viral copies this assay can detect is 250 copies / mL. A negative result does not preclude SARS-CoV-2 infection and should not be used as the sole basis for treatment or other patient management decisions.  A negative result may occur with improper specimen collection / handling, submission of specimen other than nasopharyngeal swab, presence of viral mutation(s) within the areas targeted by this assay, and inadequate number of viral copies (<250 copies / mL). A negative result must be combined with clinical observations, patient history, and epidemiological information.  Fact Sheet for Patients:   StrictlyIdeas.no  Fact Sheet for Healthcare Providers: BankingDealers.co.za  This test is not yet approved or  cleared by the [place] FDA and has been authorized for detection and/or diagnosis of SARS-CoV-2 by FDA  under an Emergency Use Authorization (EUA).  This EUA will remain in effect (meaning this test can be used) for the duration of the COVID-19 declaration under Section 564(b)(1) of the Act, 21 U.S.C. section 360bbb-3(b)(1), unless the authorization is terminated or revoked sooner.  Performed at [facility], [place], [place] [address]   C Difficile Quick Screen w PCR reflex     Status: None   Collection Time: 02/12/20  6:24 AM   Specimen: STOOL  Result Value Ref Range Status   C Diff antigen NEGATIVE NEGATIVE Final   C Diff toxin NEGATIVE NEGATIVE Final   C Diff interpretation No C. difficile detected.  Final    Comment:  Performed at [facility], [address] [place], [place] [address]  Gastrointestinal Panel by PCR , Stool     Status: None   Collection Time: 02/12/20  6:24 AM   Specimen: STOOL  Result Value Ref Range Status   Campylobacter species NOT DETECTED NOT DETECTED Final   Plesimonas shigelloides NOT DETECTED NOT DETECTED Final   Salmonella species NOT DETECTED NOT DETECTED Final   Yersinia enterocolitica NOT DETECTED NOT DETECTED Final   Vibrio species NOT DETECTED NOT DETECTED Final   Vibrio cholerae NOT DETECTED NOT DETECTED Final   Enteroaggregative E coli (EAEC) NOT DETECTED NOT DETECTED Final   Enteropathogenic E coli (EPEC) NOT DETECTED NOT DETECTED Final   Enterotoxigenic E coli (ETEC) NOT DETECTED NOT DETECTED Final   Shiga like toxin producing E coli (STEC) NOT DETECTED NOT DETECTED Final   Shigella/Enteroinvasive E coli (EIEC) NOT DETECTED NOT DETECTED Final   Cryptosporidium NOT DETECTED NOT DETECTED Final   Cyclospora cayetanensis NOT DETECTED NOT DETECTED Final   Entamoeba histolytica NOT DETECTED NOT DETECTED Final   Giardia lamblia NOT DETECTED NOT DETECTED Final   Adenovirus F40/41 NOT DETECTED NOT DETECTED Final   Astrovirus NOT DETECTED NOT DETECTED Final   Norovirus GI/GII NOT DETECTED NOT DETECTED Final    Rotavirus A NOT DETECTED NOT DETECTED Final   Sapovirus (I, II, IV, and V) NOT DETECTED NOT DETECTED Final    Comment: Performed at [facility], [address] [place], [place] [address]  Aerobic/Anaerobic Culture (surgical/deep wound)     Status: None (Preliminary result)   Collection Time: 02/12/20  2:01 PM   Specimen: Gallbladder; Bile  Result Value Ref Range Status   Specimen Description   Final    GALL BLADDER Performed at [facility], [address] [place], [place] [address]    Special Requests   Final    ASPIRATE OF GALLBLADDER Performed at [facility], [address] [place], [place] [address]    Gram Stain   Final    ABUNDANT WBC PRESENT,BOTH PMN AND MONONUCLEAR ABUNDANT GRAM POSITIVE COCCI RARE GRAM NEGATIVE RODS Performed at [facility], [address], [place], [place] [address]    Culture   Final    MODERATE STREPTOCOCCUS ANGINOSIS FEW ENTEROBACTER CLOACAE NO ANAEROBES ISOLATED; CULTURE IN PROGRESS FOR 5 DAYS    Report Status PENDING  Incomplete   Organism ID, Bacteria STREPTOCOCCUS ANGINOSIS  Final   Organism ID, Bacteria ENTEROBACTER CLOACAE  Final      Susceptibility   Enterobacter cloacae - MIC*    CEFAZOLIN >=64 RESISTANT Resistant     CEFEPIME <=0.12 SENSITIVE Sensitive     CEFTAZIDIME <=1 SENSITIVE Sensitive     CIPROFLOXACIN <=0.25 SENSITIVE Sensitive     GENTAMICIN <=1 SENSITIVE Sensitive     IMIPENEM <=0.25 SENSITIVE Sensitive     TRIMETH/SULFA <=20 SENSITIVE Sensitive     PIP/TAZO <=4 SENSITIVE Sensitive     * FEW ENTEROBACTER CLOACAE   Streptococcus anginosis - MIC*    PENICILLIN <=0.06 SENSITIVE Sensitive     CEFTRIAXONE 0.25 SENSITIVE Sensitive     ERYTHROMYCIN <=0.12 SENSITIVE Sensitive     LEVOFLOXACIN 0.5 SENSITIVE Sensitive     VANCOMYCIN <=0.12 SENSITIVE Sensitive     * MODERATE STREPTOCOCCUS ANGINOSIS  MRSA PCR Screening     Status: None   Collection Time: 02/12/20  6:47 PM   Specimen:  Nasopharyngeal  Result Value Ref Range Status   MRSA by PCR NEGATIVE NEGATIVE Final  Comment:        The GeneXpert MRSA Assay (FDA approved for NASAL specimens only), is one component of a comprehensive MRSA colonization surveillance program. It is not intended to diagnose MRSA infection nor to guide or monitor treatment for MRSA infections. Performed at [facility], [address] [place], [place] [address]      Labs: BNP (last 3 results) No results for input(s): BNP in the last 8760 hours. Basic Metabolic Panel: Recent Labs  Lab 02/12/20 0118 02/13/20 0745 02/14/20 0614  NA 130* 134* 134*  K 3.3* 3.8 3.3*  CL 94* 101 103  CO2 23 25 24   GLUCOSE 145* 98 102*  BUN 12 11 10   CREATININE 0.89 0.73 0.68  CALCIUM 8.7* 7.9* 7.8*  MG 1.2* 1.7 1.7  PHOS  --  2.8  --    Liver Function Tests: Recent Labs  Lab 02/12/20 0118 02/13/20 0745  AST 18 13*  ALT 11 9  ALKPHOS 56 45  BILITOT 1.8* 1.1  PROT 7.1 5.7*  ALBUMIN 3.5 2.5*   Recent Labs  Lab 02/12/20 0118  LIPASE 29   No results for input(s): AMMONIA in the last 168 hours. CBC: Recent Labs  Lab 02/12/20 0118 02/13/20 0745 02/14/20 0614  WBC 20.2* 10.7* 8.6  NEUTROABS 18.9*  --   --   HGB 12.1 9.9* 9.8*  HCT 36.0 29.0* 27.9*  MCV 99.7 98.3 96.5  PLT 284 243 268   Cardiac Enzymes: No results for input(s): CKTOTAL, CKMB, CKMBINDEX, TROPONINI in the last 168 hours. BNP: Invalid input(s): POCBNP CBG: No results for input(s): GLUCAP in the last 168 hours. D-Dimer No results for input(s): DDIMER in the last 72 hours. Hgb A1c No results for input(s): HGBA1C in the last 72 hours. Lipid Profile No results for input(s): CHOL, HDL, LDLCALC, TRIG, CHOLHDL, LDLDIRECT in the last 72 hours. Thyroid function studies No results for input(s): TSH, T4TOTAL, T3FREE, THYROIDAB in the last 72 hours.  Invalid input(s): FREET3 Anemia work up No results for input(s): VITAMINB12, FOLATE, FERRITIN, TIBC,  IRON, RETICCTPCT in the last 72 hours. Urinalysis    Component Value Date/Time   COLORURINE AMBER (A) 02/12/2020 0118   APPEARANCEUR CLOUDY (A) 02/12/2020 0118   LABSPEC 1.024 02/12/2020 0118   PHURINE 5.0 02/12/2020 0118   GLUCOSEU NEGATIVE 02/12/2020 0118   HGBUR MODERATE (A) 02/12/2020 0118   BILIRUBINUR NEGATIVE 02/12/2020 0118   KETONESUR 20 (A) 02/12/2020 0118   PROTEINUR 100 (A) 02/12/2020 0118   NITRITE NEGATIVE 02/12/2020 0118   LEUKOCYTESUR NEGATIVE 02/12/2020 0118   Sepsis Labs Invalid input(s): PROCALCITONIN,  WBC,  LACTICIDVEN Microbiology Recent Results (from the past 240 hour(s))  Urine culture     Status: Abnormal   Collection Time: 02/12/20  1:17 AM   Specimen: In/Out Cath Urine  Result Value Ref Range Status   Specimen Description   Final    IN/OUT CATH URINE Performed at [facility], [address], [place], [place] [address]    Special Requests   Final    NONE Performed at [facility], [address] [place], [place] [address]    Culture MULTIPLE SPECIES PRESENT, SUGGEST RECOLLECTION (A)  Final   Report Status 02/12/2020 FINAL  Final  Blood culture (routine x 2)     Status: Abnormal (Preliminary result)   Collection Time: 02/12/20  1:17 AM   Specimen: BLOOD  Result Value Ref Range Status   Specimen Description   Final    BLOOD BLOOD RIGHT  FOREARM Performed at [facility], [address], [place], [place] [address]    Special Requests   Final    BOTTLES DRAWN AEROBIC AND ANAEROBIC Blood Culture adequate volume Performed at [facility], [address] [place], [place] [address]    Culture  Setup Time   Final    Organism ID to follow ANAEROBIC BOTTLE ONLY GRAM POSITIVE COCCI CRITICAL RESULT CALLED TO, READ BACK BY AND VERIFIED WITH: [place] Rast PATEL 02/13/20 AT 2123 BY ACR Performed at [facility], [address] [place], [place] [address]    Culture (A)  Final    STREPTOCOCCUS ANGINOSIS THE  SIGNIFICANCE OF ISOLATING THIS ORGANISM FROM A SINGLE SET OF BLOOD CULTURES WHEN MULTIPLE SETS ARE DRAWN IS UNCERTAIN. PLEASE NOTIFY THE MICROBIOLOGY DEPARTMENT WITHIN ONE WEEK IF SPECIATION AND SENSITIVITIES ARE REQUIRED. Performed at [facility], [address] [address], [place], [place] [address]    Report Status PENDING  Incomplete  Blood culture (routine x 2)     Status: None (Preliminary result)   Collection Time: 02/12/20  1:17 AM   Specimen: BLOOD  Result Value Ref Range Status   Specimen Description BLOOD LEFT ANTECUBITAL  Final   Special Requests   Final    BOTTLES DRAWN AEROBIC AND ANAEROBIC Blood Culture adequate volume   Culture   Final    NO GROWTH 3 DAYS Performed at [facility], [address] [place], [place] [address]    Report Status PENDING  Incomplete  Blood Culture ID Panel (Reflexed)     Status: Abnormal   Collection Time: 02/12/20  1:17 AM  Result Value Ref Range Status   Enterococcus faecalis NOT DETECTED NOT DETECTED Final   Enterococcus Faecium NOT DETECTED NOT DETECTED Final   Listeria monocytogenes NOT DETECTED NOT DETECTED Final   Staphylococcus species NOT DETECTED NOT DETECTED Final   Staphylococcus aureus (BCID) NOT DETECTED NOT DETECTED Final   Staphylococcus epidermidis NOT DETECTED NOT DETECTED Final   Staphylococcus lugdunensis NOT DETECTED NOT DETECTED Final   Streptococcus species DETECTED (A) NOT DETECTED Final    Comment: Not Enterococcus species, Streptococcus agalactiae, Streptococcus pyogenes, or Streptococcus pneumoniae. CRITICAL RESULT CALLED TO, READ BACK BY AND VERIFIED WITH: [place] Rast PATEL 02/13/20 AT 2123 BY ACR    Streptococcus agalactiae NOT DETECTED NOT DETECTED Final   Streptococcus pneumoniae NOT DETECTED NOT DETECTED Final   Streptococcus pyogenes NOT DETECTED NOT DETECTED Final   A.calcoaceticus-baumannii NOT DETECTED NOT DETECTED Final   Bacteroides fragilis NOT DETECTED NOT DETECTED Final   Enterobacterales NOT  DETECTED NOT DETECTED Final   Enterobacter cloacae complex NOT DETECTED NOT DETECTED Final   Escherichia coli NOT DETECTED NOT DETECTED Final   Klebsiella aerogenes NOT DETECTED NOT DETECTED Final   Klebsiella oxytoca NOT DETECTED NOT DETECTED Final   Klebsiella pneumoniae NOT DETECTED NOT DETECTED Final   Proteus species NOT DETECTED NOT DETECTED Final   Salmonella species NOT DETECTED NOT DETECTED Final   Serratia marcescens NOT DETECTED NOT DETECTED Final   Haemophilus influenzae NOT DETECTED NOT DETECTED Final   Neisseria meningitidis NOT DETECTED NOT DETECTED Final   Pseudomonas aeruginosa NOT DETECTED NOT DETECTED Final   Stenotrophomonas maltophilia NOT DETECTED NOT DETECTED Final   Candida albicans NOT DETECTED NOT DETECTED Final   Candida auris NOT DETECTED NOT DETECTED Final   Candida glabrata NOT DETECTED NOT DETECTED Final   Candida krusei NOT DETECTED NOT DETECTED Final   Candida parapsilosis NOT DETECTED NOT DETECTED Final   Candida tropicalis  NOT DETECTED NOT DETECTED Final   Cryptococcus neoformans/gattii NOT DETECTED NOT DETECTED Final    Comment: Performed at [facility], [address] [place], [place] [address]  SARS Coronavirus 2 by RT PCR (hospital order, performed in [facility] hospital lab) Nasopharyngeal Nasopharyngeal Swab     Status: None   Collection Time: 02/12/20  1:18 AM   Specimen: Nasopharyngeal Swab  Result Value Ref Range Status   SARS Coronavirus 2 NEGATIVE NEGATIVE Final    Comment: (NOTE) SARS-CoV-2 target nucleic acids are NOT DETECTED.  The SARS-CoV-2 RNA is generally detectable in upper and lower respiratory specimens during the acute phase of infection. The lowest concentration of SARS-CoV-2 viral copies this assay can detect is 250 copies / mL. A negative result does not preclude SARS-CoV-2 infection and should not be used as the sole basis for treatment or other patient management decisions.  A negative result may occur  with improper specimen collection / handling, submission of specimen other than nasopharyngeal swab, presence of viral mutation(s) within the areas targeted by this assay, and inadequate number of viral copies (<250 copies / mL). A negative result must be combined with clinical observations, patient history, and epidemiological information.  Fact Sheet for Patients:   StrictlyIdeas.no  Fact Sheet for Healthcare Providers: BankingDealers.co.za  This test is not yet approved or  cleared by the [place] FDA and has been authorized for detection and/or diagnosis of SARS-CoV-2 by FDA under an Emergency Use Authorization (EUA).  This EUA will remain in effect (meaning this test can be used) for the duration of the COVID-19 declaration under Section 564(b)(1) of the Act, 21 U.S.C. section 360bbb-3(b)(1), unless the authorization is terminated or revoked sooner.  Performed at [facility], [place], [place] [address]   C Difficile Quick Screen w PCR reflex     Status: None   Collection Time: 02/12/20  6:24 AM   Specimen: STOOL  Result Value Ref Range Status   C Diff antigen NEGATIVE NEGATIVE Final   C Diff toxin NEGATIVE NEGATIVE Final   C Diff interpretation No C. difficile detected.  Final    Comment: Performed at [facility], [address] [place], [place] [address]  Gastrointestinal Panel by PCR , Stool     Status: None   Collection Time: 02/12/20  6:24 AM   Specimen: STOOL  Result Value Ref Range Status   Campylobacter species NOT DETECTED NOT DETECTED Final   Plesimonas shigelloides NOT DETECTED NOT DETECTED Final   Salmonella species NOT DETECTED NOT DETECTED Final   Yersinia enterocolitica NOT DETECTED NOT DETECTED Final   Vibrio species NOT DETECTED NOT DETECTED Final   Vibrio cholerae NOT DETECTED NOT DETECTED Final   Enteroaggregative E coli (EAEC) NOT DETECTED NOT DETECTED Final    Enteropathogenic E coli (EPEC) NOT DETECTED NOT DETECTED Final   Enterotoxigenic E coli (ETEC) NOT DETECTED NOT DETECTED Final   Shiga like toxin producing E coli (STEC) NOT DETECTED NOT DETECTED Final   Shigella/Enteroinvasive E coli (EIEC) NOT DETECTED NOT DETECTED Final   Cryptosporidium NOT DETECTED NOT DETECTED Final   Cyclospora cayetanensis NOT DETECTED NOT DETECTED Final   Entamoeba histolytica NOT DETECTED NOT DETECTED Final   Giardia lamblia NOT DETECTED NOT DETECTED Final   Adenovirus F40/41 NOT DETECTED NOT DETECTED Final   Astrovirus NOT DETECTED NOT DETECTED Final   Norovirus GI/GII NOT DETECTED NOT DETECTED Final   Rotavirus A NOT DETECTED NOT DETECTED Final   Sapovirus (I,  II, IV, and V) NOT DETECTED NOT DETECTED Final    Comment: Performed at [facility], [address] [place], [place] [address]  Aerobic/Anaerobic Culture (surgical/deep wound)     Status: None (Preliminary result)   Collection Time: 02/12/20  2:01 PM   Specimen: Gallbladder; Bile  Result Value Ref Range Status   Specimen Description   Final    GALL BLADDER Performed at [facility], [address] [place], [place] [address]    Special Requests   Final    ASPIRATE OF GALLBLADDER Performed at [facility], [address] [place], [place] [address]    Gram Stain   Final    ABUNDANT WBC PRESENT,BOTH PMN AND MONONUCLEAR ABUNDANT GRAM POSITIVE COCCI RARE GRAM NEGATIVE RODS Performed at [facility], [place] [address], [place], [place] [address]    Culture   Final    MODERATE STREPTOCOCCUS ANGINOSIS FEW ENTEROBACTER CLOACAE NO ANAEROBES ISOLATED; CULTURE IN PROGRESS FOR 5 DAYS    Report Status PENDING  Incomplete   Organism ID, Bacteria STREPTOCOCCUS ANGINOSIS  Final   Organism ID, Bacteria ENTEROBACTER CLOACAE  Final      Susceptibility   Enterobacter cloacae - MIC*    CEFAZOLIN >=64 RESISTANT Resistant     CEFEPIME <=0.12 SENSITIVE Sensitive     CEFTAZIDIME  <=1 SENSITIVE Sensitive     CIPROFLOXACIN <=0.25 SENSITIVE Sensitive     GENTAMICIN <=1 SENSITIVE Sensitive     IMIPENEM <=0.25 SENSITIVE Sensitive     TRIMETH/SULFA <=20 SENSITIVE Sensitive     PIP/TAZO <=4 SENSITIVE Sensitive     * FEW ENTEROBACTER CLOACAE   Streptococcus anginosis - MIC*    PENICILLIN <=0.06 SENSITIVE Sensitive     CEFTRIAXONE 0.25 SENSITIVE Sensitive     ERYTHROMYCIN <=0.12 SENSITIVE Sensitive     LEVOFLOXACIN 0.5 SENSITIVE Sensitive     VANCOMYCIN <=0.12 SENSITIVE Sensitive     * MODERATE STREPTOCOCCUS ANGINOSIS  MRSA PCR Screening     Status: None   Collection Time: 02/12/20  6:47 PM   Specimen: Nasopharyngeal  Result Value Ref Range Status   MRSA by PCR NEGATIVE NEGATIVE Final    Comment:        The GeneXpert MRSA Assay (FDA approved for NASAL specimens only), is one component of a comprehensive MRSA colonization surveillance program. It is not intended to diagnose MRSA infection nor to guide or monitor treatment for MRSA infections. Performed at [facility], [address] [place], [place] [address]     Time coordinating discharge: Over 30 minutes  SIGNED:  Lorella Nimrod, MD  [facility] 02/15/2020, 12:54 PM  If 7PM-7AM, please contact night-coverage [email or web address]  This record has been created using Systems analyst. Errors have been sought and corrected,but may not always be located. Such creation errors do not reflect on the standard of care.

## 2020-02-15 NOTE — Care Management Important Message (Signed)
Important Message  Patient Details  Name: Teresa Meyer MRN: [id number] Date of Birth: 1922/09/04   Medicare Important Message Given:  Yes     Dannette Barbara 02/15/2020, 1:21 PM

## 2020-02-15 NOTE — NC FL2 (Signed)
[place] LEVEL OF CARE SCREENING TOOL     IDENTIFICATION  Patient Name: Teresa Meyer Birthdate: [birth year] Sex: female Admission Date (Current Location): 02/12/2020  [facility] and [place] Number:  Engineering geologist and Address:  [facility], [address], [place], [place] [address]      Provider Number: [id number]  Attending Physician Name and Address:  Lorella Nimrod, MD  Relative Name and Phone Number:       Current Level of Care: SNF Recommended Level of Care: [place] Prior Approval Number:    Date Approved/Denied:   PASRR Number:    Discharge Plan: SNF    Current Diagnoses: Patient Active Problem List   Diagnosis Date Noted  . Hypokalemia   . Goals of care, counseling/discussion   . Palliative care by specialist   . Cholecystitis with gangrene of gallbladder   . Hypomagnesemia   . Hyponatremia   . Acute gangrenous cholecystitis 02/12/2020  . Sepsis ([place]) 02/12/2020  . Hearing impairment 02/12/2020  . Skin lesion 08/17/2018  . Iron deficiency anemia 11/12/2017  . Chronic venous insufficiency 11/12/2017  . Multiple rib fractures 11/02/2017  . Fall at home, initial encounter 11/02/2017  . Malignant melanoma ([place]) 04/18/2016  . Macrocytic anemia 12/26/2015  . Pedal edema 10/28/2015  . Systolic murmur 18/29/9371  . Advanced care planning/counseling discussion 05/07/2014  . History of malignant melanoma   . Medicare annual wellness visit, subsequent 09/24/2013  . DNR (do not resuscitate)   . Hypercholesteremia 11/19/2011  . HTN (hypertension) 11/19/2011  . Osteoporosis, post-menopausal 11/19/2011  . GERD 07/15/2009  . ESOPHAGEAL STRICTURE 12/19/2008  . ARTHRITIS 11/26/2008  . HYPERCHOLESTEROLEMIA 11/20/2008  . Essential hypertension 11/20/2008  . DIVERTICULOSIS, COLON 11/20/2008  . Osteopenia 11/20/2008  . Dysphagia 11/20/2008    Orientation RESPIRATION BLADDER Height & Weight     Self,  Situation, Place  Normal Continent Weight: 61.6 kg Height:  5\' 2"  (157.5 cm)  BEHAVIORAL SYMPTOMS/MOOD NEUROLOGICAL BOWEL NUTRITION STATUS      Continent    AMBULATORY STATUS COMMUNICATION OF NEEDS Skin   Limited Assist Verbally Other (Comment) (drain)                       Personal Care Assistance Level of Assistance  Bathing, Dressing Bathing Assistance: Limited assistance   Dressing Assistance: Limited assistance     Functional Limitations Info             SPECIAL CARE FACTORS FREQUENCY  PT (By licensed PT)     PT Frequency: 5x a week              Contractures Contractures Info: Not present    Additional Factors Info  Code Status, Allergies Code Status Info: DNR Allergies Info: bisphosphonates, codeine, statins, sulfa, progaine           Current Medications (02/15/2020):  This is the current hospital active medication list Current Facility-Administered Medications  Medication Dose Route Frequency Provider Last Rate Last Admin  . 0.9 %  sodium chloride infusion   Intravenous PRN Lorella Nimrod, MD 10 mL/hr at 02/15/20 0516 250 mL at 02/15/20 0516  . amoxicillin-clavulanate (AUGMENTIN) 875-125 MG per tablet 1 tablet  1 tablet Oral Q12H Lorella Nimrod, MD      . cholecalciferol (VITAMIN D3) tablet 1,000 Units  1,000 Units Oral Daily Lorella Nimrod, MD   1,000 Units at 02/15/20 0855  . ciprofloxacin (CIPRO) tablet 500 mg  500 mg Oral BID  Lorella Nimrod, MD      . enoxaparin (LOVENOX) injection 40 mg  40 mg Subcutaneous Q24H Lorella Nimrod, MD   40 mg at 02/14/20 2231  . feeding supplement (ENSURE ENLIVE) (ENSURE ENLIVE) liquid 237 mL  237 mL Oral BID BM Lorella Nimrod, MD   237 mL at 02/14/20 1640  . losartan (COZAAR) tablet 25 mg  25 mg Oral Daily Lorella Nimrod, MD   25 mg at 02/15/20 0853  . morphine 2 MG/ML injection 2 mg  2 mg Intravenous Q2H PRN Athena Masse, MD   2 mg at 02/12/20 1551  . multivitamin with minerals tablet 1 tablet  1 tablet Oral Daily Lorella Nimrod, MD   1 tablet at 02/15/20 0855  . nepafenac (ILEVRO) 0.3 % ophthalmic suspension 1 drop  1 drop Left Eye Daily Lorella Nimrod, MD   1 drop at 02/15/20 0907  . ondansetron (ZOFRAN) tablet 4 mg  4 mg Oral Q6H PRN Athena Masse, MD       Or  . ondansetron [facility]) injection 4 mg  4 mg Intravenous Q6H PRN Athena Masse, MD      . prednisoLONE acetate (PRED MILD) 0.12 % ophthalmic suspension 1 drop  1 drop Left Eye Daily Lorella Nimrod, MD      . sodium chloride flush (NS) 0.9 % injection 10-40 mL  10-40 mL Intracatheter Q12H Lorella Nimrod, MD   10 mL at 02/14/20 2232  . sodium chloride flush (NS) 0.9 % injection 10-40 mL  10-40 mL Intracatheter PRN Lorella Nimrod, MD   10 mL at 02/15/20 0513  . sodium chloride flush (NS) 0.9 % injection 5 mL  5 mL Intracatheter Q8H Aletta Edouard, MD   5 mL at 02/15/20 0518     Discharge Medications: Please see discharge summary for a list of discharge medications.  Relevant Imaging Results:  Relevant Lab Results:   Additional Information SSN [id number]  Victorino Dike, RN

## 2020-02-15 NOTE — Progress Notes (Signed)
Pharmacy - Antimicrobial Stewardship  89 YOF with acute gangrenous cholecystitis s/p cholecystostomy tube 9/21  9/21 Gallbladder cx: S. Anginosis (PCN susc) and E. Cloacae (Susc to quinolones and trim/sulfa) 9/21 Blood cx: 1/4 bottles strep spp- Just returned as S. Anginosis.  Consistent with gallbladder culture.    Enterobacter cloacae is an AMP-C producing organism so avoid oral b-lactams,  Oral options are quinolone or trim/sulfa (allergy to sulfa listed with unknown rxn listed)   Plan: 1. Oral options are ciprofloxacin 500mg  PO bid plus amoxicillin/clavulonate 875mg  PO BID (quinolones not best choices for streptococcus so add b-lactam and empiric anaerobic coverage) 2.  Monitor for diarrhea, muscle/tendon pain, etc.     Doreene Eland, PharmD, BCPS.   Work Cell: [phone] 02/15/2020 12:54 PM

## 2020-02-15 NOTE — Progress Notes (Signed)
Patient transferred to [facility] via EMS. Report called and given. Tele and IV d/c'd prior to discharge. This RN attempted to call daughter to maker her aware, no answer and voicemail full.

## 2020-02-15 NOTE — Telephone Encounter (Signed)
Again called Teresa Meyer, unable to leave message as mailbox is full.  Called other daughter [name] - unable to reach, left message.

## 2020-02-15 NOTE — Consult Note (Signed)
WOC consulted for suggestions for care for IR drain.  I have reached out to the palliative care nurse to clarify needs.  Suggestions made for securement of the tube and ways to cover the tube for comfort and discretion.  1. Tube stabilization devices are available: [employer] makes ones and the family or facility can request free samples at the website below.   [email or web address]://[email or web address].com/en/products/critical-care-products/tube-securement/tube-attachment-devices/drain-tube-attachment-device-_dtad_  2. Use mesh or stretch disposable underwear to cover site after using ABD pad to cover, secure tube to the patient's skin without tension.  3. Utilize silicone foam dressings cut like split gauze to stabilize tube and to absorb drainage and protect peritubular skin  Orders updated.   Re consult if needed, will not follow at this time. Thanks  Jaevin Medearis [employer], RN,CWOCN, CNS, [place] [phone])

## 2020-02-16 LAB — CULTURE, BLOOD (ROUTINE X 2): Special Requests: ADEQUATE

## 2020-02-17 LAB — AEROBIC/ANAEROBIC CULTURE W GRAM STAIN (SURGICAL/DEEP WOUND)

## 2020-02-17 LAB — CULTURE, BLOOD (ROUTINE X 2)
Culture: NO GROWTH
Special Requests: ADEQUATE

## 2020-02-18 DIAGNOSIS — M159 Polyosteoarthritis, unspecified: Secondary | ICD-10-CM | POA: Diagnosis not present

## 2020-02-18 DIAGNOSIS — K219 Gastro-esophageal reflux disease without esophagitis: Secondary | ICD-10-CM

## 2020-02-18 DIAGNOSIS — K82A1 Gangrene of gallbladder in cholecystitis: Secondary | ICD-10-CM | POA: Diagnosis not present

## 2020-02-18 DIAGNOSIS — I10 Essential (primary) hypertension: Secondary | ICD-10-CM | POA: Diagnosis not present

## 2020-02-18 NOTE — Telephone Encounter (Signed)
Spoke with daughter [name].  Pt is back at [facility].  [name] is coming into town next week and will try to bring pt to hosp f/u 02/29/2020. If unable to keep, will call us to cancel.

## 2020-02-20 ENCOUNTER — Other Ambulatory Visit: Payer: Self-pay | Admitting: *Deleted

## 2020-02-20 NOTE — Patient Outreach (Signed)
Member screened for potential [facility] Care Management needs as a benefit of [place] Medicare.  Per Patient Teresa Meyer member resides in [place].   Communication sent to [facility] Admission Coordinator to collaborate about anticipated dc plans and potential [facility] Care Management needs.  Will continue to follow for transition plans while member resides in SNF.    Marthenia Rolling, MSN-Ed, RN,BSN [place] Acute Care Coordinator [phone] [facility]) [phone]  (Toll free office)

## 2020-02-21 ENCOUNTER — Telehealth: Payer: Self-pay | Admitting: General Surgery

## 2020-02-21 ENCOUNTER — Other Ambulatory Visit: Payer: Self-pay | Admitting: *Deleted

## 2020-02-21 NOTE — Telephone Encounter (Signed)
Spoke with [name] (patient's daughter) answered all questions she had about the drain that her mother currently has and the questions about removal of drain. Patient was also informed she may contact [facility] and ask them about the transportation for patient. I let her know that in the past that we have had patient's facilities bring patients to their appointments and the family members meet them here at the scheduled appointment, that way they are able to hear about the appointment and then transportation will come and pick them back up from their appointment. [name] verbalized understanding and has no further questions.

## 2020-02-21 NOTE — Patient Outreach (Signed)
[facility] Post- Acute Care Coordinator follow up. Member screened for potential [facility] Care Management needs as a benefit of [employer] Medicare.  Update received from [facility] Admission Coordinator reports member's transition plan will be LTC or ALF pending progress.   Will continue to follow for transition plans while member resides in SNF.    Marthenia Rolling, MSN-Ed, RN,BSN [place] Acute Care Coordinator [phone] [facility]) [phone]  (Toll free office)

## 2020-02-21 NOTE — Telephone Encounter (Signed)
Please call daughter, [name] at [phone].  She has questions about her mother's drain.  Thank you.

## 2020-02-22 ENCOUNTER — Telehealth: Payer: Self-pay | Admitting: Family Medicine

## 2020-02-22 NOTE — Telephone Encounter (Signed)
Daughter aware.

## 2020-02-22 NOTE — Telephone Encounter (Signed)
Teresa Meyer (daughter) called stating pt is at [facility] and sees dr Silvio Pate every week.  She stated pt has a drain tube from gallbladder and pt stated she cannot get dress and does want to come in to see dr g in hospital gown.  Pt stated she see dr Silvio Pate every week and does not why she needs to come in.  Is it ok to cancel appointment on 10/8 and r/s after 10/14 sugerion appointment with dr cannon.

## 2020-02-22 NOTE — Telephone Encounter (Signed)
Sure that is fine. May cancel 10/8 appointment.

## 2020-02-25 ENCOUNTER — Other Ambulatory Visit: Payer: Medicare Other | Admitting: Adult Health Nurse Practitioner

## 2020-02-25 ENCOUNTER — Other Ambulatory Visit: Payer: Self-pay

## 2020-02-25 DIAGNOSIS — K82A1 Gangrene of gallbladder in cholecystitis: Secondary | ICD-10-CM

## 2020-02-25 DIAGNOSIS — Z515 Encounter for palliative care: Secondary | ICD-10-CM

## 2020-02-25 NOTE — Progress Notes (Signed)
[facility] Consult Note Telephone: [phone]  Fax: [phone]  PATIENT NAME: Teresa Meyer DOB: [birth year] MRN: [id number]  PRIMARY CARE PROVIDER:   [name], MD  REFERRING PROVIDER:  Dr. Silvio Pate  RESPONSIBLE PARTY:   Jerlyn Ly, daughter/HCPOA [phone]    RECOMMENDATIONS and PLAN:  1.  Advanced care planning.  Patient is a DNR.  2.  Functional status.  Patient is able to walk with a walker. Staff does report that she does need assistance with bathing but only requires stand by assistance with other ADLs.  Able to feed herself.  She is continent of B&B. She is working with PT.  Continue supportive care at facility and PT as ordered.  3.  Nutritional status.  Patient states that her appetite is improving since being in hospital.  Her weight was 127.4 on 01/30/20.  On 02/22/20 her weight was 129.3.    Spoke with daughter who states that family has had concerns about her being home alone.  Daughter states that they have not discussed with her mother yet that she will be transitioning to ALF once she passes assessment.  Patient is not wanting to learn how to take care of her JP drain.  Will meet with patient in a week to discuss transition to ALF.  This will be hard for her accept as she [place].   Patient had episode in hospital in which she was stating that she no longer wanted to live.  Did discuss with daughter that her mother will not take this news well and will also reach out to SW at facility to let her know so that she and the staff are aware to help support the patient as she hears this news.   Palliative will continue to monitor for symptom management/decline and make recommendations as needed.  Follow up in one week.  Daughter encouraged to call with any questions or concerns.  I spent 50 minutes providing this consultation,  from 1:45 to 2:35 including time spent with patient/family, chart  review, provider coordination, documentation. More than 50% of the time in this consultation was spent coordinating communication.   HISTORY OF PRESENT ILLNESS:  Teresa Meyer is a 86 y.o. year old female with multiple medical problems including osteoporosis, OA, gangrene cholecystitis, HLD, venous insufficiency, HTN, h/o melanoma. Palliative Care was asked to help address goals of care. Patient was in hospital 9/21-9/24/21 for vomiting and diarrhea.  Was found to have gangrene cholecystitis. She had JP drain placed.  She is in SNF for short term rehab.  Patient was living in [place] prior to hospitalization.  Patient states having indigestion today and usually gets relief with TUMS. Has pain in left knee mostly with weight bearing. States that she does not need to take anything for the pain.  Patient denies SOB, cough, sore throat, headaches, dizziness, chest pain, N/V/D, constipation, dysuria, hematuria.  CODE STATUS: DNR  PPS: 50% HOSPICE ELIGIBILITY/DIAGNOSIS: TBD  PHYSICAL EXAM:  HR 80  O2 98%  on RA   General: NAD, frail appearing Cardiovascular: regular rate and rhythm Pulmonary: lung sounds clear; normal respiratory effort Abdomen: soft, nontender, + bowel sounds GU: no suprapubic tenderness Extremities: no edema, no joint deformities Skin: no rashes on exposed skin Neurological: Weakness but otherwise nonfocal   PAST MEDICAL HISTORY:  Past Medical History:  Diagnosis Date  . Arthritis   . Atypical nevi 09/2014   lentiginous dysplastic nevus with mod  atypia ([place])  . Basal cell carcinoma 08/12/2017   R distal dorsum nose   . Diverticulitis   . DNR (do not resuscitate)   . Esophageal stricture 2012  . GERD (gastroesophageal reflux disease)   . HLD (hyperlipidemia)    did not tolerate statin, stopped checking  . HTN (hypertension)   . Leg wound, left, subsequent encounter 11/12/2017  . Malignant melanoma ([place]) 2015, 2017   R chin s/p excision ([place]) R upper back [name])  .  Malignant melanoma ([place]) 07/27/19 WLE at [facility]   R lat deltoid  . Osteoporosis, post-menopausal    bisphosphonate caused dysphagia, requests defer DEXA for now  . Seasonal allergies   . Squamous cell carcinoma of skin 08/21/2018   L med leg below knee  . Squamous cell carcinoma of skin 01/20/2017   R pretibial lateral  . Wears hearing aid     SOCIAL HX:  Social History   Tobacco Use  . Smoking status: Never Smoker  . Smokeless tobacco: Never Used  Substance Use Topics  . Alcohol use: Yes    Alcohol/week: 0.0 standard drinks    Comment: 1/2 glass wine nightly    ALLERGIES:  Allergies  Allergen Reactions  . Bisphosphonates Other (See Comments)    Dysphagia to oral bisphosphonates  . Codeine   . Statins Other (See Comments)    Muscle aches  . Sulfa Antibiotics     Can't recall reaction  . Procaine Hcl Palpitations     PERTINENT MEDICATIONS:  Outpatient Encounter Medications as of 02/25/2020  Medication Sig  . amoxicillin-clavulanate (AUGMENTIN) 875-125 MG tablet Take 1 tablet by mouth every 12 (twelve) hours for 10 days.  . calcium carbonate (TUMS - DOSED IN MG ELEMENTAL CALCIUM) 500 MG chewable tablet Chew 1 tablet by mouth 2 (two) times daily with a meal.  . cholecalciferol (VITAMIN D) 1000 UNITS tablet Take 1,000 Units by mouth daily.  . ciprofloxacin (CIPRO) 500 MG tablet Take 1 tablet (500 mg total) by mouth 2 (two) times daily for 10 days.  . diphenhydramine-acetaminophen (TYLENOL PM) 25-500 MG TABS Take 2 tablets by mouth at bedtime as needed (pain and sleep).   . feeding supplement, ENSURE ENLIVE, (ENSURE ENLIVE) LIQD Take 237 mLs by mouth 2 (two) times daily between meals.  Marland Kitchen losartan (COZAAR) 25 MG tablet Take 1 tablet (25 mg total) by mouth daily.  . Multiple Vitamin (MULTIVITAMIN WITH MINERALS) TABS tablet Take 1 tablet by mouth daily.  . nepafenac (ILEVRO) 0.3 % ophthalmic suspension Place 1 drop into the left eye daily.   . ondansetron (ZOFRAN) 4 MG tablet Take 1  tablet (4 mg total) by mouth every 6 (six) hours as needed for nausea.  . prednisoLONE acetate (PRED MILD) 0.12 % ophthalmic suspension Place 1 drop into the left eye daily.    No facility-administered encounter medications on file as of 02/25/2020.     Avielle Imbert Jenetta Downer, NP

## 2020-02-29 ENCOUNTER — Inpatient Hospital Stay: Payer: Medicare Other | Admitting: Family Medicine

## 2020-03-03 ENCOUNTER — Non-Acute Institutional Stay: Payer: Medicare Other | Admitting: Adult Health Nurse Practitioner

## 2020-03-03 ENCOUNTER — Other Ambulatory Visit: Payer: Self-pay

## 2020-03-03 DIAGNOSIS — Z515 Encounter for palliative care: Secondary | ICD-10-CM

## 2020-03-03 DIAGNOSIS — K82A1 Gangrene of gallbladder in cholecystitis: Secondary | ICD-10-CM

## 2020-03-03 NOTE — Progress Notes (Signed)
[facility] Consult Note Telephone: [phone]  Fax: [phone]  PATIENT NAME: Teresa Meyer DOB: 05-Feb-1923 MRN: [id number]  PRIMARY CARE PROVIDER:   [name], MD  REFERRING PROVIDER:  Dr. Silvio Meyer  RESPONSIBLE PARTY:   Teresa Meyer, daughter/HCPOA [phone]    RECOMMENDATIONS and PLAN:  1.  Advanced care planning.  Patient is a DNR.  Will call daughter to update on visit  2.  Functional status.  Patient is able to walk with a walker. Staff does report that she does need assistance with bathing but only requires stand by assistance with other ADLs.  Able to feed herself.  She is continent of B&B. She is working with PT.  Continue supportive care at facility and PT as ordered.  3.  Situational depression.  Patient does have some depression about moving to ALF but states that she is being realistic.  She continues to work with therapy and is trying to learn how to take care of her drain.  She is eating and sleeping well. Will continue to monitor for signs of complicated grief.    Palliative will continue to monitor for symptom management/decline and make recommendations as needed. Will follow up in 2-4 weeks.  I spent 30 minutes providing this consultation,  from 10:30 to 11:00 including time spent with patient/family, chart review, provider coordination, documentation. More than 50% of the time in this consultation was spent coordinating communication.   HISTORY OF PRESENT ILLNESS:  Teresa Meyer is a 86 y.o. year old female with multiple medical problems including osteoporosis, OA, gangrene cholecystitis, HLD, venous insufficiency, HTN, h/o melanoma. Palliative Care was asked to help address goals of care. [place] but family has told her that she will be transitioning to ALF at [facility].  Have spoken with SW and she states that there really is no time line right now as she still has JP drain  and she cannot have this at the ALF and after this is taken out she more than likely will not have a reason to stay in skilled facility.    CODE STATUS: DNR  PPS: 50% HOSPICE ELIGIBILITY/DIAGNOSIS: TBD  PHYSICAL EXAM:  General: NAD, frail appearing, thin Extremities: no edema, no joint deformities Skin: no rashes on exposed skin Neurological: Weakness but otherwise nonfocal; has forgetfulness   PAST MEDICAL HISTORY:  Past Medical History:  Diagnosis Date  . Arthritis   . Atypical nevi 09/2014   lentiginous dysplastic nevus with mod atypia ([place])  . Basal cell carcinoma 08/12/2017   R distal dorsum nose   . Diverticulitis   . DNR (do not resuscitate)   . Esophageal stricture 2012  . GERD (gastroesophageal reflux disease)   . HLD (hyperlipidemia)    did not tolerate statin, stopped checking  . HTN (hypertension)   . Leg wound, left, subsequent encounter 11/12/2017  . Malignant melanoma ([place]) 2015, 2017   R chin s/p excision ([place]) R upper back [name])  . Malignant melanoma ([place]) 07/27/19 WLE at [facility]   R lat deltoid  . Osteoporosis, post-menopausal    bisphosphonate caused dysphagia, requests defer DEXA for now  . Seasonal allergies   . Squamous cell carcinoma of skin 08/21/2018   L med leg below knee  . Squamous cell carcinoma of skin 01/20/2017   R pretibial lateral  . Wears hearing aid     SOCIAL HX:  Social History   Tobacco Use  . Smoking status:  Never Smoker  . Smokeless tobacco: Never Used  Substance Use Topics  . Alcohol use: Yes    Alcohol/week: 0.0 standard drinks    Comment: 1/2 glass wine nightly    ALLERGIES:  Allergies  Allergen Reactions  . Bisphosphonates Other (See Comments)    Dysphagia to oral bisphosphonates  . Codeine   . Statins Other (See Comments)    Muscle aches  . Sulfa Antibiotics     Can't recall reaction  . Procaine Hcl Palpitations     PERTINENT MEDICATIONS:  Outpatient Encounter Medications as of 03/03/2020  Medication  Sig  . calcium carbonate (TUMS - DOSED IN MG ELEMENTAL CALCIUM) 500 MG chewable tablet Chew 1 tablet by mouth 2 (two) times daily with a meal.  . cholecalciferol (VITAMIN D) 1000 UNITS tablet Take 1,000 Units by mouth daily.  . diphenhydramine-acetaminophen (TYLENOL PM) 25-500 MG TABS Take 2 tablets by mouth at bedtime as needed (pain and sleep).   . feeding supplement, ENSURE ENLIVE, (ENSURE ENLIVE) LIQD Take 237 mLs by mouth 2 (two) times daily between meals.  Marland Kitchen losartan (COZAAR) 25 MG tablet Take 1 tablet (25 mg total) by mouth daily.  . Multiple Vitamin (MULTIVITAMIN WITH MINERALS) TABS tablet Take 1 tablet by mouth daily.  . nepafenac (ILEVRO) 0.3 % ophthalmic suspension Place 1 drop into the left eye daily.   . ondansetron (ZOFRAN) 4 MG tablet Take 1 tablet (4 mg total) by mouth every 6 (six) hours as needed for nausea.  . prednisoLONE acetate (PRED MILD) 0.12 % ophthalmic suspension Place 1 drop into the left eye daily.    No facility-administered encounter medications on file as of 03/03/2020.     Rossie Scarfone Jenetta Downer, NP

## 2020-03-04 ENCOUNTER — Telehealth: Payer: Self-pay | Admitting: Adult Health Nurse Practitioner

## 2020-03-04 NOTE — Telephone Encounter (Signed)
Spoke with daughter to update on yesterday's visit.  Her mom is grieving losing her home and independence and may have to transition to ALF or stay in SNF as a long term care resident.  Discussed that will continue to closely monitor for signs that her grieving does not turn into complicated grieving or major depression.  Will follow up in 2 weeks. Khristian Phillippi K. Olena Heckle NP

## 2020-03-06 ENCOUNTER — Encounter: Payer: Self-pay | Admitting: General Surgery

## 2020-03-06 ENCOUNTER — Telehealth: Payer: Self-pay

## 2020-03-06 ENCOUNTER — Ambulatory Visit (INDEPENDENT_AMBULATORY_CARE_PROVIDER_SITE_OTHER): Payer: Medicare Other | Admitting: General Surgery

## 2020-03-06 ENCOUNTER — Other Ambulatory Visit: Payer: Self-pay

## 2020-03-06 ENCOUNTER — Inpatient Hospital Stay: Payer: Medicare Other | Admitting: General Surgery

## 2020-03-06 VITALS — BP 133/75 | HR 81 | Temp 98.3°F

## 2020-03-06 DIAGNOSIS — K82A1 Gangrene of gallbladder in cholecystitis: Secondary | ICD-10-CM

## 2020-03-06 NOTE — Telephone Encounter (Signed)
Spoke with the patient's daughter, [name], and let her know that she was scheduled for a drain check at [facility]. This is on 03/10/20 at 11:30 am, she is to arrive there by 11:15 am. The patient's daughter will call and notify [facility] so they may provide transportation. She will call us if there is a problem with this date and time.

## 2020-03-06 NOTE — Patient Instructions (Addendum)
We will call you about your drain tube check and let you know when to go. This will be done at [facility].   Continue to do your drain care as ordered by the hospital.  The Radiologist will determine if your drain can come out or not.

## 2020-03-07 NOTE — Progress Notes (Signed)
Teresa Meyer is here today for follow-up from her recent hospitalization.  She is a 86 year old woman who resides in an assisted living facility.  She was brought to the emergency department on February 12, 2020 with a 2-week history of nausea and vomiting.  She had been getting IV fluids on the hospital side of her facility, but her condition acutely worsened in the 24 hours prior to presentation.  She developed abdominal pain, chills, diarrhea, and lack of appetite.  She was found to have cholecystitis with concern for gangrenous cholecystitis.  Due to her frail state and advanced age, this was treated with a percutaneous cholecystostomy tube and IV antibiotics.  She was subsequently discharged to a skilled nursing facility.  She is here today for follow-up.  She is accompanied by her daughter, Jeani Hawking.  Today, she reports that she has not experienced any abdominal pain, other than some soreness at the drain insertion site.  She denies any fevers or chills.  No nausea or vomiting.  She states that her appetite is good.  She would like to return to her prior living facility, but is unable to do so because of the presence of the tube.  Past Medical History:  Diagnosis Date  . Arthritis   . Atypical nevi 09/2014   lentiginous dysplastic nevus with mod atypia ([place])  . Basal cell carcinoma 08/12/2017   R distal dorsum nose   . Diverticulitis   . DNR (do not resuscitate)   . Esophageal stricture 2012  . GERD (gastroesophageal reflux disease)   . HLD (hyperlipidemia)    did not tolerate statin, stopped checking  . HTN (hypertension)   . Leg wound, left, subsequent encounter 11/12/2017  . Malignant melanoma ([place]) 2015, 2017   R chin s/p excision ([place]) R upper back [name])  . Malignant melanoma ([place]) 07/27/19 WLE at [facility]   R lat deltoid  . Osteoporosis, post-menopausal    bisphosphonate caused dysphagia, requests defer DEXA for now  . Seasonal allergies   . Squamous cell carcinoma of skin  08/21/2018   L med leg below knee  . Squamous cell carcinoma of skin 01/20/2017   R pretibial lateral  . Wears hearing aid    Past Surgical History:  Procedure Laterality Date  . BUNIONECTOMY Right 2001  . CARPAL TUNNEL RELEASE Bilateral 1974  . CATARACT EXTRACTION Bilateral    [place]  . TONSILLECTOMY  1928   Family History  Problem Relation Age of Onset  . Pancreatic cancer Son   . Breast cancer Sister   . Lung cancer Mother   . Diabetes Other        nephew  . Stroke Neg Hx   . CAD Neg Hx    Social History   Tobacco Use  . Smoking status: Never Smoker  . Smokeless tobacco: Never Used  Vaping Use  . Vaping Use: Never used  Substance Use Topics  . Alcohol use: Yes    Alcohol/week: 0.0 standard drinks    Comment: 1/2 glass wine nightly  . Drug use: No   Current Meds  Medication Sig  . calcium carbonate (TUMS - DOSED IN MG ELEMENTAL CALCIUM) 500 MG chewable tablet Chew 1 tablet by mouth 2 (two) times daily with a meal.  . cholecalciferol (VITAMIN D) 1000 UNITS tablet Take 1,000 Units by mouth daily.  . diphenhydramine-acetaminophen (TYLENOL PM) 25-500 MG TABS Take 2 tablets by mouth at bedtime as needed (pain and sleep).   . feeding supplement, ENSURE ENLIVE, (ENSURE ENLIVE) LIQD  Take 237 mLs by mouth 2 (two) times daily between meals.  Marland Kitchen losartan (COZAAR) 25 MG tablet Take 1 tablet (25 mg total) by mouth daily.  . Multiple Vitamin (MULTIVITAMIN WITH MINERALS) TABS tablet Take 1 tablet by mouth daily.  . nepafenac (ILEVRO) 0.3 % ophthalmic suspension Place 1 drop into the left eye daily.   . prednisoLONE acetate (PRED MILD) 0.12 % ophthalmic suspension Place 1 drop into the left eye daily.    Allergies  Allergen Reactions  . Bisphosphonates Other (See Comments)    Dysphagia to oral bisphosphonates  . Codeine   . Statins Other (See Comments)    Muscle aches  . Sulfa Antibiotics     Can't recall reaction  . Procaine Hcl Palpitations   Today's Vitals    03/06/20 1119  BP: 133/75  Pulse: 81  Temp: 98.3 F (36.8 C)  SpO2: 96%   There is no height or weight on file to calculate BMI. Physical Exam Constitutional:      General: She is not in acute distress.    Appearance: Normal appearance.  HENT:     Head: Normocephalic and atraumatic.     Ears:     Comments: Hard of hearing    Mouth/Throat:     Mouth: Mucous membranes are moist.     Pharynx: Oropharynx is clear.  Eyes:     General: No scleral icterus.       Right eye: No discharge.        Left eye: No discharge.  Neck:     Comments: No palpable cervical or supraclavicular lymphadenopathy.  The trachea is midline.  No dominant masses appreciated in the thyroid gland, which moves freely with deglutition. Cardiovascular:     Rate and Rhythm: Normal rate and regular rhythm.  Pulmonary:     Effort: Pulmonary effort is normal. No respiratory distress.  Abdominal:     General: Bowel sounds are normal.     Palpations: Abdomen is soft.     Comments: There is a cholecystostomy tube in the right upper quadrant.  It is draining clear light green bile.  The insertion site does not have any surrounding erythema, induration, or drainage.  Genitourinary:    Comments: Deferred Musculoskeletal:        General: No deformity or signs of injury.  Skin:    General: Skin is warm and dry.  Neurological:     General: No focal deficit present.     Mental Status: She is alert. Mental status is at baseline.  Psychiatric:        Mood and Affect: Mood normal.        Behavior: Behavior normal.   Impression and plan: This is a 86 year old woman who presented to the hospital with acute, possibly gangrenous, cholecystitis.  She is in frail condition and due to her advanced age, she was treated conservatively with percutaneous cholecystostomy tube and IV antibiotics.  I still believe she is a poor surgical candidate.  We will have her seen in the radiology drain clinic to determine whether or not she is a  candidate for internalization.  Hopefully, we will be able to remove the tube.  We will work on getting this arranged for her, and see her in clinic on an as-needed basis.

## 2020-03-10 ENCOUNTER — Ambulatory Visit
Admission: RE | Admit: 2020-03-10 | Discharge: 2020-03-10 | Disposition: A | Discharge status: Home / Self Care | Payer: Medicare Other | Source: Ambulatory Visit | Attending: General Surgery | Admitting: General Surgery

## 2020-03-10 ENCOUNTER — Other Ambulatory Visit: Payer: Self-pay

## 2020-03-10 DIAGNOSIS — K82A1 Gangrene of gallbladder in cholecystitis: Secondary | ICD-10-CM | POA: Insufficient documentation

## 2020-03-10 MED ORDER — IOHEXOL 180 MG/ML  SOLN
20.0000 mL | Freq: Once | INTRAMUSCULAR | Status: AC | PRN
  Administered 2020-03-10: 20 mL

## 2020-03-11 ENCOUNTER — Telehealth: Payer: Self-pay | Admitting: General Surgery

## 2020-03-11 NOTE — Telephone Encounter (Signed)
Patients daughter called asking to speak with you, please call patient and advise.

## 2020-03-11 NOTE — Telephone Encounter (Signed)
Patient's daughter [name] was calling about the results of her mother's drain study and the next steps. She would like to speak with Dr Celine Ahr about this.

## 2020-03-11 NOTE — Telephone Encounter (Signed)
I spoke with the patient's daughter, Teresa Meyer, regarding the results of the tube cholangiogram performed yesterday.  Basically, the cystic duct is completely blocked with small stones and debris, making it unlikely that it will ever open.  The options for treatment, from my perspective, include leaving the tube in place indefinitely, with scheduled tube checks and changes per radiology protocol, removing the tube with the knowledge that the patient will likely develop cholecystitis again and either require replacement of the tube, or she would become septic and this would ultimately result in her demise, or the third option, would be cholecystectomy.  In a patient of this age and with frailty and medical comorbidities, I think surgery is extremely high risk and could also lead to the patient's death, either intraoperatively or shortly thereafter.  After discussing this and answering her questions, Teresa Meyer indicated that she would like to chat further with her family before making a decision.  She will contact our office once they have done so and we will make appropriate plans based upon their conclusions.

## 2020-03-12 ENCOUNTER — Ambulatory Visit: Payer: Medicare Other | Admitting: Dermatology

## 2020-03-12 ENCOUNTER — Other Ambulatory Visit: Payer: Self-pay

## 2020-03-12 ENCOUNTER — Non-Acute Institutional Stay: Payer: Medicare Other | Admitting: Adult Health Nurse Practitioner

## 2020-03-12 DIAGNOSIS — Z515 Encounter for palliative care: Secondary | ICD-10-CM

## 2020-03-12 DIAGNOSIS — K82A1 Gangrene of gallbladder in cholecystitis: Secondary | ICD-10-CM

## 2020-03-12 NOTE — Progress Notes (Signed)
[facility] Consult Note Telephone: [phone]  Fax: [phone]  PATIENT NAME: Teresa Meyer DOB: May 24, 1923 MRN: [id number]  PRIMARY CARE PROVIDER:   [name], MD  REFERRING PROVIDER:  Dr. Silvio Pate  RESPONSIBLE PARTY:[name] Rutherford Nail, daughter/HCPOA [phone]   RECOMMENDATIONS and PLAN: 1.Advanced care planning. Patient is a DNR.  2.  Functional status. Patient is able to walk with a walker. Staff does report that she does need assistance with bathing but only requires stand by assistance with other ADLs. Able to feed herself. She is continent of B&B. She is working with PT. Continue supportive care at facility and PT as ordered.  3.  Situational depression.  Patient does have some depression about moving to ALF but states that she is being realistic.  Patient voices today that she does not want to go to ALF.  She states that she wants to stay at [facility]. She is accepting of the fact that she will not go back to [place], even though she does not like it. This may be her option as recent scan of gallbladder showed that bile duct is completely blocked with stones.  Options are to permanently keep drain in, remove drain and this would lead to her demise, or surgical removal in which she may not survive surgery at her age and frailty.  Patient voices that she has had a good life and has a good relationship with her God and is ready to leave this world. Family has not spoken with her about her options about the drain.  I have spoken with the daughter via phone and have encouraged her and the family to discuss with her what she would want to do.  Will continue to monitor for complicated grief.  Palliative will continue to monitor for symptom management/decline and make recommendations as needed. Will follow up in 2-4 weeks.  I spent 30 minutes providing this consultation,  from 9:30 to 10:00including time spent with patient/family,  chart review, provider coordination, documentation . More than 50% of the time in this consultation was spent coordinating communication.   HISTORY OF PRESENT ILLNESS:  Teresa Meyer is a 86 y.o. year old female with multiple medical problems including osteoporosis, OA, gangrene cholecystitis, HLD, venous insufficiency, HTN, h/o melanoma. Palliative Care was asked to help address goals of care.   CODE STATUS: DNR  PPS: 50% HOSPICE ELIGIBILITY/DIAGNOSIS: TBD  PHYSICAL EXAM:  General: NAD, frail appearing, thin Extremities: no edema, no joint deformities Skin: no rashes on exposed skin Neurological: Weakness but otherwise nonfocal; has forgetfulness  PAST MEDICAL HISTORY:  Past Medical History:  Diagnosis Date  . Arthritis   . Atypical nevi 09/2014   lentiginous dysplastic nevus with mod atypia ([place])  . Basal cell carcinoma 08/12/2017   R distal dorsum nose   . Diverticulitis   . DNR (do not resuscitate)   . Esophageal stricture 2012  . GERD (gastroesophageal reflux disease)   . HLD (hyperlipidemia)    did not tolerate statin, stopped checking  . HTN (hypertension)   . Leg wound, left, subsequent encounter 11/12/2017  . Malignant melanoma ([place]) 2015, 2017   R chin s/p excision ([place]) R upper back [name])  . Malignant melanoma ([place]) 07/27/19 WLE at [facility]   R lat deltoid  . Osteoporosis, post-menopausal    bisphosphonate caused dysphagia, requests defer DEXA for now  . Seasonal allergies   . Squamous cell carcinoma of skin 08/21/2018   L med leg below knee  .  Squamous cell carcinoma of skin 01/20/2017   R pretibial lateral  . Wears hearing aid     SOCIAL HX:  Social History   Tobacco Use  . Smoking status: Never Smoker  . Smokeless tobacco: Never Used  Substance Use Topics  . Alcohol use: Yes    Alcohol/week: 0.0 standard drinks    Comment: 1/2 glass wine nightly    ALLERGIES:  Allergies  Allergen Reactions  . Bisphosphonates Other (See Comments)    Dysphagia  to oral bisphosphonates  . Codeine   . Statins Other (See Comments)    Muscle aches  . Sulfa Antibiotics     Can't recall reaction  . Procaine Hcl Palpitations     PERTINENT MEDICATIONS:  Outpatient Encounter Medications as of 03/12/2020  Medication Sig  . calcium carbonate (TUMS - DOSED IN MG ELEMENTAL CALCIUM) 500 MG chewable tablet Chew 1 tablet by mouth 2 (two) times daily with a meal.  . cholecalciferol (VITAMIN D) 1000 UNITS tablet Take 1,000 Units by mouth daily.  . diphenhydramine-acetaminophen (TYLENOL PM) 25-500 MG TABS Take 2 tablets by mouth at bedtime as needed (pain and sleep).   . feeding supplement, ENSURE ENLIVE, (ENSURE ENLIVE) LIQD Take 237 mLs by mouth 2 (two) times daily between meals.  Marland Kitchen losartan (COZAAR) 25 MG tablet Take 1 tablet (25 mg total) by mouth daily.  . Multiple Vitamin (MULTIVITAMIN WITH MINERALS) TABS tablet Take 1 tablet by mouth daily.  . nepafenac (ILEVRO) 0.3 % ophthalmic suspension Place 1 drop into the left eye daily.   . ondansetron (ZOFRAN) 4 MG tablet Take 1 tablet (4 mg total) by mouth every 6 (six) hours as needed for nausea. (Patient not taking: Reported on 03/06/2020)  . prednisoLONE acetate (PRED MILD) 0.12 % ophthalmic suspension Place 1 drop into the left eye daily.    No facility-administered encounter medications on file as of 03/12/2020.    Kimothy Kishimoto Jenetta Downer, NP

## 2020-03-17 DIAGNOSIS — I10 Essential (primary) hypertension: Secondary | ICD-10-CM

## 2020-03-17 DIAGNOSIS — K819 Cholecystitis, unspecified: Secondary | ICD-10-CM | POA: Diagnosis not present

## 2020-03-17 DIAGNOSIS — K219 Gastro-esophageal reflux disease without esophagitis: Secondary | ICD-10-CM | POA: Diagnosis not present

## 2020-03-17 DIAGNOSIS — M159 Polyosteoarthritis, unspecified: Secondary | ICD-10-CM | POA: Diagnosis not present

## 2020-03-20 ENCOUNTER — Telehealth: Payer: Self-pay | Admitting: Family Medicine

## 2020-03-20 NOTE — Telephone Encounter (Signed)
Explained that there was only clear watery drainage and not sure she still needs it. We have capped it and if she has any symptoms, we will hook the bag back up

## 2020-03-20 NOTE — Telephone Encounter (Signed)
Pt's daughter, [name] [facility]), left vm at triage.  She requests a call back before the weekend concerning previous message.  [name] can be reached at [phone].

## 2020-03-20 NOTE — Telephone Encounter (Signed)
[name] (daughter) called  Stating her mom had gallbladder infection and has drain for at least 6 weeks.  She saw Radiology 10/18 who said drain  had to remain in. He stated pt had lots of gallstone.    She stated dr Silvio Pate saw pt today at [facility] and told nurses to cap drain.  [name] is worried that drain will get stop up with gallstones.    [name] would like a call back  ASAP

## 2020-04-03 ENCOUNTER — Ambulatory Visit (INDEPENDENT_AMBULATORY_CARE_PROVIDER_SITE_OTHER): Payer: Medicare Other | Admitting: General Surgery

## 2020-04-03 ENCOUNTER — Encounter: Payer: Self-pay | Admitting: General Surgery

## 2020-04-03 ENCOUNTER — Other Ambulatory Visit: Payer: Self-pay

## 2020-04-03 ENCOUNTER — Other Ambulatory Visit: Payer: Self-pay | Admitting: General Surgery

## 2020-04-03 VITALS — BP 119/87 | HR 86 | Temp 98.0°F | Ht 62.0 in | Wt 118.8 lb

## 2020-04-03 DIAGNOSIS — K82A1 Gangrene of gallbladder in cholecystitis: Secondary | ICD-10-CM

## 2020-04-03 NOTE — Progress Notes (Signed)
Teresa Meyer is here today for unscheduled follow-up.  She is a 86 year old woman who was hospitalized in September with gangrenous cholecystitis.  Due to her frailty and advanced age, she was treated with percutaneous cholecystostomy drainage and IV antibiotics.  I saw her in my clinic on March 06, 2020.  At that visit, we arranged for a cholangiogram.  This was performed on October 18.  This study demonstrated that the cystic duct was completely obstructed with stones and debris and it seemed unlikely that this would ever open up to allow for normal drainage.  I discussed this in significant detail with the patient's daughter, Teresa Meyer.  The options for treatment, from my perspective, include leaving the tube in place indefinitely, with scheduled tube checks and changes per radiology protocol, removing the tube with the knowledge that the patient will likely develop cholecystitis again and either require replacement of the tube, or she would become septic and this would ultimately result in her demise, or the third option, would be cholecystectomy.  In a patient of this age and with frailty and medical comorbidities, I think surgery is extremely high risk and could also lead to the patient's death, either intraoperatively or shortly thereafter.  After discussing this and answering her questions, Teresa Meyer indicated that she would like to chat further with her family before making a decision. I did not ever hear back from [facility].  Apparently, the physician overseeing her nursing facility decided to cap the drain, as it was only putting out clear watery fluid.  She is now leaking green-tinged mucoid fluid around the drain.  She has been sent back to surgery clinic for evaluation.  There is no family member accompanying her today, but there is a Psychologist, sport and exercise or nurse's aide from her facility with her.  Ms. Teresa Meyer is fairly agitated and does not seem to completely comprehend everything that she is being told,  frequently asking the same question multiple times.  We were able to elicit that she is not experiencing any nausea or vomiting and has not had any fevers or chills.  She denies any pain but says that she is itching at the site.  It is reported that the nursing staff are still flushing the drain daily.  Past Medical History:  Diagnosis Date  . Arthritis   . Atypical nevi 09/2014   lentiginous dysplastic nevus with mod atypia ([place])  . Basal cell carcinoma 08/12/2017   R distal dorsum nose   . Diverticulitis   . DNR (do not resuscitate)   . Esophageal stricture 2012  . GERD (gastroesophageal reflux disease)   . HLD (hyperlipidemia)    did not tolerate statin, stopped checking  . HTN (hypertension)   . Leg wound, left, subsequent encounter 11/12/2017  . Malignant melanoma ([place]) 2015, 2017   R chin s/p excision ([place]) R upper back [name])  . Malignant melanoma ([place]) 07/27/19 WLE at [facility]   R lat deltoid  . Osteoporosis, Meyer-menopausal    bisphosphonate caused dysphagia, requests defer DEXA for now  . Seasonal allergies   . Squamous cell carcinoma of skin 08/21/2018   L med leg below knee  . Squamous cell carcinoma of skin 01/20/2017   R pretibial lateral  . Wears hearing aid    Past Surgical History:  Procedure Laterality Date  . BUNIONECTOMY Right 2001  . CARPAL TUNNEL RELEASE Bilateral 1974  . CATARACT EXTRACTION Bilateral    [place]  . TONSILLECTOMY  1928   Family History  Problem Relation Age  of Onset  . Pancreatic cancer Son   . Breast cancer Sister   . Lung cancer Mother   . Diabetes Other        nephew  . Stroke Neg Hx   . CAD Neg Hx    Social History   Tobacco Use  . Smoking status: Never Smoker  . Smokeless tobacco: Never Used  Vaping Use  . Vaping Use: Never used  Substance Use Topics  . Alcohol use: Yes    Alcohol/week: 0.0 standard drinks    Comment: 1/2 glass wine nightly  . Drug use: No   Current Meds  Medication Sig  . calcium carbonate  (TUMS - DOSED IN MG ELEMENTAL CALCIUM) 500 MG chewable tablet Chew 1 tablet by mouth 2 (two) times daily with a meal.  . cholecalciferol (VITAMIN D) 1000 UNITS tablet Take 1,000 Units by mouth daily.  . diphenhydramine-acetaminophen (TYLENOL PM) 25-500 MG TABS Take 2 tablets by mouth at bedtime as needed (pain and sleep).   . losartan (COZAAR) 25 MG tablet Take 1 tablet (25 mg total) by mouth daily.  . Multiple Vitamin (MULTIVITAMIN WITH MINERALS) TABS tablet Take 1 tablet by mouth daily.  . nepafenac (ILEVRO) 0.3 % ophthalmic suspension Place 1 drop into the left eye daily.   . prednisoLONE acetate (PRED MILD) 0.12 % ophthalmic suspension Place 1 drop into the left eye daily.    Allergies  Allergen Reactions  . Bisphosphonates Other (See Comments)    Dysphagia to oral bisphosphonates  . Codeine   . Statins Other (See Comments)    Muscle aches  . Sulfa Antibiotics     Can't recall reaction  . Procaine Hcl Palpitations   Today's Vitals   04/03/20 1003  BP: 119/87  Pulse: 86  Temp: 98 F (36.7 C)  TempSrc: Oral  SpO2: 97%  Weight: 118 lb 12.8 oz (53.9 kg)  Height: 5\' 2"  (1.575 m)  PainSc: 0-No pain  PainLoc: Abdomen   Body mass index is 21.73 kg/m. Physical Exam HENT:     Head: Normocephalic and atraumatic.     Nose: Nose normal.     Mouth/Throat:     Mouth: Mucous membranes are moist.  Eyes:     General: No scleral icterus.       Right eye: No discharge.        Left eye: No discharge.  Cardiovascular:     Rate and Rhythm: Normal rate and regular rhythm.  Pulmonary:     Effort: Pulmonary effort is normal. No respiratory distress.  Abdominal:     Palpations: Abdomen is soft.     Tenderness: There is no abdominal tenderness.     Comments: Cholecystostomy tube in the right upper quadrant.  The sutures are still intact and there is no evidence of the drain migrating.  There is green-tinged mucus surrounding the exit site and on the patient's clothing, as well as on a  washcloth she placed in the area.  Genitourinary:    Comments: Deferred Musculoskeletal:        General: No swelling or tenderness.  Skin:    General: Skin is warm and dry.     Coloration: Skin is not jaundiced.  Neurological:     General: No focal deficit present.     Mental Status: She is alert. Mental status is at baseline.  Psychiatric:        Behavior: Behavior normal.    Labs: No new labs have been obtained  Imaging: I have reviewed  this imaging personally and concur with the radiologist's interpretation.    INDICATION: Acute calculus cholecystitis, status Meyer cholecystostomy 02/12/2020  EXAM: CHOLANGIOGRAM THROUGH EXISTING CHOLECYSTOSTOMY  MEDICATIONS: NONE.  ANESTHESIA/SEDATION: None.  FLUOROSCOPY TIME:  Fluoroscopy Time: 0 minutes 42 seconds (12.4 mGy).  COMPLICATIONS: None immediate.  PROCEDURE: Under sterile conditions, the existing cholecystostomy was injected with contrast. Cholangiogram performed.  Cholecystostomy catheter is within the gallbladder. Filling defects noted compatible with numerous gallstones. Gallstones are also noted in the cystic duct resulting in obstruction. Contrast does not freely drain into the biliary tree. Common bile duct not identified.  IMPRESSION: Cystic duct obstruction secondary to cholelithiasis.  Impression and plan: This is a 86 year old woman who presented to the hospital septic from acute cholecystitis.  Due to her frailty, she was treated with percutaneous drainage and IV antibiotics.  Recent tube interrogation demonstrates that the cystic duct is completely obstructed.  I discussed the implications of this in extensive detail with the patient's daughter, but never heard back from them regarding the family decision as to what to do.  In the interim, her tube was capped.  She is now having drainage around the tube.  I have recommended that the tube be returned to dependent drainage with daily flushes as  recommended by radiology.  We will work on arranging a tube cholangiogram and possible tube exchange with interventional radiology.  I still believe that her age and frailty are prohibitive as far as performing cholecystectomy.  Any further issues with her tube should be directed to interventional radiology, as they will be the group managing this.  I will see her on an as-needed basis.  I spent greater than 50% of this 30-minute visit counseling and coordinating patient care.

## 2020-04-03 NOTE — Patient Instructions (Addendum)
Dr.Cannon recommends patient to follow up with [facility] at Radiology Department to have drain exchanged. Dr.Cannon discussed with patient that it should be a bag attached to the drain.  Will we contact patient once we have received any information regarding the drain appointment.  Wound Care, Adult Taking care of your wound properly can help to prevent pain, infection, and scarring. It can also help your wound to heal more quickly. How to care for your wound Wound care      Follow instructions from your health care provider about how to take care of your wound. Make sure you: ? Wash your hands with soap and water before you change the bandage (dressing). If soap and water are not available, use hand sanitizer. ? Change your dressing as told by your health care provider. ? Leave stitches (sutures), skin glue, or adhesive strips in place. These skin closures may need to stay in place for 2 weeks or longer. If adhesive strip edges start to loosen and curl up, you may trim the loose edges. Do not remove adhesive strips completely unless your health care provider tells you to do that.  Check your wound area every day for signs of infection. Check for: ? Redness, swelling, or pain. ? Fluid or blood. ? Warmth. ? Pus or a bad smell.  Ask your health care provider if you should clean the wound with mild soap and water. Doing this may include: ? Using a clean towel to pat the wound dry after cleaning it. Do not rub or scrub the wound. ? Applying a cream or ointment. Do this only as told by your health care provider. ? Covering the incision with a clean dressing.  Ask your health care provider when you can leave the wound uncovered.  Keep the dressing dry until your health care provider says it can be removed. Do not take baths, swim, use a hot tub, or do anything that would put the wound underwater until your health care provider approves. Ask your health care provider if you can take  showers. You may only be allowed to take sponge baths. Medicines   If you were prescribed an antibiotic medicine, cream, or ointment, take or use the antibiotic as told by your health care provider. Do not stop taking or using the antibiotic even if your condition improves.  Take over-the-counter and prescription medicines only as told by your health care provider. If you were prescribed pain medicine, take it 30 or more minutes before you do any wound care or as told by your health care provider. General instructions  Return to your normal activities as told by your health care provider. Ask your health care provider what activities are safe.  Do not scratch or pick at the wound.  Do not use any products that contain nicotine or tobacco, such as cigarettes and e-cigarettes. These may delay wound healing. If you need help quitting, ask your health care provider.  Keep all follow-up visits as told by your health care provider. This is important.  Eat a diet that includes protein, vitamin A, vitamin C, and other nutrient-rich foods to help the wound heal. ? Foods rich in protein include meat, dairy, beans, nuts, and other sources. ? Foods rich in vitamin A include carrots and dark green, leafy vegetables. ? Foods rich in vitamin C include citrus, tomatoes, and other fruits and vegetables. ? Nutrient-rich foods have protein, carbohydrates, fat, vitamins, or minerals. Eat a variety of healthy foods including vegetables, fruits, and  whole grains. Contact a health care provider if:  You received a tetanus shot and you have swelling, severe pain, redness, or bleeding at the injection site.  Your pain is not controlled with medicine.  You have redness, swelling, or pain around the wound.  You have fluid or blood coming from the wound.  Your wound feels warm to the touch.  You have pus or a bad smell coming from the wound.  You have a fever or chills.  You are nauseous or you  vomit.  You are dizzy. Get help right away if:  You have a red streak going away from your wound.  The edges of the wound open up and separate.  Your wound is bleeding, and the bleeding does not stop with gentle pressure.  You have a rash.  You faint.  You have trouble breathing. Summary  Always wash your hands with soap and water before changing your bandage (dressing).  To help with healing, eat foods that are rich in protein, vitamin A, vitamin C, and other nutrients.  Check your wound every day for signs of infection. Contact your health care provider if you suspect that your wound is infected. This information is not intended to replace advice given to you by your health care provider. Make sure you discuss any questions you have with your health care provider. Document Revised: 08/28/2018 Document Reviewed: 11/25/2015 Elsevier Patient Education  [place].

## 2020-04-04 ENCOUNTER — Other Ambulatory Visit: Payer: Self-pay

## 2020-04-04 ENCOUNTER — Ambulatory Visit
Admission: RE | Admit: 2020-04-04 | Discharge: 2020-04-04 | Disposition: A | Discharge status: Home / Self Care | Payer: Medicare Other | Source: Ambulatory Visit | Attending: General Surgery | Admitting: General Surgery

## 2020-04-04 DIAGNOSIS — Z434 Encounter for attention to other artificial openings of digestive tract: Secondary | ICD-10-CM | POA: Insufficient documentation

## 2020-04-04 DIAGNOSIS — K82A1 Gangrene of gallbladder in cholecystitis: Secondary | ICD-10-CM | POA: Diagnosis not present

## 2020-04-04 DIAGNOSIS — I10 Essential (primary) hypertension: Secondary | ICD-10-CM | POA: Diagnosis not present

## 2020-04-04 DIAGNOSIS — K219 Gastro-esophageal reflux disease without esophagitis: Secondary | ICD-10-CM | POA: Diagnosis not present

## 2020-04-04 DIAGNOSIS — M1990 Unspecified osteoarthritis, unspecified site: Secondary | ICD-10-CM | POA: Diagnosis not present

## 2020-04-04 DIAGNOSIS — K819 Cholecystitis, unspecified: Secondary | ICD-10-CM | POA: Diagnosis not present

## 2020-04-04 HISTORY — PX: IR CHOLANGIOGRAM EXISTING TUBE: IMG6040

## 2020-04-04 MED ORDER — IODIXANOL 320 MG/ML IV SOLN
50.0000 mL | Freq: Once | INTRAVENOUS | Status: AC | PRN
  Administered 2020-04-04: 13:00:00 10 mL

## 2020-04-04 NOTE — Progress Notes (Signed)
Patient arrived to recovery, cholecystostomy tube exchanged and placed to a bag.  Per provider:  Dr. Serafina Royals please keep cholecystostomy tube to bag drainage.  Note sent with patient and caregiver, reviewed with POA nephew "Warner Mccreedy"

## 2020-04-04 NOTE — Procedures (Signed)
Interventional Radiology Procedure Note  Procedure: Cholecystostomy tube check and exchange  Findings: Please refer to procedural dictation for full description.  Indwelling cholecystostomy in good position.  Patent cystic duct and partially obstructive distal common choledocholithiasis.  Catheter exchanged successfully, placed to bag drainage.  Complications: None immediate  Estimated Blood Loss: None  Recommendations: Please keep cholecystostomy tube to bag drainage.  Please direct any questions regarding capping the cholecystostomy tube to Interventional Radiology.   Plan to return in 8-12 weeks for routine check and exchange.   Ruthann Cancer, MD Pager: [phone]

## 2020-04-04 NOTE — Progress Notes (Signed)
Report called to [facility] nurse:  Teresa Meyer. Report also given to caregiver. Patient without c/o's upon discharge.  Refused sandwich while in recovery, "I'll get something when I get back"  Tolerated 168ml water.  Also reviewed procedure/post op with nephew POA:  [name].  All verbalize understanding.  Cholecystostomy tube placed to bag, strap in use.

## 2020-04-30 ENCOUNTER — Telehealth: Payer: Self-pay | Admitting: General Surgery

## 2020-04-30 NOTE — Telephone Encounter (Signed)
Patients daughter is calling and is asking for one of the nurses to give her a call has some questions about her mothers appt. Please call patient and advise.

## 2020-04-30 NOTE — Telephone Encounter (Signed)
Spoke with the patient's daughter and she states that her mom had scheduled an appointment with [facility] for Friday due to her drain tube. She had stated that it bothered her. The nurses at the facility have looked at the drain and there is no redness, heat, or drainage around the area, and it is not tender to touch and the tube is draining freely. Her mother is easily confused. No need for her appointment at this time and we have canceled her appointment per her daughter. She is aware to call with any further drain concerns and we would be happy to see her.

## 2020-05-02 ENCOUNTER — Ambulatory Visit: Payer: Medicare Other | Admitting: Physician Assistant

## 2020-05-07 ENCOUNTER — Other Ambulatory Visit: Payer: Self-pay

## 2020-05-07 ENCOUNTER — Non-Acute Institutional Stay: Payer: Medicare Other | Admitting: Adult Health Nurse Practitioner

## 2020-05-07 DIAGNOSIS — Z515 Encounter for palliative care: Secondary | ICD-10-CM

## 2020-05-07 DIAGNOSIS — K82A1 Gangrene of gallbladder in cholecystitis: Secondary | ICD-10-CM

## 2020-05-07 DIAGNOSIS — F339 Major depressive disorder, recurrent, unspecified: Secondary | ICD-10-CM

## 2020-05-07 NOTE — Progress Notes (Signed)
[facility] Consult Note Telephone: [phone]  Fax: [phone]  PATIENT NAME: Teresa Meyer DOB: 1923/02/18 MRN: [id number]  PRIMARY CARE PROVIDER:   Dr. Colin Broach PROVIDER:  Dr. Silvio Pate  RESPONSIBLE PARTY:   Jerlyn Ly, daughter/HCPOA [phone]  Chief complaint: follow up palliative visit/depression    RECOMMENDATIONS and PLAN:  1.  Advanced care planning.  Patient is a DNR.  Spoke with daughter via phone to update on visit and she had no new concerns  2.  Cholecystitis with gangrene of gallbladder.  She will more than likely have to have the drain the rest of her life.  Continue care of drain at facility  3.  Depression.  Patient is not showing any depression today.  She has acclimated well to SNF.  She is getting around the facility on her own though not interested in participating in their activities.  She does have more of the things she enjoys from her home with her now such as her laptop.  No interventions warranted at this time as patient is not displaying any signs of depression  Patient doing well in new home.  Palliative will continue to monitor for symptom management/decline and make recommendations as needed.  Will follow up in 8-10 weeks.  Daughter encouraged to call with any questions or concerns.    I spent 30 minutes providing this consultation. More than 50% of the time in this consultation was spent coordinating communication.   HISTORY OF PRESENT ILLNESS:  Teresa Meyer is a 86 y.o. year old female with multiple medical problems including osteoporosis, OA, gangrene cholecystitis, HLD, venous insufficiency, HTN, h/o melanoma. Palliative Care was asked to help address goals of care. Patient has transitioned to new SNF at [facility].  She is liking her room her better.  She has no new complaints today.  Staff has no new concerns.  Weight stable at 119 pounds.  Patient will continue to keep drain for  clogged bile duct.  She wears the bag for the drain in a pink fanny pack.  She gets out of her room to go for walks in the facility.  States that she is not interested in the activities at the facility. She is having forgetfulness and does not remember this provider even though we have met 3 times before. 10 point ROS asked and negative.  No reported falls, infection, or hospital visits since last visit.  CODE STATUS: DNR  PPS: 50% HOSPICE ELIGIBILITY/DIAGNOSIS: TBD  PHYSICAL EXAM:   General: NAD, frail appearing, thin Eyes: sclera anicteric and noninjected with no discharge noted ENMT: moist mucous membranes Cardiovascular: regular rate and rhythm Pulmonary: lung sounds clear; normal respiratory effort Abdomen: soft, nontender, + bowel sounds Extremities: 1+ edema to bilateral lower extremities, no joint deformities Skin: no rashes on exposed skin Neurological: Weakness but otherwise nonfocal; has forgetfulness and short term memory loss   PAST MEDICAL HISTORY:  Past Medical History:  Diagnosis Date  . Arthritis   . Atypical nevi 09/2014   lentiginous dysplastic nevus with mod atypia ([place])  . Basal cell carcinoma 08/12/2017   R distal dorsum nose   . Diverticulitis   . DNR (do not resuscitate)   . Esophageal stricture 2012  . GERD (gastroesophageal reflux disease)   . HLD (hyperlipidemia)    did not tolerate statin, stopped checking  . HTN (hypertension)   . Leg wound, left, subsequent encounter 11/12/2017  . Malignant melanoma ([place]) 2015, 2017   R  chin s/p excision ([place]) R upper back [name])  . Malignant melanoma ([place]) 07/27/19 WLE at [facility]   R lat deltoid  . Osteoporosis, post-menopausal    bisphosphonate caused dysphagia, requests defer DEXA for now  . Seasonal allergies   . Squamous cell carcinoma of skin 08/21/2018   L med leg below knee  . Squamous cell carcinoma of skin 01/20/2017   R pretibial lateral  . Wears hearing aid     SOCIAL HX:  Social History    Tobacco Use  . Smoking status: Never Smoker  . Smokeless tobacco: Never Used  Substance Use Topics  . Alcohol use: Yes    Alcohol/week: 0.0 standard drinks    Comment: 1/2 glass wine nightly    ALLERGIES:  Allergies  Allergen Reactions  . Bisphosphonates Other (See Comments)    Dysphagia to oral bisphosphonates  . Codeine   . Statins Other (See Comments)    Muscle aches  . Sulfa Antibiotics     Can't recall reaction  . Procaine Hcl Palpitations     PERTINENT MEDICATIONS:  Outpatient Encounter Medications as of 05/07/2020  Medication Sig  . calcium carbonate (TUMS - DOSED IN MG ELEMENTAL CALCIUM) 500 MG chewable tablet Chew 1 tablet by mouth 2 (two) times daily with a meal.  . cholecalciferol (VITAMIN D) 1000 UNITS tablet Take 1,000 Units by mouth daily.  . diphenhydramine-acetaminophen (TYLENOL PM) 25-500 MG TABS Take 2 tablets by mouth at bedtime as needed (pain and sleep).   . feeding supplement, ENSURE ENLIVE, (ENSURE ENLIVE) LIQD Take 237 mLs by mouth 2 (two) times daily between meals. (Patient not taking: Reported on 04/03/2020)  . losartan (COZAAR) 25 MG tablet Take 1 tablet (25 mg total) by mouth daily.  . Multiple Vitamin (MULTIVITAMIN WITH MINERALS) TABS tablet Take 1 tablet by mouth daily.  . nepafenac (ILEVRO) 0.3 % ophthalmic suspension Place 1 drop into the left eye daily.   . ondansetron (ZOFRAN) 4 MG tablet Take 1 tablet (4 mg total) by mouth every 6 (six) hours as needed for nausea. (Patient not taking: Reported on 03/06/2020)  . prednisoLONE acetate (PRED MILD) 0.12 % ophthalmic suspension Place 1 drop into the left eye daily.    No facility-administered encounter medications on file as of 05/07/2020.     Keean Wilmeth Jenetta Downer, NP

## 2020-05-20 DIAGNOSIS — E441 Mild protein-calorie malnutrition: Secondary | ICD-10-CM | POA: Diagnosis not present

## 2020-05-20 DIAGNOSIS — K811 Chronic cholecystitis: Secondary | ICD-10-CM | POA: Diagnosis not present

## 2020-05-20 DIAGNOSIS — I10 Essential (primary) hypertension: Secondary | ICD-10-CM | POA: Diagnosis not present

## 2020-05-20 DIAGNOSIS — M159 Polyosteoarthritis, unspecified: Secondary | ICD-10-CM | POA: Diagnosis not present

## 2020-07-02 ENCOUNTER — Ambulatory Visit (INDEPENDENT_AMBULATORY_CARE_PROVIDER_SITE_OTHER): Payer: Medicare Other | Admitting: Dermatology

## 2020-07-02 ENCOUNTER — Encounter: Payer: Self-pay | Admitting: Dermatology

## 2020-07-02 ENCOUNTER — Other Ambulatory Visit: Payer: Self-pay

## 2020-07-02 DIAGNOSIS — L570 Actinic keratosis: Secondary | ICD-10-CM

## 2020-07-02 DIAGNOSIS — Z85820 Personal history of malignant melanoma of skin: Secondary | ICD-10-CM

## 2020-07-02 DIAGNOSIS — Z1283 Encounter for screening for malignant neoplasm of skin: Secondary | ICD-10-CM | POA: Diagnosis not present

## 2020-07-02 DIAGNOSIS — L578 Other skin changes due to chronic exposure to nonionizing radiation: Secondary | ICD-10-CM

## 2020-07-02 DIAGNOSIS — L814 Other melanin hyperpigmentation: Secondary | ICD-10-CM

## 2020-07-02 DIAGNOSIS — L820 Inflamed seborrheic keratosis: Secondary | ICD-10-CM

## 2020-07-02 DIAGNOSIS — D229 Melanocytic nevi, unspecified: Secondary | ICD-10-CM

## 2020-07-02 DIAGNOSIS — Z872 Personal history of diseases of the skin and subcutaneous tissue: Secondary | ICD-10-CM

## 2020-07-02 DIAGNOSIS — Z85828 Personal history of other malignant neoplasm of skin: Secondary | ICD-10-CM | POA: Diagnosis not present

## 2020-07-02 DIAGNOSIS — L821 Other seborrheic keratosis: Secondary | ICD-10-CM

## 2020-07-02 DIAGNOSIS — D1800 Hemangioma unspecified site: Secondary | ICD-10-CM

## 2020-07-02 DIAGNOSIS — Z86018 Personal history of other benign neoplasm: Secondary | ICD-10-CM

## 2020-07-02 NOTE — Progress Notes (Signed)
Follow-Up Visit   Subjective  Teresa Meyer is a 87 y.o. female who presents for the following: TBSE (6 mo total body exam. Hx of multiple melanomas, BCC and multiple SCC. Pt has a place of concern on her lower right leg today. ). It is irritated.  Patient here for full body skin exam and skin cancer screening.  The following portions of the chart were reviewed this encounter and updated as appropriate:      Review of Systems: No other skin or systemic complaints except as noted in HPI or Assessment and Plan.   Objective  Well appearing patient in no apparent distress; mood and affect are within normal limits.  A full examination was performed including scalp, head, eyes, ears, nose, lips, neck, chest, axillae, abdomen, back, buttocks, bilateral upper extremities, bilateral lower extremities, hands, feet, fingers, toes, fingernails, and toenails. All findings within normal limits unless otherwise noted below.  Objective  L glabella x 1, R mandable x 2, L cheek x 3, R malar cheek x 2, R forearm x 4, L forearm x 2, L medial lower leg x 1 (15): Erythematous thin papules/macules with gritty scale.   Objective  Right Medial Lower Leg x 1: Erythematous keratotic or waxy stuck-on papule or plaque.   Assessment & Plan  AK (actinic keratosis) (15) L glabella x 1, R mandable x 2, L cheek x 3, R malar cheek x 2, R forearm x 4, L forearm x 2, L medial lower leg x 1  Hypertrophic AK on right forearm and left medial lower leg  Prior to procedure, discussed risks of blister formation, small wound, skin dyspigmentation, or rare scar following cryotherapy.    Destruction of lesion - L glabella x 1, R mandable x 2, L cheek x 3, R malar cheek x 2, R forearm x 4, L forearm x 2, L medial lower leg x 1  Destruction method: cryotherapy   Informed consent: discussed and consent obtained   Lesion destroyed using liquid nitrogen: Yes   Region frozen until ice ball extended beyond lesion: Yes    Outcome: patient tolerated procedure well with no complications   Post-procedure details: wound care instructions given    Inflamed seborrheic keratosis Right Medial Lower Leg x 1  Prior to procedure, discussed risks of blister formation, small wound, skin dyspigmentation, or rare scar following cryotherapy.    Destruction of lesion - Right Medial Lower Leg x 1  Destruction method: cryotherapy   Informed consent: discussed and consent obtained   Lesion destroyed using liquid nitrogen: Yes   Region frozen until ice ball extended beyond lesion: Yes   Outcome: patient tolerated procedure well with no complications   Post-procedure details: wound care instructions given    Lentigines - Scattered tan macules - Discussed due to sun exposure - Benign, observe - Call for any changes  Seborrheic Keratoses - Stuck-on, waxy, tan-brown papules and plaques  - Discussed benign etiology and prognosis. - Observe - Call for any changes  Melanocytic Nevi - Tan-brown and/or pink-flesh-colored symmetric macules and papules - Benign appearing on exam today - Observation - Call clinic for new or changing moles - Recommend daily use of broad spectrum spf 30+ sunscreen to sun-exposed areas.   Hemangiomas - Red papules - Discussed benign nature - Observe - Call for any changes  Actinic Damage - Chronic, secondary to cumulative UV/sun exposure - diffuse scaly erythematous macules with underlying dyspigmentation - Recommend daily broad spectrum sunscreen SPF 30+ to sun-exposed areas,  reapply every 2 hours as needed.  - Call for new or changing lesions.  History of PreCancerous Actinic Keratosis  - site(s) of PreCancerous Actinic Keratosis clear today. L hand dorsum - these may recur and new lesions may form requiring treatment to prevent transformation into skin cancer - observe for new or changing spots and contact [place] for appointment if occur - photoprotection with sun  protective clothing; sunglasses and broad spectrum sunscreen with SPF of at least 30 + and frequent self skin exams recommended - yearly exams by a dermatologist recommended for persons with history of PreCancerous Actinic Keratoses  Skin cancer screening performed today.  History of Melanoma Right lateral deltoid (2021), Right upper back (2017), Right chin (2015) - No evidence of recurrence today - Recommend regular full body skin exams - Recommend daily broad spectrum sunscreen SPF 30+ to sun-exposed areas, reapply every 2 hours as needed.  - Call if any new or changing lesions are noted between office visits  History of Squamous Cell Carcinoma of the Skin Left medial leg below knee (2020), Right pretibial lateral (2018) - No evidence of recurrence today - Recommend regular full body skin exams - Recommend daily broad spectrum sunscreen SPF 30+ to sun-exposed areas, reapply every 2 hours as needed.  - Call if any new or changing lesions are noted between office visits  History of Basal Cell Carcinoma of the Skin Right distal dorsum nose (2019) - No evidence of recurrence today - Recommend regular full body skin exams - Recommend daily broad spectrum sunscreen SPF 30+ to sun-exposed areas, reapply every 2 hours as needed.  - Call if any new or changing lesions are noted between office visits  History of Dysplastic Nevi - No evidence of recurrence today - Recommend regular full body skin exams - Recommend daily broad spectrum sunscreen SPF 30+ to sun-exposed areas, reapply every 2 hours as needed.  - Call if any new or changing lesions are noted between office visits   Return in about 6 months (around 12/30/2020) for TBSE.   I, Harriett Sine, CMA, am acting as scribe for Brendolyn Patty, MD.  Documentation: I have reviewed the above documentation for accuracy and completeness, and I agree with the above.  Brendolyn Patty MD

## 2020-07-02 NOTE — Patient Instructions (Addendum)
Cryotherapy Aftercare  . Wash gently with soap and water everyday.   Marland Kitchen Apply Vaseline and Band-Aid daily until healed.   Recommend OTC Curad Mediplast Patches for the callous on bottom of right foot if desired.    Recommend OTC Gold Bond Rough and Bumpy for all over the body.

## 2020-07-16 DIAGNOSIS — M1990 Unspecified osteoarthritis, unspecified site: Secondary | ICD-10-CM | POA: Diagnosis not present

## 2020-07-16 DIAGNOSIS — K219 Gastro-esophageal reflux disease without esophagitis: Secondary | ICD-10-CM | POA: Diagnosis not present

## 2020-07-16 DIAGNOSIS — I10 Essential (primary) hypertension: Secondary | ICD-10-CM | POA: Diagnosis not present

## 2020-07-16 DIAGNOSIS — E43 Unspecified severe protein-calorie malnutrition: Secondary | ICD-10-CM | POA: Diagnosis not present

## 2020-07-16 DIAGNOSIS — K811 Chronic cholecystitis: Secondary | ICD-10-CM

## 2020-07-30 ENCOUNTER — Other Ambulatory Visit: Payer: Self-pay

## 2020-07-30 ENCOUNTER — Non-Acute Institutional Stay: Payer: Medicare Other | Admitting: Adult Health Nurse Practitioner

## 2020-07-30 DIAGNOSIS — K82A1 Gangrene of gallbladder in cholecystitis: Secondary | ICD-10-CM

## 2020-07-30 DIAGNOSIS — Z515 Encounter for palliative care: Secondary | ICD-10-CM

## 2020-07-30 DIAGNOSIS — F339 Major depressive disorder, recurrent, unspecified: Secondary | ICD-10-CM

## 2020-07-30 NOTE — Progress Notes (Signed)
[facility] Consult Note Telephone: [phone]  Fax: [phone]  PATIENT NAME: Teresa Meyer DOB: 1922-06-27 MRN: [id number]  PRIMARY CARE PROVIDER:   Dr. Colin Broach PROVIDER:  Dr. Silvio Pate  RESPONSIBLE PARTY:   Teresa Meyer, daughter/HCPOA [phone]  Chief complaint: follow up palliative visit/depression   RECOMMENDATIONS and PLAN: 1.Advanced care planning. Patient is a DNR.  Called daughter to update on visit.  Left VM with reason for call and contact info  2.  Cholecystitis with gangrene of gallbladder.  Drain with clear yellow liquid noted.  No complaints of pain.  Continue care of drain at facility  3.  Depression.  Patient not having signs/symptoms of depression today.  She now recognizes and accepts the facility as her home.  Continue supportive care at facility  Palliative will continue to monitor for symptom management/decline and make recommendations as needed.  Will follow up in 8-10 weeks.   I spent 35 minutes providing this consultation, including time spent with patient, provider coordination, chart review, documentation. More than 50% of the time in this consultation was spent coordinating communication.   HISTORY OF PRESENT ILLNESS:  Teresa Meyer is a 87 y.o. year old female with multiple medical problems including osteoporosis, OA, gangrene cholecystitis, HLD, venous insufficiency, HTN, h/o melanoma. Palliative Care was asked to help address goals of care. Patient has no new concerns today.  States that she did have a fall 2 months ago in which she fell on her bottom.  Had pain at that time but denies any pain today.  States that she has "fleeting" depression which distraction on something she enjoys makes it go away.  Rest of 10 point ROS asked and negative.  She has not had any infection or hospital visits since last visit.    CODE STATUS: DNR  PPS: 50% HOSPICE ELIGIBILITY/DIAGNOSIS:  TBD  PHYSICAL EXAM:  HR 67 O2 99% on RA General: NAD, frail appearing, thin Eyes: sclera anicteric and noninjected with no discharge noted ENMT: moist mucous membranes Cardiovascular: regular rate and rhythm Pulmonary: lung sounds clear; normal respiratory effort Abdomen: soft, nontender, + bowel sounds Extremities: trace edema to bilateral lower extremities, no joint deformities Skin: no rashes on exposed skin Neurological: Weakness but otherwise nonfocal; has forgetfulness and short term memory loss  PAST MEDICAL HISTORY:  Past Medical History:  Diagnosis Date  . Arthritis   . Atypical nevi 09/2014   lentiginous dysplastic nevus with mod atypia ([place])  . Basal cell carcinoma 08/12/2017   R distal dorsum nose   . Diverticulitis   . DNR (do not resuscitate)   . Esophageal stricture 2012  . GERD (gastroesophageal reflux disease)   . HLD (hyperlipidemia)    did not tolerate statin, stopped checking  . HTN (hypertension)   . Leg wound, left, subsequent encounter 11/12/2017  . Malignant melanoma ([place]) 2015, 2017   R chin s/p excision ([place]) R upper back [name])  . Malignant melanoma ([place]) 07/27/19 WLE at [facility]   R lat deltoid  . Osteoporosis, post-menopausal    bisphosphonate caused dysphagia, requests defer DEXA for now  . Seasonal allergies   . Squamous cell carcinoma of skin 08/21/2018   L med leg below knee  . Squamous cell carcinoma of skin 01/20/2017   R pretibial lateral  . Wears hearing aid     SOCIAL HX:  Social History   Tobacco Use  . Smoking status: Never Smoker  . Smokeless tobacco: Never Used  Substance  Use Topics  . Alcohol use: Yes    Alcohol/week: 0.0 standard drinks    Comment: 1/2 glass wine nightly    ALLERGIES:  Allergies  Allergen Reactions  . Bisphosphonates Other (See Comments)    Dysphagia to oral bisphosphonates  . Codeine   . Statins Other (See Comments)    Muscle aches  . Sulfa Antibiotics     Can't recall reaction  . Procaine  Hcl Palpitations     PERTINENT MEDICATIONS:  Outpatient Encounter Medications as of 07/30/2020  Medication Sig  . calcium carbonate (TUMS - DOSED IN MG ELEMENTAL CALCIUM) 500 MG chewable tablet Chew 1 tablet by mouth 2 (two) times daily with a meal.  . cholecalciferol (VITAMIN D) 1000 UNITS tablet Take 1,000 Units by mouth daily.  . diphenhydramine-acetaminophen (TYLENOL PM) 25-500 MG TABS Take 2 tablets by mouth at bedtime as needed (pain and sleep).   . feeding supplement, ENSURE ENLIVE, (ENSURE ENLIVE) LIQD Take 237 mLs by mouth 2 (two) times daily between meals. (Patient not taking: Reported on 04/03/2020)  . losartan (COZAAR) 25 MG tablet Take 1 tablet (25 mg total) by mouth daily.  . Multiple Vitamin (MULTIVITAMIN WITH MINERALS) TABS tablet Take 1 tablet by mouth daily.  . nepafenac (ILEVRO) 0.3 % ophthalmic suspension Place 1 drop into the left eye daily.   . ondansetron (ZOFRAN) 4 MG tablet Take 1 tablet (4 mg total) by mouth every 6 (six) hours as needed for nausea. (Patient not taking: Reported on 03/06/2020)  . prednisoLONE acetate (PRED MILD) 0.12 % ophthalmic suspension Place 1 drop into the left eye daily.    No facility-administered encounter medications on file as of 07/30/2020.     Teresa Meyer Teresa Downer, NP

## 2020-09-16 DIAGNOSIS — Z934 Other artificial openings of gastrointestinal tract status: Secondary | ICD-10-CM | POA: Diagnosis not present

## 2020-09-16 DIAGNOSIS — E441 Mild protein-calorie malnutrition: Secondary | ICD-10-CM | POA: Diagnosis not present

## 2020-09-16 DIAGNOSIS — I10 Essential (primary) hypertension: Secondary | ICD-10-CM | POA: Diagnosis not present

## 2020-09-16 DIAGNOSIS — K811 Chronic cholecystitis: Secondary | ICD-10-CM | POA: Diagnosis not present

## 2020-09-16 DIAGNOSIS — M159 Polyosteoarthritis, unspecified: Secondary | ICD-10-CM

## 2020-09-25 ENCOUNTER — Other Ambulatory Visit: Payer: Self-pay

## 2020-09-25 ENCOUNTER — Encounter: Payer: Self-pay | Admitting: Dermatology

## 2020-09-25 ENCOUNTER — Ambulatory Visit (INDEPENDENT_AMBULATORY_CARE_PROVIDER_SITE_OTHER): Payer: Medicare Other | Admitting: Dermatology

## 2020-09-25 DIAGNOSIS — C44729 Squamous cell carcinoma of skin of left lower limb, including hip: Secondary | ICD-10-CM

## 2020-09-25 DIAGNOSIS — Z85820 Personal history of malignant melanoma of skin: Secondary | ICD-10-CM | POA: Diagnosis not present

## 2020-09-25 DIAGNOSIS — Z1283 Encounter for screening for malignant neoplasm of skin: Secondary | ICD-10-CM | POA: Diagnosis not present

## 2020-09-25 DIAGNOSIS — Z86018 Personal history of other benign neoplasm: Secondary | ICD-10-CM | POA: Diagnosis not present

## 2020-09-25 DIAGNOSIS — D485 Neoplasm of uncertain behavior of skin: Secondary | ICD-10-CM

## 2020-09-25 DIAGNOSIS — Z85828 Personal history of other malignant neoplasm of skin: Secondary | ICD-10-CM

## 2020-09-25 DIAGNOSIS — D1800 Hemangioma unspecified site: Secondary | ICD-10-CM

## 2020-09-25 DIAGNOSIS — L814 Other melanin hyperpigmentation: Secondary | ICD-10-CM

## 2020-09-25 DIAGNOSIS — L578 Other skin changes due to chronic exposure to nonionizing radiation: Secondary | ICD-10-CM

## 2020-09-25 DIAGNOSIS — D229 Melanocytic nevi, unspecified: Secondary | ICD-10-CM

## 2020-09-25 DIAGNOSIS — L570 Actinic keratosis: Secondary | ICD-10-CM | POA: Diagnosis not present

## 2020-09-25 DIAGNOSIS — L820 Inflamed seborrheic keratosis: Secondary | ICD-10-CM | POA: Diagnosis not present

## 2020-09-25 DIAGNOSIS — L821 Other seborrheic keratosis: Secondary | ICD-10-CM

## 2020-09-25 NOTE — Patient Instructions (Addendum)

## 2020-09-25 NOTE — Progress Notes (Signed)
Follow-Up Visit   Subjective  Teresa Meyer is a 87 y.o. female who presents for the following: TBSE (Pt would like checked all over today. Hx of multiple melanomas, BCC, and multiple SCC, and dysplastic nevi. Pt has a new spot on her left lower leg that she is worried about today. ). Patient here for full body skin exam and skin cancer screening.  The following portions of the chart were reviewed this encounter and updated as appropriate:  Tobacco  Allergies  Meds  Problems  Med Hx  Surg Hx  Fam Hx     Review of Systems: No other skin or systemic complaints except as noted in HPI or Assessment and Plan.  Objective  Well appearing patient in no apparent distress; mood and affect are within normal limits.  A full examination was performed including scalp, head, eyes, ears, nose, lips, neck, chest, axillae, abdomen, back, buttocks, bilateral upper extremities, bilateral lower extremities, hands, feet, fingers, toes, fingernails, and toenails. All findings within normal limits unless otherwise noted below.  Objective  Left medial lower leg above ankle: R/o scc 1.8 cm hyperkeratotic papule  Objective  face x 4, legs x 6 (10): Erythematous thin papules/macules with gritty scale.   Objective  right back x 1: Erythematous keratotic or waxy stuck-on papule or plaque.   Assessment & Plan  Neoplasm of uncertain behavior of skin Left medial lower leg above ankle  Epidermal / dermal shaving  Lesion diameter (cm):  1.8 Informed consent: discussed and consent obtained   Timeout: patient name, date of birth, surgical site, and procedure verified   Procedure prep:  Patient was prepped and draped in usual sterile fashion Prep type:  Isopropyl alcohol Anesthesia: the lesion was anesthetized in a standard fashion   Anesthetic:  1% lidocaine w/ epinephrine 1-100,000 buffered w/ 8.4% NaHCO3 Instrument used: flexible razor blade   Hemostasis achieved with: pressure, aluminum chloride  and electrodesiccation   Outcome: patient tolerated procedure well   Post-procedure details: sterile dressing applied and wound care instructions given   Dressing type: bandage and petrolatum    Destruction of lesion Complexity: extensive   Destruction method: electrodesiccation and curettage   Informed consent: discussed and consent obtained   Timeout:  patient name, date of birth, surgical site, and procedure verified Procedure prep:  Patient was prepped and draped in usual sterile fashion Prep type:  Isopropyl alcohol Anesthesia: the lesion was anesthetized in a standard fashion   Anesthetic:  1% lidocaine w/ epinephrine 1-100,000 buffered w/ 8.4% NaHCO3 Curettage performed in three different directions: Yes   Electrodesiccation performed over the curetted area: Yes   Lesion length (cm):  1.8 Lesion width (cm):  1.8 Margin per side (cm):  0.2 Final wound size (cm):  2.2 Hemostasis achieved with:  pressure and aluminum chloride Outcome: patient tolerated procedure well with no complications   Post-procedure details: sterile dressing applied and wound care instructions given   Dressing type: bandage and petrolatum    Specimen 1 - Surgical pathology Differential Diagnosis: r/o SCC  Check Margins: Yes Left medial lower leg above ankle 1.8 cm hyperkeratotic papule  AK (actinic keratosis) (10) face x 4, legs x 6  Actinic keratoses are precancerous spots that appear secondary to cumulative UV radiation exposure/sun exposure over time. They are chronic with expected duration over 1 year. A portion of actinic keratoses will progress to squamous cell carcinoma of the skin. It is not possible to reliably predict which spots will progress to skin cancer and  so treatment is recommended to prevent development of skin cancer.  Recommend daily broad spectrum sunscreen SPF 30+ to sun-exposed areas, reapply every 2 hours as needed.  Recommend staying in the shade or wearing long sleeves, sun  glasses (UVA+UVB protection) and wide brim hats (4-inch brim around the entire circumference of the hat). Call for new or changing lesions.   Prior to procedure, discussed risks of blister formation, small wound, skin dyspigmentation, or rare scar following cryotherapy.    Destruction of lesion - face x 4, legs x 6 Complexity: simple   Destruction method: cryotherapy   Informed consent: discussed and consent obtained   Timeout:  patient name, date of birth, surgical site, and procedure verified Lesion destroyed using liquid nitrogen: Yes   Region frozen until ice ball extended beyond lesion: Yes   Outcome: patient tolerated procedure well with no complications   Post-procedure details: wound care instructions given    Inflamed seborrheic keratosis right back x 1  Prior to procedure, discussed risks of blister formation, small wound, skin dyspigmentation, or rare scar following cryotherapy.    Destruction of lesion - right back x 1 Complexity: simple   Destruction method: cryotherapy   Informed consent: discussed and consent obtained   Timeout:  patient name, date of birth, surgical site, and procedure verified Lesion destroyed using liquid nitrogen: Yes   Region frozen until ice ball extended beyond lesion: Yes   Outcome: patient tolerated procedure well with no complications   Post-procedure details: wound care instructions given    Skin cancer screening  SCC (squamous cell carcinoma), leg, left   Lentigines - Scattered tan macules - Due to sun exposure - Benign-appering, observe - Recommend daily broad spectrum sunscreen SPF 30+ to sun-exposed areas, reapply every 2 hours as needed. - Call for any changes  Seborrheic Keratoses - Stuck-on, waxy, tan-brown papules and/or plaques  - Benign-appearing - Discussed benign etiology and prognosis. - Observe - Call for any changes  Melanocytic Nevi - Tan-brown and/or pink-flesh-colored symmetric macules and papules -  Benign appearing on exam today - Observation - Call clinic for new or changing moles - Recommend daily use of broad spectrum spf 30+ sunscreen to sun-exposed areas.   Hemangiomas - Red papules - Discussed benign nature - Observe - Call for any changes  Actinic Damage - Chronic condition, secondary to cumulative UV/sun exposure - diffuse scaly erythematous macules with underlying dyspigmentation - Recommend daily broad spectrum sunscreen SPF 30+ to sun-exposed areas, reapply every 2 hours as needed.  - Staying in the shade or wearing long sleeves, sun glasses (UVA+UVB protection) and wide brim hats (4-inch brim around the entire circumference of the hat) are also recommended for sun protection.  - Call for new or changing lesions.  Skin cancer screening performed today.  History of Melanoma - invasive - S/P surgery - No evidence of recurrence today - No lymphadenopathy - Recommend regular full body skin exams - Recommend daily broad spectrum sunscreen SPF 30+ to sun-exposed areas, reapply every 2 hours as needed.  - Call if any new or changing lesions are noted between office visits  History of Squamous Cell Carcinoma of the Skin - No evidence of recurrence today - No lymphadenopathy - Recommend regular full body skin exams - Recommend daily broad spectrum sunscreen SPF 30+ to sun-exposed areas, reapply every 2 hours as needed.  - Call if any new or changing lesions are noted between office visits  History of Basal Cell Carcinoma of the Skin - No  evidence of recurrence today - Recommend regular full body skin exams - Recommend daily broad spectrum sunscreen SPF 30+ to sun-exposed areas, reapply every 2 hours as needed.  - Call if any new or changing lesions are noted between office visits  History of Dysplastic Nevi - No evidence of recurrence today - Recommend regular full body skin exams - Recommend daily broad spectrum sunscreen SPF 30+ to sun-exposed areas, reapply every  2 hours as needed.  - Call if any new or changing lesions are noted between office visits  Return in about 4 months (around 01/26/2021) for TBSE.   IHarriett Sine, CMA, am acting as scribe for Sarina Ser, MD.  Documentation: I have reviewed the above documentation for accuracy and completeness, and I agree with the above.  Sarina Ser, MD

## 2020-09-29 ENCOUNTER — Encounter: Payer: Self-pay | Admitting: Dermatology

## 2020-10-02 ENCOUNTER — Telehealth: Payer: Self-pay

## 2020-10-02 NOTE — Telephone Encounter (Signed)
Left message for patient to call office for results/hd 

## 2020-10-02 NOTE — Telephone Encounter (Signed)
-----   Message from Ralene Bathe, MD sent at 09/29/2020  6:41 PM EDT ----- Diagnosis Skin , left medial lower leg above ankle WELL DIFFERENTIATED SQUAMOUS CELL CARCINOMA, BASE INVOLVED  Cancer - SCC Already treated Recheck next visit

## 2020-10-03 DIAGNOSIS — F424 Excoriation (skin-picking) disorder: Secondary | ICD-10-CM | POA: Diagnosis not present

## 2020-10-03 DIAGNOSIS — C44701 Unspecified malignant neoplasm of skin of unspecified lower limb, including hip: Secondary | ICD-10-CM | POA: Diagnosis not present

## 2020-10-03 DIAGNOSIS — S81802A Unspecified open wound, left lower leg, initial encounter: Secondary | ICD-10-CM | POA: Diagnosis not present

## 2020-10-15 ENCOUNTER — Telehealth: Payer: Self-pay

## 2020-10-15 NOTE — Telephone Encounter (Signed)
Discussed biopsy results with pt daughter Jeani Hawking

## 2020-11-07 DIAGNOSIS — K219 Gastro-esophageal reflux disease without esophagitis: Secondary | ICD-10-CM | POA: Diagnosis not present

## 2020-11-07 DIAGNOSIS — I10 Essential (primary) hypertension: Secondary | ICD-10-CM | POA: Diagnosis not present

## 2020-11-07 DIAGNOSIS — M1990 Unspecified osteoarthritis, unspecified site: Secondary | ICD-10-CM | POA: Diagnosis not present

## 2020-11-07 DIAGNOSIS — K811 Chronic cholecystitis: Secondary | ICD-10-CM

## 2020-11-07 DIAGNOSIS — E43 Unspecified severe protein-calorie malnutrition: Secondary | ICD-10-CM | POA: Diagnosis not present

## 2020-11-07 DIAGNOSIS — K8065 Calculus of gallbladder and bile duct with chronic cholecystitis with obstruction: Secondary | ICD-10-CM

## 2020-12-16 ENCOUNTER — Non-Acute Institutional Stay: Payer: Medicare Other | Admitting: Student

## 2020-12-16 ENCOUNTER — Other Ambulatory Visit: Payer: Self-pay

## 2020-12-16 DIAGNOSIS — Z9359 Other cystostomy status: Secondary | ICD-10-CM

## 2020-12-16 DIAGNOSIS — K82A1 Gangrene of gallbladder in cholecystitis: Secondary | ICD-10-CM

## 2020-12-16 DIAGNOSIS — Z515 Encounter for palliative care: Secondary | ICD-10-CM

## 2020-12-16 NOTE — Progress Notes (Signed)
[facility] Consult Note Telephone: [phone]  Fax: [phone]    Date of encounter: 12/16/20 PATIENT NAME: Teresa Meyer [place] [address]   [phone] (home)  DOB: February 14, 1923 MRN: [id number] PRIMARY CARE PROVIDER:    Dr. Colin Broach PROVIDER:   [name], MD [address] [place],   [address] [phone]  RESPONSIBLE PARTY:    Contact Information     Name Relation Home Work Mobile   [place] Daughter [phone]     Marcello [place] Daughter   [phone]   [facility] [phone]          I met face to face with patient and staff in the facility. Palliative Care was asked to follow this patient by consultation request of  Dr. Silvio Pate to address advance care planning and complex medical decision making. This is a follow up visit.                                   ASSESSMENT AND PLAN / RECOMMENDATIONS:   Advance Care Planning/Goals of Care: Goals include to maximize quality of life and symptom management.  CODE STATUS: DNR  Patient expresses wanting to limit hospitalizations.   Symptom Management/Plan:  Gangrene cholecystitis-patient with drain. Some erythema to peri area; serous drainage reported by staff, not observed. Area cleansed with NS, patted dry, applied split gauze and foam dressing. Continue drain care per facility policy. Flush BID as directed.    Follow up Palliative Care Visit: Palliative care will continue to follow for complex medical decision making, advance care planning, and clarification of goals. Return in 8 weeks or prn.  I spent 35 minutes providing this consultation. More than 50% of the time in this consultation was spent in counseling and care coordination.    PPS: 50%  HOSPICE ELIGIBILITY/DIAGNOSIS: TBD  Chief Complaint: Palliative Medicine follow up visit.   HISTORY OF PRESENT ILLNESS:  Teresa Meyer is a 87  y.o. year old female  with gangrene cholecystitis, venous insufficiency, hypertension, hyperlipidemia, GERD, osteoporosis, OA.    Patient resides at [facility]. She states she has been doing well. She denies having any pain. Staff report patient serous drainage from her drainage site and redness to peri area. Denies any nausea, constipation. She endorses a fair appetite; states she eats a good breakfast and lunch, does not usually eat much at dinner. She uses walker for ambulation. Denies any recent falls. She is sleeping well at night. Patient endorses a good mood; she has adjusted to being in SNF.    History obtained from review of EMR, discussion with primary team, and interview with family, facility staff/caregiver and/or Ms. Juarez.  I reviewed available labs, medications, imaging, studies and related documents from the EMR.  Records reviewed and summarized above.   ROS  General: NAD EYES: denies vision changes ENMT: denies dysphagia Cardiovascular: denies chest pain Pulmonary: denies cough, denies SOB Abdomen: denies constipation, endorses continence of bowel GU: denies dysuria MSK:  denies weakness Skin: denies rashes Neurological: denies pain, denies insomnia Psych: Endorses stable mood Heme/lymph/immuno: denies bruises, abnormal bleeding  Physical Exam: Weight: 114.2 pounds Pulse 76, resp 16, sats 99% on room air Constitutional: NAD General: frail appearing, thin EYES: anicteric sclera, lids intact, no discharge  ENMT: hard of hearing, oral mucous membranes moist, CV: S1S2, RRR, trace LE edema Pulmonary: LCTA, no increased work  of breathing, no cough Abdomen: normo-active BS + 4 quadrants, soft and non tender,  GU: deferred MSK:  moves all extremities, ambulatory Skin: cool and dry, mild erythema to peri area of gall bladder drain, no drainage observed Neuro: generalized weakness, A & O x 3 Psych: non-anxious affect, pleasant Hem/lymph/immuno: no  widespread bruising   Thank you for the opportunity to participate in the care of Teresa Meyer.  The palliative care team will continue to follow. Please call our office at [phone] if we can be of additional assistance.   Ezekiel Slocumb, NP   COVID-19 PATIENT SCREENING TOOL Asked and negative response unless otherwise noted:   Have you had symptoms of covid, tested positive or been in contact with someone with symptoms/positive test in the past 5-10 days? No

## 2020-12-23 DIAGNOSIS — T85590A Other mechanical complication of bile duct prosthesis, initial encounter: Secondary | ICD-10-CM | POA: Diagnosis not present

## 2020-12-24 ENCOUNTER — Encounter: Payer: Self-pay | Admitting: Hematology and Oncology

## 2020-12-31 ENCOUNTER — Ambulatory Visit (INDEPENDENT_AMBULATORY_CARE_PROVIDER_SITE_OTHER): Payer: Medicare Other | Admitting: Dermatology

## 2020-12-31 ENCOUNTER — Other Ambulatory Visit: Payer: Self-pay

## 2020-12-31 DIAGNOSIS — Z1283 Encounter for screening for malignant neoplasm of skin: Secondary | ICD-10-CM | POA: Diagnosis not present

## 2020-12-31 DIAGNOSIS — L570 Actinic keratosis: Secondary | ICD-10-CM | POA: Diagnosis not present

## 2020-12-31 DIAGNOSIS — L820 Inflamed seborrheic keratosis: Secondary | ICD-10-CM | POA: Diagnosis not present

## 2020-12-31 DIAGNOSIS — Z85828 Personal history of other malignant neoplasm of skin: Secondary | ICD-10-CM

## 2020-12-31 DIAGNOSIS — Z85820 Personal history of malignant melanoma of skin: Secondary | ICD-10-CM

## 2020-12-31 DIAGNOSIS — S81812A Laceration without foreign body, left lower leg, initial encounter: Secondary | ICD-10-CM

## 2020-12-31 DIAGNOSIS — T07XXXA Unspecified multiple injuries, initial encounter: Secondary | ICD-10-CM

## 2020-12-31 DIAGNOSIS — L720 Epidermal cyst: Secondary | ICD-10-CM

## 2020-12-31 NOTE — Patient Instructions (Signed)

## 2020-12-31 NOTE — Progress Notes (Signed)
Follow-Up Visit   Subjective  Teresa Meyer is a 87 y.o. female who presents for the following: Other (History of melanoma, BCC, SCC - TBSE today/). The patient presents for Total-Body Skin Exam (TBSE) for skin cancer screening and mole check.  Accompanied by caregiver.  The patient presents for Total-Body Skin Exam (TBSE) for skin cancer screening and mole check.  The following portions of the chart were reviewed this encounter and updated as appropriate:   Tobacco  Allergies  Meds  Problems  Med Hx  Surg Hx  Fam Hx     Review of Systems:  No other skin or systemic complaints except as noted in HPI or Assessment and Plan.  Objective  Well appearing patient in no apparent distress; mood and affect are within normal limits.  A full examination was performed including scalp, head, eyes, ears, nose, lips, neck, chest, axillae, abdomen, back, buttocks, bilateral upper extremities, bilateral lower extremities, hands, feet, fingers, toes, fingernails, and toenails. All findings within normal limits unless otherwise noted below.  Neck - Anterior 1.0 cm Subcutaneous nodule.   Left nose x 1, right forehead x 1 (2) Erythematous thin papules/macules with gritty scale.   Left mid back Erythematous keratotic or waxy stuck-on papule or plaque.   Legs Crusts  Left calf Skin tear   Assessment & Plan   Lentigines - Scattered tan macules - Due to sun exposure - Benign-appering, observe - Recommend daily broad spectrum sunscreen SPF 30+ to sun-exposed areas, reapply every 2 hours as needed. - Call for any changes  Seborrheic Keratoses - Stuck-on, waxy, tan-brown papules and/or plaques  - Benign-appearing - Discussed benign etiology and prognosis. - Observe - Call for any changes  Melanocytic Nevi - Tan-brown and/or pink-flesh-colored symmetric macules and papules - Benign appearing on exam today - Observation - Call clinic for new or changing moles - Recommend daily  use of broad spectrum spf 30+ sunscreen to sun-exposed areas.   Hemangiomas - Red papules - Discussed benign nature - Observe - Call for any changes  Actinic Damage - Chronic condition, secondary to cumulative UV/sun exposure - diffuse scaly erythematous macules with underlying dyspigmentation - Recommend daily broad spectrum sunscreen SPF 30+ to sun-exposed areas, reapply every 2 hours as needed.  - Staying in the shade or wearing long sleeves, sun glasses (UVA+UVB protection) and wide brim hats (4-inch brim around the entire circumference of the hat) are also recommended for sun protection.  - Call for new or changing lesions.  Skin cancer screening performed today.  Epidermal inclusion cyst Neck - Anterior  Benign  AK (actinic keratosis) (2) Left nose x 1, right forehead x 1  Destruction of lesion - Left nose x 1, right forehead x 1 Complexity: simple   Destruction method: cryotherapy   Informed consent: discussed and consent obtained   Timeout:  patient name, date of birth, surgical site, and procedure verified Lesion destroyed using liquid nitrogen: Yes   Region frozen until ice ball extended beyond lesion: Yes   Outcome: patient tolerated procedure well with no complications   Post-procedure details: wound care instructions given    Inflamed seborrheic keratosis Left mid back  Destruction of lesion - Left mid back Complexity: simple   Destruction method: cryotherapy   Informed consent: discussed and consent obtained   Timeout:  patient name, date of birth, surgical site, and procedure verified Lesion destroyed using liquid nitrogen: Yes   Region frozen until ice ball extended beyond lesion: Yes   Outcome: patient  tolerated procedure well with no complications   Post-procedure details: wound care instructions given    Multiple excoriations Legs Skin tear of left lower leg without complication, initial encounter Left calf Mupirocin, telfa and coban applied  today. Advised patient area will heal over time.  Skin cancer screening  Return in about 4 months (around 05/02/2021) for TBSE.  I, Ashok Cordia, CMA, am acting as scribe for Sarina Ser, MD . Documentation: I have reviewed the above documentation for accuracy and completeness, and I agree with the above.  Sarina Ser, MD

## 2021-01-06 ENCOUNTER — Encounter: Payer: Self-pay | Admitting: Dermatology

## 2021-01-15 DIAGNOSIS — T85590A Other mechanical complication of bile duct prosthesis, initial encounter: Secondary | ICD-10-CM | POA: Diagnosis not present

## 2021-01-15 DIAGNOSIS — M159 Polyosteoarthritis, unspecified: Secondary | ICD-10-CM | POA: Diagnosis not present

## 2021-01-15 DIAGNOSIS — E441 Mild protein-calorie malnutrition: Secondary | ICD-10-CM | POA: Diagnosis not present

## 2021-01-15 DIAGNOSIS — K811 Chronic cholecystitis: Secondary | ICD-10-CM | POA: Diagnosis not present

## 2021-01-16 ENCOUNTER — Other Ambulatory Visit: Payer: Self-pay | Admitting: Internal Medicine

## 2021-01-16 DIAGNOSIS — K819 Cholecystitis, unspecified: Secondary | ICD-10-CM

## 2021-01-19 ENCOUNTER — Other Ambulatory Visit: Payer: Self-pay

## 2021-01-19 ENCOUNTER — Ambulatory Visit
Admission: RE | Admit: 2021-01-19 | Discharge: 2021-01-19 | Disposition: A | Discharge status: Home / Self Care | Payer: Medicare Other | Source: Ambulatory Visit | Attending: Internal Medicine | Admitting: Internal Medicine

## 2021-01-19 DIAGNOSIS — K819 Cholecystitis, unspecified: Secondary | ICD-10-CM | POA: Insufficient documentation

## 2021-01-19 DIAGNOSIS — Z434 Encounter for attention to other artificial openings of digestive tract: Secondary | ICD-10-CM | POA: Insufficient documentation

## 2021-01-19 HISTORY — PX: IR CHOLANGIOGRAM EXISTING TUBE: IMG6040

## 2021-01-19 MED ORDER — IOHEXOL 300 MG/ML  SOLN
20.0000 mL | Freq: Once | INTRAMUSCULAR | Status: AC | PRN
  Administered 2021-01-19: 20 mL
  Filled 2021-01-19: qty 20

## 2021-01-19 MED ORDER — LIDOCAINE 1% INJECTION FOR CIRCUMCISION
INJECTION | INTRAVENOUS | Status: DC | PRN
  Administered 2021-01-19: 10 mL via SUBCUTANEOUS

## 2021-01-22 ENCOUNTER — Other Ambulatory Visit: Payer: Self-pay | Admitting: Interventional Radiology

## 2021-01-22 ENCOUNTER — Encounter: Payer: Self-pay | Admitting: *Deleted

## 2021-01-22 ENCOUNTER — Ambulatory Visit
Admission: RE | Admit: 2021-01-22 | Discharge: 2021-01-22 | Disposition: A | Discharge status: Home / Self Care | Payer: Medicare Other | Source: Ambulatory Visit | Attending: Interventional Radiology | Admitting: Interventional Radiology

## 2021-01-22 ENCOUNTER — Other Ambulatory Visit: Payer: Self-pay | Admitting: General Surgery

## 2021-01-22 DIAGNOSIS — K82A1 Gangrene of gallbladder in cholecystitis: Secondary | ICD-10-CM

## 2021-01-22 DIAGNOSIS — K810 Acute cholecystitis: Secondary | ICD-10-CM

## 2021-01-22 HISTORY — PX: IR RADIOLOGIST EVAL & MGMT: IMG5224

## 2021-01-22 NOTE — Consult Note (Signed)
Chief Complaint: Patient was seen in consultation today for cholelithiasis \ Patient Status: [facility] - Out-pt  History of Present Illness: Teresa Meyer is a 87 y.o. female with history of acute calculous cholecystitis status post percutaneous cholecystostomy tube placement on 02/12/20.  She underwent cholecystotomy tube exchange on 04/04/2020 then was lost to follow up and returned for cholecystotomy tube check and exchange on 01/19/21.  At this time, she was noted to have residual cholelithiasis as well as choledocholithiasis. She has been seen by Dr. Celine Ahr (General Surgery) who deemed her a poor surgical candidate for cholecystectomy.    I described percutaneous gallstone retrieval with Spyglass visualization to the patient and her daughter at her most recent appointment.  She and her daughter expressed some interest but wanted to discuss the procedure.  Today, her daughter/POA Jerlyn Ly, calls to notify me that her mother wishes to proceed with gallstone retrieval and hopeful eventual tube removal.    Past Medical History:  Diagnosis Date   Arthritis    Atypical nevi 09/2014   lentiginous dysplastic nevus with mod atypia ([place])   Basal cell carcinoma 08/12/2017   R distal dorsum nose    Diverticulitis    DNR (do not resuscitate)    Esophageal stricture 2012   GERD (gastroesophageal reflux disease)    HLD (hyperlipidemia)    did not tolerate statin, stopped checking   HTN (hypertension)    Leg wound, left, subsequent encounter 11/12/2017   Malignant melanoma ([place]) 2015, 2017   R chin s/p excision ([place]) R upper back [name])   Malignant melanoma ([place]) 07/27/19 WLE at [facility]   R lat deltoid   Osteoporosis, post-menopausal    bisphosphonate caused dysphagia, requests defer DEXA for now   Seasonal allergies    Squamous cell carcinoma of skin 08/21/2018   L med leg below knee   Squamous cell carcinoma of skin 01/20/2017   R pretibial lateral   Squamous cell carcinoma of skin  09/25/2020   Left med lower leg above ankle - [facility]   Wears hearing aid     Past Surgical History:  Procedure Laterality Date   BUNIONECTOMY Right 2001   CARPAL TUNNEL RELEASE Bilateral 1974   CATARACT EXTRACTION Bilateral    [place]   IR CHOLANGIOGRAM EXISTING TUBE  04/04/2020   IR CHOLANGIOGRAM EXISTING TUBE  01/19/2021   TONSILLECTOMY  1928    Allergies: Bisphosphonates, Codeine, Statins, Sulfa antibiotics, and Procaine hcl  Medications: Prior to Admission medications   Medication Sig Start Date End Date Taking? Authorizing Provider  calcium carbonate (TUMS - DOSED IN MG ELEMENTAL CALCIUM) 500 MG chewable tablet Chew 1 tablet by mouth 2 (two) times daily with a meal.    [provider]  cholecalciferol (VITAMIN D) 1000 UNITS tablet Take 1,000 Units by mouth daily.    [provider]  diphenhydramine-acetaminophen (TYLENOL PM) 25-500 MG TABS Take 2 tablets by mouth at bedtime as needed (pain and sleep).     [provider]  feeding supplement, ENSURE ENLIVE, (ENSURE ENLIVE) LIQD Take 237 mLs by mouth 2 (two) times daily between meals. Patient not taking: Reported on 04/03/2020 02/15/20   Lorella Nimrod, MD  losartan (COZAAR) 25 MG tablet Take 1 tablet (25 mg total) by mouth daily. 02/16/20   Lorella Nimrod, MD  Multiple Vitamin (MULTIVITAMIN WITH MINERALS) TABS tablet Take 1 tablet by mouth daily. 02/16/20   Lorella Nimrod, MD  nepafenac (ILEVRO) 0.3 % ophthalmic suspension Place 1 drop into the left eye daily.  [provider]  ondansetron (ZOFRAN) 4 MG tablet Take 1 tablet (4 mg total) by mouth every 6 (six) hours as needed for nausea. Patient not taking: Reported on 03/06/2020 02/15/20   Lorella Nimrod, MD  prednisoLONE acetate (PRED MILD) 0.12 % ophthalmic suspension Place 1 drop into the left eye daily.     [provider]     Family History  Problem Relation Age of Onset   Pancreatic cancer Son    Breast cancer Sister    Lung  cancer Mother    Diabetes Other        nephew   Stroke Neg Hx    CAD Neg Hx     Social History   Socioeconomic History   Marital status: Widowed    Spouse name: Not on file   Number of children: 3   Years of education: Not on file   Highest education level: Not on file  Occupational History   Occupation: travel agent/retired  Tobacco Use   Smoking status: Never   Smokeless tobacco: Never  Vaping Use   Vaping Use: Never used  Substance and Sexual Activity   Alcohol use: Yes    Alcohol/week: 0.0 standard drinks    Comment: 1/2 glass wine nightly   Drug use: No   Sexual activity: Never  Other Topics Concern   Not on file  Social History Narrative   Lives alone at [place] - husband died of lung cancer   Daughter in [place], daughter in [place]: travel agency, retired   Edu: college   Activity: 1 mile walking, swimming, Charity fundraiser   Diet: seldom water, fruits/vegetables daily   Social Determinants of Radio broadcast assistant Strain: Not on file  Food Insecurity: Not on file  Transportation Needs: Not on file  Physical Activity: Not on file  Stress: Not on file  Social Connections: Not on file   Review of Systems: A 12 point ROS discussed and pertinent positives are indicated in the HPI above.  All other systems are negative.   Vital Signs: There were no vitals taken for this visit.  No physical examination performed in lieu of telephone visit.  Imaging: Cholecystostomy tube change 01/19/21    Labs:  CBC: Recent Labs    02/12/20 0118 02/13/20 0745 02/14/20 0614  WBC 20.2* 10.7* 8.6  HGB 12.1 9.9* 9.8*  HCT 36.0 29.0* 27.9*  PLT 284 243 268    COAGS: Recent Labs    02/12/20 0118  INR 1.3*  APTT 41*    BMP: Recent Labs    02/12/20 0118 02/13/20 0745 02/14/20 0614  NA 130* 134* 134*  K 3.3* 3.8 3.3*  CL 94* 101 103  CO2 '23 25 24  '$ GLUCOSE 145* 98 102*  BUN '12 11 10  '$ CALCIUM 8.7* 7.9* 7.8*  CREATININE 0.89  0.73 0.68  GFRNONAA 55* >60 >60  GFRAA >60 >60 >60    LIVER FUNCTION TESTS: Recent Labs    02/12/20 0118 02/13/20 0745  BILITOT 1.8* 1.1  AST 18 13*  ALT 11 9  ALKPHOS 56 45  PROT 7.1 5.7*  ALBUMIN 3.5 2.5*    TUMOR MARKERS: No results for input(s): AFPTM, CEA, CA199, CHROMGRNA in the last 8760 hours.  Assessment and Plan: 87 year old female with history of calculous cholecystitis status post percutaneous cholecystostomy tube placement on 02/12/20, last exchanged on 01/19/21.  She has been deemed a poor surgical candidate mostly due to age/frailty.  She wishes to proceed with percutaneous gallstone extraction and eventual tube removal.  I explained the risks and benefits in detail to her daughter today, and described how this is a staged procedure, requiring at least 2 more appointments after the initial to become tube free.  She stated understanding.  Plan for Spyglass-assisted percutaneous gallstone retrieval and cholecystostomy tube exchange at [facility] with moderate sedation.      Electronically Signed: Suzette Battiest, MD 01/22/2021, 1:42 PM   I spent a total of  40 Minutes  in telephone clinical consultation, greater than 50% of which was counseling/coordinating care for cholelithiasis.

## 2021-02-05 ENCOUNTER — Telehealth (HOSPITAL_COMMUNITY): Payer: Self-pay

## 2021-02-05 ENCOUNTER — Other Ambulatory Visit (HOSPITAL_COMMUNITY): Payer: Self-pay | Admitting: Interventional Radiology

## 2021-02-05 DIAGNOSIS — K810 Acute cholecystitis: Secondary | ICD-10-CM

## 2021-02-05 NOTE — Telephone Encounter (Signed)
Spoke with pt's daughter Jeani Hawking. She will discuss with the family and call me back to let me know their decision. AW

## 2021-02-05 NOTE — Telephone Encounter (Signed)
Called to schedule spyglass case with Dr. Theodoro Kos, no answer, left vm. AW

## 2021-02-06 DIAGNOSIS — K829 Disease of gallbladder, unspecified: Secondary | ICD-10-CM | POA: Diagnosis not present

## 2021-02-24 ENCOUNTER — Other Ambulatory Visit (HOSPITAL_COMMUNITY): Payer: Self-pay | Admitting: Physician Assistant

## 2021-02-25 ENCOUNTER — Other Ambulatory Visit (HOSPITAL_COMMUNITY): Payer: Self-pay | Admitting: Physician Assistant

## 2021-02-25 ENCOUNTER — Other Ambulatory Visit: Payer: Self-pay | Admitting: Internal Medicine

## 2021-02-26 ENCOUNTER — Other Ambulatory Visit: Payer: Self-pay

## 2021-02-26 ENCOUNTER — Ambulatory Visit (HOSPITAL_COMMUNITY)
Admission: RE | Admit: 2021-02-26 | Discharge: 2021-02-26 | Disposition: A | Discharge status: Home / Self Care | Payer: Medicare Other | Source: Ambulatory Visit | Attending: Interventional Radiology | Admitting: Interventional Radiology

## 2021-02-26 ENCOUNTER — Other Ambulatory Visit (HOSPITAL_COMMUNITY): Payer: Self-pay | Admitting: Interventional Radiology

## 2021-02-26 DIAGNOSIS — Z882 Allergy status to sulfonamides status: Secondary | ICD-10-CM | POA: Diagnosis not present

## 2021-02-26 DIAGNOSIS — K8060 Calculus of gallbladder and bile duct with cholecystitis, unspecified, without obstruction: Secondary | ICD-10-CM | POA: Insufficient documentation

## 2021-02-26 DIAGNOSIS — Z885 Allergy status to narcotic agent status: Secondary | ICD-10-CM | POA: Diagnosis not present

## 2021-02-26 DIAGNOSIS — K810 Acute cholecystitis: Secondary | ICD-10-CM

## 2021-02-26 DIAGNOSIS — Z79899 Other long term (current) drug therapy: Secondary | ICD-10-CM | POA: Insufficient documentation

## 2021-02-26 DIAGNOSIS — Z888 Allergy status to other drugs, medicaments and biological substances status: Secondary | ICD-10-CM | POA: Insufficient documentation

## 2021-02-26 HISTORY — PX: IR EXCHANGE BILIARY DRAIN: IMG6046

## 2021-02-26 HISTORY — PX: IR REMOVAL OF CALCULI/DEBRIS BILIARY DUCT/GB: IMG6054

## 2021-02-26 LAB — CBC
HCT: 37.5 % (ref 36.0–46.0)
Hemoglobin: 12.4 g/dL (ref 12.0–15.0)
MCH: 32 pg (ref 26.0–34.0)
MCHC: 33.1 g/dL (ref 30.0–36.0)
MCV: 96.6 fL (ref 80.0–100.0)
Platelets: 230 10*3/uL (ref 150–400)
RBC: 3.88 MIL/uL (ref 3.87–5.11)
RDW: 13.2 % (ref 11.5–15.5)
WBC: 5.3 10*3/uL (ref 4.0–10.5)
nRBC: 0 % (ref 0.0–0.2)

## 2021-02-26 LAB — GLUCOSE, CAPILLARY: Glucose-Capillary: 107 mg/dL — ABNORMAL HIGH (ref 70–99)

## 2021-02-26 LAB — BASIC METABOLIC PANEL
Anion gap: 10 (ref 5–15)
BUN: 16 mg/dL (ref 8–23)
CO2: 23 mmol/L (ref 22–32)
Calcium: 9.7 mg/dL (ref 8.9–10.3)
Chloride: 105 mmol/L (ref 98–111)
Creatinine, Ser: 0.86 mg/dL (ref 0.44–1.00)
GFR, Estimated: >60 mL/min (ref >60)
Glucose, Bld: 92 mg/dL (ref 70–99)
Potassium: 4.5 mmol/L (ref 3.5–5.1)
Sodium: 138 mmol/L (ref 135–145)

## 2021-02-26 LAB — PROTIME-INR
INR: 1 (ref 0.8–1.2)
Prothrombin Time: 13.4 seconds (ref 11.4–15.2)

## 2021-02-26 MED ORDER — SODIUM CHLORIDE 0.9 % IV SOLN
2.0000 g | INTRAVENOUS | Status: AC
  Administered 2021-02-26: 2 g via INTRAVENOUS
  Filled 2021-02-26: qty 2

## 2021-02-26 MED ORDER — FENTANYL CITRATE (PF) 100 MCG/2ML IJ SOLN
INTRAMUSCULAR | Status: DC | PRN
  Administered 2021-02-26: 25 ug via INTRAVENOUS
  Administered 2021-02-26: 12.5 ug via INTRAVENOUS
  Administered 2021-02-26 (×2): 25 ug via INTRAVENOUS
  Administered 2021-02-26 (×3): 12.5 ug via INTRAVENOUS
  Administered 2021-02-26 (×3): 25 ug via INTRAVENOUS

## 2021-02-26 MED ORDER — FENTANYL CITRATE (PF) 100 MCG/2ML IJ SOLN
INTRAMUSCULAR | Status: AC
  Filled 2021-02-26: qty 2

## 2021-02-26 MED ORDER — MIDAZOLAM HCL 2 MG/2ML IJ SOLN
INTRAMUSCULAR | Status: DC | PRN
  Administered 2021-02-26: .5 mg via INTRAVENOUS
  Administered 2021-02-26: 1 mg via INTRAVENOUS
  Administered 2021-02-26 (×5): .5 mg via INTRAVENOUS

## 2021-02-26 MED ORDER — IOHEXOL 350 MG/ML SOLN
100.0000 mL | Freq: Once | INTRAVENOUS | Status: AC | PRN
  Administered 2021-02-26: 40 mL

## 2021-02-26 MED ORDER — LIDOCAINE HCL 1 % IJ SOLN
INTRAMUSCULAR | Status: AC
  Filled 2021-02-26: qty 20

## 2021-02-26 MED ORDER — LIDOCAINE HCL 1 % IJ SOLN
INTRAMUSCULAR | Status: DC | PRN
  Administered 2021-02-26: 5 mL

## 2021-02-26 MED ORDER — ONDANSETRON HCL 4 MG/2ML IJ SOLN
4.0000 mg | Freq: Once | INTRAMUSCULAR | Status: AC
  Administered 2021-02-26: 4 mg via INTRAVENOUS
  Filled 2021-02-26: qty 2

## 2021-02-26 MED ORDER — ONDANSETRON HCL 4 MG/2ML IJ SOLN
INTRAMUSCULAR | Status: DC | PRN
  Administered 2021-02-26: 4 mg via INTRAVENOUS

## 2021-02-26 MED ORDER — MIDAZOLAM HCL 2 MG/2ML IJ SOLN
INTRAMUSCULAR | Status: AC
  Filled 2021-02-26: qty 2

## 2021-02-26 MED ORDER — SODIUM CHLORIDE 0.9 % IV SOLN: INTRAVENOUS | Status: DC

## 2021-02-26 MED ORDER — ONDANSETRON HCL 4 MG/2ML IJ SOLN
INTRAMUSCULAR | Status: AC
  Filled 2021-02-26: qty 2

## 2021-02-26 NOTE — H&P (Signed)
Referring Physician(s): Suzette Battiest  Supervising Physician: Mir, Sharen Heck  Patient Status:  [facility] - In-pt  Chief Complaint:  Gallstones/cholecystitis  Subjective: Pt known to IR service from perc GB drain placement on 02/12/20 followed by subsequent drain exchanges, latest on 01/19/21. She continues to have residual cholelithiasis as well as choledocholithiasis. She has been seen by Dr. Celine Ahr (General Surgery) who deemed her a poor surgical candidate for cholecystectomy. She underwent consultation with Dr. Serafina Royals on 01/22/21 to discuss perc gallstone retrieval with Spyglass visualization and was deemed an appropriate candidate. She presents today for the procedure. She denies fever,HA,CP,dyspnea, cough, back pain,N/V or bleeding. She has some mild soreness at GB drain site. She is HOH.  Past Medical History:  Diagnosis Date   Arthritis    Atypical nevi 09/2014   lentiginous dysplastic nevus with mod atypia ([place])   Basal cell carcinoma 08/12/2017   R distal dorsum nose    Diverticulitis    DNR (do not resuscitate)    Esophageal stricture 2012   GERD (gastroesophageal reflux disease)    HLD (hyperlipidemia)    did not tolerate statin, stopped checking   HTN (hypertension)    Leg wound, left, subsequent encounter 11/12/2017   Malignant melanoma ([place]) 2015, 2017   R chin s/p excision ([place]) R upper back [name])   Malignant melanoma ([place]) 07/27/19 WLE at [facility]   R lat deltoid   Osteoporosis, post-menopausal    bisphosphonate caused dysphagia, requests defer DEXA for now   Seasonal allergies    Squamous cell carcinoma of skin 08/21/2018   L med leg below knee   Squamous cell carcinoma of skin 01/20/2017   R pretibial lateral   Squamous cell carcinoma of skin 09/25/2020   Left med lower leg above ankle - [facility]   Wears hearing aid    Past Surgical History:  Procedure Laterality Date   BUNIONECTOMY Right 2001   CARPAL TUNNEL RELEASE Bilateral 1974   CATARACT EXTRACTION  Bilateral    [place]   IR CHOLANGIOGRAM EXISTING TUBE  04/04/2020   IR CHOLANGIOGRAM EXISTING TUBE  01/19/2021   IR RADIOLOGIST EVAL & MGMT  01/22/2021   TONSILLECTOMY  1928      Allergies: Bisphosphonates, Codeine, Statins, Sulfa antibiotics, and Procaine hcl  Medications: Prior to Admission medications   Medication Sig Start Date End Date Taking? Authorizing Provider  calcium carbonate (TUMS - DOSED IN MG ELEMENTAL CALCIUM) 500 MG chewable tablet Chew 1 tablet by mouth 2 (two) times daily as needed (GERD).   Yes [provider]  losartan (COZAAR) 25 MG tablet Take 1 tablet (25 mg total) by mouth daily. 02/16/20  Yes Lorella Nimrod, MD  nepafenac (ILEVRO) 0.3 % ophthalmic suspension Place 1 drop into the left eye daily.    Yes [provider]  prednisoLONE acetate (PRED MILD) 0.12 % ophthalmic suspension Place 1 drop into the left eye daily.    Yes [provider]  Propylene Glycol (SYSTANE COMPLETE OP) Place 1 drop into the left eye every 8 (eight) hours as needed (dry eyes).   Yes [provider]  Vitamin D, Ergocalciferol, (DRISDOL) 1.25 MG (50000 UNIT) CAPS capsule Take 50,000 Units by mouth every 30 (thirty) days. Give on the 4th of every month.   Yes [provider]  acetaminophen (TYLENOL) 500 MG tablet Take 500 mg by mouth every 4 (four) hours as needed for mild pain.    [provider]  bisacodyl (DULCOLAX) 10 MG suppository Place 10 mg rectally daily  as needed for moderate constipation.    [provider]  ibuprofen (ADVIL) 200 MG tablet Take 400 mg by mouth every 12 (twelve) hours as needed for mild pain.    [provider]  magnesium hydroxide (MILK OF MAGNESIA) 400 MG/5ML suspension Take 30 mLs by mouth every 12 (twelve) hours as needed for mild constipation.    [provider]  ondansetron (ZOFRAN) 4 MG tablet Take 1 tablet (4 mg total) by mouth every 6 (six) hours as needed for nausea. 02/15/20    Lorella Nimrod, MD  polyethylene glycol (MIRALAX / GLYCOLAX) 17 g packet Take 17 g by mouth daily as needed for moderate constipation.    [provider]     Vital Signs: BP (!) 143/72   Pulse 75   Temp 97.8 F (36.6 C) (Oral)   Resp 18   Ht 5\' 3"  (1.6 m)   Wt 115 lb (52.2 kg)   SpO2 100%   BMI 20.37 kg/m   Physical Exam awake, some mild confusion noted; chest- CTA bilat; heart- RRR,+murmur, abd- soft,+BS,GB drain intact draining yellow bile; no LE edema  Imaging: No results found.  Labs:  CBC: Recent Labs    02/26/21 0800  WBC 5.3  HGB 12.4  HCT 37.5  PLT 230    COAGS: Recent Labs    02/26/21 0800  INR 1.0    BMP: No results for input(s): NA, K, CL, CO2, GLUCOSE, BUN, CALCIUM, CREATININE, GFRNONAA, GFRAA in the last 8760 hours.  Invalid input(s): CMP  LIVER FUNCTION TESTS: No results for input(s): BILITOT, AST, ALT, ALKPHOS, PROT, ALBUMIN in the last 8760 hours.  Assessment and Plan: Pt known to IR service from perc GB drain placement on 02/12/20 followed by subsequent drain exchanges, latest on 01/19/21. She continues to have residual cholelithiasis as well as choledocholithiasis. She has been seen by Dr. Celine Ahr (General Surgery) who deemed her a poor surgical candidate for cholecystectomy. She underwent consultation with Dr. Serafina Royals on 01/22/21 to discuss perc gallstone retrieval with Spyglass visualization and was deemed an appropriate candidate. She presents today for the procedure along with drain exchange. Details/risks of procedure, incl but not limited to, internal bleeding, infection, injury to adjacent structures d/w pt/daughters with their understanding and consent.    Electronically Signed: D. Rowe Robert, PA-C 02/26/2021, 8:59 AM   I spent a total of 25 minutes at the the patient's bedside AND on the patient's hospital floor or unit, greater than 50% of which was counseling/coordinating care for image guided percutaneous gallstone retrieval  with Spyglass visualization/drain exchange

## 2021-02-26 NOTE — Procedures (Signed)
Interventional Radiology Procedure Note  Procedure: Cholangiogram with maceration and extraction of choledocholith's and cholelith's  Indication: Gallstones and CBD stones.  Findings: Please refer to procedural dictation for full description.  Complications: None  EBL: < 10 mL  Miachel Roux, MD [phone]

## 2021-02-26 NOTE — Progress Notes (Signed)
Brief post procedure note:  Patient seen in short stay, both daughters and RN at bedside. Patient reports feeling a little weak but overall much better than earlier, denies abdominal pain, n/v. Per family patient tolerating some solids and liquids. Per RN patient voided on bed pain earlier. VSS. Patient asking to go home.  Family expressing some concerns regarding transportation back to SNF due to weakness, they report she uses walker at baseline and is quite mobile. There is 24/7 staff at SNF who have agreed to monitor patient closely and administer medications as ordered. They are asking for Tylenol to be scheduled to get ahead of pain. Daughters feel comfortable with patient returning to SNF tonight and are staying in hotel close by in case they are needed.  Return precautions reviewed. Medications faxed to SNF as requested (Zofran 8 mg Q8H PRN x 48H, tylenol 500 mg Q6H x 48H, Tramadol 50 mg Q12H PRN for breakthrough pain x 48H).  Candiss Norse, PA-C

## 2021-02-26 NOTE — Progress Notes (Signed)
Attempted to call [facility] facility three times to give report on pt post procedure but no one answered. AVS reviewed with pt and daughters and paper copy sent with family to facility. Pt's daughters aware that [facility] not answering to give report.

## 2021-02-26 NOTE — Progress Notes (Signed)
Pt very nauseous upon return from procedure. MD notified and put in order for 4mg  Zofran. Zofran given as ordered. Pt reports feeling some better and appears more alert.

## 2021-02-26 NOTE — Progress Notes (Signed)
Gave report to [facility] facility receiving RN.

## 2021-02-27 ENCOUNTER — Encounter: Payer: Self-pay | Admitting: General Surgery

## 2021-03-03 LAB — BASIC METABOLIC PANEL
BUN: 14 (ref 4–21)
CO2: 28 — AB (ref 13–22)
Chloride: 103 (ref 99–108)
Creatinine: 0.8 (ref 0.5–1.1)
Glucose: 87
Potassium: 4.1 mEq/L (ref 3.5–5.1)
Sodium: 139 (ref 137–147)

## 2021-03-03 LAB — COMPREHENSIVE METABOLIC PANEL: Calcium: 9.6 (ref 8.7–10.7)

## 2021-03-06 DIAGNOSIS — Z9359 Other cystostomy status: Secondary | ICD-10-CM | POA: Diagnosis not present

## 2021-03-09 ENCOUNTER — Encounter: Payer: Self-pay | Admitting: Hematology and Oncology

## 2021-03-09 ENCOUNTER — Telehealth (HOSPITAL_COMMUNITY): Payer: Self-pay

## 2021-03-09 ENCOUNTER — Encounter: Payer: Self-pay | Admitting: Interventional Radiology

## 2021-03-09 NOTE — Telephone Encounter (Signed)
Daughter called worried about the retention suture to drain being cut. Reviewed with PA. This is fine and there are internal sutures that hold it in place. If she starts to have pain, little output, please give [place] a call. Daughter agreed. AW

## 2021-03-13 DIAGNOSIS — M1990 Unspecified osteoarthritis, unspecified site: Secondary | ICD-10-CM | POA: Diagnosis not present

## 2021-03-13 DIAGNOSIS — K219 Gastro-esophageal reflux disease without esophagitis: Secondary | ICD-10-CM | POA: Diagnosis not present

## 2021-03-13 DIAGNOSIS — K811 Chronic cholecystitis: Secondary | ICD-10-CM | POA: Diagnosis not present

## 2021-03-13 DIAGNOSIS — Z433 Encounter for attention to colostomy: Secondary | ICD-10-CM

## 2021-03-13 DIAGNOSIS — E43 Unspecified severe protein-calorie malnutrition: Secondary | ICD-10-CM | POA: Diagnosis not present

## 2021-04-28 ENCOUNTER — Ambulatory Visit: Payer: Medicare Other | Admitting: Dermatology

## 2021-05-14 DIAGNOSIS — I10 Essential (primary) hypertension: Secondary | ICD-10-CM | POA: Diagnosis not present

## 2021-05-14 DIAGNOSIS — K219 Gastro-esophageal reflux disease without esophagitis: Secondary | ICD-10-CM

## 2021-05-14 DIAGNOSIS — K811 Chronic cholecystitis: Secondary | ICD-10-CM | POA: Diagnosis not present

## 2021-05-14 DIAGNOSIS — M159 Polyosteoarthritis, unspecified: Secondary | ICD-10-CM | POA: Diagnosis not present

## 2021-05-14 DIAGNOSIS — E441 Mild protein-calorie malnutrition: Secondary | ICD-10-CM | POA: Diagnosis not present

## 2021-06-24 DIAGNOSIS — K811 Chronic cholecystitis: Secondary | ICD-10-CM

## 2021-06-24 DIAGNOSIS — I10 Essential (primary) hypertension: Secondary | ICD-10-CM

## 2021-06-24 DIAGNOSIS — K219 Gastro-esophageal reflux disease without esophagitis: Secondary | ICD-10-CM

## 2021-06-24 DIAGNOSIS — M1990 Unspecified osteoarthritis, unspecified site: Secondary | ICD-10-CM

## 2021-06-24 DIAGNOSIS — E43 Unspecified severe protein-calorie malnutrition: Secondary | ICD-10-CM

## 2021-06-24 DIAGNOSIS — H6122 Impacted cerumen, left ear: Secondary | ICD-10-CM

## 2021-06-26 ENCOUNTER — Other Ambulatory Visit: Payer: Self-pay

## 2021-06-26 ENCOUNTER — Non-Acute Institutional Stay: Payer: Medicare Other | Admitting: Student

## 2021-06-26 DIAGNOSIS — Z515 Encounter for palliative care: Secondary | ICD-10-CM

## 2021-06-26 DIAGNOSIS — K82A1 Gangrene of gallbladder in cholecystitis: Secondary | ICD-10-CM

## 2021-06-26 NOTE — Progress Notes (Signed)
[facility] Consult Note Telephone: [phone]  Fax: [phone]    Date of encounter: 06/26/21 11:14 AM PATIENT NAME: Teresa Meyer [address] [place] [address]   [phone] (home)  DOB: 1922-10-18 MRN: [id number] PRIMARY CARE PROVIDER:    Venia Carbon, MD,  [place] [address] [phone]  REFERRING PROVIDER:   Venia Carbon, MD [address] [place],  [place] [address] [phone]  RESPONSIBLE PARTY:    Contact Information     Name Relation Home Work Mobile   Teresa Meyer Daughter [phone]     Marcello Moores Daughter   [phone]   Ignacia Palma [phone]          I met face to face with patient in [place] facility. Palliative Care was asked to follow this patient by consultation request of  Venia Carbon, MD to address advance care planning and complex medical decision making. This is a follow up visit.                                   ASSESSMENT AND PLAN / RECOMMENDATIONS:   Advance Care Planning/Goals of Care: Goals include to maximize quality of life and symptom management. Patient/health care surrogate gave his/her permission to discuss. Our advance care planning conversation included a discussion about:    The value and importance of advance care planning  Experiences with loved ones who have been seriously ill or have died  Exploration of personal, cultural or spiritual beliefs that might influence medical decisions  Exploration of goals of care in the event of a sudden injury or illness  CODE STATUS: Do Not Resuscitate  Symptom Management/Plan:  Gangrene cholecystitis: Patient with drain. Drain patent and currently stable, staff to continue drain care. Educated patient about carrying drain in bag and attempting to keep off the floor to promote infection prevention. Will check in routinely to follow-up. Patient to  follow up with [facility] as scheduled for drain exchanges.   Follow up Palliative Care Visit: Palliative care will continue to follow for complex medical decision making, advance care planning, and clarification of goals. Return 3-4 months or prn.   This visit was coded based on medical decision making (MDM).  PPS: 60%  HOSPICE ELIGIBILITY/DIAGNOSIS: TBD  Chief Complaint: Routine Palliative Care follow-up  HISTORY OF PRESENT ILLNESS:  Teresa Meyer is a 88 y.o. year old female  with HTN, chronic venous insufficiency, and cholecystitis with gangrenous gallbladder, drain in place. Patient deemed a poor surgical candidate for cholecystectomy; she underwent percutaneous gallstone retrieval with Spyglass visualization and drain exchange.   Resides at [facility]. Doing well per patient and staff. Patient denies any pain or nausea. She reports resting well and having a good appetite with cravings for lemon custard from [place] in [place]. Her only complaint is that she cannot be as active as usual. One of her neighbors has COVID, so she cannot leave her room to walk the halls.   History obtained from review of EMR, discussion with primary team, and interview with family, facility staff/caregiver and/or Teresa Meyer.  I reviewed available labs, medications, imaging, studies and related documents from the EMR.  Records reviewed and summarized above.    Physical Exam: Current and past weights: 117.8 (06/24/21), 116 (06/24/20) Pulse 64, resp 16, b/p 136/68 , sats 96-97%% on room air  Constitutional: NAD General: frail appearing, thin EYES: anicteric sclera, lids intact, no discharge  ENMT: hearing impaired, worse in left ear, oral mucous membranes moist, dentition intact CV: S1S2, RRR, no LE edema Pulmonary: LCTA, no increased work of breathing, no cough, room air Abdomen: normo-active BS + 4 quadrants, soft and non tender, no ascites GU: deferred MSK: no sarcopenia, moves all extremities,  ambulatory Skin: warm and dry, no rashes or wounds on visible skin Neuro:  no generalized weakness,  no cognitive impairment Psych: non-anxious affect, A and O x 3 Hem/lymph/immuno: no widespread bruising   Thank you for the opportunity to participate in the care of Teresa Meyer.  The palliative care team will continue to follow. Please call our office at [phone] if we can be of additional assistance.   Ezekiel Slocumb, NP   Zachery Dakins, RN DNP-Student  COVID-19 PATIENT SCREENING TOOL Asked and negative response unless otherwise noted:   Have you had symptoms of covid, tested positive or been in contact with someone with symptoms/positive test in the past 5-10 days? No

## 2021-07-06 ENCOUNTER — Other Ambulatory Visit: Payer: Self-pay

## 2021-07-06 ENCOUNTER — Ambulatory Visit (INDEPENDENT_AMBULATORY_CARE_PROVIDER_SITE_OTHER): Payer: Medicare Other | Admitting: Dermatology

## 2021-07-06 DIAGNOSIS — Z1283 Encounter for screening for malignant neoplasm of skin: Secondary | ICD-10-CM | POA: Diagnosis not present

## 2021-07-06 DIAGNOSIS — L720 Epidermal cyst: Secondary | ICD-10-CM | POA: Diagnosis not present

## 2021-07-06 DIAGNOSIS — D1800 Hemangioma unspecified site: Secondary | ICD-10-CM

## 2021-07-06 DIAGNOSIS — L820 Inflamed seborrheic keratosis: Secondary | ICD-10-CM

## 2021-07-06 DIAGNOSIS — L570 Actinic keratosis: Secondary | ICD-10-CM

## 2021-07-06 DIAGNOSIS — Z86018 Personal history of other benign neoplasm: Secondary | ICD-10-CM

## 2021-07-06 DIAGNOSIS — L821 Other seborrheic keratosis: Secondary | ICD-10-CM

## 2021-07-06 DIAGNOSIS — L814 Other melanin hyperpigmentation: Secondary | ICD-10-CM

## 2021-07-06 DIAGNOSIS — Z85828 Personal history of other malignant neoplasm of skin: Secondary | ICD-10-CM

## 2021-07-06 DIAGNOSIS — D229 Melanocytic nevi, unspecified: Secondary | ICD-10-CM

## 2021-07-06 DIAGNOSIS — L219 Seborrheic dermatitis, unspecified: Secondary | ICD-10-CM | POA: Diagnosis not present

## 2021-07-06 DIAGNOSIS — Z85820 Personal history of malignant melanoma of skin: Secondary | ICD-10-CM

## 2021-07-06 DIAGNOSIS — L578 Other skin changes due to chronic exposure to nonionizing radiation: Secondary | ICD-10-CM | POA: Diagnosis not present

## 2021-07-06 MED ORDER — KETOCONAZOLE 2 % EX CREA: TOPICAL_CREAM | CUTANEOUS | 3 refills | Status: DC

## 2021-07-06 NOTE — Progress Notes (Signed)
Follow-Up Visit   Subjective  Teresa Meyer is a 88 y.o. female who presents for the following: Follow-up.  The patient presents for Total-Body Skin Exam (TBSE) for skin cancer screening and mole check.  The patient has spots, moles and lesions to be evaluated, some may be new or changing. She has a history of melanoma, SCC, BCC, and dysplastic nevus.   Patient accompanied by caregiver.  The following portions of the chart were reviewed this encounter and updated as appropriate:       Review of Systems:  No other skin or systemic complaints except as noted in HPI or Assessment and Plan.  Objective  Well appearing patient in no apparent distress; mood and affect are within normal limits.  A full examination was performed including scalp, head, eyes, ears, nose, lips, neck, chest, axillae, abdomen, back, buttocks, bilateral upper extremities, bilateral lower extremities, hands, feet, fingers, toes, fingernails, and toenails. All findings within normal limits unless otherwise noted below.  L ant thigh x 1, L ant ankle x 1, R lower back x 1, R forearm x 2, L post ankle x 1 (6) Erythematous keratotic papules  Lower Neck Firm white nodule.   L temple, L temporal scalp Pink patches with greasy scale.   R lat lower lip x 1 Pink scaly macule    Assessment & Plan  Skin cancer screening performed today.  Actinic Damage - chronic, secondary to cumulative UV radiation exposure/sun exposure over time - diffuse scaly erythematous macules with underlying dyspigmentation - Recommend daily broad spectrum sunscreen SPF 30+ to sun-exposed areas, reapply every 2 hours as needed.  - Recommend staying in the shade or wearing long sleeves, sun glasses (UVA+UVB protection) and wide brim hats (4-inch brim around the entire circumference of the hat). - Call for new or changing lesions.  Lentigines - Scattered tan macules - Due to sun exposure - Benign-appering, observe - Recommend daily  broad spectrum sunscreen SPF 30+ to sun-exposed areas, reapply every 2 hours as needed. - Call for any changes  Seborrheic Keratoses - Stuck-on, waxy, tan-brown papules and/or plaques  - Benign-appearing - Discussed benign etiology and prognosis. - Observe - Call for any changes  Hemangiomas - Red papules - Discussed benign nature - Observe - Call for any changes  History of Melanoma - No evidence of recurrence today of the R lat deltoid 3/21, R upper back, R chin - Recommend regular full body skin exams - Recommend daily broad spectrum sunscreen SPF 30+ to sun-exposed areas, reapply every 2 hours as needed.  - Call if any new or changing lesions are noted between office visits  History of Squamous Cell Carcinoma of the Skin - No evidence of recurrence today - Recommend regular full body skin exams - Recommend daily broad spectrum sunscreen SPF 30+ to sun-exposed areas, reapply every 2 hours as needed.  - Call if any new or changing lesions are noted between office visits  History of Basal Cell Carcinoma of the Skin - No evidence of recurrence today - Recommend regular full body skin exams - Recommend daily broad spectrum sunscreen SPF 30+ to sun-exposed areas, reapply every 2 hours as needed.  - Call if any new or changing lesions are noted between office visits  History of Dysplastic Nevi - No evidence of recurrence today - Recommend regular full body skin exams - Recommend daily broad spectrum sunscreen SPF 30+ to sun-exposed areas, reapply every 2 hours as needed.  - Call if any new or changing  lesions are noted between office visits  Inflamed seborrheic keratosis (6) L ant thigh x 1, L ant ankle x 1, R lower back x 1, R forearm x 2, L post ankle x 1  vs Hypertrophic Aks  Recheck L ant ankle on f/u.  Destruction of lesion - L ant thigh x 1, L ant ankle x 1, R lower back x 1, R forearm x 2, L post ankle x 1  Destruction method: cryotherapy   Informed consent:  discussed and consent obtained   Lesion destroyed using liquid nitrogen: Yes   Region frozen until ice ball extended beyond lesion: Yes   Outcome: patient tolerated procedure well with no complications   Post-procedure details: wound care instructions given   Additional details:  Prior to procedure, discussed risks of blister formation, small wound, skin dyspigmentation, or rare scar following cryotherapy. Recommend Vaseline ointment to treated areas while healing.   Epidermal inclusion cyst Lower Neck  Benign-appearing. Exam most consistent with an epidermal inclusion cyst. Discussed that a cyst is a benign growth that can grow over time and sometimes get irritated or inflamed. Recommend observation if it is not bothersome. Discussed option of surgical excision to remove it if it is growing, symptomatic, or other changes noted. Please call for new or changing lesions so they can be evaluated.    Seborrheic dermatitis L temple, L temporal scalp  Start ketoconazole 2% cream Apply QD/BID AA prn dsp   Seborrheic Dermatitis  -  is a chronic persistent rash characterized by pinkness and scaling most commonly of the mid face but also can occur on the scalp (dandruff), ears; mid chest, mid back and groin.  It tends to be exacerbated by stress and cooler weather.  People who have neurologic disease may experience new onset or exacerbation of existing seborrheic dermatitis.  The condition is not curable but treatable and can be controlled.  ketoconazole (NIZORAL) 2 % cream - L temple, L temporal scalp Apply to pink scaly areas on face/temple areas once to twice daily until improved  AK (actinic keratosis) R lat lower lip x 1  Actinic keratoses are precancerous spots that appear secondary to cumulative UV radiation exposure/sun exposure over time. They are chronic with expected duration over 1 year. A portion of actinic keratoses will progress to squamous cell carcinoma of the skin. It is not  possible to reliably predict which spots will progress to skin cancer and so treatment is recommended to prevent development of skin cancer.  Recommend daily broad spectrum sunscreen SPF 30+ to sun-exposed areas, reapply every 2 hours as needed.  Recommend staying in the shade or wearing long sleeves, sun glasses (UVA+UVB protection) and wide brim hats (4-inch brim around the entire circumference of the hat). Call for new or changing lesions.  Destruction of lesion - R lat lower lip x 1  Destruction method: cryotherapy   Informed consent: discussed and consent obtained   Lesion destroyed using liquid nitrogen: Yes   Region frozen until ice ball extended beyond lesion: Yes   Outcome: patient tolerated procedure well with no complications   Post-procedure details: wound care instructions given   Additional details:  Prior to procedure, discussed risks of blister formation, small wound, skin dyspigmentation, or rare scar following cryotherapy. Recommend Vaseline ointment to treated areas while healing.    Return in about 6 months (around 01/03/2022) for TBSE, Hx melanoma, Hx SCC, Hx BCC, AKs, Recheck L ankle.  IJamesetta Orleans, CMA, am acting as scribe for [employer],  MD . Documentation: I have reviewed the above documentation for accuracy and completeness, and I agree with the above.  Brendolyn Patty MD

## 2021-07-06 NOTE — Patient Instructions (Addendum)
Cryotherapy Aftercare  Wash gently with soap and water everyday.   Apply Vaseline and Band-Aid daily until healed.   Seborrheic Dermatitis  -  is a chronic persistent rash characterized by pinkness and scaling most commonly of the mid face but also can occur on the scalp (dandruff), ears; mid chest, mid back and groin.  It tends to be exacerbated by stress and cooler weather.  People who have neurologic disease may experience new onset or exacerbation of existing seborrheic dermatitis.  The condition is not curable but treatable and can be controlled.  Ketoconazole 2% cream - Apply to pink, scaly areas on face/left temple once or twice daily until improved.   If You Need Anything After Your Visit  If you have any questions or concerns for your doctor, please call our main line at [phone] and press option 4 to reach your doctor's medical assistant. If no one answers, please leave a voicemail as directed and we will return your call as soon as possible. Messages left after 4 pm will be answered the following business day.   You may also send [place] a message via [place]. We typically respond to MyChart messages within 1-2 business days.  For prescription refills, please ask your pharmacy to contact our office. Our fax number is [phone].  If you have an urgent issue when the clinic is closed that cannot wait until the next business day, you can page your doctor at the number below.    Please note that while we do our best to be available for urgent issues outside of office hours, we are not available 24/7.   If you have an urgent issue and are unable to reach [place], you may choose to seek medical care at your doctor's office, retail clinic, urgent care center, or emergency room.  If you have a medical emergency, please immediately call 911 or go to the emergency department.  Pager Numbers  - Dr. Nehemiah Massed: [phone]  - Dr. Laurence Ferrari: [phone]  - Dr. Nicole Kindred: [phone]  In the  event of inclement weather, please call our main line at [phone] for an update on the status of any delays or closures.  Dermatology Medication Tips: Please keep the boxes that topical medications come in in order to help keep track of the instructions about where and how to use these. Pharmacies typically print the medication instructions only on the boxes and not directly on the medication tubes.   If your medication is too expensive, please contact our office at [phone] option 4 or send [place] a message through [place].   We are unable to tell what your co-pay for medications will be in advance as this is different depending on your insurance coverage. However, we may be able to find a substitute medication at lower cost or fill out paperwork to get insurance to cover a needed medication.   If a prior authorization is required to get your medication covered by your insurance company, please allow [place] 1-2 business days to complete this process.  Drug prices often vary depending on where the prescription is filled and some pharmacies may offer cheaper prices.  The website www.goodrx.com contains coupons for medications through different pharmacies. The prices here do not account for what the cost may be with help from insurance (it may be cheaper with your insurance), but the website can give you the price if you did not use any insurance.  - You can print the associated coupon and take it with your prescription to  the pharmacy.  - You may also stop by our office during regular business hours and pick up a GoodRx coupon card.  - If you need your prescription sent electronically to a different pharmacy, notify our office through [facility] or by phone at [phone] option 4.     Si Usted Necesita Algo Despus de Su Visita  Tambin puede enviarnos un mensaje a travs de Pharmacist, community. Por lo general respondemos a los mensajes de MyChart en el transcurso de 1 a 2 das hbiles.  Para  renovar recetas, por favor pida a su farmacia que se ponga en contacto con nuestra oficina. Harland Dingwall de fax es [place] [phone].  Si tiene un asunto urgente cuando la clnica est cerrada y que no puede esperar hasta el siguiente da hbil, puede llamar/localizar a su doctor(a) al nmero que aparece a continuacin.   Por favor, tenga en cuenta que aunque hacemos todo lo posible para estar disponibles para asuntos urgentes fuera del horario de [place], no estamos disponibles las 24 horas del da, los 7 das de la [place].   Si tiene un problema urgente y no puede comunicarse con nosotros, puede optar por buscar atencin mdica  en el consultorio de su doctor(a), en una clnica privada, en un centro de atencin urgente o en una sala de emergencias.  Si tiene Engineering geologist, por favor llame inmediatamente al 911 o vaya a la sala de emergencias.  Nmeros de bper  - Dr. Nehemiah Massed: [phone]  - Dra. Moye: [phone]  - Dra. Nicole Kindred: [phone]  En caso de inclemencias del [place], por favor llame a Johnsie Kindred principal al [phone] para una actualizacin sobre el [place] de cualquier retraso o cierre.  Consejos para la medicacin en dermatologa: Por favor, guarde las cajas en las que vienen los medicamentos de uso tpico para ayudarle a seguir las instrucciones sobre dnde y cmo usarlos. Las farmacias generalmente imprimen las instrucciones del medicamento slo en las cajas y no directamente en los tubos del [place].   Si su medicamento es muy caro, por favor, pngase en contacto con Zigmund Daniel llamando al [phone] y presione la opcin 4 o envenos un mensaje a travs de Pharmacist, community.   No podemos decirle cul ser su copago por los medicamentos por adelantado ya que esto es diferente dependiendo de la cobertura de su seguro. Sin embargo, es posible que podamos encontrar un medicamento sustituto a Electrical engineer un formulario para que el seguro cubra el  medicamento que se considera necesario.   Si se requiere una autorizacin previa para que su compaa de seguros [place] su medicamento, por favor permtanos de 1 a 2 das hbiles para completar este proceso.  Los precios de los medicamentos varan con frecuencia dependiendo del Environmental consultant de dnde se surte la receta y alguna farmacias pueden ofrecer precios ms baratos.  El sitio web www.goodrx.com tiene cupones para medicamentos de Airline pilot. Los precios aqu no tienen en cuenta lo que podra costar con la ayuda del seguro (puede ser ms barato con su seguro), pero el sitio web puede darle el precio si no utiliz Research scientist (physical sciences).  - Puede imprimir el cupn correspondiente y llevarlo con su receta a la farmacia.  - Tambin puede pasar por nuestra oficina durante el horario de atencin regular y Charity fundraiser una tarjeta de cupones de GoodRx.  - Si necesita que su receta se enve electrnicamente a Chiropodist, informe a nuestra oficina a travs [place] o por telfono  llamando al [phone] y presione la opcin 4.

## 2021-07-07 ENCOUNTER — Ambulatory Visit: Payer: TRICARE For Life (TFL) | Admitting: Dermatology

## 2021-07-13 DIAGNOSIS — H6122 Impacted cerumen, left ear: Secondary | ICD-10-CM | POA: Diagnosis not present

## 2021-08-31 ENCOUNTER — Other Ambulatory Visit: Payer: Self-pay | Admitting: Internal Medicine

## 2021-08-31 DIAGNOSIS — K819 Cholecystitis, unspecified: Secondary | ICD-10-CM

## 2021-09-01 ENCOUNTER — Other Ambulatory Visit: Payer: Self-pay | Admitting: Interventional Radiology

## 2021-09-01 ENCOUNTER — Other Ambulatory Visit: Payer: Self-pay | Admitting: Internal Medicine

## 2021-09-01 ENCOUNTER — Ambulatory Visit
Admission: RE | Admit: 2021-09-01 | Discharge: 2021-09-01 | Disposition: A | Discharge status: Home / Self Care | Payer: Medicare Other | Source: Ambulatory Visit | Attending: Internal Medicine | Admitting: Internal Medicine

## 2021-09-01 DIAGNOSIS — Z434 Encounter for attention to other artificial openings of digestive tract: Secondary | ICD-10-CM | POA: Diagnosis present

## 2021-09-01 DIAGNOSIS — K819 Cholecystitis, unspecified: Secondary | ICD-10-CM | POA: Diagnosis not present

## 2021-09-01 HISTORY — PX: IR EXCHANGE BILIARY DRAIN: IMG6046

## 2021-09-01 MED ORDER — SODIUM CHLORIDE 0.9% FLUSH
5.0000 mL | Freq: Three times a day (TID) | INTRAVENOUS | Status: DC
  Filled 2021-09-01: qty 6

## 2021-09-01 MED ORDER — ACETAMINOPHEN 325 MG PO TABS
ORAL_TABLET | ORAL | Status: AC
  Filled 2021-09-01: qty 2

## 2021-09-01 MED ORDER — ACETAMINOPHEN 325 MG PO TABS
650.0000 mg | ORAL_TABLET | Freq: Four times a day (QID) | ORAL | Status: DC | PRN
  Administered 2021-09-01: 650 mg via ORAL
  Filled 2021-09-01: qty 2

## 2021-09-01 MED ORDER — IOHEXOL 350 MG/ML SOLN
10.0000 mL | Freq: Once | INTRAVENOUS | Status: AC | PRN
  Administered 2021-09-01: 10 mL
  Filled 2021-09-01: qty 10

## 2021-09-01 MED ORDER — LIDOCAINE HCL 1 % IJ SOLN
INTRAMUSCULAR | Status: AC
  Administered 2021-09-01: 4 mL
  Filled 2021-09-01: qty 20

## 2021-09-01 NOTE — Progress Notes (Signed)
Dr. Denna Haggard in at bedside to speak with pt. Re: chole tube change out. Pt. Verbalized understanding of conversation now.  ?

## 2021-09-01 NOTE — Procedures (Signed)
Interventional Radiology Procedure Note  Date of Procedure: 09/01/2021  Procedure: Chole tube exchange   Findings:  1. Successful chole tube exchange for new 14 Fr chole tube, placed to bag drainage    Complications: No immediate complications noted.   Estimated Blood Loss: minimal  Follow-up and Recommendations: 1. Follow up in 12 weeks for routine exchange    Albin Felling, MD  Vascular & Interventional Radiology  09/01/2021 10:07 AM

## 2021-09-01 NOTE — Discharge Instructions (Addendum)
Flush catheter 3x/day with 5 ml NS ?Change drsg. Around choley tube QD: do not use scissors around tube! ?Return to [place] in 3 months to exchange tube out @ hospital [facility]). See you in July! :) ?

## 2021-09-02 ENCOUNTER — Ambulatory Visit: Payer: Medicare Other | Admitting: Radiology

## 2021-09-03 DIAGNOSIS — K219 Gastro-esophageal reflux disease without esophagitis: Secondary | ICD-10-CM | POA: Diagnosis not present

## 2021-09-03 DIAGNOSIS — I10 Essential (primary) hypertension: Secondary | ICD-10-CM | POA: Diagnosis not present

## 2021-09-03 DIAGNOSIS — M159 Polyosteoarthritis, unspecified: Secondary | ICD-10-CM | POA: Diagnosis not present

## 2021-09-03 DIAGNOSIS — K811 Chronic cholecystitis: Secondary | ICD-10-CM | POA: Diagnosis not present

## 2021-09-09 ENCOUNTER — Ambulatory Visit (INDEPENDENT_AMBULATORY_CARE_PROVIDER_SITE_OTHER): Payer: Medicare Other | Admitting: Dermatology

## 2021-09-09 ENCOUNTER — Encounter: Payer: Self-pay | Admitting: Dermatology

## 2021-09-09 DIAGNOSIS — L821 Other seborrheic keratosis: Secondary | ICD-10-CM

## 2021-09-09 DIAGNOSIS — C44729 Squamous cell carcinoma of skin of left lower limb, including hip: Secondary | ICD-10-CM | POA: Diagnosis not present

## 2021-09-09 DIAGNOSIS — L578 Other skin changes due to chronic exposure to nonionizing radiation: Secondary | ICD-10-CM | POA: Diagnosis not present

## 2021-09-09 DIAGNOSIS — D492 Neoplasm of unspecified behavior of bone, soft tissue, and skin: Secondary | ICD-10-CM

## 2021-09-09 MED ORDER — MUPIROCIN 2 % EX OINT
TOPICAL_OINTMENT | CUTANEOUS | 1 refills | Status: DC
Start: 2021-09-09 — End: 2022-02-05

## 2021-09-09 NOTE — Patient Instructions (Addendum)
Wound Care Instructions ? ?Cleanse wound gently with soap and water once a day then pat dry with clean gauze.  Apply Mupirocin ointment once daily cover wtih bandage. Do not pick or remove scabs. Do not remove the yellow or white "healing tissue" from the base of the wound. ? ?Cover the wound with fresh, clean, nonstick gauze and secure with paper tape. You may use Band-Aids in place of gauze and tape if the would is small enough, but would recommend trimming much of the tape off as there is often too much. Sometimes Band-Aids can irritate the skin. ? ?You should call the office for your biopsy report after 1 week if you have not already been contacted. ? ?If you experience any problems, such as abnormal amounts of bleeding, swelling, significant bruising, significant pain, or evidence of infection, please call the office immediately. ? ?FOR ADULT SURGERY PATIENTS: If you need something for pain relief you may take 1 extra strength Tylenol (acetaminophen) AND 2 Ibuprofen ('200mg'$  each) together every 4 hours as needed for pain. (do not take these if you are allergic to them or if you have a reason you should not take them.) Typically, you may only need pain medication for 1 to 3 days.  ? ?For dry spots on lower legs and feet:  ?Recommend starting moisturizer with exfoliant (Urea, Salicylic acid, or Lactic acid) one to two times daily to help smooth rough and bumpy skin.  OTC options include Cetaphil Rough and Bumpy lotion (Urea), Eucerin Roughness Relief lotion or spot treatment cream (Urea), CeraVe SA lotion/cream for Rough and Bumpy skin (Sal Acid), Gold Bond Rough and Bumpy cream (Sal Acid), and AmLactin 12% lotion/cream (Lactic Acid).  If applying in morning, also apply sunscreen to sun-exposed areas, since these exfoliating moisturizers can increase sensitivity to sun.  ? ?If You Need Anything After Your Visit ? ?If you have any questions or concerns for your doctor, please call our main line at [phone] and  press option 4 to reach your doctor's medical assistant. If no one answers, please leave a voicemail as directed and we will return your call as soon as possible. Messages left after 4 pm will be answered the following business day.  ? ?You may also send [place] a message via MyChart. We typically respond to MyChart messages within 1-2 business days. ? ?For prescription refills, please ask your pharmacy to contact our office. Our fax number is [phone]. ? ?If you have an urgent issue when the clinic is closed that cannot wait until the next business day, you can page your doctor at the number below.   ? ?Please note that while we do our best to be available for urgent issues outside of office hours, we are not available 24/7.  ? ?If you have an urgent issue and are unable to reach [place], you may choose to seek medical care at your doctor's office, retail clinic, urgent care center, or emergency room. ? ?If you have a medical emergency, please immediately call 911 or go to the emergency department. ? ?Pager Numbers ? ?- Dr. Nehemiah Massed: [phone] ? ?- Dr. Laurence Ferrari: [phone] ? ?- Dr. Nicole Kindred: [phone] ? ?In the event of inclement weather, please call our main line at [phone] for an update on the status of any delays or closures. ? ?Dermatology Medication Tips: ?Please keep the boxes that topical medications come in in order to help keep track of the instructions about where and how to use these. Pharmacies typically print the medication  instructions only on the boxes and not directly on the medication tubes.  ? ?If your medication is too expensive, please contact our office at [phone] option 4 or send [place] a message through [place].  ? ?We are unable to tell what your co-pay for medications will be in advance as this is different depending on your insurance coverage. However, we may be able to find a substitute medication at lower cost or fill out paperwork to get insurance to cover a needed medication.  ? ?If  a prior authorization is required to get your medication covered by your insurance company, please allow [place] 1-2 business days to complete this process. ? ?Drug prices often vary depending on where the prescription is filled and some pharmacies may offer cheaper prices. ? ?The website www.goodrx.com contains coupons for medications through different pharmacies. The prices here do not account for what the cost may be with help from insurance (it may be cheaper with your insurance), but the website can give you the price if you did not use any insurance.  ?- You can print the associated coupon and take it with your prescription to the pharmacy.  ?- You may also stop by our office during regular business hours and pick up a GoodRx coupon card.  ?- If you need your prescription sent electronically to a different pharmacy, notify our office through [facility] or by phone at [phone] option 4. ? ? ? ? ?Si Usted Necesita Algo Despu?s de Su Visita ? ?Tambi?n puede enviarnos un mensaje a trav?s de MyChart. Por lo general respondemos a los mensajes de MyChart en el transcurso de 1 a 2 d?as h?biles. ? ?Para renovar recetas, por favor pida a su farmacia que se ponga en contacto con nuestra oficina. Nuestro n?mero de fax es el [phone]. ? ?Si tiene un asunto urgente cuando la cl?nica est? cerrada y que no puede esperar hasta el siguiente d?a h?bil, puede llamar/localizar a su doctor(a) al n?mero que aparece a continuaci?n.  ? ?Por favor, tenga en cuenta que aunque hacemos todo lo posible para estar disponibles para asuntos urgentes fuera del horario de oficina, no estamos disponibles las 24 horas del d?a, los 7 d?as de la semana.  ? ?Si tiene un problema urgente y no puede comunicarse con nosotros, puede optar por buscar atenci?n m?dica  en el consultorio de su doctor(a), en una cl?nica privada, en un centro de atenci?n urgente o en una sala de emergencias. ? ?Si tiene Engineer, maintenance (IT) m?dica, por favor llame  inmediatamente al 911 o vaya a la sala de emergencias. ? ?N?meros de b?per ? ?- Dr. Nehemiah Massed: [phone] ? ?- Dra. Moye: [phone] ? ?- Dra. Nicole Kindred: [phone] ? ?En caso de inclemencias del tiempo, por favor llame a nuestra l?nea principal al [phone] para una actualizaci?n sobre el estado de cualquier retraso o cierre. ? ?Consejos para la medicaci?n en dermatolog?a: ?Por favor, guarde las cajas en las que vienen los medicamentos de uso t?pico para ayudarle a seguir las instrucciones sobre d?nde y c?mo usarlos. Las farmacias generalmente imprimen las instrucciones del medicamento s?lo en las cajas y no directamente en los tubos del [place].  ? ?Si su medicamento es muy caro, por favor, p?ngase en contacto con Zigmund Daniel llamando al [phone] y presione la opci?n 4 o env?enos un mensaje a trav?s de MyChart.  ? ?No podemos decirle cu?l ser? su copago por los medicamentos por adelantado ya que esto es diferente dependiendo de la cobertura de su seguro. Sin embargo,  es posible que podamos encontrar un medicamento sustituto a Electrical engineer un formulario para que el seguro cubra el medicamento que se considera necesario.  ? ?Si se requiere Ardelia Mems autorizaci?n previa para que su compa??a de seguros [place] su medicamento, por favor perm?tanos de 1 a 2 d?as h?biles para completar este proceso. ? ?Los precios de los medicamentos var?an con frecuencia dependiendo del Environmental consultant de d?nde se surte la receta y alguna farmacias pueden ofrecer precios m?s baratos. ? ?El sitio web www.goodrx.com tiene cupones para medicamentos de Airline pilot. Los precios aqu? no tienen en cuenta lo que podr?a costar con la ayuda del seguro (puede ser m?s barato con su seguro), pero el sitio web puede darle el precio si no utiliz? ning?n seguro.  ?- Puede imprimir el cup?n correspondiente y llevarlo con su receta a la farmacia.  ?- Tambi?n puede pasar por nuestra oficina durante el horario de atenci?n regular y recoger  una tarjeta de cupones de GoodRx.  ?- Si necesita que su receta se env?e electr?nicamente a Chiropodist, informe a nuestra oficina a trav?s de MyChart de [place] o por tel?fono llamando al

## 2021-09-09 NOTE — Progress Notes (Signed)
? ?  Follow-Up Visit ?  ?Subjective  ?Teresa Meyer is a 88 y.o. female who presents for the following: lesion (Recheck left ankle. H/O ISK treated with LN2 07/06/2021. Patient reports area has not healed). ? ? ? ?The following portions of the chart were reviewed this encounter and updated as appropriate:   ?  ? ?Review of Systems: No other skin or systemic complaints except as noted in HPI or Assessment and Plan. ? ? ?Objective  ?Well appearing patient in no apparent distress; mood and affect are within normal limits. ? ?A focused examination was performed including left leg. Relevant physical exam findings are noted in the Assessment and Plan. ? ?Left Ankle - Anterior ?1.5 cm pink scaly nodule ? ? ? ? ?B/L feet and ankles ?Stuck-on, waxy, white papules -- Discussed benign etiology and prognosis.  ? ? ?Assessment & Plan  ?Neoplasm of skin ?Left Ankle - Anterior ? ?Skin / nail biopsy ?Type of biopsy: tangential   ?Informed consent: discussed and consent obtained   ?Anesthesia: the lesion was anesthetized in a standard fashion   ?Anesthesia comment:  Area prepped with alcohol ?Anesthetic:  1% lidocaine w/ epinephrine 1-100,000 buffered w/ 8.4% NaHCO3 ?Instrument used: flexible razor blade   ?Hemostasis achieved with: pressure, aluminum chloride and electrodesiccation   ?Outcome: patient tolerated procedure well   ? ?Destruction of lesion ? ?Destruction method: electrodesiccation and curettage   ?Timeout:  patient name, date of birth, surgical site, and procedure verified ?Curettage performed in three different directions: Yes   ?Electrodesiccation performed over the curetted area: Yes   ?Final wound size (cm):  1.9 ?Hemostasis achieved with:  pressure, aluminum chloride and electrodesiccation ?Outcome: patient tolerated procedure well with no complications   ?Post-procedure details: wound care instructions given   ?Additional details:  Mupirocin ointment and Bandaid applied ?  ? ?mupirocin ointment (BACTROBAN) 2  % ?Apply once daily with bandage change. ? ?Specimen 1 - Surgical pathology ?Differential Diagnosis: IKS vs prurigo nodule vs SCC ? ?Check Margins: No ? ?Mupirocin: apply once daily with bandage change. ? ?Seborrheic keratosis ?B/L feet and ankles ? ?Reassured benign age-related growth.  Recommend observation.  Discussed cryotherapy if spot(s) become irritated or inflamed. ? ? ?Recommend starting moisturizer with exfoliant (Urea, Salicylic acid, or Lactic acid) one to two times daily to help smooth rough and bumpy skin.  OTC options include Cetaphil Rough and Bumpy lotion (Urea), Eucerin Roughness Relief lotion or spot treatment cream (Urea), CeraVe SA lotion/cream for Rough and Bumpy skin (Sal Acid), Gold Bond Rough and Bumpy cream (Sal Acid), and AmLactin 12% lotion/cream (Lactic Acid).  If applying in morning, also apply sunscreen to sun-exposed areas, since these exfoliating moisturizers can increase sensitivity to sun.  ? ?Actinic Damage ?- chronic, secondary to cumulative UV radiation exposure/sun exposure over time ?- diffuse scaly erythematous macules with underlying dyspigmentation ?- Recommend daily broad spectrum sunscreen SPF 30+ to sun-exposed areas, reapply every 2 hours as needed.  ?- Recommend staying in the shade or wearing long sleeves, sun glasses (UVA+UVB protection) and wide brim hats (4-inch brim around the entire circumference of the hat). ?- Call for new or changing lesions.  ? ?Return for TBSE As Scheduled. ? ?I, Emelia Salisbury, CMA, am acting as scribe for Brendolyn Patty, MD. ? ?Documentation: I have reviewed the above documentation for accuracy and completeness, and I agree with the above. ? ?Brendolyn Patty MD  ?

## 2021-09-14 ENCOUNTER — Telehealth: Payer: Self-pay

## 2021-09-14 NOTE — Telephone Encounter (Signed)
Advised pt's daughter Jeani Hawking of bx results./sh ?

## 2021-09-14 NOTE — Telephone Encounter (Signed)
-----   Message from Brendolyn Patty, MD sent at 09/14/2021  1:08 PM EDT ----- ?Skin , left ankle-anterior ?WELL DIFFERENTIATED SQUAMOUS CELL CARCINOMA, BASE INVOLVED ? ?SCC skin cancer- already treated with EDC at time of biopsy ?  - please call patient ?

## 2021-10-08 IMAGING — DX DG CHEST 1V
1 series · 1 of 1 positions shown · non-contrast
Comparison: 10/11/2017

CLINICAL DATA: Tachypnea

EXAM:
CHEST  1 VIEW

[chest ap]
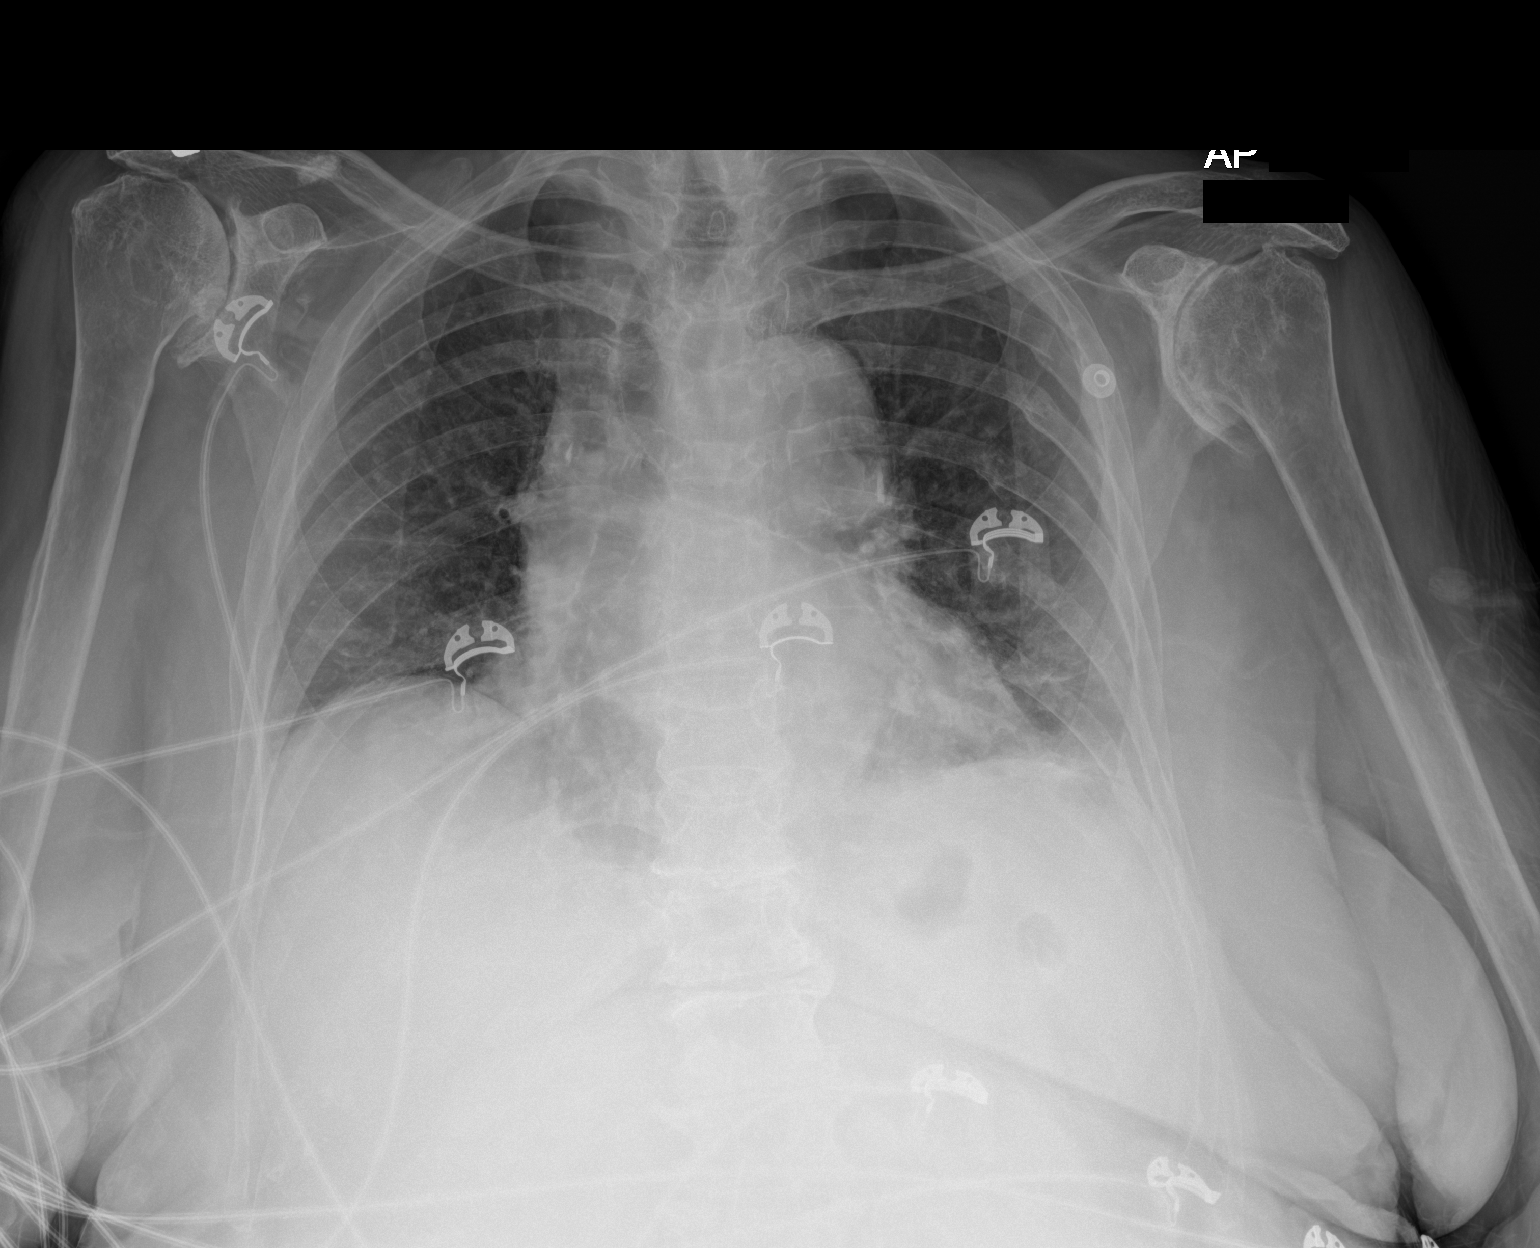

[1 of 1 positions shown; findings below may reference images not displayed]

FINDINGS: Cardiac shadow is stable. Aortic calcifications are again noted. The
overall inspiratory effort is poor with mild left basilar
atelectasis. No pneumothorax or effusion is seen. Old healed rib
fractures are noted on the left with degenerative changes of the
shoulder joints and thoracic spine.
IMPRESSION: Mild left basilar atelectasis.  No other focal abnormality is seen.

## 2021-11-11 DIAGNOSIS — K219 Gastro-esophageal reflux disease without esophagitis: Secondary | ICD-10-CM

## 2021-11-11 DIAGNOSIS — I10 Essential (primary) hypertension: Secondary | ICD-10-CM

## 2021-11-11 DIAGNOSIS — E43 Unspecified severe protein-calorie malnutrition: Secondary | ICD-10-CM | POA: Diagnosis not present

## 2021-11-11 DIAGNOSIS — K811 Chronic cholecystitis: Secondary | ICD-10-CM

## 2021-11-11 DIAGNOSIS — M1990 Unspecified osteoarthritis, unspecified site: Secondary | ICD-10-CM

## 2021-11-25 ENCOUNTER — Ambulatory Visit: Admission: RE | Admit: 2021-11-25 | Payer: TRICARE For Life (TFL) | Source: Ambulatory Visit | Admitting: Radiology

## 2021-12-01 ENCOUNTER — Ambulatory Visit (INDEPENDENT_AMBULATORY_CARE_PROVIDER_SITE_OTHER): Payer: Medicare Other | Admitting: Dermatology

## 2021-12-01 DIAGNOSIS — Z85828 Personal history of other malignant neoplasm of skin: Secondary | ICD-10-CM

## 2021-12-01 DIAGNOSIS — L853 Xerosis cutis: Secondary | ICD-10-CM | POA: Diagnosis not present

## 2021-12-01 DIAGNOSIS — Z872 Personal history of diseases of the skin and subcutaneous tissue: Secondary | ICD-10-CM | POA: Diagnosis not present

## 2021-12-01 MED ORDER — AMMONIUM LACTATE 12 % EX CREA: 1.0000 | TOPICAL_CREAM | CUTANEOUS | 2 refills | Status: DC | PRN

## 2021-12-01 NOTE — Progress Notes (Signed)
   Follow-Up Visit   Subjective  Teresa Meyer is a 88 y.o. female who presents for the following: Follow-up (Patient here today to recheck SCC at left ankle treated with [facility] and AK at right lateral lower lip. ).   The following portions of the chart were reviewed this encounter and updated as appropriate:       Review of Systems:  No other skin or systemic complaints except as noted in HPI or Assessment and Plan.  Objective  Well appearing patient in no apparent distress; mood and affect are within normal limits.  A focused examination was performed including face, lower legs. Relevant physical exam findings are noted in the Assessment and Plan.  Left Lower Leg Xerosis with thick waxy adherent scales BL pretibia, especially L mid pretibia    Assessment & Plan  Xerosis cutis Left Lower Leg  Severe xerosis Vs Sk's  Start ammonium lactate 12% cream twice daily to thickened areas at bilateral lower leg. Especially L pretibia   History of Squamous Cell Carcinoma of the Skin - No evidence of recurrence today L anterior ankle, small raised pink scaly papule lateral to biopsy site, appears separate. Observe. - Recommend regular full body skin exams - Recommend daily broad spectrum sunscreen SPF 30+ to sun-exposed areas, reapply every 2 hours as needed.  - Call if any new or changing lesions are noted between office visits  History of PreCancerous Actinic Keratosis  - site(s) of PreCancerous Actinic Keratosis clear today upper lip. - these may recur and new lesions may form requiring treatment to prevent transformation into skin cancer - observe for new or changing spots and contact [place] for appointment if occur - photoprotection with sun protective clothing; sunglasses and broad spectrum sunscreen with SPF of at least 30 + and frequent self skin exams recommended - yearly exams by a dermatologist recommended for persons with history of PreCancerous Actinic  Keratoses     Return in about 3 months (around 03/03/2022) for TBSE.  Graciella Belton, RMA, am acting as scribe for Brendolyn Patty, MD .  Documentation: I have reviewed the above documentation for accuracy and completeness, and I agree with the above.  Brendolyn Patty MD

## 2021-12-01 NOTE — Patient Instructions (Addendum)
Recommend ammonium lactate 12% cream to thickened areas at bilateral lower leg twice daily. May use at other areas of very dry skin.   Due to recent changes in healthcare laws, you may see results of your pathology and/or laboratory studies on MyChart before the doctors have had a chance to review them. We understand that in some cases there may be results that are confusing or concerning to you. Please understand that not all results are received at the same time and often the doctors may need to interpret multiple results in order to provide you with the best plan of care or course of treatment. Therefore, we ask that you please give [place] 2 business days to thoroughly review all your results before contacting the office for clarification. Should we see a critical lab result, you will be contacted sooner.   If You Need Anything After Your Visit  If you have any questions or concerns for your doctor, please call our main line at [phone] and press option 4 to reach your doctor's medical assistant. If no one answers, please leave a voicemail as directed and we will return your call as soon as possible. Messages left after 4 pm will be answered the following business day.   You may also send [place] a message via [place]. We typically respond to MyChart messages within 1-2 business days.  For prescription refills, please ask your pharmacy to contact our office. Our fax number is [phone].  If you have an urgent issue when the clinic is closed that cannot wait until the next business day, you can page your doctor at the number below.    Please note that while we do our best to be available for urgent issues outside of office hours, we are not available 24/7.   If you have an urgent issue and are unable to reach [place], you may choose to seek medical care at your doctor's office, retail clinic, urgent care center, or emergency room.  If you have a medical emergency, please immediately call 911 or go to  the emergency department.  Pager Numbers  - Dr. Nehemiah Massed: [phone]  - Dr. Laurence Ferrari: [phone]  - Dr. Nicole Kindred: [phone]  In the event of inclement weather, please call our main line at [phone] for an update on the status of any delays or closures.  Dermatology Medication Tips: Please keep the boxes that topical medications come in in order to help keep track of the instructions about where and how to use these. Pharmacies typically print the medication instructions only on the boxes and not directly on the medication tubes.   If your medication is too expensive, please contact our office at [phone] option 4 or send [place] a message through [place].   We are unable to tell what your co-pay for medications will be in advance as this is different depending on your insurance coverage. However, we may be able to find a substitute medication at lower cost or fill out paperwork to get insurance to cover a needed medication.   If a prior authorization is required to get your medication covered by your insurance company, please allow [place] 1-2 business days to complete this process.  Drug prices often vary depending on where the prescription is filled and some pharmacies may offer cheaper prices.  The website www.goodrx.com contains coupons for medications through different pharmacies. The prices here do not account for what the cost may be with help from insurance (it may be cheaper with your insurance), but the  website can give you the price if you did not use any insurance.  - You can print the associated coupon and take it with your prescription to the pharmacy.  - You may also stop by our office during regular business hours and pick up a GoodRx coupon card.  - If you need your prescription sent electronically to a different pharmacy, notify our office through [facility] or by phone at [phone] option 4.     Si Usted Necesita Algo Despus de Su Visita  Tambin puede  enviarnos un mensaje a travs de Pharmacist, community. Por lo general respondemos a los mensajes de MyChart en el transcurso de 1 a 2 das hbiles.  Para renovar recetas, por favor pida a su farmacia que se ponga en contacto con nuestra oficina. Harland Dingwall de fax es [place] [phone].  Si tiene un asunto urgente cuando la clnica est cerrada y que no puede esperar hasta el siguiente da hbil, puede llamar/localizar a su doctor(a) al nmero que aparece a continuacin.   Por favor, tenga en cuenta que aunque hacemos todo lo posible para estar disponibles para asuntos urgentes fuera del horario de [place], no estamos disponibles las 24 horas del da, los 7 das de la [place].   Si tiene un problema urgente y no puede comunicarse con nosotros, puede optar por buscar atencin mdica  en el consultorio de su doctor(a), en una clnica privada, en un centro de atencin urgente o en una sala de emergencias.  Si tiene Engineering geologist, por favor llame inmediatamente al 911 o vaya a la sala de emergencias.  Nmeros de bper  - Dr. Nehemiah Massed: [phone]  - Dra. Moye: [phone]  - Dra. Nicole Kindred: [phone]  En caso de inclemencias del [place], por favor llame a Johnsie Kindred principal al [phone] para una actualizacin sobre el [place] de cualquier retraso o cierre.  Consejos para la medicacin en dermatologa: Por favor, guarde las cajas en las que vienen los medicamentos de uso tpico para ayudarle a seguir las instrucciones sobre dnde y cmo usarlos. Las farmacias generalmente imprimen las instrucciones del medicamento slo en las cajas y no directamente en los tubos del [place].   Si su medicamento es muy caro, por favor, pngase en contacto con Zigmund Daniel llamando al [phone] y presione la opcin 4 o envenos un mensaje a travs de Pharmacist, community.   No podemos decirle cul ser su copago por los medicamentos por adelantado ya que esto es diferente dependiendo de la cobertura de su seguro.  Sin embargo, es posible que podamos encontrar un medicamento sustituto a Electrical engineer un formulario para que el seguro cubra el medicamento que se considera necesario.   Si se requiere una autorizacin previa para que su compaa de seguros [place] su medicamento, por favor permtanos de 1 a 2 das hbiles para completar este proceso.  Los precios de los medicamentos varan con frecuencia dependiendo del Environmental consultant de dnde se surte la receta y alguna farmacias pueden ofrecer precios ms baratos.  El sitio web www.goodrx.com tiene cupones para medicamentos de Airline pilot. Los precios aqu no tienen en cuenta lo que podra costar con la ayuda del seguro (puede ser ms barato con su seguro), pero el sitio web puede darle el precio si no utiliz Research scientist (physical sciences).  - Puede imprimir el cupn correspondiente y llevarlo con su receta a la farmacia.  - Tambin puede pasar por nuestra oficina durante el horario de atencin regular y Charity fundraiser una tarjeta de cupones de GoodRx.  -  Si necesita que su receta se enve electrnicamente a una farmacia diferente, informe a nuestra oficina a travs de MyChart de [place] o por telfono llamando al [phone] y presione la opcin 4.  

## 2021-12-04 ENCOUNTER — Ambulatory Visit
Admission: RE | Admit: 2021-12-04 | Discharge: 2021-12-04 | Disposition: A | Discharge status: Home / Self Care | Payer: Medicare Other | Source: Ambulatory Visit | Attending: Interventional Radiology | Admitting: Interventional Radiology

## 2021-12-04 ENCOUNTER — Other Ambulatory Visit: Payer: Self-pay | Admitting: Interventional Radiology

## 2021-12-04 DIAGNOSIS — K819 Cholecystitis, unspecified: Secondary | ICD-10-CM

## 2021-12-04 HISTORY — PX: IR EXCHANGE BILIARY DRAIN: IMG6046

## 2021-12-04 MED ORDER — LIDOCAINE HCL 1 % IJ SOLN
INTRAMUSCULAR | Status: AC
  Administered 2021-12-04: 5 mL
  Filled 2021-12-04: qty 20

## 2021-12-04 MED ORDER — IOHEXOL 350 MG/ML SOLN: 10.0000 mL | Freq: Once | INTRAVENOUS | Status: DC | PRN

## 2022-01-18 ENCOUNTER — Ambulatory Visit: Payer: TRICARE For Life (TFL) | Admitting: Dermatology

## 2022-01-18 DIAGNOSIS — K811 Chronic cholecystitis: Secondary | ICD-10-CM | POA: Diagnosis not present

## 2022-01-18 DIAGNOSIS — K219 Gastro-esophageal reflux disease without esophagitis: Secondary | ICD-10-CM | POA: Diagnosis not present

## 2022-01-18 DIAGNOSIS — I10 Essential (primary) hypertension: Secondary | ICD-10-CM | POA: Diagnosis not present

## 2022-01-18 DIAGNOSIS — M159 Polyosteoarthritis, unspecified: Secondary | ICD-10-CM | POA: Diagnosis not present

## 2022-02-05 ENCOUNTER — Non-Acute Institutional Stay (SKILLED_NURSING_FACILITY): Payer: Medicare Other | Admitting: Student

## 2022-02-05 ENCOUNTER — Encounter: Payer: Self-pay | Admitting: Student

## 2022-02-05 DIAGNOSIS — R600 Localized edema: Secondary | ICD-10-CM

## 2022-02-05 DIAGNOSIS — L299 Pruritus, unspecified: Secondary | ICD-10-CM | POA: Diagnosis not present

## 2022-02-05 NOTE — Progress Notes (Signed)
Location:   [facility] Room Number: 207 Place of Service:  SNF [phone]) Provider:  Dewayne Shorter, MD  Dewayne Shorter, MD  Patient Care Team: Dewayne Shorter, MD as PCP - General (Family Medicine) Rolm Bookbinder, MD as Consulting Physician (Dermatology) [name], MD as Consulting Physician (Orthopedic Surgery) [name], MD as Referring Physician (Ophthalmology) Kipp Laurence, MD as Referring Physician (Audiology) Wandra Mannan, DMD as Referring Physician (Dentistry)  Extended Emergency Contact Information Primary Emergency Contact: Cloyd Stagers of [place] Phone: [phone] Relation: Daughter Secondary Emergency Contact: Marcello Moores Mobile Phone: [phone] Relation: Daughter  Code Status:  DNR Goals of care: Advanced Directive information    02/05/2022    1:50 PM  Advanced Directives  Does Patient Have a Medical Advance Directive? Yes  Type of Paramedic of [place];Out of facility DNR (pink MOST or yellow form)  Does patient want to make changes to medical advance directive? No - Patient declined  Copy of [place] in Chart? Yes - validated most recent copy scanned in chart (See row information)     Chief Complaint  Patient presents with   Acute Visit    Toe itching    HPI:  Pt is a 88 y.o. female seen today for an acute visit for itching feet. Patient has had this issue for many months. States it is worse at night. It's worse after she has amlactin rubbed on her feet. Denies burning, tingling, or differing sensation. Issue is only in the left foot. She has an appointment with dermatology in 6 weeks.   Patient discussed her family with me. She had a son who is deceased and two daughter. Her first husband is deceased and her second husband as well. She is from [place]. She was a travel agent for 25 years. Nephew went to [employer]. She went to [employer]. College then went to [employer] in [place], [place]. Enjoys needle point. Previously enjoyed long walks and swimming.    Past Medical History:  Diagnosis Date   Arthritis    Atypical nevi 09/2014   lentiginous dysplastic nevus with mod atypia ([place])   Basal cell carcinoma 08/12/2017   R distal dorsum nose    Diverticulitis    DNR (do not resuscitate)    Esophageal stricture 2012   GERD (gastroesophageal reflux disease)    HLD (hyperlipidemia)    did not tolerate statin, stopped checking   HTN (hypertension)    Leg wound, left, subsequent encounter 11/12/2017   Malignant melanoma ([place]) 2015, 2017   R chin s/p excision ([place]) R upper back [name])   Malignant melanoma ([place]) [facility]/27/19 [facility]   R lat deltoid   Osteoporosis, post-menopausal    bisphosphonate caused dysphagia, requests defer DEXA for now   Seasonal allergies    Squamous cell carcinoma of skin 08/21/2018   L med leg below knee   Squamous cell carcinoma of skin 01/20/2017   R pretibial lateral   Squamous cell carcinoma of skin 09/25/2020   Left med lower leg above ankle - [facility]   Squamous cell carcinoma of skin 09/09/2021   L ankle, [facility]   Wears hearing aid    Past Surgical History:  Procedure Laterality Date   BUNIONECTOMY Right 2001   CARPAL TUNNEL RELEASE Bilateral 1974   CATARACT EXTRACTION Bilateral    [place]   IR CHOLANGIOGRAM EXISTING TUBE  04/04/2020   IR CHOLANGIOGRAM EXISTING TUBE  01/19/2021   IR EXCHANGE BILIARY DRAIN  02/26/2021   IR EXCHANGE BILIARY DRAIN  09/01/2021   IR EXCHANGE BILIARY DRAIN  12/04/2021   IR RADIOLOGIST EVAL & MGMT  01/22/2021   IR REMOVAL OF CALCULI/DEBRIS BILIARY DUCT/GB  02/26/2021   TONSILLECTOMY  1928    Allergies  Allergen Reactions   Other Rash and Other (See Comments)    Silk Suture - redness and severe pain   Bisphosphonates Other (See Comments)    Dysphagia to oral bisphosphonates   Codeine Nausea And Vomiting    Statins Other (See Comments)    Muscle aches   Sulfa Antibiotics     Can't recall reaction   Procaine Hcl Palpitations    Allergies as of 02/05/2022       Reactions   Other Rash, Other (See Comments)   Silk Suture - redness and severe pain   Bisphosphonates Other (See Comments)   Dysphagia to oral bisphosphonates   Codeine Nausea And Vomiting   Statins Other (See Comments)   Muscle aches   Sulfa Antibiotics    Can't recall reaction   Procaine Hcl Palpitations        Medication List        Accurate as of February 05, 2022  2:22 PM. If you have any questions, ask your nurse or doctor.          STOP taking these medications    calcium carbonate 500 MG chewable tablet Commonly known as: TUMS - dosed in mg elemental calcium Stopped by: Dewayne Shorter, MD   ketoconazole 2 % cream Commonly known as: NIZORAL Stopped by: Dewayne Shorter, MD   losartan 25 MG tablet Commonly known as: COZAAR Stopped by: Dewayne Shorter, MD   mupirocin ointment 2 % Commonly known as: BACTROBAN Stopped by: Dewayne Shorter, MD   ondansetron 4 MG tablet Commonly known as: ZOFRAN Stopped by: Dewayne Shorter, MD       TAKE these medications    acetaminophen 500 MG tablet Commonly known as: TYLENOL Take 500 mg by mouth every 4 (four) hours as needed for mild pain.   ammonium lactate 12 % cream Commonly known as: AMLACTIN Apply 1 Application topically as needed for dry skin.   bisacodyl 10 MG suppository Commonly known as: DULCOLAX Place 10 mg rectally daily as needed for moderate constipation.   carbamide peroxide 6.5 % OTIC solution Commonly known as: DEBROX 5 drops 2 (two) times daily.   Gold Bond Ult Rough/Bumpy Skin Crea Apply topically 2 (two) times daily. Apply to trunk and extremities topically every day and evening   ibuprofen 200 MG tablet Commonly known as: ADVIL Take 400 mg by mouth every 12 (twelve) hours as needed for mild pain.   Ilevro 0.3 % ophthalmic  suspension Generic drug: nepafenac Place 1 drop into the left eye daily.   magnesium hydroxide 400 MG/5ML suspension Commonly known as: MILK OF MAGNESIA Take 30 mLs by mouth every 12 (twelve) hours as needed for mild constipation.   polyethylene glycol 17 g packet Commonly known as: MIRALAX / GLYCOLAX Take 17 g by mouth daily as needed for moderate constipation.   prednisoLONE acetate 0.12 % ophthalmic suspension Commonly known as: PRED MILD Place 1 drop into the left eye daily.   SYSTANE COMPLETE OP Place 1 drop into the left eye every 8 (eight) hours as needed (dry eyes).   Vitamin D (Ergocalciferol) 1.25 MG (50000 UNIT) Caps capsule Commonly known as: DRISDOL Take 50,000 Units by mouth every 30 (thirty)  days. Give on the 4th of every month.        Review of Systems  All other systems reviewed and are negative.  Immunization History  Administered Date(s) Administered   Fluad Quad(high Dose 65+) 03/22/2019   Influenza,inj,Quad PF,6+ Mos 03/27/2013, 02/08/2017, 02/09/2018   Influenza-Unspecified 02/21/2014   Pneumococcal Polysaccharide-23 03/20/2012   Pertinent  Health Maintenance Due  Topic Date Due   INFLUENZA VACCINE  12/22/2021   DEXA SCAN  Completed      02/15/2020    9:00 AM 02/15/2020    9:49 AM 04/03/2020    9:55 AM 04/04/2020   10:58 AM 02/26/2021    7:29 AM  Fall Risk  Falls in the past year?   0    Was there an injury with Fall?   0    Fall Risk Category Calculator   0    Fall Risk Category   Low    Patient Fall Risk Level High fall risk High fall risk High fall risk High fall risk Moderate fall risk   Functional Status Survey:    Vitals:   02/05/22 1324  BP: 135/70  Pulse: 77  Resp: 18  Temp: (!) 97.5 F (36.4 C)  SpO2: 95%  Weight: 125 lb (56.7 kg)  Height: '5\' 3"'$  (1.6 m)   Body mass index is 22.14 kg/m. Physical Exam Constitutional:      Appearance: Normal appearance.  Cardiovascular:     Rate and Rhythm: Normal rate.  Pulmonary:      Effort: Pulmonary effort is normal.     Breath sounds: Normal breath sounds.  Musculoskeletal:     Comments: 1+ pitting edema of bilateral ankles.   Skin:    General: Skin is warm and dry.     Capillary Refill: Capillary refill takes 2 to 3 seconds.     Comments: No evidence of sounds or flaky skin.   Neurological:     Mental Status: She is alert.   Labs reviewed: Recent Labs    02/26/21 0800  NA 138  K 4.5  CL 105  CO2 23  GLUCOSE 92  BUN 16  CREATININE 0.86  CALCIUM 9.7   No results for input(s): "AST", "ALT", "ALKPHOS", "BILITOT", "PROT", "ALBUMIN" in the last 8760 hours. Recent Labs    02/26/21 0800  WBC 5.3  HGB 12.4  HCT 37.5  MCV 96.6  PLT 230   Lab Results  Component Value Date   TSH 2.721 10/11/2017   No results found for: "HGBA1C" Lab Results  Component Value Date   CHOL 271 (H) 09/17/2013   HDL 49.10 09/17/2013   LDLCALC 200 (H) 09/17/2013   TRIG 110.0 09/17/2013   CHOLHDL 6 09/17/2013    Significant Diagnostic Results in last 30 days:  No results found.  Assessment/Plan 1. Pedal edema Stable pedal edema. Encourage elevation when she is able.   2. Pruritus Patient has had an ongoing issue with itching of left greater than right toes at night. Visited patient to insure there was no infectious process to treat. No evidence of scabies or tenia at this time. Plan to avoid toes with Amlactin as patient is describing post-application irritation. Plan to keep appointment with dermatology Oct 2024  Family/ staff Communication: Staff updated with plan.   Labs/tests ordered:   None  Tomasa Rand, MD, [facility] [phone]

## 2022-02-24 ENCOUNTER — Other Ambulatory Visit: Payer: Self-pay | Admitting: Interventional Radiology

## 2022-02-24 ENCOUNTER — Ambulatory Visit
Admission: RE | Admit: 2022-02-24 | Discharge: 2022-02-24 | Disposition: A | Discharge status: Home / Self Care | Payer: Medicare Other | Source: Ambulatory Visit | Attending: Interventional Radiology | Admitting: Interventional Radiology

## 2022-02-24 DIAGNOSIS — K819 Cholecystitis, unspecified: Secondary | ICD-10-CM | POA: Diagnosis present

## 2022-02-24 HISTORY — PX: IR EXCHANGE BILIARY DRAIN: IMG6046

## 2022-02-24 MED ORDER — LIDOCAINE HCL 1 % IJ SOLN
INTRAMUSCULAR | Status: AC
  Administered 2022-02-24: 10 mL
  Filled 2022-02-24: qty 20

## 2022-02-24 MED ORDER — IOHEXOL 300 MG/ML  SOLN
4.0000 mL | Freq: Once | INTRAMUSCULAR | Status: AC | PRN
  Administered 2022-02-24: 4 mL

## 2022-02-26 ENCOUNTER — Ambulatory Visit: Payer: TRICARE For Life (TFL) | Admitting: Radiology

## 2022-03-10 ENCOUNTER — Non-Acute Institutional Stay: Payer: Medicare Other | Admitting: Student

## 2022-03-10 DIAGNOSIS — K82A1 Gangrene of gallbladder in cholecystitis: Secondary | ICD-10-CM

## 2022-03-10 DIAGNOSIS — Z515 Encounter for palliative care: Secondary | ICD-10-CM

## 2022-03-11 NOTE — Progress Notes (Signed)
[facility] Consult Note Telephone: [phone]  Fax: [phone]    Date of encounter: 03/10/22  PATIENT NAME: Teresa Meyer [address] [place] [address]   [phone] (home)  DOB: 1923-02-21 MRN: [id number] PRIMARY CARE PROVIDER:    Dewayne Shorter, MD,  [place] [address] [phone]  [place]:   Dewayne Shorter, MD [place],  [place] [address] [phone]  RESPONSIBLE PARTY:    Contact Information     Name Relation Home Work Mobile   Overton,[name] Daughter [phone]     Marcello Moores Daughter   [phone]   [facility] [phone]          I met face to face with patient in the facility. Palliative Care was asked to follow this patient by consultation request of  Dewayne Shorter, MD to address advance care planning and complex medical decision making. This is a follow up visit.                                   ASSESSMENT AND PLAN / RECOMMENDATIONS:   Advance Care Planning/Goals of Care: Goals include to maximize quality of life and symptom management. Patient/health care surrogate gave his/her permission to discuss.  CODE STATUS: DNR  Education provided on Palliative Medicine. Reviewed goals of care. Patient has been stable; we discussed signing off from palliative medicine at this time. Patient or staff declines any needs.  Symptom Management/Plan:  Gangrene cholecystitis-no acute symptoms. Patient last had biliary drain changed out 02/24/22. She has been stable. Staff to continue routine drain care. Patient to follow up with [facility] as scheduled for drain exchanges.   Follow up Palliative Care Visit: Palliative care will continue to follow for complex medical decision making, advance care planning, and clarification of goals. Return  prn.  I spent 25 minutes providing this consultation. More than 50% of the time in this consultation was  spent in counseling and care coordination.   PPS: 60%  HOSPICE ELIGIBILITY/DIAGNOSIS: TBD  Chief Complaint: Palliative Medicine follow up visit.   HISTORY OF PRESENT ILLNESS:  Teresa Meyer is a 88 y.o. year old female  with HTN, chronic venous insufficiency, cholecystitis with gangrenous gallbladder, biliary drain in place. Patient deemed a poor surgical candidate for cholecystectomy.  Resides at [facility]. Patient has been stable. She reports feeling well. She has her drain changed out routinely via [facility]. She denies pain, shortness of breath, nausea or constipation. She uses rollator walk for ambulation. She denies any functional declines. She is sleeping well. No symptom management needs expressed. We discussed discharge from Palliative services due to stability.    History obtained from review of EMR, discussion with primary team, and interview with family, facility staff/caregiver and/or Ms. Romaniello.  I reviewed available labs, medications, imaging, studies and related documents from the EMR.  Records reviewed and summarized above.   ROS  A 10-Point ROS is negative, except for the pertinent positives/negatives detailed per the HPI.  Physical Exam: Constitutional: NAD General: frail appearing EYES: anicteric sclera, lids intact, no discharge  ENMT: hearing impaired, oral mucous membranes moist, dentition intact CV: S1S2, RRR, trace LE edema Pulmonary: LCTA, no increased work of breathing, no cough, room air Abdomen: normo-active BS + 4 quadrants, soft and non tender GU: deferred MSK: moves all extremities, ambulatory with rollator walker Skin: warm and dry,  no rashes or wounds on visible skin Neuro:  no generalized weakness Psych: non-anxious affect, A and O x 3 Hem/lymph/immuno: no widespread bruising   Thank you for the opportunity to participate in the care of Teresa Meyer.  The palliative care team will continue to follow. Please call our office at [phone] if we can  be of additional assistance.   Ezekiel Slocumb, NP   COVID-19 PATIENT SCREENING TOOL Asked and negative response unless otherwise noted:   Have you had symptoms of covid, tested positive or been in contact with someone with symptoms/positive test in the past 5-10 days? No

## 2022-03-16 ENCOUNTER — Ambulatory Visit (INDEPENDENT_AMBULATORY_CARE_PROVIDER_SITE_OTHER): Payer: Medicare Other | Admitting: Dermatology

## 2022-03-16 DIAGNOSIS — C4492 Squamous cell carcinoma of skin, unspecified: Secondary | ICD-10-CM

## 2022-03-16 DIAGNOSIS — Z85820 Personal history of malignant melanoma of skin: Secondary | ICD-10-CM | POA: Diagnosis not present

## 2022-03-16 DIAGNOSIS — I872 Venous insufficiency (chronic) (peripheral): Secondary | ICD-10-CM | POA: Diagnosis not present

## 2022-03-16 DIAGNOSIS — L821 Other seborrheic keratosis: Secondary | ICD-10-CM

## 2022-03-16 DIAGNOSIS — L820 Inflamed seborrheic keratosis: Secondary | ICD-10-CM | POA: Diagnosis not present

## 2022-03-16 DIAGNOSIS — D485 Neoplasm of uncertain behavior of skin: Secondary | ICD-10-CM

## 2022-03-16 DIAGNOSIS — D0439 Carcinoma in situ of skin of other parts of face: Secondary | ICD-10-CM | POA: Diagnosis not present

## 2022-03-16 DIAGNOSIS — L578 Other skin changes due to chronic exposure to nonionizing radiation: Secondary | ICD-10-CM

## 2022-03-16 DIAGNOSIS — L814 Other melanin hyperpigmentation: Secondary | ICD-10-CM

## 2022-03-16 DIAGNOSIS — D229 Melanocytic nevi, unspecified: Secondary | ICD-10-CM

## 2022-03-16 DIAGNOSIS — Z85828 Personal history of other malignant neoplasm of skin: Secondary | ICD-10-CM

## 2022-03-16 DIAGNOSIS — Z1283 Encounter for screening for malignant neoplasm of skin: Secondary | ICD-10-CM

## 2022-03-16 HISTORY — DX: Squamous cell carcinoma of skin, unspecified: C44.92

## 2022-03-16 MED ORDER — GOLD BOND RAPID RELIEF 1-1 % EX CREA: TOPICAL_CREAM | CUTANEOUS | 5 refills | Status: DC

## 2022-03-16 NOTE — Progress Notes (Signed)
Follow-Up Visit   Subjective  Teresa Meyer is a 88 y.o. female who presents for the following: TBSE (Hx MM, SCC, BCC).  She has spots on legs that are bothersome.  Toes itch a lot at night.  Patient accompanied by patient's nephews wife, Jenny Reichmann who contributes to history.  The following portions of the chart were reviewed this encounter and updated as appropriate:       Review of Systems:  No other skin or systemic complaints except as noted in HPI or Assessment and Plan.  Objective  Well appearing patient in no apparent distress; mood and affect are within normal limits.  A full examination was performed including scalp, head, eyes, ears, nose, lips, neck, chest, axillae, abdomen, back, buttocks, bilateral upper extremities, bilateral lower extremities, hands, feet, fingers, toes, fingernails, and toenails. All findings within normal limits unless otherwise noted below.  L ant ankle x 1, L medial lower leg x 1, L lower calf x 2, R pretibia x 2, L elbow x 1 (7) Erythematous stuck-on, waxy papule or plaque  right mid cheek 1.0 cm pink crusted patch     lower legs Erythematous, scaly patches involving the ankle and distal lower leg with associated lower leg edema.     Assessment & Plan  Inflamed seborrheic keratosis (7) L ant ankle x 1, L medial lower leg x 1, L lower calf x 2, R pretibia x 2, L elbow x 1  Symptomatic, irritating, patient would like treated.  Vs hypertrophic AK  Discussed lower leg takes longer to heal  Destruction of lesion - L ant ankle x 1, L medial lower leg x 1, L lower calf x 2, R pretibia x 2, L elbow x 1  Destruction method: cryotherapy   Informed consent: discussed and consent obtained   Lesion destroyed using liquid nitrogen: Yes   Region frozen until ice ball extended beyond lesion: Yes   Outcome: patient tolerated procedure well with no complications   Post-procedure details: wound care instructions given   Additional details:  Prior to  procedure, discussed risks of blister formation, small wound, skin dyspigmentation, or rare scar following cryotherapy. Recommend Vaseline ointment to treated areas while healing.   Neoplasm of uncertain behavior of skin right mid cheek  Skin / nail biopsy Type of biopsy: tangential   Informed consent: discussed and consent obtained   Patient was prepped and draped in usual sterile fashion: Area prepped with alcohol. Anesthesia: the lesion was anesthetized in a standard fashion   Anesthetic:  1% lidocaine w/ epinephrine 1-100,000 buffered w/ 8.4% NaHCO3 Instrument used: flexible razor blade   Hemostasis achieved with: pressure, aluminum chloride and electrodesiccation   Outcome: patient tolerated procedure well   Post-procedure details: wound care instructions given   Post-procedure details comment:  Ointment and small bandage applied  Specimen 1 - Surgical pathology Differential Diagnosis: ISK r/o AK vs BCC  Check Margins: No 1.0 cm pink crusted patch  Venous stasis dermatitis of both lower extremities lower legs  Chronic and persistent condition with duration or expected duration over one year. Condition is symptomatic/ bothersome to patient. Not currently at goal.   Stasis in the legs causes chronic leg swelling, which may result in itchy or painful rashes, skin discoloration, skin texture changes, and sometimes ulceration.  Recommend daily graduated compression hose/stockings- easiest to put on first thing in morning, remove at bedtime.  Elevate legs as much as possible. Avoid salt/sodium rich foods.  Recommend compression stockings daily.   Recommend  Gold bond rapid relief cream to toes/feet prior to bed prn itching   History of Basal Cell Carcinoma of the Skin - No evidence of recurrence today nose - Recommend regular full body skin exams - Recommend daily broad spectrum sunscreen SPF 30+ to sun-exposed areas, reapply every 2 hours as needed.  - Call if any new or  changing lesions are noted between office visits  History of Squamous Cell Carcinoma of the Skin - No evidence of recurrence today including L ankle - Recommend regular full body skin exams - Recommend daily broad spectrum sunscreen SPF 30+ to sun-exposed areas, reapply every 2 hours as needed.  - Call if any new or changing lesions are noted between office visits   History of Melanoma - No evidence of recurrence today R lat.deltoid - Recommend regular full body skin exams - Recommend daily broad spectrum sunscreen SPF 30+ to sun-exposed areas, reapply every 2 hours as needed.  - Call if any new or changing lesions are noted between office visits  Lentigines - Scattered tan macules - Due to sun exposure - Benign-appearing, observe - Recommend daily broad spectrum sunscreen SPF 30+ to sun-exposed areas, reapply every 2 hours as needed. - Call for any changes  Seborrheic Keratoses - Stuck-on, waxy, tan-brown papules and/or plaques  - Benign-appearing - Discussed benign etiology and prognosis. - Observe - Call for any changes  Melanocytic Nevi - Tan-brown and/or pink-flesh-colored symmetric macules and papules - Benign appearing on exam today - Observation - Call clinic for new or changing moles - Recommend daily use of broad spectrum spf 30+ sunscreen to sun-exposed areas.   Hemangiomas - Red papules - Discussed benign nature - Observe - Call for any changes  Actinic Damage - Chronic condition, secondary to cumulative UV/sun exposure - diffuse scaly erythematous macules with underlying dyspigmentation - Recommend daily broad spectrum sunscreen SPF 30+ to sun-exposed areas, reapply every 2 hours as needed.  - Staying in the shade or wearing long sleeves, sun glasses (UVA+UVB protection) and wide brim hats (4-inch brim around the entire circumference of the hat) are also recommended for sun protection.  - Call for new or changing lesions.  Skin cancer screening performed  today.  Return in about 6 months (around 09/15/2022) for TBSE, sooner pending biopsy.  Graciella Belton, RMA, am acting as scribe for Brendolyn Patty, MD .  Documentation: I have reviewed the above documentation for accuracy and completeness, and I agree with the above.  Brendolyn Patty MD

## 2022-03-16 NOTE — Patient Instructions (Addendum)
Recommend OTC Gold Bond Rapid Relief Anti-Itch cream (pramoxine + menthol), CeraVe Anti-itch cream or lotion (pramoxine), Sarna lotion (Original- menthol + camphor or Sensitive- pramoxine) or Eucerin 12 hour Itch Relief lotion (menthol) up to 3 times per day to areas on body that are itchy.  Cryotherapy Aftercare  Wash gently with soap and water everyday.   Apply Vaseline and Band-Aid daily until healed.    Wound Care Instructions  Cleanse wound gently with soap and water once a day then pat dry with clean gauze. Apply a thin coat of Petrolatum (petroleum jelly, "Vaseline") over the wound (unless you have an allergy to this). We recommend that you use a new, sterile tube of Vaseline. Do not pick or remove scabs. Do not remove the yellow or white "healing tissue" from the base of the wound.  Cover the wound with fresh, clean, nonstick gauze and secure with paper tape. You may use Band-Aids in place of gauze and tape if the wound is small enough, but would recommend trimming much of the tape off as there is often too much. Sometimes Band-Aids can irritate the skin.  You should call the office for your biopsy report after 1 week if you have not already been contacted.  If you experience any problems, such as abnormal amounts of bleeding, swelling, significant bruising, significant pain, or evidence of infection, please call the office immediately.  Apply Vaseline to areas treat at lower legs and wrap with [place]  Due to recent changes in healthcare laws, you may see results of your pathology and/or laboratory studies on MyChart before the doctors have had a chance to review them. We understand that in some cases there may be results that are confusing or concerning to you. Please understand that not all results are received at the same time and often the doctors may need to interpret multiple results in order to provide you with the best plan of care or course of treatment. Therefore, we ask that you  please give [place] 2 business days to thoroughly review all your results before contacting the office for clarification. Should we see a critical lab result, you will be contacted sooner.   If You Need Anything After Your Visit  If you have any questions or concerns for your doctor, please call our main line at [phone] and press option 4 to reach your doctor's medical assistant. If no one answers, please leave a voicemail as directed and we will return your call as soon as possible. Messages left after 4 pm will be answered the following business day.   You may also send [place] a message via [place]. We typically respond to MyChart messages within 1-2 business days.  For prescription refills, please ask your pharmacy to contact our office. Our fax number is [phone].  If you have an urgent issue when the clinic is closed that cannot wait until the next business day, you can page your doctor at the number below.    Please note that while we do our best to be available for urgent issues outside of office hours, we are not available 24/7.   If you have an urgent issue and are unable to reach [place], you may choose to seek medical care at your doctor's office, retail clinic, urgent care center, or emergency room.  If you have a medical emergency, please immediately call 911 or go to the emergency department.  Pager Numbers  - Dr. Nehemiah Massed: [phone]  - Dr. Laurence Ferrari: [phone]  - Dr. Nicole Kindred:  [phone]  In the event of inclement weather, please call our main line at [phone] for an update on the status of any delays or closures.  Dermatology Medication Tips: Please keep the boxes that topical medications come in in order to help keep track of the instructions about where and how to use these. Pharmacies typically print the medication instructions only on the boxes and not directly on the medication tubes.   If your medication is too expensive, please contact our office at  [phone] option 4 or send [place] a message through [place].   We are unable to tell what your co-pay for medications will be in advance as this is different depending on your insurance coverage. However, we may be able to find a substitute medication at lower cost or fill out paperwork to get insurance to cover a needed medication.   If a prior authorization is required to get your medication covered by your insurance company, please allow [place] 1-2 business days to complete this process.  Drug prices often vary depending on where the prescription is filled and some pharmacies may offer cheaper prices.  The website www.goodrx.com contains coupons for medications through different pharmacies. The prices here do not account for what the cost may be with help from insurance (it may be cheaper with your insurance), but the website can give you the price if you did not use any insurance.  - You can print the associated coupon and take it with your prescription to the pharmacy.  - You may also stop by our office during regular business hours and pick up a GoodRx coupon card.  - If you need your prescription sent electronically to a different pharmacy, notify our office through [facility] or by phone at [phone] option 4.     Si Usted Necesita Algo Despus de Su Visita  Tambin puede enviarnos un mensaje a travs de Pharmacist, community. Por lo general respondemos a los mensajes de MyChart en el transcurso de 1 a 2 das hbiles.  Para renovar recetas, por favor pida a su farmacia que se ponga en contacto con nuestra oficina. Harland Dingwall de fax es [place] [phone].  Si tiene un asunto urgente cuando la clnica est cerrada y que no puede esperar hasta el siguiente da hbil, puede llamar/localizar a su doctor(a) al nmero que aparece a continuacin.   Por favor, tenga en cuenta que aunque hacemos todo lo posible para estar disponibles para asuntos urgentes fuera del horario de [place], no estamos  disponibles las 24 horas del da, los 7 das de la [place].   Si tiene un problema urgente y no puede comunicarse con nosotros, puede optar por buscar atencin mdica  en el consultorio de su doctor(a), en una clnica privada, en un centro de atencin urgente o en una sala de emergencias.  Si tiene Engineering geologist, por favor llame inmediatamente al 911 o vaya a la sala de emergencias.  Nmeros de bper  - Dr. Nehemiah Massed: [phone]  - Dra. Moye: [phone]  - Dra. Nicole Kindred: [phone]  En caso de inclemencias del [place], por favor llame a Johnsie Kindred principal al [phone] para una actualizacin sobre el [place] de cualquier retraso o cierre.  Consejos para la medicacin en dermatologa: Por favor, guarde las cajas en las que vienen los medicamentos de uso tpico para ayudarle a seguir las instrucciones sobre dnde y cmo usarlos. Las farmacias generalmente imprimen las instrucciones del medicamento slo en las cajas y no directamente en los tubos del [place].  Si su medicamento es muy caro, por favor, pngase en contacto con Zigmund Daniel llamando al [phone] y presione la opcin 4 o envenos un mensaje a travs de Pharmacist, community.   No podemos decirle cul ser su copago por los medicamentos por adelantado ya que esto es diferente dependiendo de la cobertura de su seguro. Sin embargo, es posible que podamos encontrar un medicamento sustituto a Electrical engineer un formulario para que el seguro cubra el medicamento que se considera necesario.   Si se requiere una autorizacin previa para que su compaa de seguros [place] su medicamento, por favor permtanos de 1 a 2 das hbiles para completar este proceso.  Los precios de los medicamentos varan con frecuencia dependiendo del Environmental consultant de dnde se surte la receta y alguna farmacias pueden ofrecer precios ms baratos.  El sitio web www.goodrx.com tiene cupones para medicamentos de Airline pilot. Los precios aqu no  tienen en cuenta lo que podra costar con la ayuda del seguro (puede ser ms barato con su seguro), pero el sitio web puede darle el precio si no utiliz Research scientist (physical sciences).  - Puede imprimir el cupn correspondiente y llevarlo con su receta a la farmacia.  - Tambin puede pasar por nuestra oficina durante el horario de atencin regular y Charity fundraiser una tarjeta de cupones de GoodRx.  - Si necesita que su receta se enve electrnicamente a una farmacia diferente, informe a nuestra oficina a travs de MyChart de [place] o por telfono llamando al [phone] y presione la opcin 4.

## 2022-03-19 ENCOUNTER — Encounter: Payer: Self-pay | Admitting: Student

## 2022-03-19 ENCOUNTER — Non-Acute Institutional Stay (SKILLED_NURSING_FACILITY): Payer: Medicare Other | Admitting: Student

## 2022-03-19 DIAGNOSIS — D509 Iron deficiency anemia, unspecified: Secondary | ICD-10-CM

## 2022-03-19 DIAGNOSIS — I10 Essential (primary) hypertension: Secondary | ICD-10-CM | POA: Diagnosis not present

## 2022-03-19 DIAGNOSIS — K219 Gastro-esophageal reflux disease without esophagitis: Secondary | ICD-10-CM

## 2022-03-19 DIAGNOSIS — Z85820 Personal history of malignant melanoma of skin: Secondary | ICD-10-CM

## 2022-03-19 DIAGNOSIS — Z66 Do not resuscitate: Secondary | ICD-10-CM

## 2022-03-19 DIAGNOSIS — K82A1 Gangrene of gallbladder in cholecystitis: Secondary | ICD-10-CM

## 2022-03-19 DIAGNOSIS — I872 Venous insufficiency (chronic) (peripheral): Secondary | ICD-10-CM | POA: Diagnosis not present

## 2022-03-19 DIAGNOSIS — M810 Age-related osteoporosis without current pathological fracture: Secondary | ICD-10-CM

## 2022-03-19 DIAGNOSIS — H9110 Presbycusis, unspecified ear: Secondary | ICD-10-CM

## 2022-03-19 DIAGNOSIS — K5730 Diverticulosis of large intestine without perforation or abscess without bleeding: Secondary | ICD-10-CM

## 2022-03-19 DIAGNOSIS — E7800 Pure hypercholesterolemia, unspecified: Secondary | ICD-10-CM

## 2022-03-19 DIAGNOSIS — R600 Localized edema: Secondary | ICD-10-CM

## 2022-03-19 NOTE — Progress Notes (Unsigned)
Location:  [facility] Room Number: 207-A Place of Service:  SNF [phone]) Provider:  Dewayne Shorter, MD  Patient Care Team: Dewayne Shorter, MD as PCP - General (Family Medicine) Rolm Bookbinder, MD as Consulting Physician (Dermatology) [name], MD as Consulting Physician (Orthopedic Surgery) [name], MD as Referring Physician (Ophthalmology) Kipp Laurence, MD as Referring Physician (Audiology) Wandra Mannan, DMD as Referring Physician (Dentistry)  Extended Emergency Contact Information Primary Emergency Contact: Cloyd Stagers of [place] Phone: [phone] Relation: Daughter Secondary Emergency Contact: Marcello Moores Mobile Phone: [phone] Relation: Daughter  Code Status:  DNR Goals of care: Advanced Directive information    03/19/2022    9:37 AM  Advanced Directives  Does Patient Have a Medical Advance Directive? Yes  Type of Paramedic of [place];Out of facility DNR (pink MOST or yellow form)  Does patient want to make changes to medical advance directive? No - Patient declined  Copy of [place] in Chart? Yes - validated most recent copy scanned in chart (See row information)  Pre-existing out of facility DNR order (yellow form or pink MOST form) Yellow form placed in chart (order not valid for inpatient use)     Chief Complaint  Patient presents with   Medical Management of Chronic Issues    Routine visit. Discuss need for covid boosters, shingrix, and PCV, or post pone if patient refuses.     HPI:  Pt is a 88 y.o. female seen today for medical management of chronic diseases.  She states she is doing well and has no acute concerns. She walks to the [place] and back. she is in teh bed for 12 hours every night and thinks she sleeps most of that time frame.   She went to the dermatologist recently. her biggest issue is that the inside of her mouth is a  little raspy. She doesn't think it has to do with water intake. The roof of her mouth is   Her only issue with the ostomy is that it's there. She is eating well. Having good urination and stool output. Denies chest pain or shortness of breath. She enjoyed the fall festival yesterday. She has daughter in [place] and [place]. Son died 29 years ago of pancreatic cancer. She talks to kids daily.    Past Medical History:  Diagnosis Date   Arthritis    Atypical nevi 09/2014   lentiginous dysplastic nevus with mod atypia ([place])   Basal cell carcinoma 08/12/2017   R distal dorsum nose    Diverticulitis    DNR (do not resuscitate)    Esophageal stricture 2012   GERD (gastroesophageal reflux disease)    HLD (hyperlipidemia)    did not tolerate statin, stopped checking   HTN (hypertension)    Leg wound, left, subsequent encounter 11/12/2017   Malignant melanoma ([place]) 2015, 2017   R chin s/p excision ([place]) R upper back [name])   Malignant melanoma ([place]) 07/27/19 [facility]LE [facility]   R lat deltoid   Osteoporosis, post-menopausal    bisphosphonate caused dysphagia, requests defer DEXA for now   Seasonal allergies    Squamous cell carcinoma of skin 08/21/2018   L med leg below knee   Squamous cell carcinoma of skin 01/20/2017   R pretibial lateral   Squamous cell carcinoma of skin 09/25/2020   Left med lower leg above ankle - [facility]   Squamous cell carcinoma of skin 09/09/2021   L ankle, [facility]  Wears hearing aid    Past Surgical History:  Procedure Laterality Date   BUNIONECTOMY Right 2001   CARPAL TUNNEL RELEASE Bilateral 1974   CATARACT EXTRACTION Bilateral    [place]   IR CHOLANGIOGRAM EXISTING TUBE  04/04/2020   IR CHOLANGIOGRAM EXISTING TUBE  01/19/2021   IR EXCHANGE BILIARY DRAIN  02/26/2021   IR EXCHANGE BILIARY DRAIN  09/01/2021   IR EXCHANGE BILIARY DRAIN  12/04/2021   IR EXCHANGE BILIARY DRAIN  02/24/2022   IR RADIOLOGIST EVAL & MGMT  01/22/2021   IR REMOVAL OF CALCULI/DEBRIS  BILIARY DUCT/GB  02/26/2021   TONSILLECTOMY  1928    Allergies  Allergen Reactions   Other Rash and Other (See Comments)    Silk Suture - redness and severe pain   Bisphosphonates Other (See Comments)    Dysphagia to oral bisphosphonates   Codeine Nausea And Vomiting   Statins Other (See Comments)    Muscle aches   Sulfa Antibiotics     Can't recall reaction   Procaine Hcl Palpitations    Outpatient Encounter Medications as of 03/19/2022  Medication Sig   acetaminophen (TYLENOL) 500 MG tablet Take 500 mg by mouth every 4 (four) hours as needed for mild pain.   ammonium lactate (AMLACTIN) 12 % cream Apply 1 Application topically as needed for dry skin.   bisacodyl (DULCOLAX) 10 MG suppository Place 10 mg rectally daily as needed for moderate constipation.   carbamide peroxide (DEBROX) 6.5 % OTIC solution Place 5 drops into both ears as needed.   Emollient (GOLD BOND ULT ROUGH/BUMPY SKIN) CREA Apply topically 2 (two) times daily. Apply to trunk and extremities topically every day and evening   ibuprofen (ADVIL) 200 MG tablet Take 400 mg by mouth every 12 (twelve) hours as needed for mild pain.   magnesium hydroxide (MILK OF MAGNESIA) 400 MG/5ML suspension Take 30 mLs by mouth every 12 (twelve) hours as needed for mild constipation.   nepafenac (ILEVRO) 0.3 % ophthalmic suspension Place 1 drop into the left eye daily.    polyethylene glycol (MIRALAX / GLYCOLAX) 17 g packet Take 17 g by mouth daily as needed for moderate constipation.   Pramoxine-Menthol (GOLD BOND RAPID RELIEF) 1-1 % CREA Apply to feet, toes and other affected areas as needed for itch.   prednisoLONE acetate (PRED MILD) 0.12 % ophthalmic suspension Place 1 drop into the left eye daily.    Propylene Glycol (SYSTANE COMPLETE OP) Place 1 drop into the left eye every 8 (eight) hours as needed (dry eyes).   Vitamin D, Ergocalciferol, (DRISDOL) 1.25 MG (50000 UNIT) CAPS capsule Take 50,000 Units by mouth every 30 (thirty) days.  Give on the 4th of every month.   No facility-administered encounter medications on file as of 03/19/2022.    Review of Systems  All other systems reviewed and are negative.   Immunization History  Administered Date(s) Administered   Fluad Quad(high Dose 65+) 03/22/2019   Influenza, High Dose Seasonal PF 03/09/2022   Influenza,inj,Quad PF,6+ Mos 03/27/2013, 02/08/2017, 02/09/2018   Influenza-Unspecified 02/21/2014   Moderna Covid-19 Vaccine Bivalent Booster 72yr & up 02/13/2021   Moderna SARS-COV2 Booster Vaccination 08/20/2020   Moderna Sars-Covid-2 Vaccination 06/05/2019, 07/03/2019   Pfizer Covid-19 Vaccine Bivalent Booster 179yr& up 02/13/2021   Pneumococcal Polysaccharide-23 03/20/2012   Pertinent  Health Maintenance Due  Topic Date Due   INFLUENZA VACCINE  Completed   DEXA SCAN  Completed      02/15/2020    9:00 AM 02/15/2020  9:49 AM 04/03/2020    9:55 AM 04/04/2020   10:58 AM 02/26/2021    7:29 AM  Fall Risk  Falls in the past year?   0    Was there an injury with Fall?   0    Fall Risk Category Calculator   0    Fall Risk Category   Low    Patient Fall Risk Level High fall risk High fall risk High fall risk High fall risk Moderate fall risk   Functional Status Survey:    Vitals:   03/19/22 0931  BP: (!) 144/82  Pulse: 73  Temp: (!) 97.1 F (36.2 C)  SpO2: 94%  Weight: 127 lb 6.4 oz (57.8 kg)  Height: '5\' 3"'$  (1.6 m)   Body mass index is 22.57 kg/m. Physical Exam Constitutional:      Appearance: Normal appearance.  Cardiovascular:     Rate and Rhythm: Normal rate and regular rhythm.     Pulses: Normal pulses.     Heart sounds: Normal heart sounds.  Pulmonary:     Effort: Pulmonary effort is normal.     Breath sounds: Normal breath sounds.  Abdominal:     General: Abdomen is flat. Bowel sounds are normal.     Palpations: Abdomen is soft.     Comments: Ostomy bandage clean/dry/intact. Output is normal color and consistency  Skin:    General:  Skin is warm and dry.     Comments: Uses rollator for ambulation   Neurological:     General: No focal deficit present.     Mental Status: She is alert and oriented to person, place, and time. Mental status is at baseline.     Labs reviewed: No results for input(s): "NA", "K", "CL", "CO2", "GLUCOSE", "BUN", "CREATININE", "CALCIUM", "MG", "PHOS" in the last 8760 hours. No results for input(s): "AST", "ALT", "ALKPHOS", "BILITOT", "PROT", "ALBUMIN" in the last 8760 hours. No results for input(s): "WBC", "NEUTROABS", "HGB", "HCT", "MCV", "PLT" in the last 8760 hours. Lab Results  Component Value Date   TSH 2.721 10/11/2017   No results found for: "HGBA1C" Lab Results  Component Value Date   CHOL 271 (H) 09/17/2013   HDL 49.10 09/17/2013   LDLCALC 200 (H) 09/17/2013   TRIG 110.0 09/17/2013   CHOLHDL 6 09/17/2013    Significant Diagnostic Results in last 30 days:  IR EXCHANGE BILIARY DRAIN  Result Date: 02/24/2022 INDICATION: Chronic indwelling cholecystostomy tube From previous exchange notes, "88 year old female with history of cholelithiasis and choledocholithiasis with chronic indwelling cholecystostomy drain status post choledochoscope-assisted percutaneous gallstone retrieval on 02/26/2021 which was largely successful. The patient has persistent indwelling cholecystostomy tube as she has previously stated she does not want any further procedures done and does not mind having the tube in place. She presents today for routine check and exchange." EXAM: Exchange of percutaneous cholecystostomy tube using fluoroscopic guidance MEDICATIONS: None ANESTHESIA/SEDATION: Local analgesia FLUOROSCOPY: Radiation Exposure Index (as provided by the fluoroscopic device): 1.0 minutes (6 mGy) COMPLICATIONS: None immediate. PROCEDURE: Informed written consent was obtained from the patient after a thorough discussion of the procedural risks, benefits and alternatives. All questions were addressed. Maximal  Sterile Barrier Technique was utilized including caps, mask, sterile gowns, sterile gloves, sterile drape, hand hygiene and skin antiseptic. A timeout was performed prior to the initiation of the procedure. The patient was placed supine on the exam table. The right abdomen was prepped and draped in the standard sterile fashion with inclusion of the existing percutaneous cholecystostomy tube within  the sterile field. Injection of the percutaneous cholecystostomy tube demonstrated appropriate location within the gallbladder lumen. Cystic duct is noted to be patent. Over an Amplatz wire, the locking loop was released and the existing cholecystostomy tube was exchanged for a new 14 French locking cholecystostomy tube. Locking loop was formed within the gallbladder lumen, and location was again confirmed with injection of contrast material. This again demonstrated a patent cystic duct. A small filling defect in the distal common bile duct is identified, compatible with previously identified choledocholithiasis. The drainage catheter was secured to the skin using a Prolene suture and a dressing. It was attached to bag drainage. The patient tolerated the procedure well without immediate complication. IMPRESSION: 1. Successful exchange of percutaneous cholecystostomy tube for a new, similar 14 French cholecystostomy tube. Cholecystostomy tube placed to bag drainage. 2. As previously noted, a small gallstone remains in the distal common bile duct compatible with choledocholithiasis. Given these findings, recommend continued chronic drainage of the gallbladder at this time. Patient will be scheduled for routine check and change in 12 weeks. Electronically Signed   By: Albin Felling M.D.   On: 02/24/2022 12:20    Assessment/Plan 1. Essential hypertension BP well controlled on no medication at this time.  We will continue to monitor.  Goal is range between 120 and less than 150 given patient's age and history of  falls.  2. Chronic venous insufficiency Patient with trace pedal edema bilaterally.  Intermittently wears compression stockings.  Goes on daily walks to increased blood flow.  Continue patient's current regimen with AmLactin to the bilateral legs and feet per dermatology.  3. Gastroesophageal reflux disease, unspecified whether esophagitis present Patient symptoms are well controlled with over-the-counter Tums will continue as needed.  4. Diverticulosis of colon History of diverticulosis without evidence of diverticulitis.  Patient continues to have normal bowel movements and no blood in stool.  Continue to monitor.  5. Presbycusis, unspecified laterality Patient with significant hearing loss wears hearing aids regularly.  6. Osteoporosis, post-menopausal Patient with history of osteoporosis without recent falls.  Deferring treatment based on age and goals of care.  Patient continues to require daily monitoring for falls continue rollator use.  7. HYPERCHOLESTEROLEMIA Patient's cholesterol medications discontinued per goals of care no indication to continue to monitor.  8. Iron deficiency anemia, unspecified iron deficiency anemia type Patient with history of iron deficiency anemia, will order CBC with annual labs.  9. Pedal edema Patient with bilateral trace pedal edema.  Intermittent use of compression stockings.  Continue daily ambulation with rollator.  No intervention at this time  10. History of malignant melanoma Patient followed by dermatology with regular visits.  Most recent visit on 10/24 Dr. Nicole Kindred showed no evidence of recurrent melanoma per her annual exam.  We will continue to monitor  11. DNR (do not resuscitate) Patient maintains she would not like CPR or intervention in the event she has cardiac arrest.  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  Patient with history of gangrenous cholecystitis status post drain placement in 2021.  Based on goals of care and patient being a  poor surgical candidate, surgery interventions were deferred and she continues to have a drain in place.  Drain is changed periodically with surgical specialty and most recently changed 02/24/2022  Family/ staff Communication: nursing staff  Labs/tests ordered:  CBC, [place], MD, [place] [phone]

## 2022-03-21 ENCOUNTER — Encounter: Payer: Self-pay | Admitting: Student

## 2022-03-21 DIAGNOSIS — Z8719 Personal history of other diseases of the digestive system: Secondary | ICD-10-CM | POA: Insufficient documentation

## 2022-03-22 ENCOUNTER — Telehealth: Payer: Self-pay

## 2022-03-22 NOTE — Telephone Encounter (Signed)
-----   Message from Brendolyn Patty, MD sent at 03/22/2022 10:23 AM EDT ----- Skin , right mid cheek SQUAMOUS CELL CARCINOMA IN SITU  SCCIS- recommend topical chemotherapy.  5FU/Vit.D cream twice daily for 2 weeks to entire red scaly area on L cheek, avoid getting in eye.  Can send compound to [facility] in [place] or can send to [facility] (will come in mail).  Neither covered by insurance, but are around $50.   - please call patient  5-fluorouracil/calcipotriene cream is is a type of field treatment used to treat precancers, thin skin cancers, and areas of sun damage. Expected reaction includes irritation and mild inflammation potentially progressing to more severe inflammation including redness, scaling, crusting and open sores/erosions.  If too much irritation occurs, ensure application of only a thin layer and decrease frequency of use to achieve a tolerable level of inflammation. Recommend applying Vaseline ointment to open sores as needed.  Minimize sun exposure while under treatment. Recommend daily broad spectrum sunscreen SPF 30+ to sun-exposed areas, reapply every 2 hours as needed.

## 2022-03-22 NOTE — Telephone Encounter (Signed)
Left pt's daughter message to call for bx results/sh

## 2022-03-29 NOTE — Telephone Encounter (Signed)
Left another message for patients daughter to call for patients bx result./sh

## 2022-03-31 ENCOUNTER — Telehealth: Payer: Self-pay

## 2022-03-31 NOTE — Telephone Encounter (Signed)
Left message for daughter, Jeani Hawking, to call and discuss biopsy results and treatment needed.

## 2022-03-31 NOTE — Telephone Encounter (Signed)
-----   Message from Brendolyn Patty, MD sent at 03/22/2022 10:23 AM EDT ----- Skin , right mid cheek SQUAMOUS CELL CARCINOMA IN SITU  SCCIS- recommend topical chemotherapy.  5FU/Vit.D cream twice daily for 2 weeks to entire red scaly area on L cheek, avoid getting in eye.  Can send compound to [facility] in [place] or can send to [facility] (will come in mail).  Neither covered by insurance, but are around $50.   - please call patient  5-fluorouracil/calcipotriene cream is is a type of field treatment used to treat precancers, thin skin cancers, and areas of sun damage. Expected reaction includes irritation and mild inflammation potentially progressing to more severe inflammation including redness, scaling, crusting and open sores/erosions.  If too much irritation occurs, ensure application of only a thin layer and decrease frequency of use to achieve a tolerable level of inflammation. Recommend applying Vaseline ointment to open sores as needed.  Minimize sun exposure while under treatment. Recommend daily broad spectrum sunscreen SPF 30+ to sun-exposed areas, reapply every 2 hours as needed.

## 2022-03-31 NOTE — Telephone Encounter (Signed)
Patients daughter notified of bx results and of treatment recommended by Dr .Nicole Kindred. Patient's daughter preferred [facility] so that was sent in to them today. The daughter lives in [place] and is planning on having them mail the medication to [facility] where the patient lives and have nurse Caryl Pina apply it for the patient. Patient was encouraged to have ashley call us if she has any questions or concerns about application or medication reactions after use.

## 2022-04-06 ENCOUNTER — Telehealth: Payer: Self-pay

## 2022-04-06 NOTE — Telephone Encounter (Signed)
Teresa Meyer from [facility] called office and left nurse VM wanting orders for cream given to daughter.  Called and spoke with second shift nurse and give orders and instructions for patients medication. aw

## 2022-04-20 ENCOUNTER — Encounter: Payer: Self-pay | Admitting: Nurse Practitioner

## 2022-04-20 ENCOUNTER — Non-Acute Institutional Stay (SKILLED_NURSING_FACILITY): Payer: Medicare Other | Admitting: Nurse Practitioner

## 2022-04-20 DIAGNOSIS — R234 Changes in skin texture: Secondary | ICD-10-CM | POA: Diagnosis not present

## 2022-04-20 NOTE — Progress Notes (Signed)
Location:  Other [facility]) Nursing Home Room Number: 207-A Place of Service:  SNF [phone]) Provider:  Carlos American. Dewaine Oats, NP   Patient Care Team: Dewayne Shorter, MD as PCP - General (Family Medicine) Rolm Bookbinder, MD as Consulting Physician (Dermatology) [name], MD as Consulting Physician (Orthopedic Surgery) [name], MD as Referring Physician (Ophthalmology) Kipp Laurence, MD as Referring Physician (Audiology) Wandra Mannan, DMD as Referring Physician (Dentistry)  Extended Emergency Contact Information Primary Emergency Contact: Cloyd Stagers of [place] Phone: [phone] Relation: Daughter Secondary Emergency Contact: Marcello Moores Mobile Phone: [phone] Relation: Daughter  Code Status:  DNR Goals of care: Advanced Directive information    03/19/2022    9:37 AM  Advanced Directives  Does Patient Have a Medical Advance Directive? Yes  Type of Paramedic of [place];Out of facility DNR (pink MOST or yellow form)  Does patient want to make changes to medical advance directive? No - Patient declined  Copy of [place] in Chart? Yes - validated most recent copy scanned in chart (See row information)  Pre-existing out of facility DNR order (yellow form or pink MOST form) Yellow form placed in chart (order not valid for inpatient use)     Chief Complaint  Patient presents with   Acute Visit    Scabbed area. Vitals and medications are a reflection of [facility]MR system, [facility]      HPI:  Pt is a 88 y.o. female seen today for an acute visit for nonhealing scabbed area on left lower leg.  Pt went to dermatologist last month and had cryotherapy to seborrheic keratosis and area continues to be red, tender and scabbed over. Nursing requested this be evaluated   Past Medical History:  Diagnosis Date   Acute gangrenous cholecystitis 02/12/2020   Arthritis     Atypical nevi 09/2014   lentiginous dysplastic nevus with mod atypia ([place])   Basal cell carcinoma 08/12/2017   R distal dorsum nose    Cholecystitis with gangrene of gallbladder    Diverticulitis    DNR (do not resuscitate)    Esophageal stricture 2012   GERD (gastroesophageal reflux disease)    HLD (hyperlipidemia)    did not tolerate statin, stopped checking   HTN (hypertension)    Hypokalemia    Hypomagnesemia    Hyponatremia    Leg wound, left, subsequent encounter 11/12/2017   Malignant melanoma ([place]) 2015, 2017   R chin s/p excision ([place]) R upper back [name])   Malignant melanoma ([place]) 07/27/19 WLE at [facility]   R lat deltoid   Multiple rib fractures 11/02/2017   Osteoporosis, post-menopausal    bisphosphonate caused dysphagia, requests defer DEXA for now   SCC (squamous cell carcinoma) 03/16/2022   R mid cheek, txt with 79fu/calcipotriene   Seasonal allergies    Sepsis ([place] 02/12/2020   Skin lesion 08/17/2018   Squamous cell carcinoma of skin 08/21/2018   L med leg below knee   Squamous cell carcinoma of skin 01/20/2017   R pretibial lateral   Squamous cell carcinoma of skin 09/25/2020   Left med lower leg above ankle - EDC   Squamous cell carcinoma of skin 09/09/2021   L ankle, EDC   Wears hearing aid    Past Surgical History:  Procedure Laterality Date   BUNIONECTOMY Right 2001   CARPAL TUNNEL RELEASE Bilateral 1974   CATARACT EXTRACTION Bilateral    [place]   IR CHOLANGIOGRAM EXISTING TUBE  04/04/2020   IR CHOLANGIOGRAM EXISTING TUBE  01/19/2021   IR EXCHANGE BILIARY DRAIN  02/26/2021   IR EXCHANGE BILIARY DRAIN  09/01/2021   IR EXCHANGE BILIARY DRAIN  12/04/2021   IR EXCHANGE BILIARY DRAIN  02/24/2022   IR RADIOLOGIST EVAL & MGMT  01/22/2021   IR REMOVAL OF CALCULI/DEBRIS BILIARY DUCT/GB  02/26/2021   TONSILLECTOMY  1928    Allergies  Allergen Reactions   Other Rash and Other (See Comments)    Silk Suture - redness and severe pain   Bisphosphonates  Other (See Comments)    Dysphagia to oral bisphosphonates   Codeine Nausea And Vomiting   Statins Other (See Comments)    Muscle aches   Sulfa Antibiotics     Can't recall reaction   Procaine Hcl Palpitations    Outpatient Encounter Medications as of 04/20/2022  Medication Sig   acetaminophen (TYLENOL) 500 MG tablet Take 500 mg by mouth every 4 (four) hours as needed for mild pain.   ammonium lactate (AMLACTIN) 12 % cream Apply 1 Application topically as needed for dry skin.   bisacodyl (DULCOLAX) 10 MG suppository Place 10 mg rectally daily as needed for moderate constipation.   carbamide peroxide (DEBROX) 6.5 % OTIC solution Place 5 drops into both ears as needed.   Emollient (GOLD BOND ULT ROUGH/BUMPY SKIN) CREA Apply topically 2 (two) times daily. Apply to trunk and extremities topically every day and evening   magnesium hydroxide (MILK OF MAGNESIA) 400 MG/5ML suspension Take 30 mLs by mouth every 12 (twelve) hours as needed for mild constipation.   Menthol, Topical Analgesic, (EUCERIN ITCH RELIEF) 0.1 % LOTN Apply 1 Application topically every 8 (eight) hours as needed (For itching).   nepafenac (ILEVRO) 0.3 % ophthalmic suspension Place 1 drop into the left eye daily.    polyethylene glycol (MIRALAX / GLYCOLAX) 17 g packet Take 17 g by mouth daily as needed for moderate constipation.   Pramoxine-Menthol (GOLD BOND RAPID RELIEF) 1-1 % CREA Apply to feet, toes and other affected areas as needed for itch.   prednisoLONE acetate (PRED MILD) 0.12 % ophthalmic suspension Place 1 drop into the left eye daily.    Propylene Glycol (SYSTANE COMPLETE OP) Place 1 drop into the left eye every 8 (eight) hours as needed (dry eyes).   Vitamin D, Ergocalciferol, (DRISDOL) 1.25 MG (50000 UNIT) CAPS capsule Take 50,000 Units by mouth every 30 (thirty) days. Give on the 4th of every month.   No facility-administered encounter medications on file as of 04/20/2022.    Review of Systems  Cardiovascular:   Positive for leg swelling.  Skin:        Nonhealing area to lower leg.     Immunization History  Administered Date(s) Administered   Fluad Quad(high Dose 65+) 03/22/2019   Influenza, High Dose Seasonal PF 03/09/2022   Influenza,inj,Quad PF,6+ Mos 03/27/2013, 02/08/2017, 02/09/2018   Influenza-Unspecified 02/21/2014   Moderna Covid-19 Vaccine Bivalent Booster 54yr & up 02/13/2021   Moderna SARS-COV2 Booster Vaccination 08/20/2020   Moderna Sars-Covid-2 Vaccination 06/05/2019, 07/03/2019   Pfizer Covid-19 Vaccine Bivalent Booster 156yr& up 02/13/2021   Pneumococcal Polysaccharide-23 03/20/2012   Pertinent  Health Maintenance Due  Topic Date Due   INFLUENZA VACCINE  Completed   DEXA SCAN  Completed      02/15/2020    9:00 AM 02/15/2020    9:49 AM 04/03/2020    9:55 AM 04/04/2020   10:58 AM 02/26/2021    7:29 AM  Fall Risk  Falls in the past year?   0    Was there an injury with Fall?   0    Fall Risk Category Calculator   0    Fall Risk Category   Low    Patient Fall Risk Level High fall risk High fall risk High fall risk High fall risk Moderate fall risk   Functional Status Survey:    Vitals:   04/20/22 1337  BP: (!) 141/68  Pulse: 72  Weight: 127 lb (57.6 kg)  Height: '5\' 3"'$  (1.6 m)   Body mass index is 22.5 kg/m. Physical Exam Constitutional:      General: She is not in acute distress.    Appearance: She is well-developed. She is not diaphoretic.  HENT:     Head: Normocephalic and atraumatic.     Mouth/Throat:     Pharynx: No oropharyngeal exudate.  Eyes:     Conjunctiva/sclera: Conjunctivae normal.     Pupils: Pupils are equal, round, and reactive to light.  Cardiovascular:     Rate and Rhythm: Normal rate and regular rhythm.     Heart sounds: Normal heart sounds.  Pulmonary:     Effort: Pulmonary effort is normal.     Breath sounds: Normal breath sounds.  Abdominal:     General: Bowel sounds are normal.     Palpations: Abdomen is soft.   Musculoskeletal:     Cervical back: Normal range of motion and neck supple.     Right lower leg: Edema present.     Left lower leg: Edema present.  Skin:    General: Skin is warm and dry.     Comments: Back of LLE with scab and tenderness, small amount of erythema noted, area not warm and without drainage at this time  Neurological:     Mental Status: She is alert.     Gait: Gait abnormal.  Psychiatric:        Mood and Affect: Mood normal.     Labs reviewed: No results for input(s): "NA", "K", "CL", "CO2", "GLUCOSE", "BUN", "CREATININE", "CALCIUM", "MG", "PHOS" in the last 8760 hours. No results for input(s): "AST", "ALT", "ALKPHOS", "BILITOT", "PROT", "ALBUMIN" in the last 8760 hours. No results for input(s): "WBC", "NEUTROABS", "HGB", "HCT", "MCV", "PLT" in the last 8760 hours. Lab Results  Component Value Date   TSH 2.721 10/11/2017   No results found for: "HGBA1C" Lab Results  Component Value Date   CHOL 271 (H) 09/17/2013   HDL 49.10 09/17/2013   LDLCALC 200 (H) 09/17/2013   TRIG 110.0 09/17/2013   CHOLHDL 6 09/17/2013    Significant Diagnostic Results in last 30 days:  No results found.  Assessment/Plan 1. Eschar of lower leg Ongoing to left lower leg with mild tenderness.  Pt request appt to go see dermatologist regarding nonhealing sight.  Will have nursing apply mupirocin BID and make appt for follow up with derm.    Carlos American. [place], [facility] [phone]

## 2022-04-26 ENCOUNTER — Ambulatory Visit (INDEPENDENT_AMBULATORY_CARE_PROVIDER_SITE_OTHER): Payer: Medicare Other | Admitting: Dermatology

## 2022-04-26 VITALS — BP 122/81 | HR 83

## 2022-04-26 DIAGNOSIS — L578 Other skin changes due to chronic exposure to nonionizing radiation: Secondary | ICD-10-CM

## 2022-04-26 DIAGNOSIS — I872 Venous insufficiency (chronic) (peripheral): Secondary | ICD-10-CM

## 2022-04-26 DIAGNOSIS — C44722 Squamous cell carcinoma of skin of right lower limb, including hip: Secondary | ICD-10-CM

## 2022-04-26 DIAGNOSIS — D099 Carcinoma in situ, unspecified: Secondary | ICD-10-CM

## 2022-04-26 DIAGNOSIS — C44729 Squamous cell carcinoma of skin of left lower limb, including hip: Secondary | ICD-10-CM | POA: Diagnosis not present

## 2022-04-26 DIAGNOSIS — D045 Carcinoma in situ of skin of trunk: Secondary | ICD-10-CM

## 2022-04-26 DIAGNOSIS — D492 Neoplasm of unspecified behavior of bone, soft tissue, and skin: Secondary | ICD-10-CM

## 2022-04-26 MED ORDER — MUPIROCIN 2 % EX OINT: 1.0000 | TOPICAL_OINTMENT | Freq: Every day | CUTANEOUS | 1 refills | Status: DC

## 2022-04-26 NOTE — Progress Notes (Signed)
Follow-Up Visit   Subjective  Teresa Meyer is a 88 y.o. female who presents for the following: SCC IS bx proven (R mid cheek, pt has not treated, spoke to nurse at [facility] and they never received the Skin Medicinals 5FU/Calcipotriene or orders.) and check spots (Lower legs, ~1.5 months, painful to touch).  They may have been frozen in the past.  Patient accompanied by caregiver.   The following portions of the chart were reviewed this encounter and updated as appropriate:       Review of Systems:  No other skin or systemic complaints except as noted in HPI or Assessment and Plan.  Objective  Well appearing patient in no apparent distress; mood and affect are within normal limits.  A focused examination was performed including face, lower legs. Relevant physical exam findings are noted in the Assessment and Plan.  R mid cheek Pink indurated macule   L lower calf 1.5cm keratotic plaque      R ant ankle 1.3 pink crusted nodule      Right Lower Leg - Anterior Stasis changes bil lower legs    Assessment & Plan   Actinic Damage - chronic, secondary to cumulative UV radiation exposure/sun exposure over time - diffuse scaly erythematous macules with underlying dyspigmentation - Recommend daily broad spectrum sunscreen SPF 30+ to sun-exposed areas, reapply every 2 hours as needed.  - Recommend staying in the shade or wearing long sleeves, sun glasses (UVA+UVB protection) and wide brim hats (4-inch brim around the entire circumference of the hat). - Call for new or changing lesions.   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  Destruction of lesion  Destruction method: electrodesiccation and curettage   Informed consent: discussed and consent obtained   Timeout:  patient name, date of birth, surgical site, and procedure verified Anesthesia: the lesion was anesthetized in a standard fashion   Anesthetic:  1% lidocaine w/ epinephrine 1-100,000 buffered w/  8.4% NaHCO3 Curettage performed in three different directions: Yes   Electrodesiccation performed over the curetted area: Yes   Final wound size (cm):  1.3 Hemostasis achieved with:  pressure, aluminum chloride and electrodesiccation Outcome: patient tolerated procedure well with no complications   Post-procedure details: wound care instructions given   Additional details:  Mupirocin ointment and Bandaid applied   Bx proven  Neoplasm of skin (2) L lower calf  Epidermal / dermal shaving  Lesion diameter (cm):  1.5 Informed consent: discussed and consent obtained   Patient was prepped and draped in usual sterile fashion: area prepped with alcohol. Anesthesia: the lesion was anesthetized in a standard fashion   Anesthetic:  1% lidocaine w/ epinephrine 1-100,000 buffered w/ 8.4% NaHCO3 Instrument used: flexible razor blade   Hemostasis achieved with: pressure, aluminum chloride and electrodesiccation   Outcome: patient tolerated procedure well    Destruction of lesion  Destruction method: electrodesiccation and curettage   Informed consent: discussed and consent obtained   Curettage performed in three different directions: Yes   Electrodesiccation performed over the curetted area: Yes   Final wound size (cm):  1.5 Hemostasis achieved with:  pressure, aluminum chloride and electrodesiccation Outcome: patient tolerated procedure well with no complications   Post-procedure details: wound care instructions given   Additional details:  Mupirocin ointment and Bandaid applied   mupirocin ointment (BACTROBAN) 2 % Apply 1 Application topically daily. Qd to biopsy sites on right cheek, right anterior ankle and left lower calf until healed  Specimen 1 - Surgical  pathology Differential Diagnosis: D48.5 R/O SCC  Check Margins: No 1.5cm keratotic plaque EDC  R ant ankle  Epidermal / dermal shaving  Lesion diameter (cm):  1.3 Informed consent: discussed and consent obtained    Patient was prepped and draped in usual sterile fashion: area prepped with alcohol. Anesthesia: the lesion was anesthetized in a standard fashion   Anesthetic:  1% lidocaine w/ epinephrine 1-100,000 buffered w/ 8.4% NaHCO3 Instrument used: flexible razor blade   Hemostasis achieved with: pressure, aluminum chloride and electrodesiccation   Outcome: patient tolerated procedure well    Destruction of lesion  Destruction method: electrodesiccation and curettage   Informed consent: discussed and consent obtained   Curettage performed in three different directions: Yes   Electrodesiccation performed over the curetted area: Yes   Final wound size (cm):  1.4 Hemostasis achieved with:  pressure, aluminum chloride and electrodesiccation Outcome: patient tolerated procedure well with no complications   Post-procedure details: wound care instructions given   Additional details:  Mupirocin ointment and Bandaid applied   Specimen 2 - Surgical pathology Differential Diagnosis: D48.5 R/O SCC  Check Margins: yes 1.3 pink crusted nodule EDC  Recommend compression socks qd to help with wound healing  Stasis dermatitis of both legs Right Lower Leg - Anterior  Stasis in the legs causes chronic leg swelling, which may result in itchy or painful rashes, skin discoloration, skin texture changes, and sometimes ulceration.  Recommend daily graduated compression hose/stockings- easiest to put on first thing in morning, remove at bedtime.  Elevate legs as much as possible. Avoid salt/sodium rich foods.  SCC (squamous cell carcinoma), leg, left  SCC (squamous cell carcinoma), leg, right   Return in about 1 month (around 05/27/2022) for recheck SCC IS R mid cheek and Bx/EDC sites.  I, Othelia Pulling, RMA, am acting as scribe for Brendolyn Patty, MD .  Documentation: I have reviewed the above documentation for accuracy and completeness, and I agree with the above.  Brendolyn Patty MD

## 2022-04-26 NOTE — Patient Instructions (Addendum)
Wound Care Instructions  Cleanse wound gently with soap and water once a day then pat dry with clean gauze. Apply a thin coat of Petrolatum (petroleum jelly, "Vaseline") over the wound (unless you have an allergy to this). We recommend that you use a new, sterile tube of Vaseline. Do not pick or remove scabs. Do not remove the yellow or white "healing tissue" from the base of the wound.  Cover the wound with fresh, clean, nonstick gauze and secure with paper tape. You may use Band-Aids in place of gauze and tape if the wound is small enough, but would recommend trimming much of the tape off as there is often too much. Sometimes Band-Aids can irritate the skin.  You should call the office for your biopsy report after 1 week if you have not already been contacted.  If you experience any problems, such as abnormal amounts of bleeding, swelling, significant bruising, significant pain, or evidence of infection, please call the office immediately.  FOR ADULT SURGERY PATIENTS: If you need something for pain relief you may take 1 extra strength Tylenol (acetaminophen) AND 2 Ibuprofen (200mg each) together every 4 hours as needed for pain. (do not take these if you are allergic to them or if you have a reason you should not take them.) Typically, you may only need pain medication for 1 to 3 days.     Due to recent changes in healthcare laws, you may see results of your pathology and/or laboratory studies on MyChart before the doctors have had a chance to review them. We understand that in some cases there may be results that are confusing or concerning to you. Please understand that not all results are received at the same time and often the doctors may need to interpret multiple results in order to provide you with the best plan of care or course of treatment. Therefore, we ask that you please give us 2 business days to thoroughly review all your results before contacting the office for clarification. Should  we see a critical lab result, you will be contacted sooner.   If You Need Anything After Your Visit  If you have any questions or concerns for your doctor, please call our main line at [phone] and press option 4 to reach your doctor's medical assistant. If no one answers, please leave a voicemail as directed and we will return your call as soon as possible. Messages left after 4 pm will be answered the following business day.   You may also send us a message via MyChart. We typically respond to MyChart messages within 1-2 business days.  For prescription refills, please ask your pharmacy to contact our office. Our fax number is [phone].  If you have an urgent issue when the clinic is closed that cannot wait until the next business day, you can page your doctor at the number below.    Please note that while we do our best to be available for urgent issues outside of office hours, we are not available 24/7.   If you have an urgent issue and are unable to reach us, you may choose to seek medical care at your doctor's office, retail clinic, urgent care center, or emergency room.  If you have a medical emergency, please immediately call 911 or go to the emergency department.  Pager Numbers  - Dr. Kowalski: [phone]  - Dr. Moye: [phone]  - Dr. Stewart: [phone]  In the event of inclement weather, please call our main line at   [phone] for an update on the status of any delays or closures.  Dermatology Medication Tips: Please keep the boxes that topical medications come in in order to help keep track of the instructions about where and how to use these. Pharmacies typically print the medication instructions only on the boxes and not directly on the medication tubes.   If your medication is too expensive, please contact our office at [phone] option 4 or send us a message through MyChart.   We are unable to tell what your co-pay for medications will be in  advance as this is different depending on your insurance coverage. However, we may be able to find a substitute medication at lower cost or fill out paperwork to get insurance to cover a needed medication.   If a prior authorization is required to get your medication covered by your insurance company, please allow us 1-2 business days to complete this process.  Drug prices often vary depending on where the prescription is filled and some pharmacies may offer cheaper prices.  The website www.goodrx.com contains coupons for medications through different pharmacies. The prices here do not account for what the cost may be with help from insurance (it may be cheaper with your insurance), but the website can give you the price if you did not use any insurance.  - You can print the associated coupon and take it with your prescription to the pharmacy.  - You may also stop by our office during regular business hours and pick up a GoodRx coupon card.  - If you need your prescription sent electronically to a different pharmacy, notify our office through [place] MyChart or by phone at [phone] option 4.     Si Usted Necesita Algo Despus de Su Visita  Tambin puede enviarnos un mensaje a travs de MyChart. Por lo general respondemos a los mensajes de MyChart en el transcurso de 1 a 2 das hbiles.  Para renovar recetas, por favor pida a su farmacia que se ponga en contacto con nuestra oficina. Nuestro nmero de fax es el [phone].  Si tiene un asunto urgente cuando la clnica est cerrada y que no puede esperar hasta el siguiente da hbil, puede llamar/localizar a su doctor(a) al nmero que aparece a continuacin.   Por favor, tenga en cuenta que aunque hacemos todo lo posible para estar disponibles para asuntos urgentes fuera del horario de oficina, no estamos disponibles las 24 horas del da, los 7 das de la semana.   Si tiene un problema urgente y no puede comunicarse con nosotros, puede  optar por buscar atencin mdica  en el consultorio de su doctor(a), en una clnica privada, en un centro de atencin urgente o en una sala de emergencias.  Si tiene una emergencia mdica, por favor llame inmediatamente al 911 o vaya a la sala de emergencias.  Nmeros de bper  - Dr. Kowalski: [phone]  - Dra. Moye: [phone]  - Dra. Stewart: [phone]  En caso de inclemencias del tiempo, por favor llame a nuestra lnea principal al [phone] para una actualizacin sobre el estado de cualquier retraso o cierre.  Consejos para la medicacin en dermatologa: Por favor, guarde las cajas en las que vienen los medicamentos de uso tpico para ayudarle a seguir las instrucciones sobre dnde y cmo usarlos. Las farmacias generalmente imprimen las instrucciones del medicamento slo en las cajas y no directamente en los tubos del medicamento.   Si su medicamento es muy caro, por favor, pngase en contacto con   nuestra oficina llamando al [phone] y presione la opcin 4 o envenos un mensaje a travs de MyChart.   No podemos decirle cul ser su copago por los medicamentos por adelantado ya que esto es diferente dependiendo de la cobertura de su seguro. Sin embargo, es posible que podamos encontrar un medicamento sustituto a menor costo o llenar un formulario para que el seguro cubra el medicamento que se considera necesario.   Si se requiere una autorizacin previa para que su compaa de seguros cubra su medicamento, por favor permtanos de 1 a 2 das hbiles para completar este proceso.  Los precios de los medicamentos varan con frecuencia dependiendo del lugar de dnde se surte la receta y alguna farmacias pueden ofrecer precios ms baratos.  El sitio web www.goodrx.com tiene cupones para medicamentos de diferentes farmacias. Los precios aqu no tienen en cuenta lo que podra costar con la ayuda del seguro (puede ser ms barato con su seguro), pero el sitio web puede darle el  precio si no utiliz ningn seguro.  - Puede imprimir el cupn correspondiente y llevarlo con su receta a la farmacia.  - Tambin puede pasar por nuestra oficina durante el horario de atencin regular y recoger una tarjeta de cupones de GoodRx.  - Si necesita que su receta se enve electrnicamente a una farmacia diferente, informe a nuestra oficina a travs de MyChart de [place] o por telfono llamando al [phone] y presione la opcin 4.  

## 2022-05-05 ENCOUNTER — Telehealth: Payer: Self-pay

## 2022-05-05 NOTE — Telephone Encounter (Signed)
-----   Message from Brendolyn Patty, MD sent at 05/05/2022 12:02 PM EST ----- 1. Skin , left lower calf WELL DIFFERENTIATED SQUAMOUS CELL CARCINOMA 2. Skin , right ant ankle WELL DIFFERENTIATED SQUAMOUS CELL CARCINOMA, ULCERATED  1. and 2. SCC skin cancer- already treated with EDC at time of biopsy   - please call patient

## 2022-05-05 NOTE — Telephone Encounter (Signed)
Left pt's daughter message to call for bx results/sh

## 2022-05-10 ENCOUNTER — Telehealth: Payer: Self-pay

## 2022-05-10 NOTE — Telephone Encounter (Signed)
Patient's daughter advised of BX results. aw 

## 2022-05-10 NOTE — Telephone Encounter (Signed)
-----   Message from Brendolyn Patty, MD sent at 05/05/2022 12:02 PM EST ----- 1. Skin , left lower calf WELL DIFFERENTIATED SQUAMOUS CELL CARCINOMA 2. Skin , right ant ankle WELL DIFFERENTIATED SQUAMOUS CELL CARCINOMA, ULCERATED  1. and 2. SCC skin cancer- already treated with EDC at time of biopsy   - please call patient

## 2022-05-11 ENCOUNTER — Non-Acute Institutional Stay (SKILLED_NURSING_FACILITY): Payer: Medicare Other | Admitting: Nurse Practitioner

## 2022-05-11 DIAGNOSIS — H903 Sensorineural hearing loss, bilateral: Secondary | ICD-10-CM

## 2022-05-11 DIAGNOSIS — I10 Essential (primary) hypertension: Secondary | ICD-10-CM

## 2022-05-11 DIAGNOSIS — I872 Venous insufficiency (chronic) (peripheral): Secondary | ICD-10-CM | POA: Diagnosis not present

## 2022-05-11 DIAGNOSIS — D509 Iron deficiency anemia, unspecified: Secondary | ICD-10-CM | POA: Diagnosis not present

## 2022-05-11 DIAGNOSIS — K219 Gastro-esophageal reflux disease without esophagitis: Secondary | ICD-10-CM

## 2022-05-11 DIAGNOSIS — M1712 Unilateral primary osteoarthritis, left knee: Secondary | ICD-10-CM

## 2022-05-11 DIAGNOSIS — K5900 Constipation, unspecified: Secondary | ICD-10-CM

## 2022-05-11 DIAGNOSIS — E559 Vitamin D deficiency, unspecified: Secondary | ICD-10-CM

## 2022-05-11 NOTE — Progress Notes (Unsigned)
[facility]  [facility] Room Number: 656 CLEXN of Service:  SNF (31)  Dewayne Shorter, MD  Patient Care Team: Dewayne Shorter, MD as PCP - General (Family Medicine) Rolm Bookbinder, MD as Consulting Physician (Dermatology) Marry Guan, Laurice Record, MD as Consulting Physician (Orthopedic Surgery) [name], MD as Referring Physician (Ophthalmology) Kipp Laurence, MD as Referring Physician (Audiology) Wandra Mannan, DMD as Referring Physician (Dentistry)  Extended Emergency Contact Information Primary Emergency Contact: Cloyd Stagers of [place] Phone: [phone] Relation: Daughter Secondary Emergency Contact: Marcello Moores Mobile Phone: [phone] Relation: Daughter  Goals of care: Advanced Directive information    04/20/2022    1:45 PM  Advanced Directives  Does Patient Have a Medical Advance Directive? Yes  Type of Paramedic of [place];Out of facility DNR (pink MOST or yellow form)  Does patient want to make changes to medical advance directive? No - Patient declined  Copy of [place] in Chart? Yes - validated most recent copy scanned in chart (See row information)  Pre-existing out of facility DNR order (yellow form or pink MOST form) Yellow form placed in chart (order not valid for inpatient use)     Chief Complaint  Patient presents with   Medical Management of Chronic Issues    Routine follow up    HPI:  Pt is a 88 y.o. 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   Continues to have pain in her left knee due to OA- has tylenol PRN     Past Medical History:   Diagnosis Date   Acute gangrenous cholecystitis 02/12/2020   Arthritis    Atypical nevi 09/2014   lentiginous dysplastic nevus with mod atypia ([place])   Basal cell carcinoma 08/12/2017   R distal dorsum nose    Cholecystitis with gangrene of gallbladder    Diverticulitis    DNR (do not resuscitate)    Esophageal stricture 2012   GERD (gastroesophageal reflux disease)    HLD (hyperlipidemia)    did not tolerate statin, stopped checking   HTN (hypertension)    Hypokalemia    Hypomagnesemia    Hyponatremia    Leg wound, left, subsequent encounter 11/12/2017   Malignant melanoma ([place]) 2015, 2017   R chin s/p excision ([place]) R upper back [name])   Malignant melanoma ([place]) 07/27/19 WLE at [facility]   R lat deltoid   Multiple rib fractures 11/02/2017   Osteoporosis, post-menopausal    bisphosphonate caused dysphagia, requests defer DEXA for now   SCC (squamous cell carcinoma) 03/16/2022   R mid cheek, txt with 58fu/calcipotriene   SCC (squamous cell carcinoma) 04/26/2022   L lower calf,EDC   SCC (squamous cell carcinoma) 04/26/2022   R ant ankle, EDC   Seasonal allergies    Sepsis ([place] 02/12/2020   Skin lesion 08/17/2018   Squamous cell carcinoma of skin 08/21/2018   L med leg below knee   Squamous cell carcinoma of skin 01/20/2017   R pretibial lateral   Squamous cell carcinoma of skin 09/25/2020   Left med lower leg above ankle - EDC   Squamous cell carcinoma of skin 09/09/2021   L ankle,  EDC   Wears hearing aid    Past Surgical History:  Procedure Laterality Date   BUNIONECTOMY Right 2001   CARPAL TUNNEL RELEASE Bilateral 1974   CATARACT EXTRACTION Bilateral    [place]   IR CHOLANGIOGRAM EXISTING TUBE  04/04/2020   IR CHOLANGIOGRAM EXISTING TUBE  01/19/2021   IR EXCHANGE BILIARY DRAIN  02/26/2021   IR EXCHANGE BILIARY DRAIN  09/01/2021   IR EXCHANGE BILIARY DRAIN  12/04/2021   IR EXCHANGE BILIARY DRAIN  02/24/2022   IR RADIOLOGIST EVAL & MGMT  01/22/2021   IR REMOVAL  OF CALCULI/DEBRIS BILIARY DUCT/GB  02/26/2021   TONSILLECTOMY  1928    Allergies  Allergen Reactions   Other Rash and Other (See Comments)    Silk Suture - redness and severe pain   Bisphosphonates Other (See Comments)    Dysphagia to oral bisphosphonates   Codeine Nausea And Vomiting   Statins Other (See Comments)    Muscle aches   Sulfa Antibiotics     Can't recall reaction   Procaine Hcl Palpitations    Outpatient Encounter Medications as of 05/11/2022  Medication Sig   acetaminophen (TYLENOL) 500 MG tablet Take 500 mg by mouth every 4 (four) hours as needed for mild pain.   ammonium lactate (AMLACTIN) 12 % cream Apply 1 Application topically as needed for dry skin.   bisacodyl (DULCOLAX) 10 MG suppository Place 10 mg rectally daily as needed for moderate constipation.   carbamide peroxide (DEBROX) 6.5 % OTIC solution Place 5 drops into both ears as needed.   Emollient (GOLD BOND ULT ROUGH/BUMPY SKIN) CREA Apply topically 2 (two) times daily. Apply to trunk and extremities topically every day and evening   magnesium hydroxide (MILK OF MAGNESIA) 400 MG/5ML suspension Take 30 mLs by mouth every 12 (twelve) hours as needed for mild constipation.   Menthol, Topical Analgesic, (EUCERIN ITCH RELIEF) 0.1 % LOTN Apply 1 Application topically every 8 (eight) hours as needed (For itching).   mupirocin ointment (BACTROBAN) 2 % Apply 1 Application topically daily. Qd to biopsy sites on right cheek, right anterior ankle and left lower calf until healed   nepafenac (ILEVRO) 0.3 % ophthalmic suspension Place 1 drop into the left eye daily.    polyethylene glycol (MIRALAX / GLYCOLAX) 17 g packet Take 17 g by mouth daily as needed for moderate constipation.   Pramoxine-Menthol (GOLD BOND RAPID RELIEF) 1-1 % CREA Apply to feet, toes and other affected areas as needed for itch.   prednisoLONE acetate (PRED MILD) 0.12 % ophthalmic suspension Place 1 drop into the left eye daily.    Propylene Glycol  (SYSTANE COMPLETE OP) Place 1 drop into the left eye every 8 (eight) hours as needed (dry eyes).   Vitamin D, Ergocalciferol, (DRISDOL) 1.25 MG (50000 UNIT) CAPS capsule Take 50,000 Units by mouth every 30 (thirty) days. Give on the 4th of every month.   No facility-administered encounter medications on file as of 05/11/2022.    Review of Systems  Constitutional:  Negative for activity change, appetite change, fatigue and unexpected weight change.  HENT:  Positive for hearing loss. Negative for congestion.   Eyes: Negative.   Respiratory:  Negative for cough and shortness of breath.   Cardiovascular:  Positive for leg swelling. Negative for chest pain and palpitations.  Gastrointestinal:  Negative for abdominal pain, constipation and diarrhea.  Genitourinary:  Negative for difficulty urinating and dysuria.  Musculoskeletal:  Negative for arthralgias and myalgias.  Skin:  Negative for color change and  wound.  Neurological:  Negative for dizziness and weakness.  Psychiatric/Behavioral:  Negative for agitation, behavioral problems and confusion.     Immunization History  Administered Date(s) Administered   Fluad Quad(high Dose 65+) 03/22/2019   Influenza, High Dose Seasonal PF 03/09/2022   Influenza,inj,Quad PF,6+ Mos 03/27/2013, 02/08/2017, 02/09/2018   Influenza-Unspecified 02/21/2014   Moderna Covid-19 Vaccine Bivalent Booster 19yr & up 02/13/2021   Moderna SARS-COV2 Booster Vaccination 08/20/2020   Moderna Sars-Covid-2 Vaccination 06/05/2019, 07/03/2019   Pfizer Covid-19 Vaccine Bivalent Booster 128yr& up 02/13/2021   Pneumococcal Polysaccharide-23 03/20/2012   Pertinent  Health Maintenance Due  Topic Date Due   INFLUENZA VACCINE  Completed   DEXA SCAN  Completed      02/15/2020    9:00 AM 02/15/2020    9:49 AM 04/03/2020    9:55 AM 04/04/2020   10:58 AM 02/26/2021    7:29 AM  Fall Risk  Falls in the past year?   0    Was there an injury with Fall?   0    Fall Risk  Category Calculator   0    Fall Risk Category   Low    Patient Fall Risk Level High fall risk High fall risk High fall risk High fall risk Moderate fall risk   Functional Status Survey:    Vitals:   05/11/22 1634  BP: (!) 146/79  Pulse: 70  Resp: 18  Temp: 98.7 F (37.1 C)  SpO2: 96%   There is no height or weight on file to calculate BMI. Physical Exam Constitutional:      General: She is not in acute distress.    Appearance: She is well-developed. She is not diaphoretic.  HENT:     Head: Normocephalic and atraumatic.     Mouth/Throat:     Pharynx: No oropharyngeal exudate.  Eyes:     Conjunctiva/sclera: Conjunctivae normal.     Pupils: Pupils are equal, round, and reactive to light.  Cardiovascular:     Rate and Rhythm: Normal rate and regular rhythm.     Heart sounds: Normal heart sounds.  Pulmonary:     Effort: Pulmonary effort is normal.     Breath sounds: Normal breath sounds.  Abdominal:     General: Bowel sounds are normal.     Palpations: Abdomen is soft.  Musculoskeletal:     Cervical back: Normal range of motion and neck supple.     Right lower leg: No edema.     Left lower leg: No edema.  Skin:    General: Skin is warm and dry.  Neurological:     Mental Status: She is alert. Mental status is at baseline.     Gait: Gait abnormal (uses walker).  Psychiatric:        Mood and Affect: Mood normal.     Labs reviewed: No results for input(s): "NA", "K", "CL", "CO2", "GLUCOSE", "BUN", "CREATININE", "CALCIUM", "MG", "PHOS" in the last 8760 hours. No results for input(s): "AST", "ALT", "ALKPHOS", "BILITOT", "PROT", "ALBUMIN" in the last 8760 hours. No results for input(s): "WBC", "NEUTROABS", "HGB", "HCT", "MCV", "PLT" in the last 8760 hours. Lab Results  Component Value Date   TSH 2.721 10/11/2017   No results found for: "HGBA1C" Lab Results  Component Value Date   CHOL 271 (H) 09/17/2013   HDL 49.10 09/17/2013   LDLCALC 200 (H) 09/17/2013   TRIG  110.0 09/17/2013   CHOLHDL 6 09/17/2013    Significant Diagnostic Results in last 30 days:  No  results found.  Assessment/Plan 1. Essential hypertension -Blood pressure stable goal bp <140/90 Continue current medications and dietary modifications follow metabolic panel  2. Chronic venous insufficiency Ongoing, encouraged elevation, compression hose PRN  3. Gastroesophageal reflux disease, unspecified whether esophagitis present Stable without worsening of symptoms.   4. Iron deficiency anemia, unspecified iron deficiency anemia type Follow up cbc  5. Primary osteoarthritis of left knee Ongoing, uses tylenol PRN  6. Constipation, unspecified constipation type Controlled on OTC medications.   7. Vitamin D deficiency -continues on supplement, will follow up vit D level   Draeden Kellman K. [place], [facility] [phone]

## 2022-05-25 ENCOUNTER — Ambulatory Visit: Payer: TRICARE For Life (TFL) | Admitting: Dermatology

## 2022-05-25 ENCOUNTER — Ambulatory Visit (INDEPENDENT_AMBULATORY_CARE_PROVIDER_SITE_OTHER): Payer: Medicare Other | Admitting: Dermatology

## 2022-05-25 ENCOUNTER — Other Ambulatory Visit: Payer: Self-pay | Admitting: Student

## 2022-05-25 ENCOUNTER — Encounter: Payer: Self-pay | Admitting: Dermatology

## 2022-05-25 DIAGNOSIS — Z8719 Personal history of other diseases of the digestive system: Secondary | ICD-10-CM

## 2022-05-25 DIAGNOSIS — Z85828 Personal history of other malignant neoplasm of skin: Secondary | ICD-10-CM

## 2022-05-25 DIAGNOSIS — I872 Venous insufficiency (chronic) (peripheral): Secondary | ICD-10-CM

## 2022-05-25 DIAGNOSIS — Z5189 Encounter for other specified aftercare: Secondary | ICD-10-CM | POA: Diagnosis not present

## 2022-05-25 NOTE — Patient Instructions (Addendum)
Legs: Continue wound care. Wash once daily with soap and water. Apply Rx Mupirocin ointment, cover with a bandage. Recommend non-stick telfa held in place by Coban wrap.     Stasis in the legs causes chronic leg swelling, which may result in itchy or painful rashes, skin discoloration, skin texture changes, and sometimes ulceration.  Recommend daily graduated compression hose/stockings- easiest to put on first thing in morning, remove at bedtime.  Elevate legs as much as possible. Avoid salt/sodium rich foods.  Recommend OTC Gold Bond Rapid Relief Anti-Itch cream (pramoxine + menthol), CeraVe Anti-itch cream or lotion (pramoxine), Sarna lotion (Original- menthol + camphor or Sensitive- pramoxine) or Eucerin 12 hour Itch Relief lotion (menthol) up to 3 times per day to areas on body that are itchy.   Due to recent changes in healthcare laws, you may see results of your pathology and/or laboratory studies on MyChart before the doctors have had a chance to review them. We understand that in some cases there may be results that are confusing or concerning to you. Please understand that not all results are received at the same time and often the doctors may need to interpret multiple results in order to provide you with the best plan of care or course of treatment. Therefore, we ask that you please give [place] 2 business days to thoroughly review all your results before contacting the office for clarification. Should we see a critical lab result, you will be contacted sooner.   If You Need Anything After Your Visit  If you have any questions or concerns for your doctor, please call our main line at [phone] and press option 4 to reach your doctor's medical assistant. If no one answers, please leave a voicemail as directed and we will return your call as soon as possible. Messages left after 4 pm will be answered the following business day.   You may also send [place] a message via [place]. We typically respond  to MyChart messages within 1-2 business days.  For prescription refills, please ask your pharmacy to contact our office. Our fax number is [phone].  If you have an urgent issue when the clinic is closed that cannot wait until the next business day, you can page your doctor at the number below.    Please note that while we do our best to be available for urgent issues outside of office hours, we are not available 24/7.   If you have an urgent issue and are unable to reach [place], you may choose to seek medical care at your doctor's office, retail clinic, urgent care center, or emergency room.  If you have a medical emergency, please immediately call 911 or go to the emergency department.  Pager Numbers  - Dr. Nehemiah Massed: [phone]  - Dr. Laurence Ferrari: [phone]  - Dr. Nicole Kindred: [phone]  In the event of inclement weather, please call our main line at [phone] for an update on the status of any delays or closures.  Dermatology Medication Tips: Please keep the boxes that topical medications come in in order to help keep track of the instructions about where and how to use these. Pharmacies typically print the medication instructions only on the boxes and not directly on the medication tubes.   If your medication is too expensive, please contact our office at [phone] option 4 or send [place] a message through [place].   We are unable to tell what your co-pay for medications will be in advance as this is different depending on your  insurance coverage. However, we may be able to find a substitute medication at lower cost or fill out paperwork to get insurance to cover a needed medication.   If a prior authorization is required to get your medication covered by your insurance company, please allow [place] 1-2 business days to complete this process.  Drug prices often vary depending on where the prescription is filled and some pharmacies may offer cheaper prices.  The website www.goodrx.com  contains coupons for medications through different pharmacies. The prices here do not account for what the cost may be with help from insurance (it may be cheaper with your insurance), but the website can give you the price if you did not use any insurance.  - You can print the associated coupon and take it with your prescription to the pharmacy.  - You may also stop by our office during regular business hours and pick up a GoodRx coupon card.  - If you need your prescription sent electronically to a different pharmacy, notify our office through [facility] or by phone at [phone] option 4.     Si Usted Necesita Algo Despus de Su Visita  Tambin puede enviarnos un mensaje a travs de Pharmacist, community. Por lo general respondemos a los mensajes de MyChart en el transcurso de 1 a 2 das hbiles.  Para renovar recetas, por favor pida a su farmacia que se ponga en contacto con nuestra oficina. Harland Dingwall de fax es [place] [phone].  Si tiene un asunto urgente cuando la clnica est cerrada y que no puede esperar hasta el siguiente da hbil, puede llamar/localizar a su doctor(a) al nmero que aparece a continuacin.   Por favor, tenga en cuenta que aunque hacemos todo lo posible para estar disponibles para asuntos urgentes fuera del horario de [place], no estamos disponibles las 24 horas del da, los 7 das de la [place].   Si tiene un problema urgente y no puede comunicarse con nosotros, puede optar por buscar atencin mdica  en el consultorio de su doctor(a), en una clnica privada, en un centro de atencin urgente o en una sala de emergencias.  Si tiene Engineering geologist, por favor llame inmediatamente al 911 o vaya a la sala de emergencias.  Nmeros de bper  - Dr. Nehemiah Massed: [phone]  - Dra. Moye: [phone]  - Dra. Nicole Kindred: [phone]  En caso de inclemencias del [place], por favor llame a Johnsie Kindred principal al [phone] para una actualizacin sobre el [place]  de cualquier retraso o cierre.  Consejos para la medicacin en dermatologa: Por favor, guarde las cajas en las que vienen los medicamentos de uso tpico para ayudarle a seguir las instrucciones sobre dnde y cmo usarlos. Las farmacias generalmente imprimen las instrucciones del medicamento slo en las cajas y no directamente en los tubos del [place].   Si su medicamento es muy caro, por favor, pngase en contacto con Zigmund Daniel llamando al [phone] y presione la opcin 4 o envenos un mensaje a travs de Pharmacist, community.   No podemos decirle cul ser su copago por los medicamentos por adelantado ya que esto es diferente dependiendo de la cobertura de su seguro. Sin embargo, es posible que podamos encontrar un medicamento sustituto a Electrical engineer un formulario para que el seguro cubra el medicamento que se considera necesario.   Si se requiere una autorizacin previa para que su compaa de seguros [place] su medicamento, por favor permtanos de 1 a 2 das hbiles para completar este proceso.  Los  precios de los medicamentos varan con frecuencia dependiendo del lugar de dnde se surte la receta y alguna farmacias pueden ofrecer precios ms baratos.  El sitio web www.goodrx.com tiene cupones para medicamentos de Airline pilot. Los precios aqu no tienen en cuenta lo que podra costar con la ayuda del seguro (puede ser ms barato con su seguro), pero el sitio web puede darle el precio si no utiliz Research scientist (physical sciences).  - Puede imprimir el cupn correspondiente y llevarlo con su receta a la farmacia.  - Tambin puede pasar por nuestra oficina durante el horario de atencin regular y Charity fundraiser una tarjeta de cupones de GoodRx.  - Si necesita que su receta se enve electrnicamente a una farmacia diferente, informe a nuestra oficina a travs de MyChart de [place] o por telfono llamando al [phone] y presione la opcin 4.

## 2022-05-25 NOTE — Progress Notes (Signed)
   Follow-Up Visit   Subjective  Teresa Meyer is a 89 y.o. female who presents for the following: Follow-up (Recheck EDC sites. SCC at right mid cheek, [facility] 04/26/2022. SCC at right ankle and left lower calf; Tx with [facility] 04/26/2022).  The patient has spots, moles and lesions to be evaluated, some may be new or changing and the patient has concerns that these could be cancer.  Patient accompanied by daughter, Jeani Hawking, who contributes to history.  The following portions of the chart were reviewed this encounter and updated as appropriate:      Review of Systems: No other skin or systemic complaints except as noted in HPI or Assessment and Plan.   Objective  Well appearing patient in no apparent distress; mood and affect are within normal limits.  A focused examination was performed including face, right ankle and left calf. Relevant physical exam findings are noted in the Assessment and Plan.  Right Ankle - Anterior, left lower calf Eschar with surrounding scale at [facility] sites, no signs of infection  B/L lower legs Erythematous, scaly patches involving the ankle and distal lower leg with associated lower leg edema.    Assessment & Plan   History of Squamous Cell Carcinoma of the Skin. Right mid cheek.  - No evidence of recurrence today - Recommend regular full body skin exams - Recommend daily broad spectrum sunscreen SPF 30+ to sun-exposed areas, reapply every 2 hours as needed.  - Call if any new or changing lesions are noted between office visits    Visit for wound check Right Ankle - Anterior, left lower calf  S/p EDC x 2 SCCs, Healing well. No recurrence SCC. Continue wound care. Wash once daily with soap and water. Apply Mupirocin ointment, cover with a bandage.   Stasis dermatitis of both legs B/L lower legs  Chronic and persistent condition with duration or expected duration over one year. Condition is symptomatic/ bothersome to patient. Not currently at goal.   Stasis  in the legs causes chronic leg swelling, which may result in itchy or painful rashes, skin discoloration, skin texture changes, and sometimes ulceration.  Recommend daily graduated compression hose/stockings- easiest to put on first thing in morning, remove at bedtime.  Elevate legs as much as possible. Avoid salt/sodium rich foods.    Return in about 3 months (around 08/24/2022) for [facility] recheck.Loraine Maple, CMA, am acting as scribe for Brendolyn Patty, MD.  Documentation: I have reviewed the above documentation for accuracy and completeness, and I agree with the above.  Brendolyn Patty MD

## 2022-05-25 NOTE — Progress Notes (Signed)
Patient for IR Biliary Drain Exchange on Wed 05/26/2022, I called and spoke with the patient's daughter [name] on the phone and gave pre-procedure instructions. Pt's daughter was made aware that patient needs to be here at 10a at the new entrance. I also called and talked to RN-Kristi at [facility] to let them know to tell transportation to come to the new entrance.  Pt's daughter stated understanding and so did [place], Therapist, sports [facility]).  Called 05/25/2022  Her Daughter Jeani Hawking is going to be here with her at the procedure.

## 2022-05-26 ENCOUNTER — Ambulatory Visit
Admission: RE | Admit: 2022-05-26 | Discharge: 2022-05-26 | Disposition: A | Discharge status: Home / Self Care | Payer: Medicare Other | Source: Ambulatory Visit | Attending: Interventional Radiology | Admitting: Interventional Radiology

## 2022-05-26 ENCOUNTER — Other Ambulatory Visit: Payer: Self-pay | Admitting: Interventional Radiology

## 2022-05-26 DIAGNOSIS — K8020 Calculus of gallbladder without cholecystitis without obstruction: Secondary | ICD-10-CM | POA: Diagnosis not present

## 2022-05-26 DIAGNOSIS — K819 Cholecystitis, unspecified: Secondary | ICD-10-CM

## 2022-05-26 DIAGNOSIS — Z434 Encounter for attention to other artificial openings of digestive tract: Secondary | ICD-10-CM | POA: Insufficient documentation

## 2022-05-26 HISTORY — PX: IR EXCHANGE BILIARY DRAIN: IMG6046

## 2022-05-26 MED ORDER — LIDOCAINE HCL 1 % IJ SOLN
INTRAMUSCULAR | Status: AC
  Administered 2022-05-26: 10 mL
  Filled 2022-05-26: qty 20

## 2022-05-26 MED ORDER — IOHEXOL 300 MG/ML  SOLN
8.0000 mL | Freq: Once | INTRAMUSCULAR | Status: AC | PRN
  Administered 2022-05-26: 8 mL

## 2022-05-31 ENCOUNTER — Non-Acute Institutional Stay (SKILLED_NURSING_FACILITY): Payer: Medicare Other | Admitting: Student

## 2022-05-31 ENCOUNTER — Encounter: Payer: Self-pay | Admitting: Student

## 2022-05-31 DIAGNOSIS — H9110 Presbycusis, unspecified ear: Secondary | ICD-10-CM | POA: Diagnosis not present

## 2022-05-31 DIAGNOSIS — I10 Essential (primary) hypertension: Secondary | ICD-10-CM | POA: Diagnosis not present

## 2022-05-31 DIAGNOSIS — I872 Venous insufficiency (chronic) (peripheral): Secondary | ICD-10-CM | POA: Diagnosis not present

## 2022-05-31 DIAGNOSIS — Z7189 Other specified counseling: Secondary | ICD-10-CM

## 2022-05-31 DIAGNOSIS — K82A1 Gangrene of gallbladder in cholecystitis: Secondary | ICD-10-CM | POA: Diagnosis not present

## 2022-05-31 NOTE — Progress Notes (Signed)
Location:  Other [facility] Room Number: [place] of Service:  SNF [phone]) Provider:  Dr. Amada Kingfisher, MD  Patient Care Team: Dewayne Shorter, MD as PCP - General (Family Medicine) Rolm Bookbinder, MD as Consulting Physician (Dermatology) [name], MD as Consulting Physician (Orthopedic Surgery) [name], MD as Referring Physician (Ophthalmology) Kipp Laurence, MD as Referring Physician (Audiology) Wandra Mannan, DMD as Referring Physician (Dentistry)  Extended Emergency Contact Information Primary Emergency Contact: Cloyd Stagers of [place] Phone: [phone] Relation: Daughter Secondary Emergency Contact: Marcello Moores Mobile Phone: [phone] Relation: Daughter  Code Status:  DNR Goals of care: Advanced Directive information    05/31/2022   10:05 AM  Advanced Directives  Does Patient Have a Medical Advance Directive? Yes  Type of Paramedic of [place];Out of facility DNR (pink MOST or yellow form)  Does patient want to make changes to medical advance directive? No - Patient declined  Copy of [place] in Chart? Yes - validated most recent copy scanned in chart (See row information)     Chief Complaint  Patient presents with   Acute Visit    HPI:  Pt is a 89 y.o. female seen today for an acute visit for a routine visit and acute visit   She had her tube changed for her ostomy and it has been very tender. She just wants opinion and permission to not   She took a tylenol and it helped, but it's hurting more than usual. "They think they are so cute because they are doing it without an anesthetic."   It was nice until the doctor did it.   She wants the tube out and sewn up. She doesn't mind going to appointments, just doesn't want to go to the "damn" hospital. She doesn't want a clinic option, because she doesn't   Past  Medical History:  Diagnosis Date   Acute gangrenous cholecystitis 02/12/2020   Arthritis    Atypical nevi 09/2014   lentiginous dysplastic nevus with mod atypia ([place])   Basal cell carcinoma 08/12/2017   R distal dorsum nose    Cholecystitis with gangrene of gallbladder    Diverticulitis    DNR (do not resuscitate)    Esophageal stricture 2012   GERD (gastroesophageal reflux disease)    HLD (hyperlipidemia)    did not tolerate statin, stopped checking   HTN (hypertension)    Hypokalemia    Hypomagnesemia    Hyponatremia    Leg wound, left, subsequent encounter 11/12/2017   Malignant melanoma ([facility]) 2015, 2017   R chin s/p excision ([place]) R upper back [name])   Malignant melanoma ([place]) 07/27/19 WLE at [facility]   R lat deltoid   Multiple rib fractures 11/02/2017   Osteoporosis, post-menopausal    bisphosphonate caused dysphagia, requests defer DEXA for now   SCC (squamous cell carcinoma) 03/16/2022   R mid cheek, txt with 46fu/calcipotriene   SCC (squamous cell carcinoma) 04/26/2022   L lower calf,EDC   SCC (squamous cell carcinoma) 04/26/2022   R ant ankle, EDC   Seasonal allergies    Sepsis ([place] 02/12/2020   Skin lesion 08/17/2018   Squamous cell carcinoma of skin 08/21/2018   L med leg below knee   Squamous cell carcinoma of skin 01/20/2017   R pretibial lateral   Squamous cell carcinoma of skin 09/25/2020   Left med lower leg above ankle - EDC   Squamous cell  carcinoma of skin 09/09/2021   L ankle, EDC   Wears hearing aid    Past Surgical History:  Procedure Laterality Date   BUNIONECTOMY Right 2001   CARPAL TUNNEL RELEASE Bilateral 1974   CATARACT EXTRACTION Bilateral    [place]   IR CHOLANGIOGRAM EXISTING TUBE  04/04/2020   IR CHOLANGIOGRAM EXISTING TUBE  01/19/2021   IR EXCHANGE BILIARY DRAIN  02/26/2021   IR EXCHANGE BILIARY DRAIN  09/01/2021   IR EXCHANGE BILIARY DRAIN  12/04/2021   IR EXCHANGE BILIARY DRAIN  02/24/2022   IR EXCHANGE BILIARY DRAIN   05/26/2022   IR RADIOLOGIST EVAL & MGMT  01/22/2021   IR REMOVAL OF CALCULI/DEBRIS BILIARY DUCT/GB  02/26/2021   TONSILLECTOMY  1928    Allergies  Allergen Reactions   Other Rash and Other (See Comments)    Silk Suture - redness and severe pain   Bisphosphonates Other (See Comments)    Dysphagia to oral bisphosphonates   Codeine Nausea And Vomiting   Statins Other (See Comments)    Muscle aches   Sulfa Antibiotics     Can't recall reaction   Procaine Hcl Palpitations    Outpatient Encounter Medications as of 05/31/2022  Medication Sig   acetaminophen (TYLENOL) 500 MG tablet Take 500 mg by mouth every 4 (four) hours as needed for mild pain.   ammonium lactate (AMLACTIN) 12 % cream Apply 1 Application topically as needed for dry skin.   bisacodyl (DULCOLAX) 10 MG suppository Place 10 mg rectally daily as needed for moderate constipation.   carbamide peroxide (DEBROX) 6.5 % OTIC solution Place 5 drops into both ears as needed.   Emollient (GOLD BOND ULT ROUGH/BUMPY SKIN) CREA Apply topically 2 (two) times daily. Apply to trunk and extremities topically every day and evening   magnesium hydroxide (MILK OF MAGNESIA) 400 MG/5ML suspension Take 30 mLs by mouth daily as needed for mild constipation.   Menthol, Topical Analgesic, (EUCERIN ITCH RELIEF) 0.1 % LOTN Apply 1 Application topically every 8 (eight) hours as needed (For itching).   mupirocin ointment (BACTROBAN) 2 % Apply 1 Application topically daily. Qd to biopsy sites on right cheek, right anterior ankle and left lower calf until healed   nepafenac (ILEVRO) 0.3 % ophthalmic suspension Place 1 drop into the left eye daily.    polyethylene glycol (MIRALAX / GLYCOLAX) 17 g packet Take 17 g by mouth daily as needed for moderate constipation.   Pramoxine-Menthol (GOLD BOND RAPID RELIEF) 1-1 % CREA Apply to feet, toes and other affected areas as needed for itch.   prednisoLONE acetate (PRED MILD) 0.12 % ophthalmic suspension Place 1 drop into  the left eye daily.    Propylene Glycol (SYSTANE COMPLETE OP) Place 1 drop into the left eye every 8 (eight) hours as needed (dry eyes).   Vitamin D, Ergocalciferol, (DRISDOL) 1.25 MG (50000 UNIT) CAPS capsule Take 50,000 Units by mouth every 30 (thirty) days. Give on the 4th of every month.   [DISCONTINUED] magnesium hydroxide (MILK OF MAGNESIA) 400 MG/5ML suspension Take 30 mLs by mouth every 12 (twelve) hours as needed for mild constipation.   No facility-administered encounter medications on file as of 05/31/2022.    Review of Systems  All other systems reviewed and are negative.   Immunization History  Administered Date(s) Administered   Fluad Quad(high Dose 65+) 03/22/2019   Influenza, High Dose Seasonal PF 03/09/2022   Influenza,inj,Quad PF,6+ Mos 03/27/2013, 02/08/2017, 02/09/2018   Influenza-Unspecified 02/21/2014   Moderna Covid-19 Vaccine Bivalent Booster  35yr & up 02/13/2021   Moderna SARS-COV2 Booster Vaccination 08/20/2020   Moderna Sars-Covid-2 Vaccination 06/05/2019, 07/03/2019, 04/02/2022   Pfizer Covid-19 Vaccine Bivalent Booster 139yr& up 02/13/2021   Pneumococcal Polysaccharide-23 03/20/2012   Pertinent  Health Maintenance Due  Topic Date Due   INFLUENZA VACCINE  Completed   DEXA SCAN  Completed      02/15/2020    9:00 AM 02/15/2020    9:49 AM 04/03/2020    9:55 AM 04/04/2020   10:58 AM 02/26/2021    7:29 AM  Fall Risk  Falls in the past year?   0    Was there an injury with Fall?   0    Fall Risk Category Calculator   0    Fall Risk Category   Low    Patient Fall Risk Level High fall risk High fall risk High fall risk High fall risk Moderate fall risk   Functional Status Survey:    Vitals:   05/31/22 0957  BP: 136/71  Pulse: 71  Resp: 18  Temp: (!) 97.3 F (36.3 C)  SpO2: 95%  Weight: 127 lb 9.6 oz (57.9 kg)  Height: '5\' 3"'$  (1.6 m)   Body mass index is 22.6 kg/m. Physical Exam Vitals reviewed.  Constitutional:      Appearance: Normal  appearance.  Cardiovascular:     Rate and Rhythm: Normal rate.  Pulmonary:     Effort: Pulmonary effort is normal.  Abdominal:     Palpations: Abdomen is soft.     Comments: Photo of drain below  Neurological:     General: No focal deficit present.     Mental Status: She is alert and oriented to person, place, and time.  Psychiatric:        Mood and Affect: Mood normal.     Labs reviewed: No results for input(s): "NA", "K", "CL", "CO2", "GLUCOSE", "BUN", "CREATININE", "CALCIUM", "MG", "PHOS" in the last 8760 hours. No results for input(s): "AST", "ALT", "ALKPHOS", "BILITOT", "PROT", "ALBUMIN" in the last 8760 hours. No results for input(s): "WBC", "NEUTROABS", "HGB", "HCT", "MCV", "PLT" in the last 8760 hours. Lab Results  Component Value Date   TSH 2.721 10/11/2017   No results found for: "HGBA1C" Lab Results  Component Value Date   CHOL 271 (H) 09/17/2013   HDL 49.10 09/17/2013   LDLCALC 200 (H) 09/17/2013   TRIG 110.0 09/17/2013   CHOLHDL 6 09/17/2013    Significant Diagnostic Results in last 30 days:  IR EXCHANGE BILIARY DRAIN  Result Date: 05/26/2022 INDICATION: Chronic indwelling cholecystostomy tube From previous exchange notes, "[age]ear old female with history of cholelithiasis and choledocholithiasis with chronic indwelling cholecystostomy drain status post choledochoscope-assisted percutaneous gallstone retrieval on 02/26/2021 which was largely successful. The patient has persistent indwelling cholecystostomy tube as she has previously stated she does not want any further procedures done and does not mind having the tube in place. She presents today for routine check and exchange." EXAM: Exchange of percutaneous cholecystostomy tube using fluoroscopic guidance MEDICATIONS: None ANESTHESIA/SEDATION: Local analgesia FLUOROSCOPY: Radiation Exposure Index (as provided by the fluoroscopic device): 1.2 minutes (9 mGy) COMPLICATIONS: None immediate. PROCEDURE: Informed written  consent was obtained from the patient after a thorough discussion of the procedural risks, benefits and alternatives. All questions were addressed. Maximal Sterile Barrier Technique was utilized including caps, mask, sterile gowns, sterile gloves, sterile drape, hand hygiene and skin antiseptic. A timeout was performed prior to the initiation of the procedure. The patient was placed supine on the exam table.  The right abdomen was prepped and draped in the standard sterile fashion with inclusion of the existing percutaneous cholecystostomy tube within the sterile field. Injection of the percutaneous cholecystostomy tube demonstrated appropriate location within the gallbladder lumen. Cystic duct is noted to be patent. Over an Amplatz wire, the locking loop was released and the existing cholecystostomy tube was exchanged for a new 14 French locking cholecystostomy tube. Locking loop was formed within the gallbladder lumen, and location was again confirmed with injection of contrast material. This again demonstrated a patent cystic duct. A small filling defect in the distal common bile duct is identified, compatible with previously There remains some passage of contrast material through the ampulla into the small bowel, compatible with a partial obstruction. The drainage catheter was secured to the skin using a Prolene suture and a dressing. It was attached to bag drainage. The patient tolerated the procedure well without immediate complication. IMPRESSION: 1. Successful exchange of percutaneous cholecystostomy tube for a new, similar 14 French cholecystostomy tube. Cholecystostomy tube placed to bag drainage. 2. As previously noted, a small gallstone remains in the distal common bile duct compatible with partially obstructive choledocholithiasis. Given these findings, recommend continued chronic drainage of the gallbladder at this time. Patient will be scheduled for routine check and change in 12 weeks. Electronically  Signed   By: Albin Felling M.D.   On: 05/26/2022 15:46    Assessment/Plan 1. Gangrene of gallbladder in cholecystitis Patient has had increased pain with her insertion site. Some surrounding irritation potentially due to the presence of a stitch that has caused irritation in the past. Patient states she no longer wants to have her drains changed. She states she would prefer to extend the changes until 6 months. Discussed with patient at length concern that she needs her drain changed more regularly to prevent infection. She states she is okay with increased risk of infection, she prefers comfort in this situation. Sent message to VIR to request input regarding patient's plan of care.   2. Essential hypertension BP well-controlled without medications at this time.   3. Chronic venous insufficiency Continued issues with leg swelling and tenderness. Compression as tolerated. Elevation as tolerated. CTM.   4. Presbycusis, unspecified laterality HA at bedside  5. Goals of care, counseling/discussion Patient is able to clearly state the risk and benefit of leaving the drain in place without intervention, and states she is okay with worsening of symptoms. She is unable to clearly state at this time if she wants to go back to the hospital. Will need further discussion with Palliative care and at follow up pending recs from [place].     Family/ staff Communication: nursing  Labs/tests ordered:  none

## 2022-07-01 ENCOUNTER — Non-Acute Institutional Stay (SKILLED_NURSING_FACILITY): Payer: Medicare Other | Admitting: Nurse Practitioner

## 2022-07-01 ENCOUNTER — Encounter: Payer: Self-pay | Admitting: Nurse Practitioner

## 2022-07-01 DIAGNOSIS — K82A1 Gangrene of gallbladder in cholecystitis: Secondary | ICD-10-CM | POA: Diagnosis not present

## 2022-07-01 NOTE — Progress Notes (Signed)
Location:   [place] Room Number: 207A Place of Service:  SNF [phone]) Provider:  Sherrie Mustache, NP  Dewayne Shorter, MD  Patient Care Team: Dewayne Shorter, MD as PCP - General (Family Medicine) Rolm Bookbinder, MD as Consulting Physician (Dermatology) [name], MD as Consulting Physician (Orthopedic Surgery) [name], MD as Referring Physician (Ophthalmology) Kipp Laurence, MD as Referring Physician (Audiology) Wandra Mannan, DMD as Referring Physician (Dentistry)  Extended Emergency Contact Information Primary Emergency Contact: Cloyd Stagers of [place] Phone: [phone] Relation: Daughter Secondary Emergency Contact: Marcello Moores Mobile Phone: [phone] Relation: Daughter  Code Status:  DNR Goals of care: Advanced Directive information    07/01/2022    3:32 PM  Advanced Directives  Does Patient Have a Medical Advance Directive? Yes  Type of Paramedic of [place];Out of facility DNR (pink MOST or yellow form)  Does patient want to make changes to medical advance directive? No - Patient declined  Copy of [place] in Chart? Yes - validated most recent copy scanned in chart (See row information)  Pre-existing out of facility DNR order (yellow form or pink MOST form) Yellow form placed in chart (order not valid for inpatient use)     Chief Complaint  Patient presents with   Acute Visit    Pain in cholecystectomy tube    HPI:  Pt is a 89 y.o. female seen today for an acute visit for pain around chole drain.  Pt with hx of chronic cholecystitis with chronic drain Nursing reports over the past few days pt has been complaining of more pain around the drain site.  Has some increase in drainage around the site but draining well into the bag as well. Sutures were removed due to irritation at suture site.  No fevers or chills.    Past Medical  History:  Diagnosis Date   Acute gangrenous cholecystitis 02/12/2020   Arthritis    Atypical nevi 09/2014   lentiginous dysplastic nevus with mod atypia ([place])   Basal cell carcinoma 08/12/2017   R distal dorsum nose    Cholecystitis with gangrene of gallbladder    Diverticulitis    DNR (do not resuscitate)    Esophageal stricture 2012   GERD (gastroesophageal reflux disease)    HLD (hyperlipidemia)    did not tolerate statin, stopped checking   HTN (hypertension)    Hypokalemia    Hypomagnesemia    Hyponatremia    Leg wound, left, subsequent encounter 11/12/2017   Malignant melanoma ([place]) 2015, 2017   R chin s/p excision ([place]) R upper back [name])   Malignant melanoma ([place]) 07/27/19 WLE at [facility]   R lat deltoid   Multiple rib fractures 11/02/2017   Osteoporosis, post-menopausal    bisphosphonate caused dysphagia, requests defer DEXA for now   SCC (squamous cell carcinoma) 03/16/2022   R mid cheek, txt with 47fu/calcipotriene   SCC (squamous cell carcinoma) 04/26/2022   L lower calf,EDC   SCC (squamous cell carcinoma) 04/26/2022   R ant ankle, EDC   Seasonal allergies    [place] 02/12/[place]   Skin lesion 08/17/2018   Squamous cell carcinoma of skin 08/21/2018   L med leg below knee   Squamous cell carcinoma of skin 01/20/2017   R pretibial lateral   Squamous cell carcinoma of skin 09/25/2020   Left med lower leg above ankle - EDC   Squamous cell carcinoma of skin 09/09/2021   L  ankle, EDC   Wears hearing aid    Past Surgical History:  Procedure Laterality Date   BUNIONECTOMY Right 2001   CARPAL TUNNEL RELEASE Bilateral 1974   CATARACT EXTRACTION Bilateral    [place]   IR CHOLANGIOGRAM EXISTING TUBE  04/04/2020   IR CHOLANGIOGRAM EXISTING TUBE  01/19/2021   IR EXCHANGE BILIARY DRAIN  02/26/2021   IR EXCHANGE BILIARY DRAIN  09/01/2021   IR EXCHANGE BILIARY DRAIN  12/04/2021   IR EXCHANGE BILIARY DRAIN  02/24/2022   IR EXCHANGE BILIARY DRAIN  05/26/2022   IR  RADIOLOGIST EVAL & MGMT  01/22/2021   IR REMOVAL OF CALCULI/DEBRIS BILIARY DUCT/GB  02/26/2021   TONSILLECTOMY  1928    Allergies  Allergen Reactions   Other Rash and Other (See Comments)    Silk Suture - redness and severe pain   Bisphosphonates Other (See Comments)    Dysphagia to oral bisphosphonates   Codeine Nausea And Vomiting   Statins Other (See Comments)    Muscle aches   Sulfa Antibiotics     Can't recall reaction   Procaine Hcl Palpitations    Allergies as of 07/01/2022       Reactions   Other Rash, Other (See Comments)   Silk Suture - redness and severe pain   Bisphosphonates Other (See Comments)   Dysphagia to oral bisphosphonates   Codeine Nausea And Vomiting   Statins Other (See Comments)   Muscle aches   Sulfa Antibiotics    Can't recall reaction   Procaine Hcl Palpitations        Medication List        Accurate as of July 01, 2022  3:40 PM. If you have any questions, ask your nurse or doctor.          STOP taking these medications    mupirocin ointment 2 % Commonly known as: BACTROBAN Stopped by: Lauree Chandler, NP       TAKE these medications    acetaminophen 500 MG tablet Commonly known as: TYLENOL Take 500 mg by mouth every 4 (four) hours as needed for mild pain.   ammonium lactate 12 % cream Commonly known as: AMLACTIN Apply 1 Application topically as needed for dry skin.   bisacodyl 10 MG suppository Commonly known as: DULCOLAX Place 10 mg rectally daily as needed for moderate constipation.   carbamide peroxide 6.5 % OTIC solution Commonly known as: DEBROX Place 5 drops into both ears as needed.   Eucerin Itch Relief 0.1 % Lotn Generic drug: Menthol (Topical Analgesic) Apply 1 Application topically every 8 (eight) hours as needed (For itching).   Gold Bond Rapid Relief 1-1 % Crea Generic drug: Pramoxine-Menthol Apply to feet, toes and other affected areas as needed for itch.   Gold Bond Ult Rough/Bumpy Skin  Crea Apply topically 2 (two) times daily. Apply to trunk and extremities topically every day and evening   Ilevro 0.3 % ophthalmic suspension Generic drug: nepafenac Place 1 drop into the left eye daily.   Milk of Magnesia 400 MG/5ML suspension Generic drug: magnesium hydroxide Take 30 mLs by mouth daily as needed for mild constipation.   polyethylene glycol 17 g packet Commonly known as: MIRALAX / GLYCOLAX Take 17 g by mouth daily as needed for moderate constipation.   prednisoLONE acetate 0.12 % ophthalmic suspension Commonly known as: PRED MILD Place 1 drop into the left eye daily.   SYSTANE COMPLETE OP Place 1 drop into the left eye every 8 (eight) hours as needed (  dry eyes).   Vitamin D (Ergocalciferol) 1.25 MG (50000 UNIT) Caps capsule Commonly known as: DRISDOL Take 50,000 Units by mouth every 30 (thirty) days. Give on the 4th of every month.        Review of Systems  Constitutional:  Negative for activity change, appetite change and fatigue.  Gastrointestinal:  Positive for abdominal pain. Negative for abdominal distention, constipation, diarrhea, nausea and vomiting.  Psychiatric/Behavioral:  Positive for confusion (baseline).     Immunization History  Administered Date(s) Administered   Fluad Quad(high Dose 65+) 03/22/2019   Influenza, High Dose Seasonal PF 03/09/2022   Influenza,inj,Quad PF,6+ Mos 03/27/2013, 02/08/2017, 02/09/2018   Influenza-Unspecified 02/21/2014   Moderna Covid-19 Vaccine Bivalent Booster 5yr & up 02/13/2021   Moderna SARS-COV2 Booster Vaccination 08/20/2020   Moderna Sars-Covid-2 Vaccination 06/05/2019, 07/03/2019, 04/02/2022   Pfizer Covid-19 Vaccine Bivalent Booster 180yr& up 02/13/2021   Pneumococcal Polysaccharide-23 03/20/2012   Pertinent  Health Maintenance Due  Topic Date Due   INFLUENZA VACCINE  Completed   DEXA SCAN  Completed      02/15/2020    9:00 AM 02/15/2020    9:49 AM 04/03/2020    9:55 AM 04/04/2020   10:58  AM 02/26/2021    7:29 AM  Fall Risk  Falls in the past year?   0    Was there an injury with Fall?   0    Fall Risk Category Calculator   0    Fall Risk Category (Retired)   Low    (RETIRED) Patient Fall Risk Level High fall risk High fall risk High fall risk High fall risk Moderate fall risk   Functional Status Survey:    Vitals:   07/01/22 1529  BP: 135/77  Pulse: 71  Resp: 16  Temp: 98 F (36.7 C)  SpO2: 96%  Weight: 126 lb (57.2 kg)  Height: '5\' 3"'$  (1.6 m)   Body mass index is 22.32 kg/m. Physical Exam Constitutional:      Appearance: Normal appearance.  Pulmonary:     Effort: Pulmonary effort is normal.  Abdominal:     Comments: Chole drain noted to right upper quad, mild erythema noted around, slight tenderness around.  Green drainage noted to gauze.    Neurological:     Mental Status: She is alert. Mental status is at baseline.  Psychiatric:        Mood and Affect: Mood normal.     Labs reviewed: No results for input(s): "NA", "K", "CL", "CO2", "GLUCOSE", "BUN", "CREATININE", "CALCIUM", "MG", "PHOS" in the last 8760 hours. No results for input(s): "AST", "ALT", "ALKPHOS", "BILITOT", "PROT", "ALBUMIN" in the last 8760 hours. No results for input(s): "WBC", "NEUTROABS", "HGB", "HCT", "MCV", "PLT" in the last 8760 hours. Lab Results  Component Value Date   TSH 2.721 10/11/2017   No results found for: "HGBA1C" Lab Results  Component Value Date   CHOL 271 (H) 09/17/2013   HDL 49.10 09/17/2013   LDLCALC 200 (H) 09/17/2013   TRIG 110.0 09/17/2013   CHOLHDL 6 09/17/2013    Significant Diagnostic Results in last 30 days:  No results found.  Assessment/Plan 1. Gangrene of gallbladder in cholecystitis -mild tenderness to abdomen. Soft on exam with positive Bowel sounds.  -she did not allow for VS prior to visit but will have staff take VS BID for 7 days to monitor -cbc and cmp in am -KUB to evaluate.  -to notify for changes.   [facility]  [phone]  

## 2022-07-08 LAB — BASIC METABOLIC PANEL
BUN: 18 (ref 4–21)
CO2: 26 — AB (ref 13–22)
Chloride: 106 (ref 99–108)
Creatinine: 0.8 (ref 0.5–1.1)
Glucose: 89
Potassium: 4.1 mEq/L (ref 3.5–5.1)
Sodium: 139 (ref 137–147)

## 2022-07-08 LAB — CBC AND DIFFERENTIAL
HCT: 35 — AB (ref 36–46)
Hemoglobin: 11.6 — AB (ref 12.0–16.0)
Neutrophils Absolute: 3459
Platelets: 266 10*3/uL (ref 150–400)
WBC: 6.1

## 2022-07-08 LAB — COMPREHENSIVE METABOLIC PANEL
Albumin: 4 (ref 3.5–5.0)
Calcium: 9.4 (ref 8.7–10.7)
Globulin: 2.7
eGFR: 65

## 2022-07-08 LAB — HEPATIC FUNCTION PANEL
ALT: 4 U/L — AB (ref 7–35)
AST: 14 (ref 13–35)
Alkaline Phosphatase: 63 (ref 25–125)
Bilirubin, Total: 0.6

## 2022-07-08 LAB — CBC: RBC: 3.64 — AB (ref 3.87–5.11)

## 2022-07-22 ENCOUNTER — Non-Acute Institutional Stay (SKILLED_NURSING_FACILITY): Payer: Medicare Other | Admitting: Nurse Practitioner

## 2022-07-22 ENCOUNTER — Encounter: Payer: Self-pay | Admitting: Nurse Practitioner

## 2022-07-22 DIAGNOSIS — K811 Chronic cholecystitis: Secondary | ICD-10-CM

## 2022-07-22 DIAGNOSIS — K5900 Constipation, unspecified: Secondary | ICD-10-CM

## 2022-07-22 DIAGNOSIS — I872 Venous insufficiency (chronic) (peripheral): Secondary | ICD-10-CM | POA: Diagnosis not present

## 2022-07-22 DIAGNOSIS — E559 Vitamin D deficiency, unspecified: Secondary | ICD-10-CM

## 2022-07-22 DIAGNOSIS — M1712 Unilateral primary osteoarthritis, left knee: Secondary | ICD-10-CM

## 2022-07-22 DIAGNOSIS — I10 Essential (primary) hypertension: Secondary | ICD-10-CM | POA: Diagnosis not present

## 2022-07-22 DIAGNOSIS — H9110 Presbycusis, unspecified ear: Secondary | ICD-10-CM

## 2022-07-22 NOTE — Progress Notes (Signed)
Location:  Other Nursing Home Room Number: 207A Place of Service:  SNF (31) Provider:  Sherrie Mustache, NP  Dewayne Shorter, MD  Patient Care Team: Dewayne Shorter, MD as PCP - General (Family Medicine) Rolm Bookbinder, MD as Consulting Physician (Dermatology) [name], MD as Consulting Physician (Orthopedic Surgery) [name], MD as Referring Physician (Ophthalmology) Kipp Laurence, MD as Referring Physician (Audiology) Wandra Mannan, DMD as Referring Physician (Dentistry)  Extended Emergency Contact Information Primary Emergency Contact: Cloyd Stagers of [place] Phone: [phone] Relation: Daughter Secondary Emergency Contact: Marcello Moores Mobile Phone: [phone] Relation: Daughter  Code Status:  DNR Goals of care: Advanced Directive information    07/22/2022    9:34 AM  Advanced Directives  Does Patient Have a Medical Advance Directive? Yes  Type of Paramedic of [place];Out of facility DNR (pink MOST or yellow form)  Does patient want to make changes to medical advance directive? No - Patient declined  Copy of [place] in Chart? Yes - validated most recent copy scanned in chart (See row information)  Pre-existing out of facility DNR order (yellow form or pink MOST form) Yellow form placed in chart (order not valid for inpatient use)     Chief Complaint  Patient presents with   Medical Management of Chronic Issues    Routine Follow up Visit    Immunizations    Discussed the need for shingles vaccine and Pneumonia vaccine [facility] verified    HPI:  Pt is a 89 y.o. female seen today for medical management of chronic diseases.   Pt with hx of chronic cholecystosis with drain,  GERD, VIt D def,   Past Medical History:  Diagnosis Date   Acute gangrenous cholecystitis 02/12/2020   Arthritis    Atypical nevi 09/2014   lentiginous dysplastic nevus with mod atypia ([place])    Basal cell carcinoma 08/12/2017   R distal dorsum nose    Cholecystitis with gangrene of gallbladder    Diverticulitis    DNR (do not resuscitate)    Esophageal stricture 2012   GERD (gastroesophageal reflux disease)    HLD (hyperlipidemia)    did not tolerate statin, stopped checking   HTN (hypertension)    Hypokalemia    Hypomagnesemia    Hyponatremia    Leg wound, left, subsequent encounter 11/12/2017   Malignant melanoma ([place]) 2015, 2017   R chin s/p excision ([place]) R upper back [name])   Malignant melanoma ([place]) 07/27/19 WLE at [facility]   R lat deltoid   Multiple rib fractures 11/02/2017   Osteoporosis, post-menopausal    bisphosphonate caused dysphagia, requests defer DEXA for now   SCC (squamous cell carcinoma) 03/16/2022   R mid cheek, txt with 54fu/calcipotriene   SCC (squamous cell carcinoma) 04/26/2022   L lower calf,EDC   SCC (squamous cell carcinoma) 04/26/2022   R ant ankle, EDC   Seasonal allergies    Sepsis ([place] 02/12/2020   Skin lesion 08/17/2018   Squamous cell carcinoma of skin 08/21/2018   L med leg below knee   Squamous cell carcinoma of skin 01/20/2017   R pretibial lateral   Squamous cell carcinoma of skin 09/25/2020   Left med lower leg above ankle - EDC   Squamous cell carcinoma of skin 09/09/2021   L ankle, EDC   Wears hearing aid    Past Surgical History:  Procedure Laterality Date   BUNIONECTOMY Right 2001   CARPAL TUNNEL RELEASE Bilateral 1974  CATARACT EXTRACTION Bilateral    [place]   IR CHOLANGIOGRAM EXISTING TUBE  04/04/2020   IR CHOLANGIOGRAM EXISTING TUBE  01/19/2021   IR EXCHANGE BILIARY DRAIN  02/26/2021   IR EXCHANGE BILIARY DRAIN  09/01/2021   IR EXCHANGE BILIARY DRAIN  12/04/2021   IR EXCHANGE BILIARY DRAIN  02/24/2022   IR EXCHANGE BILIARY DRAIN  05/26/2022   IR RADIOLOGIST EVAL & MGMT  01/22/2021   IR REMOVAL OF CALCULI/DEBRIS BILIARY DUCT/GB  02/26/2021   TONSILLECTOMY  1928    Allergies  Allergen Reactions   Other Rash  and Other (See Comments)    Silk Suture - redness and severe pain   Bisphosphonates Other (See Comments)    Dysphagia to oral bisphosphonates   Codeine Nausea And Vomiting   Statins Other (See Comments)    Muscle aches   Sulfa Antibiotics     Can't recall reaction   Procaine Hcl Palpitations    Outpatient Encounter Medications as of 07/22/2022  Medication Sig   acetaminophen (TYLENOL) 500 MG tablet Take 500 mg by mouth every 4 (four) hours as needed for mild pain.   ammonium lactate (AMLACTIN) 12 % cream Apply 1 Application topically as needed for dry skin.   bisacodyl (DULCOLAX) 10 MG suppository Place 10 mg rectally daily as needed for moderate constipation.   carbamide peroxide (DEBROX) 6.5 % OTIC solution Place 5 drops into both ears as needed.   Emollient (GOLD BOND ULT ROUGH/BUMPY SKIN) CREA Apply topically 2 (two) times daily. Apply to trunk and extremities topically every day and evening   magnesium hydroxide (MILK OF MAGNESIA) 400 MG/5ML suspension Take 30 mLs by mouth daily as needed for mild constipation.   Menthol, Topical Analgesic, (EUCERIN ITCH RELIEF) 0.1 % LOTN Apply 1 Application topically every 8 (eight) hours as needed (For itching).   nepafenac (ILEVRO) 0.3 % ophthalmic suspension Place 1 drop into the left eye daily.    polyethylene glycol (MIRALAX / GLYCOLAX) 17 g packet Take 17 g by mouth daily as needed for moderate constipation.   Pramoxine-Menthol (GOLD BOND RAPID RELIEF) 1-1 % CREA Apply to feet, toes and other affected areas as needed for itch.   prednisoLONE acetate (PRED MILD) 0.12 % ophthalmic suspension Place 1 drop into the left eye daily.    Propylene Glycol (SYSTANE COMPLETE OP) Place 1 drop into the left eye every 8 (eight) hours as needed (dry eyes).   Vitamin D, Ergocalciferol, (DRISDOL) 1.25 MG (50000 UNIT) CAPS capsule Take 50,000 Units by mouth every 30 (thirty) days. Give on the 4th of every month.   No facility-administered encounter medications  on file as of 07/22/2022.    Review of Systems  Immunization History  Administered Date(s) Administered   Fluad Quad(high Dose 65+) 03/22/2019   Influenza, High Dose Seasonal PF 03/09/2022   Influenza,inj,Quad PF,6+ Mos 03/27/2013, 02/08/2017, 02/09/2018   Influenza-Unspecified 02/21/2014   Moderna Covid-19 Vaccine Bivalent Booster 3yr & up 02/13/2021   Moderna SARS-COV2 Booster Vaccination 08/20/2020   Moderna Sars-Covid-2 Vaccination 06/05/2019, 07/03/2019, 04/02/2022   Pfizer Covid-19 Vaccine Bivalent Booster 157yr& up 02/13/2021   Pneumococcal Polysaccharide-23 03/20/2012   Pertinent  Health Maintenance Due  Topic Date Due   INFLUENZA VACCINE  Completed   DEXA SCAN  Completed      02/15/2020    9:00 AM 02/15/2020    9:49 AM 04/03/2020    9:55 AM 04/04/2020   10:58 AM 02/26/2021    7:29 AM  Fall Risk  Falls in the past  year?   0    Was there an injury with Fall?   0    Fall Risk Category Calculator   0    Fall Risk Category (Retired)   Low    (RETIRED) Patient Fall Risk Level High fall risk High fall risk High fall risk High fall risk Moderate fall risk   Functional Status Survey:    Vitals:   07/22/22 0912  BP: (!) 145/79  Pulse: 74  Resp: 18  Temp: (!) 97.3 F (36.3 C)  TempSrc: Temporal  SpO2: 92%  Weight: 125 lb 12.8 oz (57.1 kg)  Height: '5\' 3"'$  (1.6 m)   Body mass index is 22.28 kg/m. Physical Exam  Labs reviewed: No results for input(s): "NA", "K", "CL", "CO2", "GLUCOSE", "BUN", "CREATININE", "CALCIUM", "MG", "PHOS" in the last 8760 hours. No results for input(s): "AST", "ALT", "ALKPHOS", "BILITOT", "PROT", "ALBUMIN" in the last 8760 hours. No results for input(s): "WBC", "NEUTROABS", "HGB", "HCT", "MCV", "PLT" in the last 8760 hours. Lab Results  Component Value Date   TSH 2.721 10/11/2017   No results found for: "HGBA1C" Lab Results  Component Value Date   CHOL 271 (H) 09/17/2013   HDL 49.10 09/17/2013   LDLCALC 200 (H) 09/17/2013   TRIG  110.0 09/17/2013   CHOLHDL 6 09/17/2013    Significant Diagnostic Results in last 30 days:  No results found.  Assessment/Plan 1. Essential hypertension -occasionally elevated bp, continue to monitor.   2. Chronic venous insufficiency -encouraged to elevate legs above level of heart as tolerates, low sodium diet, compression hose as tolerates (on in am, off in pm)  3. Constipation, unspecified constipation type -stable, continue PRN medications  4. Chronic cholecystitis -continues to have chronic drain, no signs of infection, continues to drain, followed by IR for drain change q 6 month  5. Vitamin D deficiency -continues on supplement  6. Presbycusis, unspecified laterality Continue supportive care.  7. Primary osteoarthritis of left knee -continues to use brace for support and has tylenol PRN.   Carlos American. [place], [facility] [phone]

## 2022-07-29 ENCOUNTER — Encounter: Payer: Self-pay | Admitting: Nurse Practitioner

## 2022-07-29 ENCOUNTER — Non-Acute Institutional Stay (SKILLED_NURSING_FACILITY): Payer: Medicare Other | Admitting: Nurse Practitioner

## 2022-07-29 DIAGNOSIS — K811 Chronic cholecystitis: Secondary | ICD-10-CM | POA: Diagnosis not present

## 2022-07-29 NOTE — Progress Notes (Signed)
Location:  Other Nursing Home Room Number: 207 A Place of Service:  SNF (31)  Mikko Lewellen K. Dewaine Oats, NP   Patient Care Team: Dewayne Shorter, MD as PCP - General (Family Medicine) Rolm Bookbinder, MD as Consulting Physician (Dermatology) [name], MD as Consulting Physician (Orthopedic Surgery) [name], MD as Referring Physician (Ophthalmology) Kipp Laurence, MD as Referring Physician (Audiology) Wandra Mannan, DMD as Referring Physician (Dentistry)  Extended Emergency Contact Information Primary Emergency Contact: Cloyd Stagers of [place] Phone: [phone] Relation: Daughter Secondary Emergency Contact: Marcello Moores Mobile Phone: [phone] Relation: Daughter  Goals of care: Advanced Directive information    07/29/2022    2:55 PM  Advanced Directives  Does Patient Have a Medical Advance Directive? Yes  Type of Paramedic of [place];Out of facility DNR (pink MOST or yellow form)  Does patient want to make changes to medical advance directive? No - Patient declined  Copy of [place] in Chart? Yes - validated most recent copy scanned in chart (See row information)  Pre-existing out of facility DNR order (yellow form or pink MOST form) Yellow form placed in chart (order not valid for inpatient use)     Chief Complaint  Patient presents with   Acute Visit    Pain at drain site     HPI:  Pt is a 89 y.o. female seen today for an acute visit for pain around drain site.  Staff comes to me and reports pt in severe pain around drain site. No fever or chills.  Pt sitting in room with hands at drain. She is able to get up and get to bed without difficulty.  No increase in drainage, redness or warmth at site.    Past Medical History:  Diagnosis Date   Acute gangrenous cholecystitis 02/12/2020   Arthritis    Atypical nevi 09/2014   lentiginous dysplastic nevus with mod atypia  ([place])   Basal cell carcinoma 08/12/2017   R distal dorsum nose    Cholecystitis with gangrene of gallbladder    Diverticulitis    DNR (do not resuscitate)    Esophageal stricture 2012   GERD (gastroesophageal reflux disease)    HLD (hyperlipidemia)    did not tolerate statin, stopped checking   HTN (hypertension)    Hypokalemia    Hypomagnesemia    Hyponatremia    Leg wound, left, subsequent encounter 11/12/2017   Malignant melanoma ([place]) 2015, 2017   R chin s/p excision ([place]) R upper back [name])   Malignant melanoma ([place]) 07/27/19 WLE at [facility]   R lat deltoid   Multiple rib fractures 11/02/2017   Osteoporosis, post-menopausal    bisphosphonate caused dysphagia, requests defer DEXA for now   SCC (squamous cell carcinoma) 03/16/2022   R mid cheek, txt with 56fu/calcipotriene   SCC (squamous cell carcinoma) 04/26/2022   L lower calf,EDC   SCC (squamous cell carcinoma) 04/26/2022   R ant ankle, EDC   Seasonal allergies    Sepsis [place] 02/12/2020   Skin lesion 08/17/2018   Squamous cell carcinoma of skin 08/21/2018   L med leg below knee   Squamous cell carcinoma of skin 01/20/2017   R pretibial lateral   Squamous cell carcinoma of skin 09/25/2020   Left med lower leg above ankle - EDC   Squamous cell carcinoma of skin 09/09/2021   L ankle, EDC   Wears hearing aid    Past Surgical History:  Procedure Laterality Date  BUNIONECTOMY Right 2001   CARPAL TUNNEL RELEASE Bilateral 1974   CATARACT EXTRACTION Bilateral    [place]   IR CHOLANGIOGRAM EXISTING TUBE  04/04/2020   IR CHOLANGIOGRAM EXISTING TUBE  01/19/2021   IR EXCHANGE BILIARY DRAIN  02/26/2021   IR EXCHANGE BILIARY DRAIN  09/01/2021   IR EXCHANGE BILIARY DRAIN  12/04/2021   IR EXCHANGE BILIARY DRAIN  02/24/2022   IR EXCHANGE BILIARY DRAIN  05/26/2022   IR RADIOLOGIST EVAL & MGMT  01/22/2021   IR REMOVAL OF CALCULI/DEBRIS BILIARY DUCT/GB  02/26/2021   TONSILLECTOMY  1928    Allergies  Allergen Reactions    Other Rash and Other (See Comments)    Silk Suture - redness and severe pain   Bisphosphonates Other (See Comments)    Dysphagia to oral bisphosphonates   Codeine Nausea And Vomiting   Statins Other (See Comments)    Muscle aches   Sulfa Antibiotics     Can't recall reaction   Procaine Hcl Palpitations    Outpatient Encounter Medications as of 07/29/2022  Medication Sig   acetaminophen (TYLENOL) 325 MG tablet Take 650 mg by mouth daily.   acetaminophen (TYLENOL) 500 MG tablet Take 500 mg by mouth every 4 (four) hours as needed for mild pain.   ammonium lactate (AMLACTIN) 12 % cream Apply 1 Application topically as needed for dry skin.   bisacodyl (DULCOLAX) 10 MG suppository Place 10 mg rectally daily as needed for moderate constipation.   carbamide peroxide (DEBROX) 6.5 % OTIC solution Place 5 drops into both ears as needed.   Emollient (GOLD BOND ULT ROUGH/BUMPY SKIN) CREA Apply topically 2 (two) times daily. Apply to trunk and extremities topically every day and evening   magnesium hydroxide (MILK OF MAGNESIA) 400 MG/5ML suspension Take 30 mLs by mouth daily as needed for mild constipation.   Menthol, Topical Analgesic, (EUCERIN ITCH RELIEF) 0.1 % LOTN Apply 1 Application topically every 8 (eight) hours as needed (For itching).   nepafenac (ILEVRO) 0.3 % ophthalmic suspension Place 1 drop into the left eye daily.    polyethylene glycol (MIRALAX / GLYCOLAX) 17 g packet Take 17 g by mouth daily as needed for moderate constipation.   Pramoxine-Menthol (GOLD BOND RAPID RELIEF) 1-1 % CREA Apply to feet, toes and other affected areas as needed for itch.   prednisoLONE acetate (PRED MILD) 0.12 % ophthalmic suspension Place 1 drop into the left eye daily.    Propylene Glycol (SYSTANE COMPLETE OP) Place 1 drop into the left eye every 8 (eight) hours as needed (dry eyes).   Vitamin D, Ergocalciferol, (DRISDOL) 1.25 MG (50000 UNIT) CAPS capsule Take 50,000 Units by mouth every 30 (thirty) days. Give  on the 4th of every month.   No facility-administered encounter medications on file as of 07/29/2022.    Review of Systems  Constitutional:  Negative for activity change, appetite change, fatigue and unexpected weight change.  HENT:  Negative for congestion and hearing loss.   Eyes: Negative.   Respiratory:  Negative for cough and shortness of breath.   Cardiovascular:  Negative for chest pain, palpitations and leg swelling.  Gastrointestinal:  Positive for abdominal pain (around site). Negative for constipation and diarrhea.  Musculoskeletal:  Negative for arthralgias and myalgias.  Skin:  Negative for color change and wound.  Psychiatric/Behavioral:  Negative for agitation, behavioral problems and confusion.     Immunization History  Administered Date(s) Administered   Fluad Quad(high Dose 65+) 03/22/2019   Influenza, High Dose Seasonal PF 03/09/2022  Influenza,inj,Quad PF,6+ Mos 03/27/2013, 02/08/2017, 02/09/2018   Influenza-Unspecified 02/21/2014   Moderna Covid-19 Vaccine Bivalent Booster 79yr & up 02/13/2021   Moderna SARS-COV2 Booster Vaccination 08/20/2020   Moderna Sars-Covid-2 Vaccination 06/05/2019, 07/03/2019, 04/02/2022   Pfizer Covid-19 Vaccine Bivalent Booster 157yr& up 02/13/2021   Pneumococcal Polysaccharide-23 03/20/2012   Pertinent  Health Maintenance Due  Topic Date Due   INFLUENZA VACCINE  Completed   DEXA SCAN  Completed      02/15/2020    9:00 AM 02/15/2020    9:49 AM 04/03/2020    9:55 AM 04/04/2020   10:58 AM 02/26/2021    7:29 AM  Fall Risk  Falls in the past year?   0    Was there an injury with Fall?   0    Fall Risk Category Calculator   0    Fall Risk Category (Retired)   Low    (RETIRED) Patient Fall Risk Level High fall risk High fall risk High fall risk High fall risk Moderate fall risk   Functional Status Survey:    Vitals:   07/29/22 1453  BP: 137/63  Pulse: 78  Resp: 18  Temp: 97.7 F (36.5 C)  Weight: 130 lb (59 kg)   Height: '5\' 3"'$  (1.6 m)   Body mass index is 23.03 kg/m. Physical Exam Constitutional:      General: She is not in acute distress.    Appearance: She is well-developed. She is not diaphoretic.  HENT:     Head: Normocephalic and atraumatic.     Mouth/Throat:     Pharynx: No oropharyngeal exudate.  Eyes:     Conjunctiva/sclera: Conjunctivae normal.     Pupils: Pupils are equal, round, and reactive to light.  Cardiovascular:     Rate and Rhythm: Normal rate and regular rhythm.     Heart sounds: Normal heart sounds.  Pulmonary:     Effort: Pulmonary effort is normal.     Breath sounds: Normal breath sounds.  Abdominal:     General: Bowel sounds are normal.     Palpations: Abdomen is soft.     Comments: Drain site normal. No swelling, drainage, redness or warmth noted. Mild tenderness around site.  Pt reports she feels better laying down and pain has improved.   Musculoskeletal:     Cervical back: Normal range of motion and neck supple.     Right lower leg: No edema.     Left lower leg: No edema.  Skin:    General: Skin is warm and dry.  Neurological:     Mental Status: She is alert.  Psychiatric:        Mood and Affect: Mood normal.     Labs reviewed: Recent Labs    07/08/22 0000  NA 139  K 4.1  CL 106  CO2 26*  BUN 18  CREATININE 0.8  CALCIUM 9.4   Recent Labs    07/08/22 0000  AST 14  ALT 4*  ALKPHOS 63  ALBUMIN 4.0   Recent Labs    07/08/22 0000  WBC 6.1  NEUTROABS 3,459.00  HGB 11.6*  HCT 35*  PLT 266   Lab Results  Component Value Date   TSH 2.721 10/11/2017   No results found for: "HGBA1C" Lab Results  Component Value Date   CHOL 271 (H) 09/17/2013   HDL 49.10 09/17/2013   LDLCALC 200 (H) 09/17/2013   TRIG 110.0 09/17/2013   CHOLHDL 6 09/17/2013    Significant Diagnostic Results in last 30  days:  No results found.  Assessment/Plan 1. Chronic cholecystitis Pain at drain site, no signs of injection or drain being dislodged.  Continues to have drain in bag without drainage to site.  Will have staff continue to monitor.  Pt reports pain improved with laying down but she prefers to be upright and pain has already seemed to improved by end of visit.  -will get KUB    Romyn Boswell K. [place], [facility] [phone]

## 2022-08-01 ENCOUNTER — Emergency Department: Payer: Medicare Other

## 2022-08-01 ENCOUNTER — Other Ambulatory Visit: Payer: Self-pay

## 2022-08-01 ENCOUNTER — Encounter: Payer: Self-pay | Admitting: Hematology and Oncology

## 2022-08-01 ENCOUNTER — Emergency Department
Admission: EM | Admit: 2022-08-01 | Discharge: 2022-08-01 | Disposition: A | Discharge status: Skilled Nursing Facility | Payer: Medicare Other | Attending: Emergency Medicine | Admitting: Emergency Medicine

## 2022-08-01 DIAGNOSIS — Y9301 Activity, walking, marching and hiking: Secondary | ICD-10-CM | POA: Insufficient documentation

## 2022-08-01 DIAGNOSIS — S80212A Abrasion, left knee, initial encounter: Secondary | ICD-10-CM | POA: Insufficient documentation

## 2022-08-01 DIAGNOSIS — S0101XA Laceration without foreign body of scalp, initial encounter: Secondary | ICD-10-CM | POA: Insufficient documentation

## 2022-08-01 DIAGNOSIS — Z23 Encounter for immunization: Secondary | ICD-10-CM | POA: Insufficient documentation

## 2022-08-01 DIAGNOSIS — W19XXXA Unspecified fall, initial encounter: Secondary | ICD-10-CM

## 2022-08-01 DIAGNOSIS — W01198A Fall on same level from slipping, tripping and stumbling with subsequent striking against other object, initial encounter: Secondary | ICD-10-CM | POA: Insufficient documentation

## 2022-08-01 DIAGNOSIS — Y92219 Unspecified school as the place of occurrence of the external cause: Secondary | ICD-10-CM | POA: Diagnosis not present

## 2022-08-01 LAB — BASIC METABOLIC PANEL
Anion gap: 10 (ref 5–15)
BUN: 19 mg/dL (ref 8–23)
CO2: 23 mmol/L (ref 22–32)
Calcium: 9.4 mg/dL (ref 8.9–10.3)
Chloride: 105 mmol/L (ref 98–111)
Creatinine, Ser: 0.86 mg/dL (ref 0.44–1.00)
GFR, Estimated: >60 mL/min (ref >60)
Glucose, Bld: 122 mg/dL — ABNORMAL HIGH (ref 70–99)
Potassium: 3.5 mmol/L (ref 3.5–5.1)
Sodium: 138 mmol/L (ref 135–145)

## 2022-08-01 LAB — HEPATIC FUNCTION PANEL
ALT: 10 U/L (ref 0–44)
AST: 19 U/L (ref 15–41)
Albumin: 4.1 g/dL (ref 3.5–5.0)
Alkaline Phosphatase: 71 U/L (ref 38–126)
Bilirubin, Direct: <0.1 mg/dL (ref 0.0–0.2)
Total Bilirubin: 0.7 mg/dL (ref 0.3–1.2)
Total Protein: 7.6 g/dL (ref 6.5–8.1)

## 2022-08-01 LAB — CBC WITH DIFFERENTIAL/PLATELET
Abs Immature Granulocytes: 0.04 10*3/uL (ref 0.00–0.07)
Basophils Absolute: 0 10*3/uL (ref 0.0–0.1)
Basophils Relative: 1 %
Eosinophils Absolute: 0.5 10*3/uL (ref 0.0–0.5)
Eosinophils Relative: 6 %
HCT: 37.3 % (ref 36.0–46.0)
Hemoglobin: 12 g/dL (ref 12.0–15.0)
Immature Granulocytes: 1 %
Lymphocytes Relative: 17 %
Lymphs Abs: 1.2 10*3/uL (ref 0.7–4.0)
MCH: 31.7 pg (ref 26.0–34.0)
MCHC: 32.2 g/dL (ref 30.0–36.0)
MCV: 98.4 fL (ref 80.0–100.0)
Monocytes Absolute: 0.8 10*3/uL (ref 0.1–1.0)
Monocytes Relative: 11 %
Neutro Abs: 4.6 10*3/uL (ref 1.7–7.7)
Neutrophils Relative %: 64 %
Platelets: 239 10*3/uL (ref 150–400)
RBC: 3.79 MIL/uL — ABNORMAL LOW (ref 3.87–5.11)
RDW: 13.2 % (ref 11.5–15.5)
WBC: 7.2 10*3/uL (ref 4.0–10.5)
nRBC: 0 % (ref 0.0–0.2)

## 2022-08-01 MED ORDER — LIDOCAINE-EPINEPHRINE 2 %-1:100000 IJ SOLN
5.0000 mL | Freq: Once | INTRAMUSCULAR | Status: AC
  Administered 2022-08-01: 5 mL
  Filled 2022-08-01: qty 1

## 2022-08-01 MED ORDER — TETANUS-DIPHTH-ACELL PERTUSSIS 5-2.5-18.5 LF-MCG/0.5 IM SUSY
0.5000 mL | PREFILLED_SYRINGE | Freq: Once | INTRAMUSCULAR | Status: AC
  Administered 2022-08-01: 0.5 mL via INTRAMUSCULAR
  Filled 2022-08-01: qty 0.5

## 2022-08-01 NOTE — ED Notes (Signed)
ED Provider at bedside. 

## 2022-08-01 NOTE — ED Notes (Signed)
Daylight savings. No documentation available between the hours of 2am to 3a

## 2022-08-01 NOTE — ED Triage Notes (Signed)
Pt had a mechanical fall on her way to the bathroom with her walker. Pt has a wound to the R forehead. Pt is very hard of hearing. Pt is oriented to where she is and what happened.

## 2022-08-01 NOTE — ED Provider Notes (Addendum)
[facility] Provider Note    Event Date/Time   First MD Initiated Contact with Patient 08/01/22 0103     (approximate)   History   Fall   HPI  Teresa Meyer is a 89 y.o. female with history of cholecystitis with gangrene of the gallbladder with continued drainage, melanoma, osteoporosis, venous insufficiency who comes in with concerns for a fall.  Patient reports a mechanical fall.  Patient reports that she was going to the bathroom with her walker when she lost her balance and fell.  She denies any LOC but did hit her head.  Denies any chest pain, shortness of breath.  Patient has laceration above her right eye.  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  I reviewed patient's documentation from her facility where she has a cataract noted in the left eye that was extracted.  She also gets eyedrops to her left eye at the facility.   Physical Exam   Triage Vital Signs: ED Triage Vitals  Enc Vitals Group     BP 08/01/22 0108 (!) 195/98     Pulse Rate 08/01/22 0108 (!) 107     Resp 08/01/22 0108 18     Temp --      Temp src --      SpO2 08/01/22 0108 94 %     Weight --      Height --      Head Circumference --      Peak Flow --      Pain Score 08/01/22 0109 0     Pain Loc --      Pain Edu? --      Excl. in [place]? --     Most recent vital signs: Vitals:   08/01/22 0108  BP: (!) 195/98  Pulse: (!) 107  Resp: 18  SpO2: 94%     General: Awake, no distress.  CV:  Good peripheral perfusion.  Resp:  Normal effort.  Abd:  No distention.  Patient has gallbladder tube in place Other:  Patient's left pupil does look irregular compared to the right pupil but she reports no vision out of her right eye and baseline vision out of her left eye. Is able to lift both legs up off the bed.  Abrasion noted on her left knee.   Full movement of arms without any tenderness.  No chest wall tenderness no abdominal tenderness.  She has got irregular laceration noted above her right eyebrow. EOMI  ED Results / Procedures / Treatments   Labs (all labs ordered are listed, but only abnormal results are displayed) Labs Reviewed  CBC WITH DIFFERENTIAL/PLATELET - Abnormal; Notable for the following components:      Result Value   RBC 3.79 (*)    All other components within normal limits  BASIC METABOLIC PANEL - Abnormal; Notable for the following components:   Glucose, Bld 122 (*)    All other components within normal limits  HEPATIC FUNCTION PANEL     EKG  My interpretation of EKG:  Normal sinus rate of 88 without any ST elevation or T wave inversions, PVC.  The reading is A-fib but patient does have P waves  RADIOLOGY I have reviewed the xray personally and interpreted no rib fractures  PROCEDURES:  Critical Care performed: No  ..  Laceration Repair  Date/Time: 08/01/2022 4:15 AM  Performed by: Vanessa [place], MD Authorized by: Vanessa [place], MD   Consent:    Consent obtained:  Verbal   Consent given by:  Patient   Risks discussed:  Infection, pain, retained foreign body, need for additional repair, poor cosmetic result, tendon damage, nerve damage, poor wound healing and vascular damage   Alternatives discussed:  No treatment Universal protocol:    Patient identity confirmed:  Verbally with patient Anesthesia:    Anesthesia method:  Local infiltration   Local anesthetic:  Lidocaine 2% WITH epi Laceration details:    Location:  Scalp   Scalp location:  Frontal   Length (cm):  3   Depth (mm):  2 Exploration:    Contaminated: no   Treatment:    Area cleansed with:  Saline   Irrigation volume:  100cc   Irrigation method:  Syringe Skin repair:    Repair method:  Sutures   Suture size:  6-0   Suture material:  Prolene   Number of sutures:  6 Approximation:    Approximation:  Close Repair type:     Repair type:  Simple Post-procedure details:    Dressing:  Open (no dressing)   Procedure completion:  Tolerated well, no immediate complications    MEDICATIONS ORDERED IN ED: Medications  Tdap (BOOSTRIX) injection 0.5 mL (0.5 mLs Intramuscular Given 08/01/22 0126)  lidocaine-EPINEPHrine (XYLOCAINE W/EPI) 2 %-1:100000 (with pres) injection 5 mL (5 mLs Other Given 08/01/22 0343)     IMPRESSION / MDM / [place] / ED COURSE  I reviewed the triage vital signs and the nursing notes.   Patient's presentation is most consistent with acute presentation with potential threat to life or bodily function.   Patient comes in with what sound like a mechanical fall given patient's age we will get some basic labs CT imaging to rule out any other acute abnormality.  I attempted to call 2 different family members to see if I can get a hold of anyone to ask about the people unfortunately nobody was able to pick up.  EKG ordered without any evidence of arrhythmia patient denies any chest pain or shortness of breath. Tdap updated  She denies any urinary symptoms and is afebrile with nromal white count. CT head face neck are all negative Hepatic function is normal CBC normal BMP normal  Patient's laceration was difficult to approximate given a chunk of the skin was missing and the friable skin Tearing but was able to at least close the hole with 6 sutures that would need to be removed.  I did call the facility and discussed her care with [facility] "mimi" that helps to take care of the patient.  She is in a memory care unit and does report that patient has had prior discrepancy in the look of the left pupil compared to the right and this is baseline for her. Denies this being new concern.   Updated them on patient's reassuring workup and the need for sutures to be removed in 5 days and they expressed understanding standing.  Patient be transported back to facility.   Pt ambulated prior to discharge  with nurse     FINAL CLINICAL IMPRESSION(S) / ED DIAGNOSES   Final diagnoses:  Fall, initial encounter  Laceration of scalp without foreign body, initial encounter     Rx / DC Orders   ED Discharge Orders     None  Note:  This document was prepared using Dragon voice recognition software and may include unintentional dictation errors.   Vanessa [place], MD 08/01/22 [id number]    Vanessa [place], MD 08/01/22 [id number]    Vanessa [place], MD 08/01/22 [phone]

## 2022-08-01 NOTE — ED Notes (Signed)
Pt was able to ambulate with the use of a walker which is her baseline.

## 2022-08-01 NOTE — ED Notes (Signed)
Patient transported to CT 

## 2022-08-01 NOTE — ED Notes (Signed)
Ems at bedside.

## 2022-08-01 NOTE — ED Notes (Signed)
Patient transported back from CT 

## 2022-08-01 NOTE — Discharge Instructions (Addendum)
CT and blood work was reassuring.  She will need the 6 sutures removed in about 5 days.  Return to the ER if she develops worsening symptoms or any other concerns

## 2022-08-02 ENCOUNTER — Non-Acute Institutional Stay (SKILLED_NURSING_FACILITY): Payer: Medicare Other | Admitting: Adult Health

## 2022-08-02 ENCOUNTER — Encounter: Payer: Self-pay | Admitting: Adult Health

## 2022-08-02 DIAGNOSIS — S01111S Laceration without foreign body of right eyelid and periocular area, sequela: Secondary | ICD-10-CM | POA: Diagnosis not present

## 2022-08-02 NOTE — Progress Notes (Signed)
Location:  [facility] Room Number: [place] of Service:  SNF [id number]) Provider:  Durenda Age, NP  PCP: Dewayne Shorter, MD  Patient Care Team: Dewayne Shorter, MD as PCP - General (Family Medicine) Rolm Bookbinder, MD as Consulting Physician (Dermatology) [name], MD as Consulting Physician (Orthopedic Surgery) [name], MD as Referring Physician (Ophthalmology) Kipp Laurence, MD as Referring Physician (Audiology) Wandra Mannan, DMD as Referring Physician (Dentistry)  Extended Emergency Contact Information Primary Emergency Contact: Cloyd Stagers of [place] Phone: [phone] Relation: Daughter Secondary Emergency Contact: Marcello Moores Mobile Phone: [phone] Relation: Daughter  Code Status:  DNR Goals of care: Advanced Directive information    08/02/2022    2:27 PM  Advanced Directives  Does Patient Have a Medical Advance Directive? Yes  Type of Paramedic of [place];Out of facility DNR (pink MOST or yellow form)  Does patient want to make changes to medical advance directive? No - Patient declined  Copy of [place] in Chart? Yes - validated most recent copy scanned in chart (See row information)     Chief Complaint  Patient presents with   Hospitalization Follow-up    ER Follow up    HPI:  Pt is a 89 y.o. female seen today for an acute visit for ER visit follow up. She has a PMH of cholecystitis with gangrene of the bladder, melanoma, osteoporosis and venous insufficiency. She is a long-term care resident of [facility]. She was brought to ER on 08/01/22 post fall. She reported going to the bathroom with her walker  and lost balance. She hit her head but denies LOC. She sustained a laceration on her right eyebrow which was sutured in th ER. CT head/face and neck were all negative. Hepatic function, CBC and BMP normal.  She was  seen in her room today. She denies pain.   Past Medical History:  Diagnosis Date   Acute gangrenous cholecystitis 02/12/2020   Arthritis    Atypical nevi 09/2014   lentiginous dysplastic nevus with mod atypia ([place])   Basal cell carcinoma 08/12/2017   R distal dorsum nose    Cholecystitis with gangrene of gallbladder    Diverticulitis    DNR (do not resuscitate)    Esophageal stricture 2012   GERD (gastroesophageal reflux disease)    HLD (hyperlipidemia)    did not tolerate statin, stopped checking   HTN (hypertension)    Hypokalemia    Hypomagnesemia    Hyponatremia    Leg wound, left, subsequent encounter 11/12/2017   Malignant melanoma ([place]) 2015, 2017   R chin s/p excision ([place]) R upper back [name])   Malignant melanoma ([place]) 07/27/19 WLE at [facility]   R lat deltoid   Multiple rib fractures 11/02/2017   Osteoporosis, post-menopausal    bisphosphonate caused dysphagia, requests defer DEXA for now   SCC (squamous cell carcinoma) 03/16/2022   R mid cheek, txt with 48f/u/calcipotriene   SCC (squamous cell carcinoma) 04/26/2022   L lower calf,EDC   SCC (squamous cell carcinoma) 04/26/2022   R ant ankle, EDC   Seasonal allergies    Sepsis ([place]) 02/12/2020   Skin lesion 08/17/2018   Squamous cell carcinoma of skin 08/21/2018   L med leg below knee   Squamous cell carcinoma of skin 01/20/2017   R pretibial lateral   Squamous cell carcinoma of skin 09/25/2020   Left med lower leg above ankle - [facility]  Squamous cell carcinoma of skin 09/09/2021   L ankle, ED[facility]   Wears hearing aid    Past Surgical History:  Procedure Laterality Date   BUNIONECTOMY Right 2001   CARPAL TUNNEL RELEASE Bilateral 1974   CATARACT EXTRACTION Bilateral    [place]   IR CHOLANGIOGRAM EXISTING TUBE  04/04/2020   IR CHOLANGIOGRAM EXISTING TUBE  01/19/2021   IR EXCHANGE BILIARY DRAIN  02/26/2021   IR EXCHANGE BILIARY DRAIN  09/01/2021   IR EXCHANGE BILIARY DRAIN  12/04/2021   IR EXCHANGE BILIARY  DRAIN  02/24/2022   IR EXCHANGE BILIARY DRAIN  05/26/2022   IR RADIOLOGIST EVAL & MGMT  01/22/2021   IR REMOVAL OF CALCULI/DEBRIS BILIARY DUCT/GB  02/26/2021   TONSILLECTOMY  1928    Allergies  Allergen Reactions   Other Rash and Other (See Comments)    Silk Suture - redness and severe pain   Bisphosphonates Other (See Comments)    Dysphagia to oral bisphosphonates   Codeine Nausea And Vomiting   Statins Other (See Comments)    Muscle aches   Sulfa Antibiotics     Can't recall reaction   Procaine Hcl Palpitations    Outpatient Encounter Medications as of 08/02/2022  Medication Sig   acetaminophen (TYLENOL) 500 MG tablet Take 500 mg by mouth every 4 (four) hours as needed for mild pain.   ammonium lactate (AMLACTIN) 12 % cream Apply 1 Application topically as needed for dry skin.   bisacodyl (DULCOLAX) 10 MG suppository Place 10 mg rectally daily as needed for moderate constipation.   carbamide peroxide (DEBROX) 6.5 % OTIC solution Place 5 drops into both ears as needed.   Emollient (GOLD BOND ULT ROUGH/BUMPY SKIN) CREA Apply topically 2 (two) times daily. Apply to trunk and extremities topically every day and evening   magnesium hydroxide (MILK OF MAGNESIA) 400 MG/5ML suspension Take 30 mLs by mouth daily as needed for mild constipation.   Menthol, Topical Analgesic, (EUCERIN ITCH RELIEF) 0.1 % LOTN Apply 1 Application topically every 8 (eight) hours as needed (For itching).   nepafenac (ILEVRO) 0.3 % ophthalmic suspension Place 1 drop into the left eye daily.    polyethylene glycol (MIRALAX / GLYCOLAX) 17 g packet Take 17 g by mouth daily as needed for moderate constipation.   Pramoxine-Menthol (GOLD BOND RAPID RELIEF) 1-1 % CREA Apply to feet, toes and other affected areas as needed for itch.   prednisoLONE acetate (PRED MILD) 0.12 % ophthalmic suspension Place 1 drop into the left eye daily.    Propylene Glycol (SYSTANE COMPLETE OP) Place 1 drop into the left eye every 8 (eight) hours  as needed (dry eyes).   Vitamin D, Ergocalciferol, (DRISDOL) 1.25 MG (50000 UNIT) CAPS capsule Take 50,000 Units by mouth every 30 (thirty) days. Give on the 4th of every month.   [DISCONTINUED] acetaminophen (TYLENOL) 325 MG tablet Take 650 mg by mouth daily.   No facility-administered encounter medications on file as of 08/02/2022.    Review of Systems  Constitutional:  Negative for appetite change, chills, fatigue and fever.  HENT:  Negative for congestion, hearing loss, rhinorrhea and sore throat.   Eyes: Negative.   Respiratory:  Negative for cough, shortness of breath and wheezing.   Cardiovascular:  Negative for chest pain, palpitations and leg swelling.  Gastrointestinal:  Negative for abdominal pain, constipation, diarrhea, nausea and vomiting.  Genitourinary:  Negative for dysuria.  Musculoskeletal:  Negative for arthralgias, back pain and myalgias.  Skin:  Negative for color change, rash  and wound.  Neurological:  Negative for dizziness, weakness and headaches.  Psychiatric/Behavioral:  Negative for behavioral problems. The patient is not nervous/anxious.     Immunization History  Administered Date(s) Administered   Fluad Quad(high Dose 65+) 03/22/2019   Influenza, High Dose Seasonal PF 03/09/2022   Influenza,inj,Quad PF,6+ Mos 03/27/2013, 02/08/2017, 02/09/2018   Influenza-Unspecified 02/21/2014   Moderna Covid-19 Vaccine Bivalent Booster 77yrs & up 02/13/2021   Moderna SARS-COV2 Booster Vaccination 08/20/2020   Moderna Sars-Covid-2 Vaccination 06/05/2019, 07/03/2019, 04/02/2022   Pfizer Covid-19 Vaccine Bivalent Booster 44yrs & up 02/13/2021   Pneumococcal Polysaccharide-23 03/20/2012   Tdap 08/01/2022   Pertinent  Health Maintenance Due  Topic Date Due   INFLUENZA VACCINE  Completed   DEXA SCAN  Completed      02/15/2020    9:00 AM 02/15/2020    9:49 AM 04/03/2020    9:55 AM 04/04/2020   10:58 AM 02/26/2021    7:29 AM  Fall Risk  Falls in the past year?   0     Was there an injury with Fall?   0    Fall Risk Category Calculator   0    Fall Risk Category (Retired)   Low    (RETIRED) Patient Fall Risk Level High fall risk High fall risk High fall risk High fall risk Moderate fall risk   Functional Status Survey:    Vitals:   08/02/22 1417  BP: 128/63  Pulse: 74  Resp: 18  Temp: 97.7 F (36.5 C)  SpO2: 92%  Weight: 126 lb (57.2 kg)  Height: 5\' 3"  (1.6 m)   Body mass index is 22.32 kg/m. Physical Exam Constitutional:      Appearance: Normal appearance.  HENT:     Head: Normocephalic and atraumatic.     Nose: Nose normal.     Mouth/Throat:     Mouth: Mucous membranes are moist.  Eyes:     Conjunctiva/sclera: Conjunctivae normal.  Cardiovascular:     Rate and Rhythm: Normal rate and regular rhythm.  Pulmonary:     Effort: Pulmonary effort is normal.     Breath sounds: Normal breath sounds.  Abdominal:     General: Bowel sounds are normal.     Palpations: Abdomen is soft.  Musculoskeletal:     Cervical back: Normal range of motion.  Skin:    General: Skin is warm and dry.     Comments: Right eyeborow laceration  with 6 sutures.  Neurological:     Mental Status: She is alert. Mental status is at baseline.  Psychiatric:        Mood and Affect: Mood normal.        Behavior: Behavior normal.    Labs reviewed: Recent Labs    07/08/22 0000 08/01/22 0120  NA 139 138  K 4.1 3.5  CL 106 105  CO2 26* 23  GLUCOSE  --  122*  BUN 18 19  CREATININE 0.8 0.86  CALCIUM 9.4 9.4   Recent Labs    07/08/22 0000 08/01/22 0120  AST 14 19  ALT 4* 10  ALKPHOS 63 71  BILITOT  --  0.7  PROT  --  7.6  ALBUMIN 4.0 4.1   Recent Labs    07/08/22 0000 08/01/22 0120  WBC 6.1 7.2  NEUTROABS 3,459.00 4.6  HGB 11.6* 12.0  HCT 35* 37.3  MCV  --  98.4  PLT 266 239   Lab Results  Component Value Date   TSH 2.721 10/11/2017  No results found for: "HGBA1C" Lab Results  Component Value Date   CHOL 271 (H) 09/17/2013   HDL  49.10 09/17/2013   LDLCALC 200 (H) 09/17/2013   TRIG 110.0 09/17/2013   CHOLHDL 6 09/17/2013    Significant Diagnostic Results in last 30 days:  DG Chest 2 View  Result Date: 08/01/2022 CLINICAL DATA:  Mechanical fall. EXAM: CHEST - 2 VIEW COMPARISON:  02/12/2020 FINDINGS: Stable cardiomediastinal silhouette. Aortic atherosclerotic calcification. Chronic bronchitic change and bibasilar scarring/atelectasis. No focal consolidation, pleural effusion, or pneumothorax. Remote left rib fractures. No evidence of acute rib fracture. Demineralization. IMPRESSION: No acute cardiopulmonary process. Electronically Signed   By: Placido Sou M.D.   On: 08/01/2022 03:14   CT HEAD WO CONTRAST (5MM)  Result Date: 08/01/2022 CLINICAL DATA:  Head trauma, minor (Age >= 65y); Facial trauma, blunt; Neck trauma (Age >= 65y) EXAM: CT HEAD WITHOUT CONTRAST CT MAXILLOFACIAL WITHOUT CONTRAST CT CERVICAL SPINE WITHOUT CONTRAST TECHNIQUE: Multidetector CT imaging of the head, cervical spine, and maxillofacial structures were performed using the standard protocol without intravenous contrast. Multiplanar CT image reconstructions of the cervical spine and maxillofacial structures were also generated. RADIATION DOSE REDUCTION: This exam was performed according to the departmental dose-optimization program which includes automated exposure control, adjustment of the mA and/or kV according to patient size and/or use of iterative reconstruction technique. COMPARISON:  None Available. FINDINGS: CT HEAD FINDINGS Brain: Patchy and confluent areas of decreased attenuation are noted throughout the deep and periventricular white matter of the cerebral hemispheres bilaterally, compatible with chronic microvascular ischemic disease. No evidence of large-territorial acute infarction. No parenchymal hemorrhage. No mass lesion. No extra-axial collection. No mass effect or midline shift. No hydrocephalus. Basilar cisterns are patent. Vascular:  No hyperdense vessel. Atherosclerotic calcifications are present within the cavernous internal carotid arteries. Skull: No acute fracture or focal lesion. Other: None. CT MAXILLOFACIAL FINDINGS Osseous: No fracture or mandibular dislocation. No destructive process. Sinuses/Orbits: Paranasal sinuses and mastoid air cells are clear. Left lens replacement. Otherwise the orbits are unremarkable. Soft tissues: Right periorbital soft tissue defect. No retained radiopaque foreign body. CT CERVICAL SPINE FINDINGS Alignment: Mild retrolisthesis of C3 on C4. Grade 1 anterolisthesis of C4 on C5. Grade 1 anterolisthesis of C7 on T1. Skull base and vertebrae: Multilevel severe degenerative changes of the spine with associated multilevel moderate severe osseous neural foraminal stenosis. No severe osseous central canal stenosis. No acute fracture. No aggressive appearing focal osseous lesion or focal pathologic process. Soft tissues and spinal canal: No prevertebral fluid or swelling. No visible canal hematoma. Upper chest: Unremarkable. Other: None. IMPRESSION: 1. No acute intracranial abnormality. 2. No acute displaced facial fracture. 3. No acute displaced fracture or traumatic listhesis of the cervical spine. Electronically Signed   By: Iven Finn M.D.   On: 08/01/2022 03:10   CT Cervical Spine Wo Contrast  Result Date: 08/01/2022 CLINICAL DATA:  Head trauma, minor (Age >= 65y); Facial trauma, blunt; Neck trauma (Age >= 65y) EXAM: CT HEAD WITHOUT CONTRAST CT MAXILLOFACIAL WITHOUT CONTRAST CT CERVICAL SPINE WITHOUT CONTRAST TECHNIQUE: Multidetector CT imaging of the head, cervical spine, and maxillofacial structures were performed using the standard protocol without intravenous contrast. Multiplanar CT image reconstructions of the cervical spine and maxillofacial structures were also generated. RADIATION DOSE REDUCTION: This exam was performed according to the departmental dose-optimization program which includes  automated exposure control, adjustment of the mA and/or kV according to patient size and/or use of iterative reconstruction technique. COMPARISON:  None Available. FINDINGS: CT HEAD  FINDINGS Brain: Patchy and confluent areas of decreased attenuation are noted throughout the deep and periventricular white matter of the cerebral hemispheres bilaterally, compatible with chronic microvascular ischemic disease. No evidence of large-territorial acute infarction. No parenchymal hemorrhage. No mass lesion. No extra-axial collection. No mass effect or midline shift. No hydrocephalus. Basilar cisterns are patent. Vascular: No hyperdense vessel. Atherosclerotic calcifications are present within the cavernous internal carotid arteries. Skull: No acute fracture or focal lesion. Other: None. CT MAXILLOFACIAL FINDINGS Osseous: No fracture or mandibular dislocation. No destructive process. Sinuses/Orbits: Paranasal sinuses and mastoid air cells are clear. Left lens replacement. Otherwise the orbits are unremarkable. Soft tissues: Right periorbital soft tissue defect. No retained radiopaque foreign body. CT CERVICAL SPINE FINDINGS Alignment: Mild retrolisthesis of C3 on C4. Grade 1 anterolisthesis of C4 on C5. Grade 1 anterolisthesis of C7 on T1. Skull base and vertebrae: Multilevel severe degenerative changes of the spine with associated multilevel moderate severe osseous neural foraminal stenosis. No severe osseous central canal stenosis. No acute fracture. No aggressive appearing focal osseous lesion or focal pathologic process. Soft tissues and spinal canal: No prevertebral fluid or swelling. No visible canal hematoma. Upper chest: Unremarkable. Other: None. IMPRESSION: 1. No acute intracranial abnormality. 2. No acute displaced facial fracture. 3. No acute displaced fracture or traumatic listhesis of the cervical spine. Electronically Signed   By: Iven Finn M.D.   On: 08/01/2022 03:10   CT Maxillofacial Wo  Contrast  Result Date: 08/01/2022 CLINICAL DATA:  Head trauma, minor (Age >= 65y); Facial trauma, blunt; Neck trauma (Age >= 65y) EXAM: CT HEAD WITHOUT CONTRAST CT MAXILLOFACIAL WITHOUT CONTRAST CT CERVICAL SPINE WITHOUT CONTRAST TECHNIQUE: Multidetector CT imaging of the head, cervical spine, and maxillofacial structures were performed using the standard protocol without intravenous contrast. Multiplanar CT image reconstructions of the cervical spine and maxillofacial structures were also generated. RADIATION DOSE REDUCTION: This exam was performed according to the departmental dose-optimization program which includes automated exposure control, adjustment of the mA and/or kV according to patient size and/or use of iterative reconstruction technique. COMPARISON:  None Available. FINDINGS: CT HEAD FINDINGS Brain: Patchy and confluent areas of decreased attenuation are noted throughout the deep and periventricular white matter of the cerebral hemispheres bilaterally, compatible with chronic microvascular ischemic disease. No evidence of large-territorial acute infarction. No parenchymal hemorrhage. No mass lesion. No extra-axial collection. No mass effect or midline shift. No hydrocephalus. Basilar cisterns are patent. Vascular: No hyperdense vessel. Atherosclerotic calcifications are present within the cavernous internal carotid arteries. Skull: No acute fracture or focal lesion. Other: None. CT MAXILLOFACIAL FINDINGS Osseous: No fracture or mandibular dislocation. No destructive process. Sinuses/Orbits: Paranasal sinuses and mastoid air cells are clear. Left lens replacement. Otherwise the orbits are unremarkable. Soft tissues: Right periorbital soft tissue defect. No retained radiopaque foreign body. CT CERVICAL SPINE FINDINGS Alignment: Mild retrolisthesis of C3 on C4. Grade 1 anterolisthesis of C4 on C5. Grade 1 anterolisthesis of C7 on T1. Skull base and vertebrae: Multilevel severe degenerative changes of  the spine with associated multilevel moderate severe osseous neural foraminal stenosis. No severe osseous central canal stenosis. No acute fracture. No aggressive appearing focal osseous lesion or focal pathologic process. Soft tissues and spinal canal: No prevertebral fluid or swelling. No visible canal hematoma. Upper chest: Unremarkable. Other: None. IMPRESSION: 1. No acute intracranial abnormality. 2. No acute displaced facial fracture. 3. No acute displaced fracture or traumatic listhesis of the cervical spine. Electronically Signed   By: Iven Finn M.D.   On: 08/01/2022  03:10    Assessment/Plan  1. Laceration of skin of eyebrow, right, sequela -  treatment to right eyebrow wound daily -   monitor for redness and drainage -  fall precautions .   Family/ staff Communication:  Discussed plan of care with resident and charge nurse.  Labs/tests ordered:  None

## 2022-08-09 ENCOUNTER — Encounter: Payer: Self-pay | Admitting: Student

## 2022-08-09 ENCOUNTER — Non-Acute Institutional Stay (SKILLED_NURSING_FACILITY): Payer: Medicare Other | Admitting: Student

## 2022-08-09 DIAGNOSIS — I10 Essential (primary) hypertension: Secondary | ICD-10-CM | POA: Diagnosis not present

## 2022-08-09 DIAGNOSIS — Z9181 History of falling: Secondary | ICD-10-CM | POA: Diagnosis not present

## 2022-08-09 DIAGNOSIS — K82A1 Gangrene of gallbladder in cholecystitis: Secondary | ICD-10-CM | POA: Diagnosis not present

## 2022-08-09 DIAGNOSIS — Z66 Do not resuscitate: Secondary | ICD-10-CM

## 2022-08-09 NOTE — Progress Notes (Signed)
Location:  Other [place].  Nursing Home Room Number: [place] of Service:  SNF [phone]) Provider: Dr. Dewayne Shorter  Code Status: DNR Goals of Care:     08/09/2022   11:31 AM  Advanced Directives  Does Patient Have a Medical Advance Directive? Yes  Type of Paramedic of [place];Out of facility DNR (pink MOST or yellow form)  Does patient want to make changes to medical advance directive? No - Patient declined  Copy of Painter in Chart? Yes - validated most recent copy scanned in chart (See row information)     Chief Complaint  Patient presents with  . Hospitalization Follow-up    ER Follow up    HPI: Patient is a 89 y.o. female seen today for ER Follow up  She hit her head. She had 6 sutures placed that have since been removed. She is improving.  Past Medical History:  Diagnosis Date  . Acute gangrenous cholecystitis 02/12/2020  . Arthritis   . Atypical nevi 09/2014   lentiginous dysplastic nevus with mod atypia ([place])  . Basal cell carcinoma 08/12/2017   R distal dorsum nose   . Cholecystitis with gangrene of gallbladder   . Diverticulitis   . DNR (do not resuscitate)   . Esophageal stricture 2012  . GERD (gastroesophageal reflux disease)   . HLD (hyperlipidemia)    did not tolerate statin, stopped checking  . HTN (hypertension)   . Hypokalemia   . Hypomagnesemia   . Hyponatremia   . Leg wound, left, subsequent encounter 11/12/2017  . Malignant melanoma ([place]) 2015, 2017   R chin s/p excision ([place]) R upper back [name])  . Malignant melanoma ([place]) 07/27/19 [facility]LE [facility]   R lat deltoid  . Multiple rib fractures 11/02/2017  . Osteoporosis, post-menopausal    bisphosphonate caused dysphagia, requests defer DEXA for now  . SCC (squamous cell carcinoma) 03/16/2022   R mid cheek, txt with 73f/u/calcipotriene  . SCC (squamous cell carcinoma) 04/26/2022   L lower calf,ED[facility]  . SCC (squamous cell  carcinoma) 04/26/2022   R ant ankle, [facility]  . Seasonal allergies   . Sepsis ([place]) 02/12/2020  . Skin lesion 08/17/2018  . Squamous cell carcinoma of skin 08/21/2018   L med leg below knee  . Squamous cell carcinoma of skin 01/20/2017   R pretibial lateral  . Squamous cell carcinoma of skin 09/25/2020   Left med lower leg above ankle - [facility]  . Squamous cell carcinoma of skin 09/09/2021   L ankle, [facility]  . Wears hearing aid     Past Surgical History:  Procedure Laterality Date  . BUNIONECTOMY Right 2001  . CARPAL TUNNEL RELEASE Bilateral 1974  . CATARACT EXTRACTION Bilateral    [place]  . IR CHOLANGIOGRAM EXISTING TUBE  04/04/2020  . IR CHOLANGIOGRAM EXISTING TUBE  01/19/2021  . IR EXCHANGE BILIARY DRAIN  02/26/2021  . IR EXCHANGE BILIARY DRAIN  09/01/2021  . IR EXCHANGE BILIARY DRAIN  12/04/2021  . IR EXCHANGE BILIARY DRAIN  02/24/2022  . IR EXCHANGE BILIARY DRAIN  05/26/2022  . IR RADIOLOGIST EVAL & MGMT  01/22/2021  . IR REMOVAL OF CALCULI/DEBRIS BILIARY DUCT/GB  02/26/2021  . TONSILLECTOMY  1928    Allergies  Allergen Reactions  . Other Rash and Other (See Comments)    Silk Suture - redness and severe pain  . Bisphosphonates Other (See Comments)    Dysphagia to oral bisphosphonates  . Codeine Nausea And  Vomiting  . Statins Other (See Comments)    Muscle aches  . Sulfa Antibiotics     Can't recall reaction  . Procaine Hcl Palpitations    Outpatient Encounter Medications as of 08/09/2022  Medication Sig  . acetaminophen (TYLENOL) 500 MG tablet Take 500 mg by mouth every 4 (four) hours as needed for mild pain.  Marland Kitchen ammonium lactate (AMLACTIN) 12 % cream Apply 1 Application topically as needed for dry skin.  Marland Kitchen bisacodyl (DULCOLAX) 10 MG suppository Place 10 mg rectally daily as needed for moderate constipation.  . carbamide peroxide (DEBROX) 6.5 % OTIC solution Place 5 drops into both ears as needed.  . Emollient (GOLD BOND ULT ROUGH/BUMPY SKIN) CREA Apply topically 2 (two) times  daily. Apply to trunk and extremities topically every day and evening  . magnesium hydroxide (MILK OF MAGNESIA) 400 MG/5ML suspension Take 30 mLs by mouth daily as needed for mild constipation.  . Menthol, Topical Analgesic, (EUCERIN ITCH RELIEF) 0.1 % LOTN Apply 1 Application topically every 8 (eight) hours as needed (For itching).  . nepafenac (ILEVRO) 0.3 % ophthalmic suspension Place 1 drop into the left eye daily.   . polyethylene glycol (MIRALAX / GLYCOLAX) 17 g packet Take 17 g by mouth daily as needed for moderate constipation.  . Pramoxine-Menthol (GOLD BOND RAPID RELIEF) 1-1 % CREA Apply to feet, toes and other affected areas as needed for itch.  . prednisoLONE acetate (PRED MILD) 0.12 % ophthalmic suspension Place 1 drop into the left eye daily.   Marland Kitchen Propylene Glycol (SYSTANE COMPLETE OP) Place 1 drop into the left eye every 8 (eight) hours as needed (dry eyes).  . Vitamin D, Ergocalciferol, (DRISDOL) 1.25 MG (50000 UNIT) CAPS capsule Take 50,000 Units by mouth every 30 (thirty) days. Give on the 4th of every month.   No facility-administered encounter medications on file as of 08/09/2022.    Review of Systems:  Review of Systems  Health Maintenance  Topic Date Due  . Zoster Vaccines- Shingrix (1 of 2) Never done  . Pneumonia Vaccine 102+ Years old (2 of 2 - PCV) 03/20/2013  . COVID-19 Vaccine (5 - 2023-24 season) 05/28/2022  . DTaP/Tdap/Td (2 - Td or Tdap) 07/31/2032  . INFLUENZA VACCINE  Completed  . DEXA SCAN  Completed  . HPV VACCINES  Aged Out    Physical Exam: Vitals:   08/09/22 1126  BP: 132/73  Pulse: 72  Resp: 18  Temp: (!) 97 F (36.1 C)  SpO2: 95%  Weight: 126 lb (57.2 kg)  Height: 5\' 3"  (1.6 m)   Body mass index is 22.32 kg/m. Physical Exam  Labs reviewed: Basic Metabolic Panel: Recent Labs    07/08/22 0000 08/01/22 0120  NA 139 138  K 4.1 3.5  CL 106 105  CO2 26* 23  GLUCOSE  --  122*  BUN 18 19  CREATININE 0.8 0.86  CALCIUM 9.4 9.4    Liver Function Tests: Recent Labs    07/08/22 0000 08/01/22 0120  AST 14 19  ALT 4* 10  ALKPHOS 63 71  BILITOT  --  0.7  PROT  --  7.6  ALBUMIN 4.0 4.1   No results for input(s): "LIPASE", "AMYLASE" in the last 8760 hours. No results for input(s): "AMMONIA" in the last 8760 hours. CBC: Recent Labs    07/08/22 0000 08/01/22 0120  WBC 6.1 7.2  NEUTROABS 3,459.00 4.6  HGB 11.6* 12.0  HCT 35* 37.3  MCV  --  98.4  PLT 266  239   Lipid Panel: No results for input(s): "CHOL", "HDL", "LDLCALC", "TRIG", "CHOLHDL", "LDLDIRECT" in the last 8760 hours. No results found for: "HGBA1C"  Procedures since last visit: DG Chest 2 View  Result Date: 08/01/2022 CLINICAL DATA:  Mechanical fall. EXAM: CHEST - 2 VIEW COMPARISON:  02/12/2020 FINDINGS: Stable cardiomediastinal silhouette. Aortic atherosclerotic calcification. Chronic bronchitic change and bibasilar scarring/atelectasis. No focal consolidation, pleural effusion, or pneumothorax. Remote left rib fractures. No evidence of acute rib fracture. Demineralization. IMPRESSION: No acute cardiopulmonary process. Electronically Signed   By: Placido Sou M.D.   On: 08/01/2022 03:14   CT HEAD WO CONTRAST (5MM)  Result Date: 08/01/2022 CLINICAL DATA:  Head trauma, minor (Age >= 65y); Facial trauma, blunt; Neck trauma (Age >= 65y) EXAM: CT HEAD WITHOUT CONTRAST CT MAXILLOFACIAL WITHOUT CONTRAST CT CERVICAL SPINE WITHOUT CONTRAST TECHNIQUE: Multidetector CT imaging of the head, cervical spine, and maxillofacial structures were performed using the standard protocol without intravenous contrast. Multiplanar CT image reconstructions of the cervical spine and maxillofacial structures were also generated. RADIATION DOSE REDUCTION: This exam was performed according to the departmental dose-optimization program which includes automated exposure control, adjustment of the mA and/or kV according to patient size and/or use of iterative reconstruction  technique. COMPARISON:  None Available. FINDINGS: CT HEAD FINDINGS Brain: Patchy and confluent areas of decreased attenuation are noted throughout the deep and periventricular white matter of the cerebral hemispheres bilaterally, compatible with chronic microvascular ischemic disease. No evidence of large-territorial acute infarction. No parenchymal hemorrhage. No mass lesion. No extra-axial collection. No mass effect or midline shift. No hydrocephalus. Basilar cisterns are patent. Vascular: No hyperdense vessel. Atherosclerotic calcifications are present within the cavernous internal carotid arteries. Skull: No acute fracture or focal lesion. Other: None. CT MAXILLOFACIAL FINDINGS Osseous: No fracture or mandibular dislocation. No destructive process. Sinuses/Orbits: Paranasal sinuses and mastoid air cells are clear. Left lens replacement. Otherwise the orbits are unremarkable. Soft tissues: Right periorbital soft tissue defect. No retained radiopaque foreign body. CT CERVICAL SPINE FINDINGS Alignment: Mild retrolisthesis of C3 on C4. Grade 1 anterolisthesis of C4 on C5. Grade 1 anterolisthesis of C7 on T1. Skull base and vertebrae: Multilevel severe degenerative changes of the spine with associated multilevel moderate severe osseous neural foraminal stenosis. No severe osseous central canal stenosis. No acute fracture. No aggressive appearing focal osseous lesion or focal pathologic process. Soft tissues and spinal canal: No prevertebral fluid or swelling. No visible canal hematoma. Upper chest: Unremarkable. Other: None. IMPRESSION: 1. No acute intracranial abnormality. 2. No acute displaced facial fracture. 3. No acute displaced fracture or traumatic listhesis of the cervical spine. Electronically Signed   By: Iven Finn M.D.   On: 08/01/2022 03:10   CT Cervical Spine Wo Contrast  Result Date: 08/01/2022 CLINICAL DATA:  Head trauma, minor (Age >= 65y); Facial trauma, blunt; Neck trauma (Age >= 65y)  EXAM: CT HEAD WITHOUT CONTRAST CT MAXILLOFACIAL WITHOUT CONTRAST CT CERVICAL SPINE WITHOUT CONTRAST TECHNIQUE: Multidetector CT imaging of the head, cervical spine, and maxillofacial structures were performed using the standard protocol without intravenous contrast. Multiplanar CT image reconstructions of the cervical spine and maxillofacial structures were also generated. RADIATION DOSE REDUCTION: This exam was performed according to the departmental dose-optimization program which includes automated exposure control, adjustment of the mA and/or kV according to patient size and/or use of iterative reconstruction technique. COMPARISON:  None Available. FINDINGS: CT HEAD FINDINGS Brain: Patchy and confluent areas of decreased attenuation are noted throughout the deep and periventricular white matter of the  cerebral hemispheres bilaterally, compatible with chronic microvascular ischemic disease. No evidence of large-territorial acute infarction. No parenchymal hemorrhage. No mass lesion. No extra-axial collection. No mass effect or midline shift. No hydrocephalus. Basilar cisterns are patent. Vascular: No hyperdense vessel. Atherosclerotic calcifications are present within the cavernous internal carotid arteries. Skull: No acute fracture or focal lesion. Other: None. CT MAXILLOFACIAL FINDINGS Osseous: No fracture or mandibular dislocation. No destructive process. Sinuses/Orbits: Paranasal sinuses and mastoid air cells are clear. Left lens replacement. Otherwise the orbits are unremarkable. Soft tissues: Right periorbital soft tissue defect. No retained radiopaque foreign body. CT CERVICAL SPINE FINDINGS Alignment: Mild retrolisthesis of C3 on C4. Grade 1 anterolisthesis of C4 on C5. Grade 1 anterolisthesis of C7 on T1. Skull base and vertebrae: Multilevel severe degenerative changes of the spine with associated multilevel moderate severe osseous neural foraminal stenosis. No severe osseous central canal stenosis. No  acute fracture. No aggressive appearing focal osseous lesion or focal pathologic process. Soft tissues and spinal canal: No prevertebral fluid or swelling. No visible canal hematoma. Upper chest: Unremarkable. Other: None. IMPRESSION: 1. No acute intracranial abnormality. 2. No acute displaced facial fracture. 3. No acute displaced fracture or traumatic listhesis of the cervical spine. Electronically Signed   By: Iven Finn M.D.   On: 08/01/2022 03:10   CT Maxillofacial Wo Contrast  Result Date: 08/01/2022 CLINICAL DATA:  Head trauma, minor (Age >= 65y); Facial trauma, blunt; Neck trauma (Age >= 65y) EXAM: CT HEAD WITHOUT CONTRAST CT MAXILLOFACIAL WITHOUT CONTRAST CT CERVICAL SPINE WITHOUT CONTRAST TECHNIQUE: Multidetector CT imaging of the head, cervical spine, and maxillofacial structures were performed using the standard protocol without intravenous contrast. Multiplanar CT image reconstructions of the cervical spine and maxillofacial structures were also generated. RADIATION DOSE REDUCTION: This exam was performed according to the departmental dose-optimization program which includes automated exposure control, adjustment of the mA and/or kV according to patient size and/or use of iterative reconstruction technique. COMPARISON:  None Available. FINDINGS: CT HEAD FINDINGS Brain: Patchy and confluent areas of decreased attenuation are noted throughout the deep and periventricular white matter of the cerebral hemispheres bilaterally, compatible with chronic microvascular ischemic disease. No evidence of large-territorial acute infarction. No parenchymal hemorrhage. No mass lesion. No extra-axial collection. No mass effect or midline shift. No hydrocephalus. Basilar cisterns are patent. Vascular: No hyperdense vessel. Atherosclerotic calcifications are present within the cavernous internal carotid arteries. Skull: No acute fracture or focal lesion. Other: None. CT MAXILLOFACIAL FINDINGS Osseous: No fracture  or mandibular dislocation. No destructive process. Sinuses/Orbits: Paranasal sinuses and mastoid air cells are clear. Left lens replacement. Otherwise the orbits are unremarkable. Soft tissues: Right periorbital soft tissue defect. No retained radiopaque foreign body. CT CERVICAL SPINE FINDINGS Alignment: Mild retrolisthesis of C3 on C4. Grade 1 anterolisthesis of C4 on C5. Grade 1 anterolisthesis of C7 on T1. Skull base and vertebrae: Multilevel severe degenerative changes of the spine with associated multilevel moderate severe osseous neural foraminal stenosis. No severe osseous central canal stenosis. No acute fracture. No aggressive appearing focal osseous lesion or focal pathologic process. Soft tissues and spinal canal: No prevertebral fluid or swelling. No visible canal hematoma. Upper chest: Unremarkable. Other: None. IMPRESSION: 1. No acute intracranial abnormality. 2. No acute displaced facial fracture. 3. No acute displaced fracture or traumatic listhesis of the cervical spine. Electronically Signed   By: Iven Finn M.D.   On: 08/01/2022 03:10    Assessment/Plan There are no diagnoses linked to this encounter.   Labs/tests ordered:  * No  order type specified * Next appt:  Visit date not found

## 2022-08-11 ENCOUNTER — Telehealth: Payer: Self-pay | Admitting: Student

## 2022-08-11 NOTE — Telephone Encounter (Signed)
Spoke with Jerlyn Ly regarding continued leakage of fluid. She verified patient does not want emergent/invasive intervention for ostomy, however, may need to move up appointment that is currently scheduled for 3/27. Messaged medical assistant in Radiology clinic and she stated she could get her rescheduled sooner -- likely this week.

## 2022-08-12 ENCOUNTER — Ambulatory Visit
Admission: RE | Admit: 2022-08-12 | Discharge: 2022-08-12 | Disposition: A | Discharge status: Home / Self Care | Payer: Medicare Other | Source: Ambulatory Visit | Attending: Interventional Radiology | Admitting: Interventional Radiology

## 2022-08-12 ENCOUNTER — Other Ambulatory Visit: Payer: Self-pay | Admitting: Interventional Radiology

## 2022-08-12 DIAGNOSIS — K819 Cholecystitis, unspecified: Secondary | ICD-10-CM

## 2022-08-12 DIAGNOSIS — Z434 Encounter for attention to other artificial openings of digestive tract: Secondary | ICD-10-CM | POA: Diagnosis present

## 2022-08-12 HISTORY — PX: IR EXCHANGE BILIARY DRAIN: IMG6046

## 2022-08-12 MED ORDER — LIDOCAINE HCL (PF) 2 % IJ SOLN
INTRAMUSCULAR | Status: AC
  Filled 2022-08-12: qty 10

## 2022-08-12 MED ORDER — IOHEXOL 300 MG/ML  SOLN: 8.0000 mL | Freq: Once | INTRAMUSCULAR | Status: DC | PRN

## 2022-08-12 MED ORDER — LIDOCAINE HCL 1 % IJ SOLN
INTRAMUSCULAR | Status: AC
  Filled 2022-08-12: qty 20

## 2022-08-12 MED ORDER — LIDOCAINE HCL (PF) 1 % IJ SOLN
INTRAMUSCULAR | Status: AC
  Administered 2022-08-12: 6 mL
  Filled 2022-08-12: qty 30

## 2022-08-16 ENCOUNTER — Encounter: Payer: Self-pay | Admitting: Student

## 2022-08-16 ENCOUNTER — Other Ambulatory Visit: Payer: Self-pay | Admitting: Interventional Radiology

## 2022-08-16 ENCOUNTER — Non-Acute Institutional Stay (SKILLED_NURSING_FACILITY): Payer: Medicare Other | Admitting: Student

## 2022-08-16 DIAGNOSIS — K82A1 Gangrene of gallbladder in cholecystitis: Secondary | ICD-10-CM | POA: Diagnosis not present

## 2022-08-16 DIAGNOSIS — Z939 Artificial opening status, unspecified: Secondary | ICD-10-CM

## 2022-08-16 DIAGNOSIS — K810 Acute cholecystitis: Secondary | ICD-10-CM

## 2022-08-16 NOTE — Progress Notes (Signed)
Location:  [facility]  [facility] Room Number: [place] of Service:  SNF [phone]) Provider:  Dewayne Shorter, MD  Patient Care Team: Dewayne Shorter, MD as PCP - General (Family Medicine) Rolm Bookbinder, MD as Consulting Physician (Dermatology) [name], MD as Consulting Physician (Orthopedic Surgery) [name], MD as Referring Physician (Ophthalmology) Kipp Laurence, MD as Referring Physician (Audiology) Wandra Mannan, DMD as Referring Physician (Dentistry)  Extended Emergency Contact Information Primary Emergency Contact: Cloyd Stagers of [place] Phone: [phone] Relation: Daughter Secondary Emergency Contact: Marcello Moores Mobile Phone: [phone] Relation: Daughter  Code Status:  DNR Goals of care: Advanced Directive information    08/16/2022   11:19 AM  Advanced Directives  Does Patient Have a Medical Advance Directive? Yes  Type of Paramedic of [place];Out of facility DNR (pink MOST or yellow form)  Does patient want to make changes to medical advance directive? No - Patient declined  Copy of [place] in Chart? Yes - validated most recent copy scanned in chart (See row information)     Chief Complaint  Patient presents with   Acute Visit    Tube Check.     HPI:  Pt is a 89 y.o. female seen today for an acute visit for evaluation of her tube. Patient had ostomy tube replaced on 3/21, however, today it appears to be dislodged with increased surrounding drainage.   Patient states, "You know I'm over 89 years old, and there's only so much that I can take."   Nursing states the bandage was soiled and notable suture seen -- looks like it's from th einside.    Past Medical History:  Diagnosis Date   Acute gangrenous cholecystitis 02/12/2020   Arthritis    Atypical nevi 09/2014   lentiginous dysplastic nevus with mod atypia ([place])   Basal cell  carcinoma 08/12/2017   R distal dorsum nose    Cholecystitis with gangrene of gallbladder    Diverticulitis    DNR (do not resuscitate)    Esophageal stricture 2012   GERD (gastroesophageal reflux disease)    HLD (hyperlipidemia)    did not tolerate statin, stopped checking   HTN (hypertension)    Hypokalemia    Hypomagnesemia    Hyponatremia    Leg wound, left, subsequent encounter 11/12/2017   Malignant melanoma ([place]) 2015, 2017   R chin s/p excision ([place]) R upper back [name])   Malignant melanoma ([place]) 07/27/19 WLE at [facility]   R lat deltoid   Multiple rib fractures 11/02/2017   Osteoporosis, post-menopausal    bisphosphonate caused dysphagia, requests defer DEXA for now   SCC (squamous cell carcinoma) 03/16/2022   R mid cheek, txt with 89f/u/calcipotriene   SCC (squamous cell carcinoma) 04/26/2022   L lower calf,EDC   SCC (squamous cell carcinoma) 04/26/2022   R ant ankle, EDC   Seasonal allergies    Sepsis ([place]) 02/12/2020   Skin lesion 08/17/2018   Squamous cell carcinoma of skin 08/21/2018   L med leg below knee   Squamous cell carcinoma of skin 01/20/2017   R pretibial lateral   Squamous cell carcinoma of skin 09/25/2020   Left med lower leg above ankle - EDC   Squamous cell carcinoma of skin 09/09/2021   L ankle, EDC   Wears hearing aid    Past Surgical History:  Procedure Laterality Date   BUNIONECTOMY Right 2001   CARPAL TUNNEL RELEASE Bilateral 1974  CATARACT EXTRACTION Bilateral    [place]   IR CHOLANGIOGRAM EXISTING TUBE  04/04/2020   IR CHOLANGIOGRAM EXISTING TUBE  01/19/2021   IR EXCHANGE BILIARY DRAIN  02/26/2021   IR EXCHANGE BILIARY DRAIN  09/01/2021   IR EXCHANGE BILIARY DRAIN  12/04/2021   IR EXCHANGE BILIARY DRAIN  02/24/2022   IR EXCHANGE BILIARY DRAIN  05/26/2022   IR EXCHANGE BILIARY DRAIN  08/12/2022   IR RADIOLOGIST EVAL & MGMT  01/22/2021   IR REMOVAL OF CALCULI/DEBRIS BILIARY DUCT/GB  02/26/2021   TONSILLECTOMY  1928    Allergies   Allergen Reactions   Other Rash and Other (See Comments)    Silk Suture - redness and severe pain   Bisphosphonates Other (See Comments)    Dysphagia to oral bisphosphonates   Codeine Nausea And Vomiting   Statins Other (See Comments)    Muscle aches   Sulfa Antibiotics     Can't recall reaction   Procaine Hcl Palpitations    Outpatient Encounter Medications as of 08/16/2022  Medication Sig   acetaminophen (TYLENOL) 500 MG tablet Take 500 mg by mouth every 4 (four) hours as needed for mild pain.   ammonium lactate (AMLACTIN) 12 % cream Apply 1 Application topically as needed for dry skin.   bisacodyl (DULCOLAX) 10 MG suppository Place 10 mg rectally daily as needed for moderate constipation.   carbamide peroxide (DEBROX) 6.5 % OTIC solution Place 5 drops into both ears as needed.   Emollient (GOLD BOND ULT ROUGH/BUMPY SKIN) CREA Apply topically 2 (two) times daily. Apply to trunk and extremities topically every day and evening   magnesium hydroxide (MILK OF MAGNESIA) 400 MG/5ML suspension Take 30 mLs by mouth daily as needed for mild constipation.   Menthol, Topical Analgesic, (EUCERIN ITCH RELIEF) 0.1 % LOTN Apply 1 Application topically every 8 (eight) hours as needed (For itching).   nepafenac (ILEVRO) 0.3 % ophthalmic suspension Place 1 drop into the left eye daily.    polyethylene glycol (MIRALAX / GLYCOLAX) 17 g packet Take 17 g by mouth daily as needed for moderate constipation.   Pramoxine-Menthol (GOLD BOND RAPID RELIEF) 1-1 % CREA Apply to feet, toes and other affected areas as needed for itch.   prednisoLONE acetate (PRED MILD) 0.12 % ophthalmic suspension Place 1 drop into the left eye daily.    Propylene Glycol (SYSTANE COMPLETE OP) Place 1 drop into the left eye every 8 (eight) hours as needed (dry eyes).   Vitamin D, Ergocalciferol, (DRISDOL) 1.25 MG (50000 UNIT) CAPS capsule Take 50,000 Units by mouth every 30 (thirty) days. Give on the 4th of every month.   No  facility-administered encounter medications on file as of 08/16/2022.    Review of Systems  Immunization History  Administered Date(s) Administered   Fluad Quad(high Dose 65+) 03/22/2019   Influenza, High Dose Seasonal PF 03/09/2022   Influenza,inj,Quad PF,6+ Mos 03/27/2013, 02/08/2017, 02/09/2018   Influenza-Unspecified 02/21/2014   Moderna Covid-19 Vaccine Bivalent Booster 30yrs & up 02/13/2021   Moderna SARS-COV2 Booster Vaccination 08/20/2020   Moderna Sars-Covid-2 Vaccination 06/05/2019, 07/03/2019, 04/02/2022   Pfizer Covid-19 Vaccine Bivalent Booster 83yrs & up 02/13/2021   Pneumococcal Polysaccharide-23 03/20/2012   Tdap 08/01/2022   Pertinent  Health Maintenance Due  Topic Date Due   INFLUENZA VACCINE  Completed   DEXA SCAN  Completed      02/15/2020    9:00 AM 02/15/2020    9:49 AM 04/03/2020    9:55 AM 04/04/2020   10:58 AM 02/26/2021  7:29 AM  Fall Risk  Falls in the past year?   0    Was there an injury with Fall?   0    Fall Risk Category Calculator   0    Fall Risk Category (Retired)   Low    (RETIRED) Patient Fall Risk Level High fall risk High fall risk High fall risk High fall risk Moderate fall risk   Functional Status Survey:    Vitals:   08/16/22 1112  BP: 124/74  Pulse: 70  Resp: 18  Temp: (!) 97.5 F (36.4 C)  SpO2: 96%  Weight: 126 lb (57.2 kg)  Height: 5\' 3"  (1.6 m)   Body mass index is 22.32 kg/m. Physical Exam Vitals reviewed.  Cardiovascular:     Rate and Rhythm: Normal rate and regular rhythm.  Pulmonary:     Effort: Pulmonary effort is normal.     Breath sounds: Normal breath sounds.  Abdominal:     General: Bowel sounds are normal.     Palpations: Abdomen is soft.     Comments: Ostomy site with visible granulation tissue at the base, with green liquid coming from around the ostomy tube.   Neurological:     Mental Status: She is alert.     Labs reviewed: Recent Labs    07/08/22 0000 08/01/22 0120  NA 139 138  K 4.1  3.5  CL 106 105  CO2 26* 23  GLUCOSE  --  122*  BUN 18 19  CREATININE 0.8 0.86  CALCIUM 9.4 9.4   Recent Labs    07/08/22 0000 08/01/22 0120  AST 14 19  ALT 4* 10  ALKPHOS 63 71  BILITOT  --  0.7  PROT  --  7.6  ALBUMIN 4.0 4.1   Recent Labs    07/08/22 0000 08/01/22 0120  WBC 6.1 7.2  NEUTROABS 3,459.00 4.6  HGB 11.6* 12.0  HCT 35* 37.3  MCV  --  98.4  PLT 266 239   Lab Results  Component Value Date   TSH 2.721 10/11/2017   No results found for: "HGBA1C" Lab Results  Component Value Date   CHOL 271 (H) 09/17/2013   HDL 49.10 09/17/2013   LDLCALC 200 (H) 09/17/2013   TRIG 110.0 09/17/2013   CHOLHDL 6 09/17/2013    Significant Diagnostic Results in last 30 days:  IR EXCHANGE BILIARY DRAIN  Result Date: 08/12/2022 INDICATION: Malfunctioning cholecystostomy tube, pain in leakage EXAM: Exchange of percutaneous cholecystostomy tube using fluoroscopic guidance MEDICATIONS: None ANESTHESIA/SEDATION: Local analgesia FLUOROSCOPY: Radiation Exposure Index (as provided by the fluoroscopic device): 2.5 minutes (13 mGy) COMPLICATIONS: None immediate. PROCEDURE: Informed written consent was obtained from the patient after a thorough discussion of the procedural risks, benefits and alternatives. All questions were addressed. Maximal Sterile Barrier Technique was utilized including caps, mask, sterile gowns, sterile gloves, sterile drape, hand hygiene and skin antiseptic. A timeout was performed prior to the initiation of the procedure. Patient was placed supine on the exam table. The right upper quadrant was prepped and draped in the standard sterile fashion with inclusion of the existing cholecystostomy tube within the sterile field. Initial injection of the existing cholecystostomy tube demonstrated that it was retracted and partially in the gallbladder. While maintaining the locking loop, access into the gallbladder was obtained using a stiff angled Glidewire. Location was  confirmed with looping of the wire in the gallbladder lumen. The locking loop was released, and over this wire the existing cholecystostomy tube was exchanged for a new,  similar 14 French locking cholecystostomy tube. Locking loop was formed. Location of the gallbladder was confirmed with injection of contrast material. The drainage catheter was secured to the skin using a dressing. It was attached to bag drainage. Patient tolerated the procedure well without immediate complication. IMPRESSION: Successful exchange and repositioning of the patient's percutaneous cholecystostomy tube for a new, similar 14 French locking drainage catheter. Drainage catheter placed to bag drainage. The patient may return to Interventional Radiology in 4-6 months for routine exchange. Electronically Signed   By: Albin Felling M.D.   On: 08/12/2022 10:33   DG Chest 2 View  Result Date: 08/01/2022 CLINICAL DATA:  Mechanical fall. EXAM: CHEST - 2 VIEW COMPARISON:  02/12/2020 FINDINGS: Stable cardiomediastinal silhouette. Aortic atherosclerotic calcification. Chronic bronchitic change and bibasilar scarring/atelectasis. No focal consolidation, pleural effusion, or pneumothorax. Remote left rib fractures. No evidence of acute rib fracture. Demineralization. IMPRESSION: No acute cardiopulmonary process. Electronically Signed   By: Placido Sou M.D.   On: 08/01/2022 03:14   CT HEAD WO CONTRAST (5MM)  Result Date: 08/01/2022 CLINICAL DATA:  Head trauma, minor (Age >= 65y); Facial trauma, blunt; Neck trauma (Age >= 65y) EXAM: CT HEAD WITHOUT CONTRAST CT MAXILLOFACIAL WITHOUT CONTRAST CT CERVICAL SPINE WITHOUT CONTRAST TECHNIQUE: Multidetector CT imaging of the head, cervical spine, and maxillofacial structures were performed using the standard protocol without intravenous contrast. Multiplanar CT image reconstructions of the cervical spine and maxillofacial structures were also generated. RADIATION DOSE REDUCTION: This exam was  performed according to the departmental dose-optimization program which includes automated exposure control, adjustment of the mA and/or kV according to patient size and/or use of iterative reconstruction technique. COMPARISON:  None Available. FINDINGS: CT HEAD FINDINGS Brain: Patchy and confluent areas of decreased attenuation are noted throughout the deep and periventricular white matter of the cerebral hemispheres bilaterally, compatible with chronic microvascular ischemic disease. No evidence of large-territorial acute infarction. No parenchymal hemorrhage. No mass lesion. No extra-axial collection. No mass effect or midline shift. No hydrocephalus. Basilar cisterns are patent. Vascular: No hyperdense vessel. Atherosclerotic calcifications are present within the cavernous internal carotid arteries. Skull: No acute fracture or focal lesion. Other: None. CT MAXILLOFACIAL FINDINGS Osseous: No fracture or mandibular dislocation. No destructive process. Sinuses/Orbits: Paranasal sinuses and mastoid air cells are clear. Left lens replacement. Otherwise the orbits are unremarkable. Soft tissues: Right periorbital soft tissue defect. No retained radiopaque foreign body. CT CERVICAL SPINE FINDINGS Alignment: Mild retrolisthesis of C3 on C4. Grade 1 anterolisthesis of C4 on C5. Grade 1 anterolisthesis of C7 on T1. Skull base and vertebrae: Multilevel severe degenerative changes of the spine with associated multilevel moderate severe osseous neural foraminal stenosis. No severe osseous central canal stenosis. No acute fracture. No aggressive appearing focal osseous lesion or focal pathologic process. Soft tissues and spinal canal: No prevertebral fluid or swelling. No visible canal hematoma. Upper chest: Unremarkable. Other: None. IMPRESSION: 1. No acute intracranial abnormality. 2. No acute displaced facial fracture. 3. No acute displaced fracture or traumatic listhesis of the cervical spine. Electronically Signed   By:  Iven Finn M.D.   On: 08/01/2022 03:10   CT Cervical Spine Wo Contrast  Result Date: 08/01/2022 CLINICAL DATA:  Head trauma, minor (Age >= 65y); Facial trauma, blunt; Neck trauma (Age >= 65y) EXAM: CT HEAD WITHOUT CONTRAST CT MAXILLOFACIAL WITHOUT CONTRAST CT CERVICAL SPINE WITHOUT CONTRAST TECHNIQUE: Multidetector CT imaging of the head, cervical spine, and maxillofacial structures were performed using the standard protocol without intravenous contrast. Multiplanar CT image reconstructions  of the cervical spine and maxillofacial structures were also generated. RADIATION DOSE REDUCTION: This exam was performed according to the departmental dose-optimization program which includes automated exposure control, adjustment of the mA and/or kV according to patient size and/or use of iterative reconstruction technique. COMPARISON:  None Available. FINDINGS: CT HEAD FINDINGS Brain: Patchy and confluent areas of decreased attenuation are noted throughout the deep and periventricular white matter of the cerebral hemispheres bilaterally, compatible with chronic microvascular ischemic disease. No evidence of large-territorial acute infarction. No parenchymal hemorrhage. No mass lesion. No extra-axial collection. No mass effect or midline shift. No hydrocephalus. Basilar cisterns are patent. Vascular: No hyperdense vessel. Atherosclerotic calcifications are present within the cavernous internal carotid arteries. Skull: No acute fracture or focal lesion. Other: None. CT MAXILLOFACIAL FINDINGS Osseous: No fracture or mandibular dislocation. No destructive process. Sinuses/Orbits: Paranasal sinuses and mastoid air cells are clear. Left lens replacement. Otherwise the orbits are unremarkable. Soft tissues: Right periorbital soft tissue defect. No retained radiopaque foreign body. CT CERVICAL SPINE FINDINGS Alignment: Mild retrolisthesis of C3 on C4. Grade 1 anterolisthesis of C4 on C5. Grade 1 anterolisthesis of C7 on T1.  Skull base and vertebrae: Multilevel severe degenerative changes of the spine with associated multilevel moderate severe osseous neural foraminal stenosis. No severe osseous central canal stenosis. No acute fracture. No aggressive appearing focal osseous lesion or focal pathologic process. Soft tissues and spinal canal: No prevertebral fluid or swelling. No visible canal hematoma. Upper chest: Unremarkable. Other: None. IMPRESSION: 1. No acute intracranial abnormality. 2. No acute displaced facial fracture. 3. No acute displaced fracture or traumatic listhesis of the cervical spine. Electronically Signed   By: Iven Finn M.D.   On: 08/01/2022 03:10   CT Maxillofacial Wo Contrast  Result Date: 08/01/2022 CLINICAL DATA:  Head trauma, minor (Age >= 65y); Facial trauma, blunt; Neck trauma (Age >= 65y) EXAM: CT HEAD WITHOUT CONTRAST CT MAXILLOFACIAL WITHOUT CONTRAST CT CERVICAL SPINE WITHOUT CONTRAST TECHNIQUE: Multidetector CT imaging of the head, cervical spine, and maxillofacial structures were performed using the standard protocol without intravenous contrast. Multiplanar CT image reconstructions of the cervical spine and maxillofacial structures were also generated. RADIATION DOSE REDUCTION: This exam was performed according to the departmental dose-optimization program which includes automated exposure control, adjustment of the mA and/or kV according to patient size and/or use of iterative reconstruction technique. COMPARISON:  None Available. FINDINGS: CT HEAD FINDINGS Brain: Patchy and confluent areas of decreased attenuation are noted throughout the deep and periventricular white matter of the cerebral hemispheres bilaterally, compatible with chronic microvascular ischemic disease. No evidence of large-territorial acute infarction. No parenchymal hemorrhage. No mass lesion. No extra-axial collection. No mass effect or midline shift. No hydrocephalus. Basilar cisterns are patent. Vascular: No hyperdense  vessel. Atherosclerotic calcifications are present within the cavernous internal carotid arteries. Skull: No acute fracture or focal lesion. Other: None. CT MAXILLOFACIAL FINDINGS Osseous: No fracture or mandibular dislocation. No destructive process. Sinuses/Orbits: Paranasal sinuses and mastoid air cells are clear. Left lens replacement. Otherwise the orbits are unremarkable. Soft tissues: Right periorbital soft tissue defect. No retained radiopaque foreign body. CT CERVICAL SPINE FINDINGS Alignment: Mild retrolisthesis of C3 on C4. Grade 1 anterolisthesis of C4 on C5. Grade 1 anterolisthesis of C7 on T1. Skull base and vertebrae: Multilevel severe degenerative changes of the spine with associated multilevel moderate severe osseous neural foraminal stenosis. No severe osseous central canal stenosis. No acute fracture. No aggressive appearing focal osseous lesion or focal pathologic process. Soft tissues and spinal canal:  No prevertebral fluid or swelling. No visible canal hematoma. Upper chest: Unremarkable. Other: None. IMPRESSION: 1. No acute intracranial abnormality. 2. No acute displaced facial fracture. 3. No acute displaced fracture or traumatic listhesis of the cervical spine. Electronically Signed   By: Iven Finn M.D.   On: 08/01/2022 03:10    Assessment/Plan Gangrene of gallbladder in cholecystitis  History of creation of ostomy [facility]) Patient appears stable on exam. Denies symptoms other than drainage. Plan to have ostomy replaced on 3/27. Discussed concern that patient will need more security of the ostomy tube to prevent additional future replacements. Concern that each repeating the procedure will continue to cause trauma to the insertion site. Gave patient the option to defer interventions, however, she states this is not aligned with her goals at this time.  Family/ staff Communication: nurse, communication with IR physician and scheduling nurse.   Labs/tests ordered:  none

## 2022-08-17 ENCOUNTER — Ambulatory Visit
Admission: RE | Admit: 2022-08-17 | Discharge: 2022-08-17 | Disposition: A | Discharge status: Home / Self Care | Payer: Medicare Other | Source: Ambulatory Visit | Attending: Interventional Radiology | Admitting: Interventional Radiology

## 2022-08-17 ENCOUNTER — Other Ambulatory Visit: Payer: Self-pay | Admitting: Interventional Radiology

## 2022-08-17 DIAGNOSIS — K810 Acute cholecystitis: Secondary | ICD-10-CM

## 2022-08-17 DIAGNOSIS — Z434 Encounter for attention to other artificial openings of digestive tract: Secondary | ICD-10-CM | POA: Diagnosis not present

## 2022-08-17 HISTORY — PX: IR EXCHANGE BILIARY DRAIN: IMG6046

## 2022-08-17 MED ORDER — LIDOCAINE HCL 1 % IJ SOLN
INTRAMUSCULAR | Status: AC
  Filled 2022-08-17: qty 20

## 2022-08-17 MED ORDER — IOHEXOL 300 MG/ML  SOLN
50.0000 mL | Freq: Once | INTRAMUSCULAR | Status: AC | PRN
  Administered 2022-08-17: 25 mL

## 2022-08-18 ENCOUNTER — Ambulatory Visit: Payer: Medicare Other | Admitting: Radiology

## 2022-08-19 ENCOUNTER — Non-Acute Institutional Stay (SKILLED_NURSING_FACILITY): Payer: Medicare Other | Admitting: Nurse Practitioner

## 2022-08-19 ENCOUNTER — Encounter: Payer: Self-pay | Admitting: Nurse Practitioner

## 2022-08-19 DIAGNOSIS — K819 Cholecystitis, unspecified: Secondary | ICD-10-CM

## 2022-08-19 DIAGNOSIS — R112 Nausea with vomiting, unspecified: Secondary | ICD-10-CM

## 2022-08-19 LAB — COMPREHENSIVE METABOLIC PANEL
Albumin: 4.3 (ref 3.5–5.0)
Calcium: 9.6 (ref 8.7–10.7)
Globulin: 3.3
eGFR: 48

## 2022-08-19 LAB — HEPATIC FUNCTION PANEL
ALT: 5 U/L — AB (ref 7–35)
AST: 14 (ref 13–35)
Alkaline Phosphatase: 69 (ref 25–125)
Bilirubin, Total: 0.9

## 2022-08-19 LAB — BASIC METABOLIC PANEL
BUN: 31 — AB (ref 4–21)
CO2: 27 — AB (ref 13–22)
Chloride: 95 — AB (ref 99–108)
Creatinine: 1.1 (ref 0.5–1.1)
Glucose: 138
Potassium: 3.8 mEq/L (ref 3.5–5.1)
Sodium: 134 — AB (ref 137–147)

## 2022-08-19 LAB — CBC AND DIFFERENTIAL
HCT: 36 (ref 36–46)
Hemoglobin: 12.1 (ref 12.0–16.0)
Neutrophils Absolute: 14826
Platelets: 303 10*3/uL (ref 150–400)
WBC: 17.1

## 2022-08-19 LAB — CBC: RBC: 3.85 — AB (ref 3.87–5.11)

## 2022-08-19 NOTE — Progress Notes (Signed)
Location:  Other [facility]) Nursing Home Room Number: 207-A Place of Service:  SNF [phone])  Teresa Meyer. Teresa Oats, NP    Patient Care Team: Teresa Shorter, MD as PCP - General (Family Medicine) Teresa Bookbinder, MD as Consulting Physician (Dermatology) Teresa Guan Laurice Record, MD as Consulting Physician (Orthopedic Surgery) Teresa Cotta, MD as Referring Physician (Ophthalmology) Teresa Laurence, MD as Referring Physician (Audiology) Teresa Meyer, DMD as Referring Physician (Dentistry)  Extended Emergency Contact Information Primary Emergency Contact: Teresa Meyer of [place] Phone: [phone] Relation: Daughter Secondary Emergency Contact: Teresa Meyer Mobile Phone: [phone] Relation: Daughter  Goals of care: Advanced Directive information    08/19/2022    2:20 PM  Advanced Directives  Does Patient Have a Medical Advance Directive? Yes  Type of Paramedic of [place];Out of facility DNR (pink MOST or yellow form)  Does patient want to make changes to medical advance directive? No - Patient declined  Copy of [place] in Chart? Yes - validated most recent copy scanned in chart (See row information)  Pre-existing out of facility DNR order (yellow form or pink MOST form) Yellow form placed in chart (order not valid for inpatient use)     Chief Complaint  Patient presents with   Acute Visit     Nausea and vomiting.  Increase drainage from chole drain    HPI:  Pt is a 89 y.o. female seen today for an acute visit for n/v and increased drainage from her chole drain. On 3/26 drain became dislodged and she went to the hospital and had tube replaced.  Nursing concerned due to drainage significantly increased, used to be max of 200 in 24 hours and now 1000-1200 and having to empty frequently. no significant color change- slightly greener. no fevers or chills but she is having more nausea and vomiting.  No  fevers or chills. This afternoon she is up on her computer. Reports some abdominal tenderness but no ongoing N/V.   Past Medical History:  Diagnosis Date   Acute gangrenous cholecystitis 02/12/2020   Arthritis    Atypical nevi 09/2014   lentiginous dysplastic nevus with mod atypia ([place])   Basal cell carcinoma 08/12/2017   R distal dorsum nose    Cholecystitis with gangrene of gallbladder    Diverticulitis    DNR (do not resuscitate)    Esophageal stricture 2012   GERD (gastroesophageal reflux disease)    HLD (hyperlipidemia)    did not tolerate statin, stopped checking   HTN (hypertension)    Hypokalemia    Hypomagnesemia    Hyponatremia    Leg wound, left, subsequent encounter 11/12/2017   Malignant melanoma ([place]) 2015, 2017   R chin s/p excision ([place]) R upper back [name])   Malignant melanoma ([place]) 07/27/19 WLE at [facility]   R lat deltoid   Multiple rib fractures 11/02/2017   Osteoporosis, post-menopausal    bisphosphonate caused dysphagia, requests defer DEXA for now   SCC (squamous cell carcinoma) 03/16/2022   R mid cheek, txt with 69f/[id number]/calcipotriene   SCC (squamous cell carcinoma) 04/26/2022   L lower calf,EDC   SCC (squamous cell carcinoma) 04/26/2022   R ant ankle, EDC   Seasonal allergies    Sepsis ([place]) 02/12/2020   Skin lesion 08/17/2018   Squamous cell carcinoma of skin 08/21/2018   L med leg below knee   Squamous cell carcinoma of skin 01/20/2017   R pretibial lateral   Squamous cell carcinoma of skin 09/25/2020  Left med lower leg above ankle - EDC   Squamous cell carcinoma of skin 09/09/2021   L ankle, EDC   Wears hearing aid    Past Surgical History:  Procedure Laterality Date   BUNIONECTOMY Right 2001   CARPAL TUNNEL RELEASE Bilateral 1974   CATARACT EXTRACTION Bilateral    [place]   IR CHOLANGIOGRAM EXISTING TUBE  04/04/2020   IR CHOLANGIOGRAM EXISTING TUBE  01/19/2021   IR EXCHANGE BILIARY DRAIN  02/26/2021   IR EXCHANGE BILIARY DRAIN   09/01/2021   IR EXCHANGE BILIARY DRAIN  12/04/2021   IR EXCHANGE BILIARY DRAIN  02/24/2022   IR EXCHANGE BILIARY DRAIN  05/26/2022   IR EXCHANGE BILIARY DRAIN  08/12/2022   IR EXCHANGE BILIARY DRAIN  08/17/2022   IR RADIOLOGIST EVAL & MGMT  01/22/2021   IR REMOVAL OF CALCULI/DEBRIS BILIARY DUCT/GB  02/26/2021   TONSILLECTOMY  1928    Allergies  Allergen Reactions   Other Rash and Other (See Comments)    Silk Suture - redness and severe pain   Bisphosphonates Other (See Comments)    Dysphagia to oral bisphosphonates   Codeine Nausea And Vomiting   Statins Other (See Comments)    Muscle aches   Sulfa Antibiotics     Can't recall reaction   Procaine Hcl Palpitations    Outpatient Encounter Medications as of 08/19/2022  Medication Sig   acetaminophen (TYLENOL) 500 MG tablet Take 500 mg by mouth every 4 (four) hours as needed for mild pain.   ammonium lactate (AMLACTIN) 12 % cream Apply 1 Application topically as needed for dry skin.   bisacodyl (DULCOLAX) 10 MG suppository Place 10 mg rectally daily as needed for moderate constipation.   carbamide peroxide (DEBROX) 6.5 % OTIC solution Place 5 drops into both ears as needed.   Emollient (GOLD BOND ULT ROUGH/BUMPY SKIN) CREA Apply 1 Application topically as needed. Trunk and extremities, see other listing   magnesium hydroxide (MILK OF MAGNESIA) 400 MG/5ML suspension Take 30 mLs by mouth daily as needed for mild constipation.   Menthol, Topical Analgesic, (EUCERIN ITCH RELIEF) 0.1 % LOTN Apply 1 Application topically every 8 (eight) hours as needed (For itching).   nepafenac (ILEVRO) 0.3 % ophthalmic suspension Place 1 drop into the left eye daily.    ondansetron (ZOFRAN) 4 MG tablet Take 4 mg by mouth every 8 (eight) hours as needed for nausea or vomiting.   polyethylene glycol (MIRALAX / GLYCOLAX) 17 g packet Take 17 g by mouth daily as needed for moderate constipation.   Pramoxine-Menthol (GOLD BOND RAPID RELIEF) 1-1 % CREA Apply to feet, toes  and other affected areas as needed for itch.   prednisoLONE acetate (PRED MILD) 0.12 % ophthalmic suspension Place 1 drop into the left eye daily.    Propylene Glycol (SYSTANE COMPLETE OP) Place 1 drop into the left eye every 8 (eight) hours as needed (dry eyes).   Vitamin D, Ergocalciferol, (DRISDOL) 1.25 MG (50000 UNIT) CAPS capsule Take 50,000 Units by mouth every 30 (thirty) days. Give on the 4th of every month.   No facility-administered encounter medications on file as of 08/19/2022.    Review of Systems  Constitutional:  Negative for activity change, appetite change, fatigue and unexpected weight change.  HENT:  Negative for congestion and hearing loss.   Eyes: Negative.   Respiratory:  Negative for cough and shortness of breath.   Cardiovascular:  Negative for chest pain, palpitations and leg swelling.  Gastrointestinal:  Positive for nausea and  vomiting. Negative for abdominal pain, constipation and diarrhea.  Genitourinary:  Negative for difficulty urinating and dysuria.  Musculoskeletal:  Negative for arthralgias and myalgias.  Skin:  Negative for color change and wound.  Neurological:  Negative for dizziness and weakness.  Psychiatric/Behavioral:  Negative for agitation, behavioral problems and confusion.     Immunization History  Administered Date(s) Administered   Fluad Quad(high Dose 65+) 03/22/2019   Influenza, High Dose Seasonal PF 03/09/2022   Influenza,inj,Quad PF,6+ Mos 03/27/2013, 02/08/2017, 02/09/2018   Influenza-Unspecified 02/21/2014   Moderna Covid-19 Vaccine Bivalent Booster 62yrs & up 02/13/2021   Moderna SARS-COV2 Booster Vaccination 08/20/2020   Moderna Sars-Covid-2 Vaccination 06/05/2019, 07/03/2019, 04/02/2022   Pfizer Covid-19 Vaccine Bivalent Booster 37yrs & up 02/13/2021   Pneumococcal Polysaccharide-23 03/20/2012   Tdap 08/01/2022   Pertinent  Health Maintenance Due  Topic Date Due   INFLUENZA VACCINE  Completed   DEXA SCAN  Completed       02/15/2020    9:00 AM 02/15/2020    9:49 AM 04/03/2020    9:55 AM 04/04/2020   10:58 AM 02/26/2021    7:29 AM  Fall Risk  Falls in the past year?   0    Was there an injury with Fall?   0    Fall Risk Category Calculator   0    Fall Risk Category (Retired)   Low    (RETIRED) Patient Fall Risk Level High fall risk High fall risk High fall risk High fall risk Moderate fall risk   Functional Status Survey:    Vitals:   08/19/22 1417  BP: (!) 142/76  Pulse: 80  Resp: 18  Temp: (!) 97.1 F (36.2 C)  SpO2: 92%  Weight: 126 lb (57.2 kg)  Height: 5\' 3"  (1.6 m)   Body mass index is 22.32 kg/m. Physical Exam Constitutional:      General: She is not in acute distress.    Appearance: She is well-developed. She is not diaphoretic.  HENT:     Head: Normocephalic and atraumatic.     Mouth/Throat:     Pharynx: No oropharyngeal exudate.  Eyes:     Conjunctiva/sclera: Conjunctivae normal.     Pupils: Pupils are equal, round, and reactive to light.  Cardiovascular:     Rate and Rhythm: Normal rate and regular rhythm.     Heart sounds: Normal heart sounds.  Pulmonary:     Effort: Pulmonary effort is normal.     Breath sounds: Normal breath sounds.  Abdominal:     General: Bowel sounds are normal.     Palpations: Abdomen is soft.  Musculoskeletal:     Cervical back: Normal range of motion and neck supple.     Right lower leg: No edema.     Left lower leg: No edema.  Skin:    General: Skin is warm and dry.  Neurological:     Mental Status: She is alert. Mental status is at baseline.     Comments: Short term memory loss   Psychiatric:        Mood and Affect: Mood normal.     Labs reviewed: Recent Labs    07/08/22 0000 08/01/22 0120  NA 139 138  K 4.1 3.5  CL 106 105  CO2 26* 23  GLUCOSE  --  122*  BUN 18 19  CREATININE 0.8 0.86  CALCIUM 9.4 9.4   Recent Labs    07/08/22 0000 08/01/22 0120  AST 14 19  ALT 4* 10  ALKPHOS  63 71  BILITOT  --  0.7  PROT  --  7.6   ALBUMIN 4.0 4.1   Recent Labs    07/08/22 0000 08/01/22 0120  WBC 6.1 7.2  NEUTROABS 3,459.00 4.6  HGB 11.6* 12.0  HCT 35* 37.3  MCV  --  98.4  PLT 266 239   Lab Results  Component Value Date   TSH 2.721 10/11/2017   No results found for: "HGBA1C" Lab Results  Component Value Date   CHOL 271 (H) 09/17/2013   HDL 49.10 09/17/2013   LDLCALC 200 (H) 09/17/2013   TRIG 110.0 09/17/2013   CHOLHDL 6 09/17/2013    Significant Diagnostic Results in last 30 days:  IR EXCHANGE BILIARY DRAIN  Result Date: 08/17/2022 INDICATION: Previous history: 89 year old female with history of cholelithiasis and choledocholithiasis with chronic indwelling cholecystostomy drain status post choledochoscope-assisted percutaneous gallstone retrieval on 02/26/2021 which was largely successful. The patient has persistent indwelling cholecystostomy tube as she has previously stated she does not want any further procedures done and does not mind having the tube in place. Current history: Over the past several months, the patient has now re-presented to the [facility] hospital with complaints of dislodged or leaking cholecystostomy tube. The patient has reported significant discomfort with cholecystostomy tube exchanges. She now presents today with a dislodged cholecystostomy tube. I had a lengthy discussion with patient's daughter about further management. It would appear that she had a previously successful procedure with Dr. Serafina Royals to percutaneously remove her gallstones. However, due to a combination of what appear to be a persistent gallstone in the common bile duct and her advanced age, a capping trial was not attempted and therefore she had a chronic cholecystostomy tube. I discussed with the daughter that her cystic duct has appear patent on multiple previous examinations, and that if the common bile duct stone could be removed, she may be able to be to tube free given her the extremely negative impact the  cholecystostomy tube has had on her the past year. The patient's daughter was agreeable for me to proceed with cholecystostomy tube replacement and possible stone removal, followed by possible future capping trial. EXAM: 1. Exchange of percutaneous cholecystostomy tube for a percutaneous biliary drainage catheter using fluoroscopic guidance 2. Percutaneous choledocholith extraction using fluoroscopic guidance MEDICATIONS: None ANESTHESIA/SEDATION: Local analgesia FLUOROSCOPY: Radiation Exposure Index (as provided by the fluoroscopic device): 7.0 minutes (50 mGy) COMPLICATIONS: None immediate. PROCEDURE: Informed written consent was obtained from the patient after a thorough discussion of the procedural risks, benefits and alternatives. All questions were addressed. Maximal Sterile Barrier Technique was utilized including caps, mask, sterile gowns, sterile gloves, sterile drape, hand hygiene and skin antiseptic. A timeout was performed prior to the initiation of the procedure. Patient was placed supine on the exam table. The right upper quadrant was prepped and draped in the standard sterile fashion with the inclusion of the old percutaneous access site within the sterile field. An 035 catheter was advanced through the percutaneous defect, and gentle hand injection of contrast material was performed. This demonstrated immediate opacification of the gallbladder lumen and cystic duct. Given previous discussion with the patient's daughter, the decision was made to proceed with attempted catheterization of the common bile duct through this access and stone retrieval. Using combination of the 035 angled catheter and Bentson guidewire, wire and catheter were advanced into the common bile duct. Contrast injection of the common bile duct was performed, confirming location. A small circular filling defect was identified, compatible with the  known choledocholith. Wire and catheter were then advanced into the proximal small  bowel through the ampulla. Location was again confirmed with injection of contrast material. Wire was then exchanged for an Amplatz wire, followed by a short 6 French sheath advanced into the cystic duct. Then, a 5 [place] Fogarty balloon was advanced into the proximal common bile duct, inflated to its nominal size, and then advanced through the ampulla pushing the small gallstone out. Repeat injection of the common bile duct through the sheath demonstrated no further filling defects identified. However, no contrast material was seen coursing through the ampulla, which may have been secondary to trauma caused by pushing the gallstone and Fogarty balloon through the ampulla. Therefore, the decision was made to leave a biliary drainage catheter in place through the ampulla to ensure adequate drainage until healing occurs. Over the Amplatz wire, a 25 [place] biliary drainage catheter was advanced, such as the terminal loop was located in the proximal small bowel. Injection of contrast material confirmed appropriate drainage of the common bile duct. The drainage catheter was then secured to the skin using a dressing, and attached to bag drainage. The patient tolerated the procedure well without immediate complication. IMPRESSION: 1. Successful percutaneous stone extraction of the known choledocholith in the common bile duct. 2. Successful exchange/replacement of the patient's cholecystostomy tube for a new percutaneous biliary drainage catheter via existing access. Biliary drainage catheter was left across the ampulla to allow for healing secondary to presumed trauma following stone extraction. 3. Patient to return to Interventional Radiology in 1 week for repeat sheath cholangiogram and possible safety catheter placement/capping trial. Assuming the ampulla is patent, the patient may return the following week for lab check and safety catheter/tube removal. Electronically Signed   By: Albin Felling M.D.   On: 08/17/2022  16:17   IR EXCHANGE BILIARY DRAIN  Result Date: 08/12/2022 INDICATION: Malfunctioning cholecystostomy tube, pain in leakage EXAM: Exchange of percutaneous cholecystostomy tube using fluoroscopic guidance MEDICATIONS: None ANESTHESIA/SEDATION: Local analgesia FLUOROSCOPY: Radiation Exposure Index (as provided by the fluoroscopic device): 2.5 minutes (13 mGy) COMPLICATIONS: None immediate. PROCEDURE: Informed written consent was obtained from the patient after a thorough discussion of the procedural risks, benefits and alternatives. All questions were addressed. Maximal Sterile Barrier Technique was utilized including caps, mask, sterile gowns, sterile gloves, sterile drape, hand hygiene and skin antiseptic. A timeout was performed prior to the initiation of the procedure. Patient was placed supine on the exam table. The right upper quadrant was prepped and draped in the standard sterile fashion with inclusion of the existing cholecystostomy tube within the sterile field. Initial injection of the existing cholecystostomy tube demonstrated that it was retracted and partially in the gallbladder. While maintaining the locking loop, access into the gallbladder was obtained using a stiff angled Glidewire. Location was confirmed with looping of the wire in the gallbladder lumen. The locking loop was released, and over this wire the existing cholecystostomy tube was exchanged for a new, similar 14 French locking cholecystostomy tube. Locking loop was formed. Location of the gallbladder was confirmed with injection of contrast material. The drainage catheter was secured to the skin using a dressing. It was attached to bag drainage. Patient tolerated the procedure well without immediate complication. IMPRESSION: Successful exchange and repositioning of the patient's percutaneous cholecystostomy tube for a new, similar 14 French locking drainage catheter. Drainage catheter placed to bag drainage. The patient may return to  Interventional Radiology in 4-6 months for routine exchange. Electronically Signed   By:  Albin Felling M.D.   On: 08/12/2022 10:33   DG Chest 2 View  Result Date: 08/01/2022 CLINICAL DATA:  Mechanical fall. EXAM: CHEST - 2 VIEW COMPARISON:  02/12/2020 FINDINGS: Stable cardiomediastinal silhouette. Aortic atherosclerotic calcification. Chronic bronchitic change and bibasilar scarring/atelectasis. No focal consolidation, pleural effusion, or pneumothorax. Remote left rib fractures. No evidence of acute rib fracture. Demineralization. IMPRESSION: No acute cardiopulmonary process. Electronically Signed   By: Placido Sou M.D.   On: 08/01/2022 03:14   CT HEAD WO CONTRAST (5MM)  Result Date: 08/01/2022 CLINICAL DATA:  Head trauma, minor (Age >= 65y); Facial trauma, blunt; Neck trauma (Age >= 65y) EXAM: CT HEAD WITHOUT CONTRAST CT MAXILLOFACIAL WITHOUT CONTRAST CT CERVICAL SPINE WITHOUT CONTRAST TECHNIQUE: Multidetector CT imaging of the head, cervical spine, and maxillofacial structures were performed using the standard protocol without intravenous contrast. Multiplanar CT image reconstructions of the cervical spine and maxillofacial structures were also generated. RADIATION DOSE REDUCTION: This exam was performed according to the departmental dose-optimization program which includes automated exposure control, adjustment of the mA and/or kV according to patient size and/or use of iterative reconstruction technique. COMPARISON:  None Available. FINDINGS: CT HEAD FINDINGS Brain: Patchy and confluent areas of decreased attenuation are noted throughout the deep and periventricular white matter of the cerebral hemispheres bilaterally, compatible with chronic microvascular ischemic disease. No evidence of large-territorial acute infarction. No parenchymal hemorrhage. No mass lesion. No extra-axial collection. No mass effect or midline shift. No hydrocephalus. Basilar cisterns are patent. Vascular: No hyperdense  vessel. Atherosclerotic calcifications are present within the cavernous internal carotid arteries. Skull: No acute fracture or focal lesion. Other: None. CT MAXILLOFACIAL FINDINGS Osseous: No fracture or mandibular dislocation. No destructive process. Sinuses/Orbits: Paranasal sinuses and mastoid air cells are clear. Left lens replacement. Otherwise the orbits are unremarkable. Soft tissues: Right periorbital soft tissue defect. No retained radiopaque foreign body. CT CERVICAL SPINE FINDINGS Alignment: Mild retrolisthesis of C3 on C4. Grade 1 anterolisthesis of C4 on C5. Grade 1 anterolisthesis of C7 on T1. Skull base and vertebrae: Multilevel severe degenerative changes of the spine with associated multilevel moderate severe osseous neural foraminal stenosis. No severe osseous central canal stenosis. No acute fracture. No aggressive appearing focal osseous lesion or focal pathologic process. Soft tissues and spinal canal: No prevertebral fluid or swelling. No visible canal hematoma. Upper chest: Unremarkable. Other: None. IMPRESSION: 1. No acute intracranial abnormality. 2. No acute displaced facial fracture. 3. No acute displaced fracture or traumatic listhesis of the cervical spine. Electronically Signed   By: Iven Finn M.D.   On: 08/01/2022 03:10   CT Cervical Spine Wo Contrast  Result Date: 08/01/2022 CLINICAL DATA:  Head trauma, minor (Age >= 65y); Facial trauma, blunt; Neck trauma (Age >= 65y) EXAM: CT HEAD WITHOUT CONTRAST CT MAXILLOFACIAL WITHOUT CONTRAST CT CERVICAL SPINE WITHOUT CONTRAST TECHNIQUE: Multidetector CT imaging of the head, cervical spine, and maxillofacial structures were performed using the standard protocol without intravenous contrast. Multiplanar CT image reconstructions of the cervical spine and maxillofacial structures were also generated. RADIATION DOSE REDUCTION: This exam was performed according to the departmental dose-optimization program which includes automated exposure  control, adjustment of the mA and/or kV according to patient size and/or use of iterative reconstruction technique. COMPARISON:  None Available. FINDINGS: CT HEAD FINDINGS Brain: Patchy and confluent areas of decreased attenuation are noted throughout the deep and periventricular white matter of the cerebral hemispheres bilaterally, compatible with chronic microvascular ischemic disease. No evidence of large-territorial acute infarction. No parenchymal hemorrhage. No  mass lesion. No extra-axial collection. No mass effect or midline shift. No hydrocephalus. Basilar cisterns are patent. Vascular: No hyperdense vessel. Atherosclerotic calcifications are present within the cavernous internal carotid arteries. Skull: No acute fracture or focal lesion. Other: None. CT MAXILLOFACIAL FINDINGS Osseous: No fracture or mandibular dislocation. No destructive process. Sinuses/Orbits: Paranasal sinuses and mastoid air cells are clear. Left lens replacement. Otherwise the orbits are unremarkable. Soft tissues: Right periorbital soft tissue defect. No retained radiopaque foreign body. CT CERVICAL SPINE FINDINGS Alignment: Mild retrolisthesis of C3 on C4. Grade 1 anterolisthesis of C4 on C5. Grade 1 anterolisthesis of C7 on T1. Skull base and vertebrae: Multilevel severe degenerative changes of the spine with associated multilevel moderate severe osseous neural foraminal stenosis. No severe osseous central canal stenosis. No acute fracture. No aggressive appearing focal osseous lesion or focal pathologic process. Soft tissues and spinal canal: No prevertebral fluid or swelling. No visible canal hematoma. Upper chest: Unremarkable. Other: None. IMPRESSION: 1. No acute intracranial abnormality. 2. No acute displaced facial fracture. 3. No acute displaced fracture or traumatic listhesis of the cervical spine. Electronically Signed   By: Iven Finn M.D.   On: 08/01/2022 03:10   CT Maxillofacial Wo Contrast  Result Date:  08/01/2022 CLINICAL DATA:  Head trauma, minor (Age >= 65y); Facial trauma, blunt; Neck trauma (Age >= 65y) EXAM: CT HEAD WITHOUT CONTRAST CT MAXILLOFACIAL WITHOUT CONTRAST CT CERVICAL SPINE WITHOUT CONTRAST TECHNIQUE: Multidetector CT imaging of the head, cervical spine, and maxillofacial structures were performed using the standard protocol without intravenous contrast. Multiplanar CT image reconstructions of the cervical spine and maxillofacial structures were also generated. RADIATION DOSE REDUCTION: This exam was performed according to the departmental dose-optimization program which includes automated exposure control, adjustment of the mA and/or kV according to patient size and/or use of iterative reconstruction technique. COMPARISON:  None Available. FINDINGS: CT HEAD FINDINGS Brain: Patchy and confluent areas of decreased attenuation are noted throughout the deep and periventricular white matter of the cerebral hemispheres bilaterally, compatible with chronic microvascular ischemic disease. No evidence of large-territorial acute infarction. No parenchymal hemorrhage. No mass lesion. No extra-axial collection. No mass effect or midline shift. No hydrocephalus. Basilar cisterns are patent. Vascular: No hyperdense vessel. Atherosclerotic calcifications are present within the cavernous internal carotid arteries. Skull: No acute fracture or focal lesion. Other: None. CT MAXILLOFACIAL FINDINGS Osseous: No fracture or mandibular dislocation. No destructive process. Sinuses/Orbits: Paranasal sinuses and mastoid air cells are clear. Left lens replacement. Otherwise the orbits are unremarkable. Soft tissues: Right periorbital soft tissue defect. No retained radiopaque foreign body. CT CERVICAL SPINE FINDINGS Alignment: Mild retrolisthesis of C3 on C4. Grade 1 anterolisthesis of C4 on C5. Grade 1 anterolisthesis of C7 on T1. Skull base and vertebrae: Multilevel severe degenerative changes of the spine with associated  multilevel moderate severe osseous neural foraminal stenosis. No severe osseous central canal stenosis. No acute fracture. No aggressive appearing focal osseous lesion or focal pathologic process. Soft tissues and spinal canal: No prevertebral fluid or swelling. No visible canal hematoma. Upper chest: Unremarkable. Other: None. IMPRESSION: 1. No acute intracranial abnormality. 2. No acute displaced facial fracture. 3. No acute displaced fracture or traumatic listhesis of the cervical spine. Electronically Signed   By: Iven Finn M.D.   On: 08/01/2022 03:10    Assessment/Plan 1. Nausea and vomiting, unspecified vomiting type -bland diet advance as toleartes -make sure to stay hydrated  -cbc with diff to rule out infection and cmp to assess electrolytes, kidney function and liver  2. Cholecystitis Spoke with IR and increase in drainage is expected, to make sure she is not becoming dehydrated, will follow up CMP  -will follow up cbc to rule out infection. Staff to monitor closely and notify if worsening of symptoms, fever or chills.    Teresa Meyer. [place], [facility] [phone]

## 2022-08-20 ENCOUNTER — Other Ambulatory Visit: Payer: Self-pay | Admitting: Adult Health

## 2022-08-20 MED ORDER — SACCHAROMYCES BOULARDII 250 MG PO CAPS: 250.0000 mg | ORAL_CAPSULE | Freq: Two times a day (BID) | ORAL | 0 refills | Status: DC

## 2022-08-20 MED ORDER — DOXYCYCLINE HYCLATE 100 MG PO TABS: 100.0000 mg | ORAL_TABLET | Freq: Two times a day (BID) | ORAL | 0 refills | Status: DC

## 2022-08-20 NOTE — Progress Notes (Signed)
Charge nurse, Caryl Pina, called on-call provider reporting wbc 17 and has increased percutaneous drainage. Started on Doxycycline and Florastor.Marland Kitchen

## 2022-08-23 ENCOUNTER — Non-Acute Institutional Stay (SKILLED_NURSING_FACILITY): Payer: Medicare Other | Admitting: Student

## 2022-08-23 ENCOUNTER — Ambulatory Visit: Admission: RE | Admit: 2022-08-23 | Payer: Medicare Other | Source: Ambulatory Visit | Admitting: Radiology

## 2022-08-23 ENCOUNTER — Encounter: Payer: Self-pay | Admitting: Student

## 2022-08-23 DIAGNOSIS — K82A1 Gangrene of gallbladder in cholecystitis: Secondary | ICD-10-CM

## 2022-08-23 DIAGNOSIS — Z66 Do not resuscitate: Secondary | ICD-10-CM | POA: Diagnosis not present

## 2022-08-23 LAB — CBC AND DIFFERENTIAL
HCT: 37 (ref 36–46)
Hemoglobin: 12.6 (ref 12.0–16.0)
Neutrophils Absolute: 10300
Platelets: 410 10*3/uL — AB (ref 150–400)
WBC: 12.7

## 2022-08-23 LAB — CBC: RBC: 3.96 (ref 3.87–5.11)

## 2022-08-23 NOTE — Progress Notes (Unsigned)
Location:  [facility] Room Number: [facility] of Service:  SNF [phone]) Provider:  Dewayne Shorter, MD  Patient Care Team: Dewayne Shorter, MD as PCP - General (Family Medicine) Rolm Bookbinder, MD as Consulting Physician (Dermatology) [name], MD as Consulting Physician (Orthopedic Surgery) [name], MD as Referring Physician (Ophthalmology) Kipp Laurence, MD as Referring Physician (Audiology) Wandra Mannan, DMD as Referring Physician (Dentistry)  Extended Emergency Contact Information Primary Emergency Contact: Cloyd Stagers of [place] Phone: [phone] Relation: Daughter Secondary Emergency Contact: Marcello Moores Mobile Phone: [phone] Relation: Daughter  Code Status:  DNR Goals of care: Advanced Directive information    08/23/2022    8:50 AM  Advanced Directives  Does Patient Have a Medical Advance Directive? Yes  Type of Paramedic of [place];Out of facility DNR (pink MOST or yellow form)  Does patient want to make changes to medical advance directive? No - Patient declined  Copy of [place] in Chart? Yes - validated most recent copy scanned in chart (See row information)     Chief Complaint  Patient presents with   Acute Visit    Abdominal Pain.     HPI:  Pt is a 89 y.o. female seen today for an acute visit for patient declining to go to her appointment for exchanging her colostomy tube.   Spoke with Dr. Denna Haggard (IR physician who placed ostomy tube): He is worried that she missed her appointment today and is recommending a capping trial for her tube. His biggest concern is Dehydration as well as ascending cholangitis since the tube was placed beyond the ampulla.  Internal-external biliary drain is what she has. If she is not taking PO this could lead to rapid decline. Discussed waiting to see if WBC count improves and patient completes  empiric antibiotics. After capping- do labs to monitor white blood cell count as well. During th eplacement of the last tube He pushed a stone out of the CBD. They left the drain because they couldn't t get into go from the ampulla.They put the drain through the enitree system. They wanted to check the CBD again today and wanted to recheck T bili in 1 week. After the capping trial if it is tolerated. Asked to stay in touch for future management even if patient does not desire return to the hospital.   Patient states "There's no way I'm going back to that hello hole." I won't go back. Discussed patient's current situation that she has had more leakage and Dr. Denna Haggard states that he would like to have a clamping trial as long as she does not have a sign of infection. She has not had much of an appetite for the last three days but has been extremely thirsty. Discussed concern that she is becoming dehydrated because she is having increased excretion from her ostomy site. Patient states she does not want the tube clamped, "I'd rather just be left alone. I'm 89 years old. I'm gonna die with this tube, and I don't want to make any changes." Dr Denna Haggard was hoping to eventually remove the tube. "Okay, I hear what he wants to do, but I'd rather not fool with it anymore." If we don't proceed with clamping the tube, there is a possibility that you could get dehydrated and die, she states she is okay with this happening. Asked if she would be okay with palliative care or hospice care,  and she declines both of these resources at this time because "I'm fine with the care team that we have here."   I spoke with patient's daughter and HCPOA Jerlyn Ly regarding patient's care. She states "the family would like to clamp her tube based on Dr. Ky Barban Abd's recommendations." She also states that he should like to consult palliative care or hospice at this time. Discussed concern that all of these things would be contrary to the  discussion had with Ms. Seidl earlier this morning. Despite her status as HCPOA, patient is alert, oriented, and able to describe what could happen with or without treatment which is how capacity is determined.    Past Medical History:  Diagnosis Date   Acute gangrenous cholecystitis 02/12/2020   Arthritis    Atypical nevi 09/2014   lentiginous dysplastic nevus with mod atypia ([place])   Basal cell carcinoma 08/12/2017   R distal dorsum nose    Cholecystitis with gangrene of gallbladder    Diverticulitis    DNR (do not resuscitate)    Esophageal stricture 2012   GERD (gastroesophageal reflux disease)    HLD (hyperlipidemia)    did not tolerate statin, stopped checking   HTN (hypertension)    Hypokalemia    Hypomagnesemia    Hyponatremia    Leg wound, left, subsequent encounter 11/12/2017   Malignant melanoma 2015, 2017   R chin s/p excision ([place]) R upper back [name])   Malignant melanoma 07/27/19 [facility]   R lat deltoid   Multiple rib fractures 11/02/2017   Osteoporosis, post-menopausal    bisphosphonate caused dysphagia, requests defer DEXA for now   SCC (squamous cell carcinoma) 03/16/2022   R mid cheek, txt with 71f/[id number]/calcipotriene   SCC (squamous cell carcinoma) 04/26/2022   L lower calf,EDC   SCC (squamous cell carcinoma) 04/26/2022   R ant ankle, EDC   Seasonal allergies    Sepsis 02/12/2020   Skin lesion 08/17/2018   Squamous cell carcinoma of skin 08/21/2018   L med leg below knee   Squamous cell carcinoma of skin 01/20/2017   R pretibial lateral   Squamous cell carcinoma of skin 09/25/2020   Left med lower leg above ankle - EDC   Squamous cell carcinoma of skin 09/09/2021   L ankle, ED[facility]   Wears hearing aid    Past Surgical History:  Procedure Laterality Date   BUNIONECTOMY Right 2001   CARPAL TUNNEL RELEASE Bilateral 1974   CATARACT EXTRACTION Bilateral    [place]   IR CHOLANGIOGRAM EXISTING TUBE  04/04/2020   IR CHOLANGIOGRAM EXISTING TUBE   01/19/2021   IR EXCHANGE BILIARY DRAIN  02/26/2021   IR EXCHANGE BILIARY DRAIN  09/01/2021   IR EXCHANGE BILIARY DRAIN  12/04/2021   IR EXCHANGE BILIARY DRAIN  02/24/2022   IR EXCHANGE BILIARY DRAIN  05/26/2022   IR EXCHANGE BILIARY DRAIN  08/12/2022   IR EXCHANGE BILIARY DRAIN  08/17/2022   IR RADIOLOGIST EVAL & MGMT  01/22/2021   IR REMOVAL OF CALCULI/DEBRIS BILIARY DUCT/GB  02/26/2021   TONSILLECTOMY  1928    Allergies  Allergen Reactions   Other Rash and Other (See Comments)    Silk Suture - redness and severe pain   Bisphosphonates Other (See Comments)    Dysphagia to oral bisphosphonates   Codeine Nausea And Vomiting   Statins Other (See Comments)    Muscle aches   Sulfa Antibiotics     Can't recall reaction   Procaine Hcl Palpitations  Outpatient Encounter Medications as of 08/23/2022  Medication Sig   acetaminophen (TYLENOL) 500 MG tablet Take 500 mg by mouth every 4 (four) hours as needed for mild pain.   ammonium lactate (AMLACTIN) 12 % cream Apply 1 Application topically as needed for dry skin.   bisacodyl (DULCOLAX) 10 MG suppository Place 10 mg rectally daily as needed for moderate constipation.   carbamide peroxide (DEBROX) 6.5 % OTIC solution Place 5 drops into both ears as needed.   ciprofloxacin (CIPRO) 500 MG tablet Take 500 mg by mouth daily.   Emollient (GOLD BOND ULT ROUGH/BUMPY SKIN) CREA Apply 1 Application topically as needed. Trunk and extremities, see other listing   magnesium hydroxide (MILK OF MAGNESIA) 400 MG/5ML suspension Take 30 mLs by mouth daily as needed for mild constipation.   Menthol, Topical Analgesic, (EUCERIN ITCH RELIEF) 0.1 % LOTN Apply 1 Application topically every 8 (eight) hours as needed (For itching).   metroNIDAZOLE (FLAGYL) 500 MG tablet Take 500 mg by mouth 2 (two) times daily.   nepafenac (ILEVRO) 0.3 % ophthalmic suspension Place 1 drop into the left eye daily.    ondansetron (ZOFRAN) 4 MG tablet Take 4 mg by mouth every 8 (eight) hours  as needed for nausea or vomiting.   polyethylene glycol (MIRALAX / GLYCOLAX) 17 g packet Take 17 g by mouth daily as needed for moderate constipation.   Pramoxine-Menthol (GOLD BOND RAPID RELIEF) 1-1 % CREA Apply to feet, toes and other affected areas as needed for itch.   prednisoLONE acetate (PRED MILD) 0.12 % ophthalmic suspension Place 1 drop into the left eye daily.    Propylene Glycol (SYSTANE COMPLETE OP) Place 1 drop into the left eye every 8 (eight) hours as needed (dry eyes).   saccharomyces boulardii (FLORASTOR) 250 MG capsule Take 1 capsule (250 mg total) by mouth 2 (two) times daily for 13 days.   Vitamin D, Ergocalciferol, (DRISDOL) 1.25 MG (50000 UNIT) CAPS capsule Take 50,000 Units by mouth every 30 (thirty) days. Give on the 4th of every month.   [DISCONTINUED] doxycycline (VIBRA-TABS) 100 MG tablet Take 1 tablet (100 mg total) by mouth 2 (two) times daily for 10 days.   No facility-administered encounter medications on file as of 08/23/2022.    Review of Systems  Immunization History  Administered Date(s) Administered   Fluad Quad(high Dose 65+) 03/22/2019   Influenza, High Dose Seasonal PF 03/09/2022   Influenza,inj,Quad PF,6+ Mos 03/27/2013, 02/08/2017, 02/09/2018   Influenza-Unspecified 02/21/2014   Moderna Covid-19 Vaccine Bivalent Booster 48yrs & up 02/13/2021   Moderna SARS-COV2 Booster Vaccination 08/20/2020   Moderna Sars-Covid-2 Vaccination 06/05/2019, 07/03/2019, 04/02/2022   Pfizer Covid-19 Vaccine Bivalent Booster 57yrs & up 02/13/2021   Pneumococcal Polysaccharide-23 03/20/2012   Tdap 08/01/2022   Pertinent  Health Maintenance Due  Topic Date Due   INFLUENZA VACCINE  12/23/2022   DEXA SCAN  Completed      02/15/2020    9:00 AM 02/15/2020    9:49 AM 04/03/2020    9:55 AM 04/04/2020   10:58 AM 02/26/2021    7:29 AM  Fall Risk  Falls in the past year?   0    Was there an injury with Fall?   0    Fall Risk Category Calculator   0    Fall Risk Category  (Retired)   Low    (RETIRED) Patient Fall Risk Level High fall risk High fall risk High fall risk High fall risk Moderate fall risk   Functional Status Survey:  Vitals:   08/23/22 0843 08/23/22 0856  BP: (!) 142/76 (!) 153/76  Pulse: 80   Resp: 18   Temp: 97.7 F (36.5 C)   SpO2: 92%   Weight: 126 lb (57.2 kg)   Height: 5\' 3"  (1.6 m)    Body mass index is 22.32 kg/m. Physical Exam Cardiovascular:     Rate and Rhythm: Normal rate.     Pulses: Normal pulses.  Pulmonary:     Effort: Pulmonary effort is normal.  Abdominal:     Comments: Abdominal ostomy site with soiled gauze and 0.5L of green output.   Skin:    Coloration: Skin is pale.  Neurological:     General: No focal deficit present.     Mental Status: She is alert and oriented to person, place, and time. Mental status is at baseline.  Psychiatric:        Mood and Affect: Mood normal.        Behavior: Behavior normal.        Thought Content: Thought content normal.        Judgment: Judgment normal.     Labs reviewed: Recent Labs    07/08/22 0000 08/01/22 0120 08/19/22 0000  NA 139 138 134*  K 4.1 3.5 3.8  CL 106 105 95*  CO2 26* 23 27*  GLUCOSE  --  122*  --   BUN 18 19 31*  CREATININE 0.8 0.86 1.1  CALCIUM 9.4 9.4 9.6   Recent Labs    07/08/22 0000 08/01/22 0120 08/19/22 0000  AST 14 19 14   ALT 4* 10 5*  ALKPHOS 63 71 69  BILITOT  --  0.7  --   PROT  --  7.6  --   ALBUMIN 4.0 4.1 4.3   Recent Labs    07/08/22 0000 08/01/22 0120 08/19/22 0000  WBC 6.1 7.2 17.1  NEUTROABS 3,459.00 4.6 14,826.00  HGB 11.6* 12.0 12.1  HCT 35* 37.3 36  MCV  --  98.4  --   PLT 266 239 303   Lab Results  Component Value Date   TSH 2.721 10/11/2017   No results found for: "HGBA1C" Lab Results  Component Value Date   CHOL 271 (H) 09/17/2013   HDL 49.10 09/17/2013   LDLCALC 200 (H) 09/17/2013   TRIG 110.0 09/17/2013   CHOLHDL 6 09/17/2013    Significant Diagnostic Results in last 30 days:  IR  EXCHANGE BILIARY DRAIN  Result Date: 08/17/2022 INDICATION: Previous history: 89 year old female with history of cholelithiasis and choledocholithiasis with chronic indwelling cholecystostomy drain status post choledochoscope-assisted percutaneous gallstone retrieval on 02/26/2021 which was largely successful. The patient has persistent indwelling cholecystostomy tube as she has previously stated she does not want any further procedures done and does not mind having the tube in place. Current history: Over the past several months, the patient has now re-presented to the [facility] hospital with complaints of dislodged or leaking cholecystostomy tube. The patient has reported significant discomfort with cholecystostomy tube exchanges. She now presents today with a dislodged cholecystostomy tube. I had a lengthy discussion with patient's daughter about further management. It would appear that she had a previously successful procedure with Dr. Serafina Royals to percutaneously remove her gallstones. However, due to a combination of what appear to be a persistent gallstone in the common bile duct and her advanced age, a capping trial was not attempted and therefore she had a chronic cholecystostomy tube. I discussed with the daughter that her cystic duct has appear patent on  multiple previous examinations, and that if the common bile duct stone could be removed, she may be able to be to tube free given her the extremely negative impact the cholecystostomy tube has had on her the past year. The patient's daughter was agreeable for me to proceed with cholecystostomy tube replacement and possible stone removal, followed by possible future capping trial. EXAM: 1. Exchange of percutaneous cholecystostomy tube for a percutaneous biliary drainage catheter using fluoroscopic guidance 2. Percutaneous choledocholith extraction using fluoroscopic guidance MEDICATIONS: None ANESTHESIA/SEDATION: Local analgesia FLUOROSCOPY: Radiation Exposure  Index (as provided by the fluoroscopic device): 7.0 minutes (50 mGy) COMPLICATIONS: None immediate. PROCEDURE: Informed written consent was obtained from the patient after a thorough discussion of the procedural risks, benefits and alternatives. All questions were addressed. Maximal Sterile Barrier Technique was utilized including caps, mask, sterile gowns, sterile gloves, sterile drape, hand hygiene and skin antiseptic. A timeout was performed prior to the initiation of the procedure. Patient was placed supine on the exam table. The right upper quadrant was prepped and draped in the standard sterile fashion with the inclusion of the old percutaneous access site within the sterile field. An 035 catheter was advanced through the percutaneous defect, and gentle hand injection of contrast material was performed. This demonstrated immediate opacification of the gallbladder lumen and cystic duct. Given previous discussion with the patient's daughter, the decision was made to proceed with attempted catheterization of the common bile duct through this access and stone retrieval. Using combination of the 035 angled catheter and Bentson guidewire, wire and catheter were advanced into the common bile duct. Contrast injection of the common bile duct was performed, confirming location. A small circular filling defect was identified, compatible with the known choledocholith. Wire and catheter were then advanced into the proximal small bowel through the ampulla. Location was again confirmed with injection of contrast material. Wire was then exchanged for an Amplatz wire, followed by a short 6 French sheath advanced into the cystic duct. Then, a 5 [place] Fogarty balloon was advanced into the proximal common bile duct, inflated to its nominal size, and then advanced through the ampulla pushing the small gallstone out. Repeat injection of the common bile duct through the sheath demonstrated no further filling defects identified.  However, no contrast material was seen coursing through the ampulla, which may have been secondary to trauma caused by pushing the gallstone and Fogarty balloon through the ampulla. Therefore, the decision was made to leave a biliary drainage catheter in place through the ampulla to ensure adequate drainage until healing occurs. Over the Amplatz wire, a 63 [place] biliary drainage catheter was advanced, such as the terminal loop was located in the proximal small bowel. Injection of contrast material confirmed appropriate drainage of the common bile duct. The drainage catheter was then secured to the skin using a dressing, and attached to bag drainage. The patient tolerated the procedure well without immediate complication. IMPRESSION: 1. Successful percutaneous stone extraction of the known choledocholith in the common bile duct. 2. Successful exchange/replacement of the patient's cholecystostomy tube for a new percutaneous biliary drainage catheter via existing access. Biliary drainage catheter was left across the ampulla to allow for healing secondary to presumed trauma following stone extraction. 3. Patient to return to Interventional Radiology in 1 week for repeat sheath cholangiogram and possible safety catheter placement/capping trial. Assuming the ampulla is patent, the patient may return the following week for lab check and safety catheter/tube removal. Electronically Signed   By: Scherrie Bateman.D.  On: 08/17/2022 16:17   IR EXCHANGE BILIARY DRAIN  Result Date: 08/12/2022 INDICATION: Malfunctioning cholecystostomy tube, pain in leakage EXAM: Exchange of percutaneous cholecystostomy tube using fluoroscopic guidance MEDICATIONS: None ANESTHESIA/SEDATION: Local analgesia FLUOROSCOPY: Radiation Exposure Index (as provided by the fluoroscopic device): 2.5 minutes (13 mGy) COMPLICATIONS: None immediate. PROCEDURE: Informed written consent was obtained from the patient after a thorough discussion of the  procedural risks, benefits and alternatives. All questions were addressed. Maximal Sterile Barrier Technique was utilized including caps, mask, sterile gowns, sterile gloves, sterile drape, hand hygiene and skin antiseptic. A timeout was performed prior to the initiation of the procedure. Patient was placed supine on the exam table. The right upper quadrant was prepped and draped in the standard sterile fashion with inclusion of the existing cholecystostomy tube within the sterile field. Initial injection of the existing cholecystostomy tube demonstrated that it was retracted and partially in the gallbladder. While maintaining the locking loop, access into the gallbladder was obtained using a stiff angled Glidewire. Location was confirmed with looping of the wire in the gallbladder lumen. The locking loop was released, and over this wire the existing cholecystostomy tube was exchanged for a new, similar 14 French locking cholecystostomy tube. Locking loop was formed. Location of the gallbladder was confirmed with injection of contrast material. The drainage catheter was secured to the skin using a dressing. It was attached to bag drainage. Patient tolerated the procedure well without immediate complication. IMPRESSION: Successful exchange and repositioning of the patient's percutaneous cholecystostomy tube for a new, similar 14 French locking drainage catheter. Drainage catheter placed to bag drainage. The patient may return to Interventional Radiology in 4-6 months for routine exchange. Electronically Signed   By: Albin Felling M.D.   On: 08/12/2022 10:33   DG Chest 2 View  Result Date: 08/01/2022 CLINICAL DATA:  Mechanical fall. EXAM: CHEST - 2 VIEW COMPARISON:  02/12/2020 FINDINGS: Stable cardiomediastinal silhouette. Aortic atherosclerotic calcification. Chronic bronchitic change and bibasilar scarring/atelectasis. No focal consolidation, pleural effusion, or pneumothorax. Remote left rib fractures. No  evidence of acute rib fracture. Demineralization. IMPRESSION: No acute cardiopulmonary process. Electronically Signed   By: Placido Sou M.D.   On: 08/01/2022 03:14   CT HEAD WO CONTRAST (5MM)  Result Date: 08/01/2022 CLINICAL DATA:  Head trauma, minor (Age >= 65y); Facial trauma, blunt; Neck trauma (Age >= 65y) EXAM: CT HEAD WITHOUT CONTRAST CT MAXILLOFACIAL WITHOUT CONTRAST CT CERVICAL SPINE WITHOUT CONTRAST TECHNIQUE: Multidetector CT imaging of the head, cervical spine, and maxillofacial structures were performed using the standard protocol without intravenous contrast. Multiplanar CT image reconstructions of the cervical spine and maxillofacial structures were also generated. RADIATION DOSE REDUCTION: This exam was performed according to the departmental dose-optimization program which includes automated exposure control, adjustment of the mA and/or kV according to patient size and/or use of iterative reconstruction technique. COMPARISON:  None Available. FINDINGS: CT HEAD FINDINGS Brain: Patchy and confluent areas of decreased attenuation are noted throughout the deep and periventricular white matter of the cerebral hemispheres bilaterally, compatible with chronic microvascular ischemic disease. No evidence of large-territorial acute infarction. No parenchymal hemorrhage. No mass lesion. No extra-axial collection. No mass effect or midline shift. No hydrocephalus. Basilar cisterns are patent. Vascular: No hyperdense vessel. Atherosclerotic calcifications are present within the cavernous internal carotid arteries. Skull: No acute fracture or focal lesion. Other: None. CT MAXILLOFACIAL FINDINGS Osseous: No fracture or mandibular dislocation. No destructive process. Sinuses/Orbits: Paranasal sinuses and mastoid air cells are clear. Left lens replacement. Otherwise the orbits are  unremarkable. Soft tissues: Right periorbital soft tissue defect. No retained radiopaque foreign body. CT CERVICAL SPINE FINDINGS  Alignment: Mild retrolisthesis of C3 on C4. Grade 1 anterolisthesis of C4 on C5. Grade 1 anterolisthesis of C7 on T1. Skull base and vertebrae: Multilevel severe degenerative changes of the spine with associated multilevel moderate severe osseous neural foraminal stenosis. No severe osseous central canal stenosis. No acute fracture. No aggressive appearing focal osseous lesion or focal pathologic process. Soft tissues and spinal canal: No prevertebral fluid or swelling. No visible canal hematoma. Upper chest: Unremarkable. Other: None. IMPRESSION: 1. No acute intracranial abnormality. 2. No acute displaced facial fracture. 3. No acute displaced fracture or traumatic listhesis of the cervical spine. Electronically Signed   By: Iven Finn M.D.   On: 08/01/2022 03:10   CT Cervical Spine Wo Contrast  Result Date: 08/01/2022 CLINICAL DATA:  Head trauma, minor (Age >= 65y); Facial trauma, blunt; Neck trauma (Age >= 65y) EXAM: CT HEAD WITHOUT CONTRAST CT MAXILLOFACIAL WITHOUT CONTRAST CT CERVICAL SPINE WITHOUT CONTRAST TECHNIQUE: Multidetector CT imaging of the head, cervical spine, and maxillofacial structures were performed using the standard protocol without intravenous contrast. Multiplanar CT image reconstructions of the cervical spine and maxillofacial structures were also generated. RADIATION DOSE REDUCTION: This exam was performed according to the departmental dose-optimization program which includes automated exposure control, adjustment of the mA and/or kV according to patient size and/or use of iterative reconstruction technique. COMPARISON:  None Available. FINDINGS: CT HEAD FINDINGS Brain: Patchy and confluent areas of decreased attenuation are noted throughout the deep and periventricular white matter of the cerebral hemispheres bilaterally, compatible with chronic microvascular ischemic disease. No evidence of large-territorial acute infarction. No parenchymal hemorrhage. No mass lesion. No  extra-axial collection. No mass effect or midline shift. No hydrocephalus. Basilar cisterns are patent. Vascular: No hyperdense vessel. Atherosclerotic calcifications are present within the cavernous internal carotid arteries. Skull: No acute fracture or focal lesion. Other: None. CT MAXILLOFACIAL FINDINGS Osseous: No fracture or mandibular dislocation. No destructive process. Sinuses/Orbits: Paranasal sinuses and mastoid air cells are clear. Left lens replacement. Otherwise the orbits are unremarkable. Soft tissues: Right periorbital soft tissue defect. No retained radiopaque foreign body. CT CERVICAL SPINE FINDINGS Alignment: Mild retrolisthesis of C3 on C4. Grade 1 anterolisthesis of C4 on C5. Grade 1 anterolisthesis of C7 on T1. Skull base and vertebrae: Multilevel severe degenerative changes of the spine with associated multilevel moderate severe osseous neural foraminal stenosis. No severe osseous central canal stenosis. No acute fracture. No aggressive appearing focal osseous lesion or focal pathologic process. Soft tissues and spinal canal: No prevertebral fluid or swelling. No visible canal hematoma. Upper chest: Unremarkable. Other: None. IMPRESSION: 1. No acute intracranial abnormality. 2. No acute displaced facial fracture. 3. No acute displaced fracture or traumatic listhesis of the cervical spine. Electronically Signed   By: Iven Finn M.D.   On: 08/01/2022 03:10   CT Maxillofacial Wo Contrast  Result Date: 08/01/2022 CLINICAL DATA:  Head trauma, minor (Age >= 65y); Facial trauma, blunt; Neck trauma (Age >= 65y) EXAM: CT HEAD WITHOUT CONTRAST CT MAXILLOFACIAL WITHOUT CONTRAST CT CERVICAL SPINE WITHOUT CONTRAST TECHNIQUE: Multidetector CT imaging of the head, cervical spine, and maxillofacial structures were performed using the standard protocol without intravenous contrast. Multiplanar CT image reconstructions of the cervical spine and maxillofacial structures were also generated. RADIATION  DOSE REDUCTION: This exam was performed according to the departmental dose-optimization program which includes automated exposure control, adjustment of the mA and/or kV according to patient size  and/or use of iterative reconstruction technique. COMPARISON:  None Available. FINDINGS: CT HEAD FINDINGS Brain: Patchy and confluent areas of decreased attenuation are noted throughout the deep and periventricular white matter of the cerebral hemispheres bilaterally, compatible with chronic microvascular ischemic disease. No evidence of large-territorial acute infarction. No parenchymal hemorrhage. No mass lesion. No extra-axial collection. No mass effect or midline shift. No hydrocephalus. Basilar cisterns are patent. Vascular: No hyperdense vessel. Atherosclerotic calcifications are present within the cavernous internal carotid arteries. Skull: No acute fracture or focal lesion. Other: None. CT MAXILLOFACIAL FINDINGS Osseous: No fracture or mandibular dislocation. No destructive process. Sinuses/Orbits: Paranasal sinuses and mastoid air cells are clear. Left lens replacement. Otherwise the orbits are unremarkable. Soft tissues: Right periorbital soft tissue defect. No retained radiopaque foreign body. CT CERVICAL SPINE FINDINGS Alignment: Mild retrolisthesis of C3 on C4. Grade 1 anterolisthesis of C4 on C5. Grade 1 anterolisthesis of C7 on T1. Skull base and vertebrae: Multilevel severe degenerative changes of the spine with associated multilevel moderate severe osseous neural foraminal stenosis. No severe osseous central canal stenosis. No acute fracture. No aggressive appearing focal osseous lesion or focal pathologic process. Soft tissues and spinal canal: No prevertebral fluid or swelling. No visible canal hematoma. Upper chest: Unremarkable. Other: None. IMPRESSION: 1. No acute intracranial abnormality. 2. No acute displaced facial fracture. 3. No acute displaced fracture or traumatic listhesis of the cervical  spine. Electronically Signed   By: Iven Finn M.D.   On: 08/01/2022 03:10    Assessment/Plan 1. Gangrene of gallbladder in cholecystitis Numerous conversations with consulting physician, family, patient, care team, etc regarding next steps for management of patient's ostomy tube. Concern for dehydration leading to patient's decline. Encouraged PO intake and hydration. Will discuss capping trial with lab results on Wednesday. Consider consulting palliative if patient is amenable.   2. DNR (do not resuscitate) Maintains DNR status. Will continue goals of care conversations regarding interventions and management of the tube.    Family/ staff Communication: Nursing, [name])  Labs/tests ordered:  CBC, CMP  I spent greater than 1 hour for the care of this patient in face to face time, lab review, chart review, discussions with consulting providers, >30 minutes in goals of care conversations as well.

## 2022-08-25 ENCOUNTER — Encounter: Payer: Self-pay | Admitting: Student

## 2022-08-25 ENCOUNTER — Non-Acute Institutional Stay (SKILLED_NURSING_FACILITY): Payer: Medicare Other | Admitting: Student

## 2022-08-25 DIAGNOSIS — Z7189 Other specified counseling: Secondary | ICD-10-CM | POA: Diagnosis not present

## 2022-08-25 NOTE — Progress Notes (Signed)
Location:  Other [place].  Nursing Home Room Number: [facility] 207A Place of Service:    Provider:  Earnestine Mealing, MD  Patient Care Team: Earnestine Mealing, MD as PCP - General (Family Medicine) Venancio Poisson, MD as Consulting Physician (Dermatology) Ernest Pine Illene Labrador, MD as Consulting Physician (Orthopedic Surgery) Sallee Lange, MD as Referring Physician (Ophthalmology) Fransico Michael, MD as Referring Physician (Audiology) Trellis Paganini, DMD as Referring Physician (Dentistry)  Extended Emergency Contact Information Primary Emergency Contact: Augusto Garbe of [place] Home Phone: [phone] Relation: Daughter Secondary Emergency Contact: Foster Simpson Mobile Phone: [phone] Relation: Daughter  Code Status:  DNR Goals of care: Advanced Directive information    08/25/2022   10:26 AM  Advanced Directives  Type of Advance Directive Healthcare Power of [place];Out of facility DNR (pink MOST or yellow form)  Copy of Healthcare Power of Attorney in Chart? Yes - validated most recent copy scanned in chart (See row information)     Chief Complaint  Patient presents with   Acute Visit    Follow up Ostomy Tube.     HPI:  Pt is a 89 y.o. female seen today for an acute visit for follow up. Patient's white blood cell count improved and based on conversation with Dr. Juliette Alcide on Monday and Daughter, Montez Morita, on Monday, the plan would be to start capping trial today.   Upon entering room, patient is sleeping in her bed. She says, "Hello, Dr. Sydnee Cabal. I'm good with God and am ready to start the process." I ask patient what she means by this. I stated, you are not eating and stopped taking her antibiotics. She says, "No, Shit! What good is that [place] do? It's just prolonging the inevitable. I don't want anyone else involved. You all have done a good job taking care of me. I'm good with God. Don't fool with my bag. Tell Montez Morita I would love to see her, but  if she is coming to meddle, to stay away!" "Just leave me alone, I'm good with God."  I discussed concern that leaving the bag alone would lead to dehydration, which she states she understands and she is okay with that. I said, "You know this means you will die?" And she says yes, she is okay with that. She is continuing to drink water. She denies pain at this time. I asked if she would be okay with hospice and she states she doesn't want anyone else involved. I asked if she has discussed these concerns with her daughters, and she says I haven't looked at my computer in a week and I'm not going to. If they want to talk to me, they can come here. I asked if she is depressed, she says, "No I am miserable. I'm almost 89 and I wish I could go back 50 years. I've had a good life. I'm good with God. Please leave me alone." I asked if she has ever felt this way before, she says, "No, have you?"   I called patient's daughter Montez Morita) to reiterate her mother's wishes and how she declined interventions for her ostomy tube at this time. She states she will plan to come on Friday so we can all have a family meeting with Ms. Anglemyer involved.    Past Medical History:  Diagnosis Date   Acute gangrenous cholecystitis 02/12/2020   Arthritis    Atypical nevi 09/2014   lentiginous dysplastic nevus with mod atypia ([place])   Basal cell carcinoma 08/12/2017  R distal dorsum nose    Cholecystitis with gangrene of gallbladder    Diverticulitis    DNR (do not resuscitate)    Esophageal stricture 2012   GERD (gastroesophageal reflux disease)    HLD (hyperlipidemia)    did not tolerate statin, stopped checking   HTN (hypertension)    Hypokalemia    Hypomagnesemia    Hyponatremia    Leg wound, left, subsequent encounter 11/12/2017   Malignant melanoma 2015, 2017   R chin s/p excision ([place]) R upper back [place])   Malignant melanoma 07/27/19 [facility]   R lat deltoid   Multiple rib fractures 11/02/2017    Osteoporosis, post-menopausal    bisphosphonate caused dysphagia, requests defer DEXA for now   SCC (squamous cell carcinoma) 03/16/2022   R mid cheek, txt with [id number]/calcipotriene   SCC (squamous cell carcinoma) 04/26/2022   L lower calf,EDC   SCC (squamous cell carcinoma) 04/26/2022   R ant ankle, EDC   Seasonal allergies    Sepsis 02/12/2020   Skin lesion 08/17/2018   Squamous cell carcinoma of skin 08/21/2018   L med leg below knee   Squamous cell carcinoma of skin 01/20/2017   R pretibial lateral   Squamous cell carcinoma of skin 09/25/2020   Left med lower leg above ankle - EDC   Squamous cell carcinoma of skin 09/09/2021   L ankle, EDC   Wears hearing aid    Past Surgical History:  Procedure Laterality Date   BUNIONECTOMY Right 2001   CARPAL TUNNEL RELEASE Bilateral 1974   CATARACT EXTRACTION Bilateral    [place]   IR CHOLANGIOGRAM EXISTING TUBE  04/04/2020   IR CHOLANGIOGRAM EXISTING TUBE  01/19/2021   IR EXCHANGE BILIARY DRAIN  02/26/2021   IR EXCHANGE BILIARY DRAIN  09/01/2021   IR EXCHANGE BILIARY DRAIN  12/04/2021   IR EXCHANGE BILIARY DRAIN  02/24/2022   IR EXCHANGE BILIARY DRAIN  05/26/2022   IR EXCHANGE BILIARY DRAIN  08/12/2022   IR EXCHANGE BILIARY DRAIN  08/17/2022   IR RADIOLOGIST EVAL & MGMT  01/22/2021   IR REMOVAL OF CALCULI/DEBRIS BILIARY DUCT/GB  02/26/2021   TONSILLECTOMY  1928    Allergies  Allergen Reactions   Other Rash and Other (See Comments)    Silk Suture - redness and severe pain   Bisphosphonates Other (See Comments)    Dysphagia to oral bisphosphonates   Codeine Nausea And Vomiting   Statins Other (See Comments)    Muscle aches   Sulfa Antibiotics     Can't recall reaction   Procaine Hcl Palpitations    Outpatient Encounter Medications as of 08/25/2022  Medication Sig   acetaminophen (TYLENOL) 500 MG tablet Take 500 mg by mouth every 4 (four) hours as needed for mild pain.   ammonium lactate (AMLACTIN) 12 % cream Apply 1 Application  topically as needed for dry skin.   bisacodyl (DULCOLAX) 10 MG suppository Place 10 mg rectally daily as needed for moderate constipation.   carbamide peroxide (DEBROX) 6.5 % OTIC solution Place 5 drops into both ears as needed.   ciprofloxacin (CIPRO) 500 MG tablet Take 500 mg by mouth daily.   Emollient (GOLD BOND ULT ROUGH/BUMPY SKIN) CREA Apply 1 Application topically as needed. Trunk and extremities, see other listing   magnesium hydroxide (MILK OF MAGNESIA) 400 MG/5ML suspension Take 30 mLs by mouth daily as needed for mild constipation.   Menthol, Topical Analgesic, (EUCERIN ITCH RELIEF) 0.1 % LOTN Apply 1 Application topically every 8 (eight)  hours as needed (For itching).   metroNIDAZOLE (FLAGYL) 500 MG tablet Take 500 mg by mouth 2 (two) times daily.   nepafenac (ILEVRO) 0.3 % ophthalmic suspension Place 1 drop into the left eye daily.    ondansetron (ZOFRAN) 4 MG tablet Take 4 mg by mouth every 8 (eight) hours as needed for nausea or vomiting.   polyethylene glycol (MIRALAX / GLYCOLAX) 17 g packet Take 17 g by mouth daily as needed for moderate constipation.   Pramoxine-Menthol (GOLD BOND RAPID RELIEF) 1-1 % CREA Apply to feet, toes and other affected areas as needed for itch.   prednisoLONE acetate (PRED MILD) 0.12 % ophthalmic suspension Place 1 drop into the left eye daily.    Propylene Glycol (SYSTANE COMPLETE OP) Place 1 drop into the left eye every 8 (eight) hours as needed (dry eyes).   saccharomyces boulardii (FLORASTOR) 250 MG capsule Take 1 capsule (250 mg total) by mouth 2 (two) times daily for 13 days.   Vitamin D, Ergocalciferol, (DRISDOL) 1.25 MG (50000 UNIT) CAPS capsule Take 50,000 Units by mouth every 30 (thirty) days. Give on the 4th of every month.   No facility-administered encounter medications on file as of 08/25/2022.    Review of Systems  Immunization History  Administered Date(s) Administered   Fluad Quad(high Dose 65+) 03/22/2019   Influenza, High Dose  Seasonal PF 03/09/2022   Influenza,inj,Quad PF,6+ Mos 03/27/2013, 02/08/2017, 02/09/2018   Influenza-Unspecified 02/21/2014   Moderna Covid-19 Vaccine Bivalent Booster 64yrs & up 02/13/2021   Moderna SARS-COV2 Booster Vaccination 08/20/2020   Moderna Sars-Covid-2 Vaccination 06/05/2019, 07/03/2019, 04/02/2022   Pfizer Covid-19 Vaccine Bivalent Booster 68yrs & up 02/13/2021   Pneumococcal Polysaccharide-23 03/20/2012   Tdap 08/01/2022   Pertinent  Health Maintenance Due  Topic Date Due   INFLUENZA VACCINE  12/23/2022   DEXA SCAN  Completed      02/15/2020    9:00 AM 02/15/2020    9:49 AM 04/03/2020    9:55 AM 04/04/2020   10:58 AM 02/26/2021    7:29 AM  Fall Risk  Falls in the past year?   0    Was there an injury with Fall?   0    Fall Risk Category Calculator   0    Fall Risk Category (Retired)   Low    (RETIRED) Patient Fall Risk Level High fall risk High fall risk High fall risk High fall risk Moderate fall risk   Functional Status Survey:    Vitals:   08/25/22 1021 08/25/22 1029  BP: (!) 142/76 (!) 153/76  Pulse: 80   Resp: 18   Temp: (!) 97.5 F (36.4 C)   SpO2: 92%   Weight: 126 lb (57.2 kg)   Height: 5\' 3"  (1.6 m)    Body mass index is 22.32 kg/m. Physical Exam Constitutional:      Comments: Patient lying comfortably in bed.   Abdominal:     Comments: Ostomy tube bandage clean/dry/intacked. ~500 ML of drainage in place.   Neurological:     Mental Status: She is oriented to person, place, and time. Mental status is at baseline.     Labs reviewed: Recent Labs    07/08/22 0000 08/01/22 0120 08/19/22 0000  NA 139 138 134*  K 4.1 3.5 3.8  CL 106 105 95*  CO2 26* 23 27*  GLUCOSE  --  122*  --   BUN 18 19 31*  CREATININE 0.8 0.86 1.1  CALCIUM 9.4 9.4 9.6   Recent Labs  07/08/22 0000 08/01/22 0120 08/19/22 0000  AST 14 19 14   ALT 4* 10 5*  ALKPHOS 63 71 69  BILITOT  --  0.7  --   PROT  --  7.6  --   ALBUMIN 4.0 4.1 4.3   Recent Labs     08/01/22 0120 08/19/22 0000 08/23/22 0000  WBC 7.2 17.1 12.7  NEUTROABS 4.6 14,826.00 10,300.00  HGB 12.0 12.1 12.6  HCT 37.3 36 37  MCV 98.4  --   --   PLT 239 303 410*   Lab Results  Component Value Date   TSH 2.721 10/11/2017   No results found for: "HGBA1C" Lab Results  Component Value Date   CHOL 271 (H) 09/17/2013   HDL 49.10 09/17/2013   LDLCALC 200 (H) 09/17/2013   TRIG 110.0 09/17/2013   CHOLHDL 6 09/17/2013    Significant Diagnostic Results in last 30 days:  IR EXCHANGE BILIARY DRAIN  Result Date: 08/17/2022 INDICATION: Previous history: 89 year old female with history of cholelithiasis and choledocholithiasis with chronic indwelling cholecystostomy drain status post choledochoscope-assisted percutaneous gallstone retrieval on 02/26/2021 which was largely successful. The patient has persistent indwelling cholecystostomy tube as she has previously stated she does not want any further procedures done and does not mind having the tube in place. Current history: Over the past several months, the patient has now re-presented to the [facility] hospital with complaints of dislodged or leaking cholecystostomy tube. The patient has reported significant discomfort with cholecystostomy tube exchanges. She now presents today with a dislodged cholecystostomy tube. I had a lengthy discussion with patient's daughter about further management. It would appear that she had a previously successful procedure with Dr. Elby Showers to percutaneously remove her gallstones. However, due to a combination of what appear to be a persistent gallstone in the common bile duct and her advanced age, a capping trial was not attempted and therefore she had a chronic cholecystostomy tube. I discussed with the daughter that her cystic duct has appear patent on multiple previous examinations, and that if the common bile duct stone could be removed, she may be able to be to tube free given her the extremely negative impact the  cholecystostomy tube has had on her the past year. The patient's daughter was agreeable for me to proceed with cholecystostomy tube replacement and possible stone removal, followed by possible future capping trial. EXAM: 1. Exchange of percutaneous cholecystostomy tube for a percutaneous biliary drainage catheter using fluoroscopic guidance 2. Percutaneous choledocholith extraction using fluoroscopic guidance MEDICATIONS: None ANESTHESIA/SEDATION: Local analgesia FLUOROSCOPY: Radiation Exposure Index (as provided by the fluoroscopic device): 7.0 minutes (50 mGy) COMPLICATIONS: None immediate. PROCEDURE: Informed written consent was obtained from the patient after a thorough discussion of the procedural risks, benefits and alternatives. All questions were addressed. Maximal Sterile Barrier Technique was utilized including caps, mask, sterile gowns, sterile gloves, sterile drape, hand hygiene and skin antiseptic. A timeout was performed prior to the initiation of the procedure. Patient was placed supine on the exam table. The right upper quadrant was prepped and draped in the standard sterile fashion with the inclusion of the old percutaneous access site within the sterile field. An 035 catheter was advanced through the percutaneous defect, and gentle hand injection of contrast material was performed. This demonstrated immediate opacification of the gallbladder lumen and cystic duct. Given previous discussion with the patient's daughter, the decision was made to proceed with attempted catheterization of the common bile duct through this access and stone retrieval. Using combination of the 035  angled catheter and Bentson guidewire, wire and catheter were advanced into the common bile duct. Contrast injection of the common bile duct was performed, confirming location. A small circular filling defect was identified, compatible with the known choledocholith. Wire and catheter were then advanced into the proximal small  bowel through the ampulla. Location was again confirmed with injection of contrast material. Wire was then exchanged for an Amplatz wire, followed by a short 6 French sheath advanced into the cystic duct. Then, a 5 [place] Fogarty balloon was advanced into the proximal common bile duct, inflated to its nominal size, and then advanced through the ampulla pushing the small gallstone out. Repeat injection of the common bile duct through the sheath demonstrated no further filling defects identified. However, no contrast material was seen coursing through the ampulla, which may have been secondary to trauma caused by pushing the gallstone and Fogarty balloon through the ampulla. Therefore, the decision was made to leave a biliary drainage catheter in place through the ampulla to ensure adequate drainage until healing occurs. Over the Amplatz wire, a 12 [place] biliary drainage catheter was advanced, such as the terminal loop was located in the proximal small bowel. Injection of contrast material confirmed appropriate drainage of the common bile duct. The drainage catheter was then secured to the skin using a dressing, and attached to bag drainage. The patient tolerated the procedure well without immediate complication. IMPRESSION: 1. Successful percutaneous stone extraction of the known choledocholith in the common bile duct. 2. Successful exchange/replacement of the patient's cholecystostomy tube for a new percutaneous biliary drainage catheter via existing access. Biliary drainage catheter was left across the ampulla to allow for healing secondary to presumed trauma following stone extraction. 3. Patient to return to Interventional Radiology in 1 week for repeat sheath cholangiogram and possible safety catheter placement/capping trial. Assuming the ampulla is patent, the patient may return the following week for lab check and safety catheter/tube removal. Electronically Signed   By: Olive Bass M.D.   On: 08/17/2022  16:17   IR EXCHANGE BILIARY DRAIN  Result Date: 08/12/2022 INDICATION: Malfunctioning cholecystostomy tube, pain in leakage EXAM: Exchange of percutaneous cholecystostomy tube using fluoroscopic guidance MEDICATIONS: None ANESTHESIA/SEDATION: Local analgesia FLUOROSCOPY: Radiation Exposure Index (as provided by the fluoroscopic device): 2.5 minutes (13 mGy) COMPLICATIONS: None immediate. PROCEDURE: Informed written consent was obtained from the patient after a thorough discussion of the procedural risks, benefits and alternatives. All questions were addressed. Maximal Sterile Barrier Technique was utilized including caps, mask, sterile gowns, sterile gloves, sterile drape, hand hygiene and skin antiseptic. A timeout was performed prior to the initiation of the procedure. Patient was placed supine on the exam table. The right upper quadrant was prepped and draped in the standard sterile fashion with inclusion of the existing cholecystostomy tube within the sterile field. Initial injection of the existing cholecystostomy tube demonstrated that it was retracted and partially in the gallbladder. While maintaining the locking loop, access into the gallbladder was obtained using a stiff angled Glidewire. Location was confirmed with looping of the wire in the gallbladder lumen. The locking loop was released, and over this wire the existing cholecystostomy tube was exchanged for a new, similar 14 French locking cholecystostomy tube. Locking loop was formed. Location of the gallbladder was confirmed with injection of contrast material. The drainage catheter was secured to the skin using a dressing. It was attached to bag drainage. Patient tolerated the procedure well without immediate complication. IMPRESSION: Successful exchange and repositioning of the patient's percutaneous  cholecystostomy tube for a new, similar 14 French locking drainage catheter. Drainage catheter placed to bag drainage. The patient may return to  Interventional Radiology in 4-6 months for routine exchange. Electronically Signed   By: Olive Bass M.D.   On: 08/12/2022 10:33   DG Chest 2 View  Result Date: 08/01/2022 CLINICAL DATA:  Mechanical fall. EXAM: CHEST - 2 VIEW COMPARISON:  02/12/2020 FINDINGS: Stable cardiomediastinal silhouette. Aortic atherosclerotic calcification. Chronic bronchitic change and bibasilar scarring/atelectasis. No focal consolidation, pleural effusion, or pneumothorax. Remote left rib fractures. No evidence of acute rib fracture. Demineralization. IMPRESSION: No acute cardiopulmonary process. Electronically Signed   By: Minerva Fester M.D.   On: 08/01/2022 03:14   CT HEAD WO CONTRAST ( )  Result Date: 08/01/2022 CLINICAL DATA:  Head trauma, minor (Age >= 65y); Facial trauma, blunt; Neck trauma (Age >= 65y) EXAM: CT HEAD WITHOUT CONTRAST CT MAXILLOFACIAL WITHOUT CONTRAST CT CERVICAL SPINE WITHOUT CONTRAST TECHNIQUE: Multidetector CT imaging of the head, cervical spine, and maxillofacial structures were performed using the standard protocol without intravenous contrast. Multiplanar CT image reconstructions of the cervical spine and maxillofacial structures were also generated. RADIATION DOSE REDUCTION: This exam was performed according to the departmental dose-optimization program which includes automated exposure control, adjustment of the mA and/or kV according to patient size and/or use of iterative reconstruction technique. COMPARISON:  None Available. FINDINGS: CT HEAD FINDINGS Brain: Patchy and confluent areas of decreased attenuation are noted throughout the deep and periventricular white matter of the cerebral hemispheres bilaterally, compatible with chronic microvascular ischemic disease. No evidence of large-territorial acute infarction. No parenchymal hemorrhage. No mass lesion. No extra-axial collection. No mass effect or midline shift. No hydrocephalus. Basilar cisterns are patent. Vascular: No hyperdense  vessel. Atherosclerotic calcifications are present within the cavernous internal carotid arteries. Skull: No acute fracture or focal lesion. Other: None. CT MAXILLOFACIAL FINDINGS Osseous: No fracture or mandibular dislocation. No destructive process. Sinuses/Orbits: Paranasal sinuses and mastoid air cells are clear. Left lens replacement. Otherwise the orbits are unremarkable. Soft tissues: Right periorbital soft tissue defect. No retained radiopaque foreign body. CT CERVICAL SPINE FINDINGS Alignment: Mild retrolisthesis of C3 on C4. Grade 1 anterolisthesis of C4 on C5. Grade 1 anterolisthesis of C7 on T1. Skull base and vertebrae: Multilevel severe degenerative changes of the spine with associated multilevel moderate severe osseous neural foraminal stenosis. No severe osseous central canal stenosis. No acute fracture. No aggressive appearing focal osseous lesion or focal pathologic process. Soft tissues and spinal canal: No prevertebral fluid or swelling. No visible canal hematoma. Upper chest: Unremarkable. Other: None. IMPRESSION: 1. No acute intracranial abnormality. 2. No acute displaced facial fracture. 3. No acute displaced fracture or traumatic listhesis of the cervical spine. Electronically Signed   By: Tish Frederickson M.D.   On: 08/01/2022 03:10   CT Cervical Spine Wo Contrast  Result Date: 08/01/2022 CLINICAL DATA:  Head trauma, minor (Age >= 65y); Facial trauma, blunt; Neck trauma (Age >= 65y) EXAM: CT HEAD WITHOUT CONTRAST CT MAXILLOFACIAL WITHOUT CONTRAST CT CERVICAL SPINE WITHOUT CONTRAST TECHNIQUE: Multidetector CT imaging of the head, cervical spine, and maxillofacial structures were performed using the standard protocol without intravenous contrast. Multiplanar CT image reconstructions of the cervical spine and maxillofacial structures were also generated. RADIATION DOSE REDUCTION: This exam was performed according to the departmental dose-optimization program which includes automated exposure  control, adjustment of the mA and/or kV according to patient size and/or use of iterative reconstruction technique. COMPARISON:  None Available. FINDINGS: CT HEAD FINDINGS Brain: Patchy  and confluent areas of decreased attenuation are noted throughout the deep and periventricular white matter of the cerebral hemispheres bilaterally, compatible with chronic microvascular ischemic disease. No evidence of large-territorial acute infarction. No parenchymal hemorrhage. No mass lesion. No extra-axial collection. No mass effect or midline shift. No hydrocephalus. Basilar cisterns are patent. Vascular: No hyperdense vessel. Atherosclerotic calcifications are present within the cavernous internal carotid arteries. Skull: No acute fracture or focal lesion. Other: None. CT MAXILLOFACIAL FINDINGS Osseous: No fracture or mandibular dislocation. No destructive process. Sinuses/Orbits: Paranasal sinuses and mastoid air cells are clear. Left lens replacement. Otherwise the orbits are unremarkable. Soft tissues: Right periorbital soft tissue defect. No retained radiopaque foreign body. CT CERVICAL SPINE FINDINGS Alignment: Mild retrolisthesis of C3 on C4. Grade 1 anterolisthesis of C4 on C5. Grade 1 anterolisthesis of C7 on T1. Skull base and vertebrae: Multilevel severe degenerative changes of the spine with associated multilevel moderate severe osseous neural foraminal stenosis. No severe osseous central canal stenosis. No acute fracture. No aggressive appearing focal osseous lesion or focal pathologic process. Soft tissues and spinal canal: No prevertebral fluid or swelling. No visible canal hematoma. Upper chest: Unremarkable. Other: None. IMPRESSION: 1. No acute intracranial abnormality. 2. No acute displaced facial fracture. 3. No acute displaced fracture or traumatic listhesis of the cervical spine. Electronically Signed   By: Tish Frederickson M.D.   On: 08/01/2022 03:10   CT Maxillofacial Wo Contrast  Result Date:  08/01/2022 CLINICAL DATA:  Head trauma, minor (Age >= 65y); Facial trauma, blunt; Neck trauma (Age >= 65y) EXAM: CT HEAD WITHOUT CONTRAST CT MAXILLOFACIAL WITHOUT CONTRAST CT CERVICAL SPINE WITHOUT CONTRAST TECHNIQUE: Multidetector CT imaging of the head, cervical spine, and maxillofacial structures were performed using the standard protocol without intravenous contrast. Multiplanar CT image reconstructions of the cervical spine and maxillofacial structures were also generated. RADIATION DOSE REDUCTION: This exam was performed according to the departmental dose-optimization program which includes automated exposure control, adjustment of the mA and/or kV according to patient size and/or use of iterative reconstruction technique. COMPARISON:  None Available. FINDINGS: CT HEAD FINDINGS Brain: Patchy and confluent areas of decreased attenuation are noted throughout the deep and periventricular white matter of the cerebral hemispheres bilaterally, compatible with chronic microvascular ischemic disease. No evidence of large-territorial acute infarction. No parenchymal hemorrhage. No mass lesion. No extra-axial collection. No mass effect or midline shift. No hydrocephalus. Basilar cisterns are patent. Vascular: No hyperdense vessel. Atherosclerotic calcifications are present within the cavernous internal carotid arteries. Skull: No acute fracture or focal lesion. Other: None. CT MAXILLOFACIAL FINDINGS Osseous: No fracture or mandibular dislocation. No destructive process. Sinuses/Orbits: Paranasal sinuses and mastoid air cells are clear. Left lens replacement. Otherwise the orbits are unremarkable. Soft tissues: Right periorbital soft tissue defect. No retained radiopaque foreign body. CT CERVICAL SPINE FINDINGS Alignment: Mild retrolisthesis of C3 on C4. Grade 1 anterolisthesis of C4 on C5. Grade 1 anterolisthesis of C7 on T1. Skull base and vertebrae: Multilevel severe degenerative changes of the spine with associated  multilevel moderate severe osseous neural foraminal stenosis. No severe osseous central canal stenosis. No acute fracture. No aggressive appearing focal osseous lesion or focal pathologic process. Soft tissues and spinal canal: No prevertebral fluid or swelling. No visible canal hematoma. Upper chest: Unremarkable. Other: None. IMPRESSION: 1. No acute intracranial abnormality. 2. No acute displaced facial fracture. 3. No acute displaced fracture or traumatic listhesis of the cervical spine. Electronically Signed   By: Tish Frederickson M.D.   On: 08/01/2022 03:10  Assessment/Plan 1. Goals of care, counseling/discussion Discussed patient's goals of care as outlined above. Patient is alert and oriented and is declining medically recommended interventions at this time (changing the tube). Discussed with patient and her daughter, these actions are within her right to perform. Patient previously declined feeding tubes, IV fluids in the event she has dehydration. Contacted [facility] Attending at [facility] to discuss the case as she is alert, oriented and resolved that it is her time to die. He also agrees that we must respect her wishes and continue to support the family. If patient agrees, they are available for consultation. Plan to have a goals of care discussion with the family on Friday.    Family/ staff Communication: DON, nursing, Dicie Beam  Labs/tests ordered:  none   I spent greater than 1 hour for the care of this patient with >30 minutes in goals of care conversation with patient and family.

## 2022-08-27 ENCOUNTER — Encounter: Payer: Self-pay | Admitting: Student

## 2022-08-27 ENCOUNTER — Other Ambulatory Visit: Payer: Self-pay | Admitting: Student

## 2022-08-27 ENCOUNTER — Non-Acute Institutional Stay (SKILLED_NURSING_FACILITY): Payer: Medicare Other | Admitting: Student

## 2022-08-27 DIAGNOSIS — K82A1 Gangrene of gallbladder in cholecystitis: Secondary | ICD-10-CM

## 2022-08-27 DIAGNOSIS — Z66 Do not resuscitate: Secondary | ICD-10-CM

## 2022-08-27 DIAGNOSIS — Z7189 Other specified counseling: Secondary | ICD-10-CM

## 2022-08-27 NOTE — Progress Notes (Signed)
Refer to recent notes.

## 2022-08-27 NOTE — Progress Notes (Signed)
Location:  Other [place].  Nursing Home Room Number: [facility] [address] Place of Service:  SNF [phone]) Provider:  Earnestine Mealing, MD  Patient Care Team: Earnestine Mealing, MD as PCP - General (Family Medicine) Venancio Poisson, MD as Consulting Physician (Dermatology) Ernest Pine Illene Labrador, MD as Consulting Physician (Orthopedic Surgery) Sallee Lange, MD as Referring Physician (Ophthalmology) Fransico Michael, MD as Referring Physician (Audiology) Trellis Paganini, DMD as Referring Physician (Dentistry)  Extended Emergency Contact Information Primary Emergency Contact: Augusto Garbe of [place] Home Phone: [phone] Relation: Daughter Secondary Emergency Contact: Foster Simpson Mobile Phone: [phone] Relation: Daughter  Code Status:  DNR Goals of care: Advanced Directive information    08/27/2022    9:12 AM  Advanced Directives  Does Patient Have a Medical Advance Directive? Yes  Type of Estate agent of [place];Out of facility DNR (pink MOST or yellow form)  Does patient want to make changes to medical advance directive? No - Patient declined  Copy of Healthcare Power of Attorney in Chart? Yes - validated most recent copy scanned in chart (See row information)     Chief Complaint  Patient presents with   Acute Visit    Family Meeting.     HPI:  Pt is a 89 y.o. female seen today for an acute visit for follow up of previous goals of care discussion. Patient is less interactive at this time. Continues to have output in the ostomy. She awakes in her bed and asks that we leave her alone. I asked if she had a chance to speak to her daughters and she says, "Well they aren't here are they? Please leave me alone." She denies pain and declines medications at this time.   Advance Care Planning  Goals of care discussion regarding patient, Junerose Andreason, with patient's daughters, Montez Morita and Larita Fife. Present are Interior and spatial designer of Nursing, Cristy Hilts and Nursing Coordinator, Judie Grieve   This is a phone conversation. All parties were introduced. Discussed patient's current status. Started conversation giving space for daughters to give an update regarding Ms. Gupton's status. They state until their visit on last Wednesday, she was her normal self. Eating, drinking, and enjoying family. Since Monday she has been eating less and declined her visit to the hospital. They would like my input regarding where she is.   Discussed how over the last 6 months patient has had to have more frequent interventions for her tube. Each time, she has expressed to me her disdain for going back and for the to the hospital for the interventions, her daughters concurred this is the case. Discussed there is a time in patient's life where they have determined they no longer want interventions, and Ms. Hrabovsky has gotten to that point. She has been able to clearly state this to myself and other caregivers. Discussed that for a person who is alert, oriented, and is able to describe the risks and benefits of an intervention is able to make that decision for themself-- this is called self determination.   The family is requesting additional support for a second opinion as well as interactions with Ms. Tudor. At this time will write an order for Hospice. Family also requests that we go ahead an cap the tube, which will perform today. Discussed that this was not previously done because she adamantly demanded the tube and bag be left alone.   We spent >25 minutes on the phone discussing this patient's current status.  Past Medical History:  Diagnosis Date   Acute gangrenous cholecystitis 02/12/2020   Arthritis    Atypical nevi 09/2014   lentiginous dysplastic nevus with mod atypia ([place])   Basal cell carcinoma 08/12/2017   R distal dorsum nose    Cholecystitis with gangrene of gallbladder    Diverticulitis    DNR (do not resuscitate)    Esophageal stricture  2012   GERD (gastroesophageal reflux disease)    HLD (hyperlipidemia)    did not tolerate statin, stopped checking   HTN (hypertension)    Hypokalemia    Hypomagnesemia    Hyponatremia    Leg wound, left, subsequent encounter 11/12/2017   Malignant melanoma 2015, 2017   R chin s/p excision ([place]) R upper back [facility])   Malignant melanoma 07/27/19 WLE at [facility]   R lat deltoid   Multiple rib fractures 11/02/2017   Osteoporosis, post-menopausal    bisphosphonate caused dysphagia, requests defer DEXA for now   SCC (squamous cell carcinoma) 03/16/2022   R mid cheek, txt with 30f/u/calcipotriene   SCC (squamous cell carcinoma) 04/26/2022   L lower calf,EDC   SCC (squamous cell carcinoma) 04/26/2022   R ant ankle, EDC   Seasonal allergies    Sepsis 02/12/2020   Skin lesion 08/17/2018   Squamous cell carcinoma of skin 08/21/2018   L med leg below knee   Squamous cell carcinoma of skin 01/20/2017   R pretibial lateral   Squamous cell carcinoma of skin 09/25/2020   Left med lower leg above ankle - EDC   Squamous cell carcinoma of skin 09/09/2021   L ankle, EDC   Wears hearing aid    Past Surgical History:  Procedure Laterality Date   BUNIONECTOMY Right 2001   CARPAL TUNNEL RELEASE Bilateral 1974   CATARACT EXTRACTION Bilateral    [place]   IR CHOLANGIOGRAM EXISTING TUBE  04/04/2020   IR CHOLANGIOGRAM EXISTING TUBE  01/19/2021   IR EXCHANGE BILIARY DRAIN  02/26/2021   IR EXCHANGE BILIARY DRAIN  09/01/2021   IR EXCHANGE BILIARY DRAIN  12/04/2021   IR EXCHANGE BILIARY DRAIN  02/24/2022   IR EXCHANGE BILIARY DRAIN  05/26/2022   IR EXCHANGE BILIARY DRAIN  08/12/2022   IR EXCHANGE BILIARY DRAIN  08/17/2022   IR RADIOLOGIST EVAL & MGMT  01/22/2021   IR REMOVAL OF CALCULI/DEBRIS BILIARY DUCT/GB  02/26/2021   TONSILLECTOMY  1928    Allergies  Allergen Reactions   Other Rash and Other (See Comments)    Silk Suture - redness and severe pain   Bisphosphonates Other (See Comments)     Dysphagia to oral bisphosphonates   Codeine Nausea And Vomiting   Statins Other (See Comments)    Muscle aches   Sulfa Antibiotics     Can't recall reaction   Procaine Hcl Palpitations    Outpatient Encounter Medications as of 08/27/2022  Medication Sig   acetaminophen (TYLENOL) 500 MG tablet Take 500 mg by mouth every 4 (four) hours as needed for mild pain.   ammonium lactate (AMLACTIN) 12 % cream Apply 1 Application topically 2 (two) times daily.   bisacodyl (DULCOLAX) 10 MG suppository Place 10 mg rectally daily as needed for moderate constipation.   carbamide peroxide (DEBROX) 6.5 % OTIC solution Place 5 drops into both ears as needed.   ciprofloxacin (CIPRO) 500 MG tablet Take 500 mg by mouth daily.   Emollient (GOLD BOND ULT ROUGH/BUMPY SKIN) CREA Apply 1 Application topically as needed. Trunk and extremities, see other listing  magnesium hydroxide (MILK OF MAGNESIA) 400 MG/5ML suspension Take 30 mLs by mouth daily as needed for mild constipation.   Menthol, Topical Analgesic, (EUCERIN ITCH RELIEF) 0.1 % LOTN Apply 1 Application topically every 8 (eight) hours as needed (For itching).   metroNIDAZOLE (FLAGYL) 500 MG tablet Take 500 mg by mouth 2 (two) times daily.   nepafenac (ILEVRO) 0.3 % ophthalmic suspension Place 1 drop into the left eye daily.    ondansetron (ZOFRAN) 4 MG tablet Take 4 mg by mouth every 8 (eight) hours as needed for nausea or vomiting.   polyethylene glycol (MIRALAX / GLYCOLAX) 17 g packet Take 17 g by mouth daily as needed for moderate constipation.   Pramoxine-Menthol (GOLD BOND RAPID RELIEF) 1-1 % CREA Apply to feet, toes and other affected areas as needed for itch.   prednisoLONE acetate (PRED MILD) 0.12 % ophthalmic suspension Place 1 drop into the left eye daily.    Propylene Glycol (SYSTANE COMPLETE OP) Place 1 drop into the left eye every 8 (eight) hours as needed (dry eyes).   saccharomyces boulardii (FLORASTOR) 250 MG capsule Take 1 capsule (250 mg  total) by mouth 2 (two) times daily for 13 days.   Vitamin D, Ergocalciferol, (DRISDOL) 1.25 MG (50000 UNIT) CAPS capsule Take 50,000 Units by mouth every 30 (thirty) days. Give on the 4th of every month.   [DISCONTINUED] ammonium lactate (AMLACTIN) 12 % cream Apply 1 Application topically as needed for dry skin. (Patient taking differently: Apply 1 Application topically 2 (two) times daily.)   No facility-administered encounter medications on file as of 08/27/2022.    Review of Systems  Immunization History  Administered Date(s) Administered   Fluad Quad(high Dose 65+) 03/22/2019   Influenza, High Dose Seasonal PF 03/09/2022   Influenza,inj,Quad PF,6+ Mos 03/27/2013, 02/08/2017, 02/09/2018   Influenza-Unspecified 02/21/2014   Moderna Covid-19 Vaccine Bivalent Booster 17yrs & up 02/13/2021   Moderna SARS-COV2 Booster Vaccination 08/20/2020   Moderna Sars-Covid-2 Vaccination 06/05/2019, 07/03/2019, 04/02/2022   Pfizer Covid-19 Vaccine Bivalent Booster 52yrs & up 02/13/2021   Pneumococcal Polysaccharide-23 03/20/2012   Tdap 08/01/2022   Pertinent  Health Maintenance Due  Topic Date Due   INFLUENZA VACCINE  12/23/2022   DEXA SCAN  Completed      02/15/2020    9:00 AM 02/15/2020    9:49 AM 04/03/2020    9:55 AM 04/04/2020   10:58 AM 02/26/2021    7:29 AM  Fall Risk  Falls in the past year?   0    Was there an injury with Fall?   0    Fall Risk Category Calculator   0    Fall Risk Category (Retired)   Low    (RETIRED) Patient Fall Risk Level High fall risk High fall risk High fall risk High fall risk Moderate fall risk   Functional Status Survey:    Vitals:   08/27/22 0905  BP: 104/65  Pulse: 70  Resp: 18  Temp: (!) 97.5 F (36.4 C)  SpO2: 92%  Weight: 126 lb (57.2 kg)  Height: 5\' 3"  (1.6 m)   Body mass index is 22.32 kg/m. Physical Exam Constitutional:      Comments: Patient is sleeping comfotably in bed.   Abdominal:     Palpations: Abdomen is soft.     Comments:  500 mL of green fluid in ostomy bag. Ostomy bag removed and capped.   Skin:    General: Skin is warm and dry.  Neurological:     General: No  focal deficit present.     Labs reviewed: Recent Labs    07/08/22 0000 08/01/22 0120 08/19/22 0000  NA 139 138 134*  K 4.1 3.5 3.8  CL 106 105 95*  CO2 26* 23 27*  GLUCOSE  --  122*  --   BUN 18 19 31*  CREATININE 0.8 0.86 1.1  CALCIUM 9.4 9.4 9.6   Recent Labs    07/08/22 0000 08/01/22 0120 08/19/22 0000  AST 14 19 14   ALT 4* 10 5*  ALKPHOS 63 71 69  BILITOT  --  0.7  --   PROT  --  7.6  --   ALBUMIN 4.0 4.1 4.3   Recent Labs    08/01/22 0120 08/19/22 0000 08/23/22 0000  WBC 7.2 17.1 12.7  NEUTROABS 4.6 14,826.00 10,300.00  HGB 12.0 12.1 12.6  HCT 37.3 36 37  MCV 98.4  --   --   PLT 239 303 410*   Lab Results  Component Value Date   TSH 2.721 10/11/2017   No results found for: "HGBA1C" Lab Results  Component Value Date   CHOL 271 (H) 09/17/2013   HDL 49.10 09/17/2013   LDLCALC 200 (H) 09/17/2013   TRIG 110.0 09/17/2013   CHOLHDL 6 09/17/2013    Significant Diagnostic Results in last 30 days:  IR EXCHANGE BILIARY DRAIN  Result Date: 08/17/2022 INDICATION: Previous history: 89 year old female with history of cholelithiasis and choledocholithiasis with chronic indwelling cholecystostomy drain status post choledochoscope-assisted percutaneous gallstone retrieval on 02/26/2021 which was largely successful. The patient has persistent indwelling cholecystostomy tube as she has previously stated she does not want any further procedures done and does not mind having the tube in place. Current history: Over the past several months, the patient has now re-presented to the [facility] hospital with complaints of dislodged or leaking cholecystostomy tube. The patient has reported significant discomfort with cholecystostomy tube exchanges. She now presents today with a dislodged cholecystostomy tube. I had a lengthy discussion with  patient's daughter about further management. It would appear that she had a previously successful procedure with Dr. Elby Showers to percutaneously remove her gallstones. However, due to a combination of what appear to be a persistent gallstone in the common bile duct and her advanced age, a capping trial was not attempted and therefore she had a chronic cholecystostomy tube. I discussed with the daughter that her cystic duct has appear patent on multiple previous examinations, and that if the common bile duct stone could be removed, she may be able to be to tube free given her the extremely negative impact the cholecystostomy tube has had on her the past year. The patient's daughter was agreeable for me to proceed with cholecystostomy tube replacement and possible stone removal, followed by possible future capping trial. EXAM: 1. Exchange of percutaneous cholecystostomy tube for a percutaneous biliary drainage catheter using fluoroscopic guidance 2. Percutaneous choledocholith extraction using fluoroscopic guidance MEDICATIONS: None ANESTHESIA/SEDATION: Local analgesia FLUOROSCOPY: Radiation Exposure Index (as provided by the fluoroscopic device): 7.0 minutes (50 mGy) COMPLICATIONS: None immediate. PROCEDURE: Informed written consent was obtained from the patient after a thorough discussion of the procedural risks, benefits and alternatives. All questions were addressed. Maximal Sterile Barrier Technique was utilized including caps, mask, sterile gowns, sterile gloves, sterile drape, hand hygiene and skin antiseptic. A timeout was performed prior to the initiation of the procedure. Patient was placed supine on the exam table. The right upper quadrant was prepped and draped in the standard sterile fashion with the inclusion of  the old percutaneous access site within the sterile field. An 035 catheter was advanced through the percutaneous defect, and gentle hand injection of contrast material was performed. This  demonstrated immediate opacification of the gallbladder lumen and cystic duct. Given previous discussion with the patient's daughter, the decision was made to proceed with attempted catheterization of the common bile duct through this access and stone retrieval. Using combination of the 035 angled catheter and Bentson guidewire, wire and catheter were advanced into the common bile duct. Contrast injection of the common bile duct was performed, confirming location. A small circular filling defect was identified, compatible with the known choledocholith. Wire and catheter were then advanced into the proximal small bowel through the ampulla. Location was again confirmed with injection of contrast material. Wire was then exchanged for an Amplatz wire, followed by a short 6 French sheath advanced into the cystic duct. Then, a 5 [place] Fogarty balloon was advanced into the proximal common bile duct, inflated to its nominal size, and then advanced through the ampulla pushing the small gallstone out. Repeat injection of the common bile duct through the sheath demonstrated no further filling defects identified. However, no contrast material was seen coursing through the ampulla, which may have been secondary to trauma caused by pushing the gallstone and Fogarty balloon through the ampulla. Therefore, the decision was made to leave a biliary drainage catheter in place through the ampulla to ensure adequate drainage until healing occurs. Over the Amplatz wire, a 12 [place] biliary drainage catheter was advanced, such as the terminal loop was located in the proximal small bowel. Injection of contrast material confirmed appropriate drainage of the common bile duct. The drainage catheter was then secured to the skin using a dressing, and attached to bag drainage. The patient tolerated the procedure well without immediate complication. IMPRESSION: 1. Successful percutaneous stone extraction of the known choledocholith in the common  bile duct. 2. Successful exchange/replacement of the patient's cholecystostomy tube for a new percutaneous biliary drainage catheter via existing access. Biliary drainage catheter was left across the ampulla to allow for healing secondary to presumed trauma following stone extraction. 3. Patient to return to Interventional Radiology in 1 week for repeat sheath cholangiogram and possible safety catheter placement/capping trial. Assuming the ampulla is patent, the patient may return the following week for lab check and safety catheter/tube removal. Electronically Signed   By: Olive BassYasser  El-Abd M.D.   On: 08/17/2022 16:17   IR EXCHANGE BILIARY DRAIN  Result Date: 08/12/2022 INDICATION: Malfunctioning cholecystostomy tube, pain in leakage EXAM: Exchange of percutaneous cholecystostomy tube using fluoroscopic guidance MEDICATIONS: None ANESTHESIA/SEDATION: Local analgesia FLUOROSCOPY: Radiation Exposure Index (as provided by the fluoroscopic device): 2.5 minutes (13 mGy) COMPLICATIONS: None immediate. PROCEDURE: Informed written consent was obtained from the patient after a thorough discussion of the procedural risks, benefits and alternatives. All questions were addressed. Maximal Sterile Barrier Technique was utilized including caps, mask, sterile gowns, sterile gloves, sterile drape, hand hygiene and skin antiseptic. A timeout was performed prior to the initiation of the procedure. Patient was placed supine on the exam table. The right upper quadrant was prepped and draped in the standard sterile fashion with inclusion of the existing cholecystostomy tube within the sterile field. Initial injection of the existing cholecystostomy tube demonstrated that it was retracted and partially in the gallbladder. While maintaining the locking loop, access into the gallbladder was obtained using a stiff angled Glidewire. Location was confirmed with looping of the wire in the gallbladder lumen. The locking loop  was released, and  over this wire the existing cholecystostomy tube was exchanged for a new, similar 14 French locking cholecystostomy tube. Locking loop was formed. Location of the gallbladder was confirmed with injection of contrast material. The drainage catheter was secured to the skin using a dressing. It was attached to bag drainage. Patient tolerated the procedure well without immediate complication. IMPRESSION: Successful exchange and repositioning of the patient's percutaneous cholecystostomy tube for a new, similar 14 French locking drainage catheter. Drainage catheter placed to bag drainage. The patient may return to Interventional Radiology in 4-6 months for routine exchange. Electronically Signed   By: Olive Bass M.D.   On: 08/12/2022 10:33   DG Chest 2 View  Result Date: 08/01/2022 CLINICAL DATA:  Mechanical fall. EXAM: CHEST - 2 VIEW COMPARISON:  02/12/2020 FINDINGS: Stable cardiomediastinal silhouette. Aortic atherosclerotic calcification. Chronic bronchitic change and bibasilar scarring/atelectasis. No focal consolidation, pleural effusion, or pneumothorax. Remote left rib fractures. No evidence of acute rib fracture. Demineralization. IMPRESSION: No acute cardiopulmonary process. Electronically Signed   By: Minerva Fester M.D.   On: 08/01/2022 03:14   CT HEAD WO CONTRAST ( )  Result Date: 08/01/2022 CLINICAL DATA:  Head trauma, minor (Age >= 65y); Facial trauma, blunt; Neck trauma (Age >= 65y) EXAM: CT HEAD WITHOUT CONTRAST CT MAXILLOFACIAL WITHOUT CONTRAST CT CERVICAL SPINE WITHOUT CONTRAST TECHNIQUE: Multidetector CT imaging of the head, cervical spine, and maxillofacial structures were performed using the standard protocol without intravenous contrast. Multiplanar CT image reconstructions of the cervical spine and maxillofacial structures were also generated. RADIATION DOSE REDUCTION: This exam was performed according to the departmental dose-optimization program which includes automated exposure  control, adjustment of the mA and/or kV according to patient size and/or use of iterative reconstruction technique. COMPARISON:  None Available. FINDINGS: CT HEAD FINDINGS Brain: Patchy and confluent areas of decreased attenuation are noted throughout the deep and periventricular white matter of the cerebral hemispheres bilaterally, compatible with chronic microvascular ischemic disease. No evidence of large-territorial acute infarction. No parenchymal hemorrhage. No mass lesion. No extra-axial collection. No mass effect or midline shift. No hydrocephalus. Basilar cisterns are patent. Vascular: No hyperdense vessel. Atherosclerotic calcifications are present within the cavernous internal carotid arteries. Skull: No acute fracture or focal lesion. Other: None. CT MAXILLOFACIAL FINDINGS Osseous: No fracture or mandibular dislocation. No destructive process. Sinuses/Orbits: Paranasal sinuses and mastoid air cells are clear. Left lens replacement. Otherwise the orbits are unremarkable. Soft tissues: Right periorbital soft tissue defect. No retained radiopaque foreign body. CT CERVICAL SPINE FINDINGS Alignment: Mild retrolisthesis of C3 on C4. Grade 1 anterolisthesis of C4 on C5. Grade 1 anterolisthesis of C7 on T1. Skull base and vertebrae: Multilevel severe degenerative changes of the spine with associated multilevel moderate severe osseous neural foraminal stenosis. No severe osseous central canal stenosis. No acute fracture. No aggressive appearing focal osseous lesion or focal pathologic process. Soft tissues and spinal canal: No prevertebral fluid or swelling. No visible canal hematoma. Upper chest: Unremarkable. Other: None. IMPRESSION: 1. No acute intracranial abnormality. 2. No acute displaced facial fracture. 3. No acute displaced fracture or traumatic listhesis of the cervical spine. Electronically Signed   By: Tish Frederickson M.D.   On: 08/01/2022 03:10   CT Cervical Spine Wo Contrast  Result Date:  08/01/2022 CLINICAL DATA:  Head trauma, minor (Age >= 65y); Facial trauma, blunt; Neck trauma (Age >= 65y) EXAM: CT HEAD WITHOUT CONTRAST CT MAXILLOFACIAL WITHOUT CONTRAST CT CERVICAL SPINE WITHOUT CONTRAST TECHNIQUE: Multidetector CT imaging of the head, cervical spine,  and maxillofacial structures were performed using the standard protocol without intravenous contrast. Multiplanar CT image reconstructions of the cervical spine and maxillofacial structures were also generated. RADIATION DOSE REDUCTION: This exam was performed according to the departmental dose-optimization program which includes automated exposure control, adjustment of the mA and/or kV according to patient size and/or use of iterative reconstruction technique. COMPARISON:  None Available. FINDINGS: CT HEAD FINDINGS Brain: Patchy and confluent areas of decreased attenuation are noted throughout the deep and periventricular white matter of the cerebral hemispheres bilaterally, compatible with chronic microvascular ischemic disease. No evidence of large-territorial acute infarction. No parenchymal hemorrhage. No mass lesion. No extra-axial collection. No mass effect or midline shift. No hydrocephalus. Basilar cisterns are patent. Vascular: No hyperdense vessel. Atherosclerotic calcifications are present within the cavernous internal carotid arteries. Skull: No acute fracture or focal lesion. Other: None. CT MAXILLOFACIAL FINDINGS Osseous: No fracture or mandibular dislocation. No destructive process. Sinuses/Orbits: Paranasal sinuses and mastoid air cells are clear. Left lens replacement. Otherwise the orbits are unremarkable. Soft tissues: Right periorbital soft tissue defect. No retained radiopaque foreign body. CT CERVICAL SPINE FINDINGS Alignment: Mild retrolisthesis of C3 on C4. Grade 1 anterolisthesis of C4 on C5. Grade 1 anterolisthesis of C7 on T1. Skull base and vertebrae: Multilevel severe degenerative changes of the spine with associated  multilevel moderate severe osseous neural foraminal stenosis. No severe osseous central canal stenosis. No acute fracture. No aggressive appearing focal osseous lesion or focal pathologic process. Soft tissues and spinal canal: No prevertebral fluid or swelling. No visible canal hematoma. Upper chest: Unremarkable. Other: None. IMPRESSION: 1. No acute intracranial abnormality. 2. No acute displaced facial fracture. 3. No acute displaced fracture or traumatic listhesis of the cervical spine. Electronically Signed   By: Tish FredericksonMorgane  Naveau M.D.   On: 08/01/2022 03:10   CT Maxillofacial Wo Contrast  Result Date: 08/01/2022 CLINICAL DATA:  Head trauma, minor (Age >= 65y); Facial trauma, blunt; Neck trauma (Age >= 65y) EXAM: CT HEAD WITHOUT CONTRAST CT MAXILLOFACIAL WITHOUT CONTRAST CT CERVICAL SPINE WITHOUT CONTRAST TECHNIQUE: Multidetector CT imaging of the head, cervical spine, and maxillofacial structures were performed using the standard protocol without intravenous contrast. Multiplanar CT image reconstructions of the cervical spine and maxillofacial structures were also generated. RADIATION DOSE REDUCTION: This exam was performed according to the departmental dose-optimization program which includes automated exposure control, adjustment of the mA and/or kV according to patient size and/or use of iterative reconstruction technique. COMPARISON:  None Available. FINDINGS: CT HEAD FINDINGS Brain: Patchy and confluent areas of decreased attenuation are noted throughout the deep and periventricular white matter of the cerebral hemispheres bilaterally, compatible with chronic microvascular ischemic disease. No evidence of large-territorial acute infarction. No parenchymal hemorrhage. No mass lesion. No extra-axial collection. No mass effect or midline shift. No hydrocephalus. Basilar cisterns are patent. Vascular: No hyperdense vessel. Atherosclerotic calcifications are present within the cavernous internal carotid  arteries. Skull: No acute fracture or focal lesion. Other: None. CT MAXILLOFACIAL FINDINGS Osseous: No fracture or mandibular dislocation. No destructive process. Sinuses/Orbits: Paranasal sinuses and mastoid air cells are clear. Left lens replacement. Otherwise the orbits are unremarkable. Soft tissues: Right periorbital soft tissue defect. No retained radiopaque foreign body. CT CERVICAL SPINE FINDINGS Alignment: Mild retrolisthesis of C3 on C4. Grade 1 anterolisthesis of C4 on C5. Grade 1 anterolisthesis of C7 on T1. Skull base and vertebrae: Multilevel severe degenerative changes of the spine with associated multilevel moderate severe osseous neural foraminal stenosis. No severe osseous central canal stenosis. No acute  fracture. No aggressive appearing focal osseous lesion or focal pathologic process. Soft tissues and spinal canal: No prevertebral fluid or swelling. No visible canal hematoma. Upper chest: Unremarkable. Other: None. IMPRESSION: 1. No acute intracranial abnormality. 2. No acute displaced facial fracture. 3. No acute displaced fracture or traumatic listhesis of the cervical spine. Electronically Signed   By: Tish Frederickson M.D.   On: 08/01/2022 03:10    Assessment/Plan Goals of care, counseling/discussion Patient has continued to have decline and is becoming increasingly dehydrated likely due to the ostomy tube. Believe that capping the tube would be aligned with her goals of comfort based on the extent it is potentially contributing to dehydration. During previous conversation with Dr. Juliette Alcide, it is unclear exactly how the capping trial will goa with regards to her poor PO intake and status, but it is worth a try. Discussed with family the importance of their presence and support of their mother's wishes at this time. Hospice Consult as outlined above and hospice attending stated they are available for support today as well. She has a DNR in place.   Family/ staff Communication: DON,  Nursing, Lacinda Axon, Dr. Jamie Brookes.   Labs/tests ordered:  none   I spent greater than 75 minutes for the care of this patient with >16 minutes discussing goals of care over the phone and the remainder of the time with care planning and coordination, as well as face to face time with the patient.

## 2022-08-30 ENCOUNTER — Non-Acute Institutional Stay (SKILLED_NURSING_FACILITY): Admitting: Student

## 2022-08-30 ENCOUNTER — Other Ambulatory Visit: Payer: Self-pay | Admitting: Student

## 2022-08-30 ENCOUNTER — Encounter: Payer: Self-pay | Admitting: Student

## 2022-08-30 DIAGNOSIS — Z515 Encounter for palliative care: Secondary | ICD-10-CM

## 2022-08-30 DIAGNOSIS — Z8719 Personal history of other diseases of the digestive system: Secondary | ICD-10-CM

## 2022-08-30 DIAGNOSIS — K82A1 Gangrene of gallbladder in cholecystitis: Secondary | ICD-10-CM

## 2022-08-30 MED ORDER — LORAZEPAM 0.5 MG PO TABS: 0.5000 mg | ORAL_TABLET | Freq: Three times a day (TID) | ORAL | 0 refills | Status: DC | PRN

## 2022-08-30 MED ORDER — MORPHINE SULFATE (CONCENTRATE) 20 MG/ML PO SOLN: 5.0000 mg | ORAL | 0 refills | Status: DC | PRN

## 2022-08-30 NOTE — Progress Notes (Signed)
Patient in transitional phase of dying. Ordering  comfort meds.

## 2022-08-30 NOTE — Progress Notes (Signed)
Location:  Other [place].  Nursing Home Room Number: [facility] [address]A Place of Service:  SNF [phone]) Provider:  Earnestine Mealing, MD  Patient Care Team: Earnestine Mealing, MD as PCP - General (Family Medicine) Venancio Poisson, MD as Consulting Physician (Dermatology) Ernest Pine Illene Labrador, MD as Consulting Physician (Orthopedic Surgery) Sallee Lange, MD as Referring Physician (Ophthalmology) Fransico Michael, MD as Referring Physician (Audiology) Trellis Paganini, DMD as Referring Physician (Dentistry)  Extended Emergency Contact Information Primary Emergency Contact: Augusto Garbe of [place] Home Phone: [phone] Relation: Daughter Secondary Emergency Contact: Foster Simpson Mobile Phone: [phone] Relation: Daughter  Code Status:  DNR Goals of care: Advanced Directive information    08/30/2022   12:59 PM  Advanced Directives  Does Patient Have a Medical Advance Directive? Yes  Type of Estate agent of [place];Out of facility DNR (pink MOST or yellow form)  Does patient want to make changes to medical advance directive? No - Patient declined  Copy of Healthcare Power of Attorney in Chart? Yes - validated most recent copy scanned in chart (See row information)     Chief Complaint  Patient presents with   Acute Visit    Hospice Care    HPI:  Pt is a 89 y.o. female seen today for an acute visit for concern of transitioning.   Patient is resting in bed. She says, "Please let me go." "Tums." And when asked if she has pain, she says "All over." Spoke with nursing that she is due for next dose of pain medication which improved her pain.   Discussed with hospice nurse-- patient's BP droped to 67/38. She is cold, with mottling on her feet.   Spoke with her daughter, Montez Morita discussing concern that patient is now transitioning and perhaps has the next 24 hours  before she will expire. Explained changes in circulation are evident on  exam. She asks for updates regarding her status.    Past Medical History:  Diagnosis Date   Acute gangrenous cholecystitis 02/12/2020   Arthritis    Atypical nevi 09/2014   lentiginous dysplastic nevus with mod atypia ([place])   Basal cell carcinoma 08/12/2017   R distal dorsum nose    Cholecystitis with gangrene of gallbladder    Diverticulitis    DNR (do not resuscitate)    Esophageal stricture 2012   GERD (gastroesophageal reflux disease)    HLD (hyperlipidemia)    did not tolerate statin, stopped checking   HTN (hypertension)    Hypokalemia    Hypomagnesemia    Hyponatremia    Leg wound, left, subsequent encounter 11/12/2017   Malignant melanoma 2015, 2017   R chin s/p excision ([place]) R upper back [place])   Malignant melanoma 07/27/19 [facility]   R lat deltoid   Multiple rib fractures 11/02/2017   Osteoporosis, post-menopausal    bisphosphonate caused dysphagia, requests defer DEXA for now   SCC (squamous cell carcinoma) 03/16/2022   R mid cheek, txt with 23f/u/calcipotriene   SCC (squamous cell carcinoma) 04/26/2022   L lower calf,EDC   SCC (squamous cell carcinoma) 04/26/2022   R ant ankle, EDC   Seasonal allergies    Sepsis 02/12/2020   Skin lesion 08/17/2018   Squamous cell carcinoma of skin 08/21/2018   L med leg below knee   Squamous cell carcinoma of skin 01/20/2017   R pretibial lateral   Squamous cell carcinoma of skin 09/25/2020   Left med lower leg above ankle - [facility]  Squamous cell carcinoma of skin 09/09/2021   L ankle, EDC   Wears hearing aid    Past Surgical History:  Procedure Laterality Date   BUNIONECTOMY Right 2001   CARPAL TUNNEL RELEASE Bilateral 1974   CATARACT EXTRACTION Bilateral    [place]   IR CHOLANGIOGRAM EXISTING TUBE  04/04/2020   IR CHOLANGIOGRAM EXISTING TUBE  01/19/2021   IR EXCHANGE BILIARY DRAIN  02/26/2021   IR EXCHANGE BILIARY DRAIN  09/01/2021   IR EXCHANGE BILIARY DRAIN  12/04/2021   IR EXCHANGE BILIARY DRAIN   02/24/2022   IR EXCHANGE BILIARY DRAIN  05/26/2022   IR EXCHANGE BILIARY DRAIN  08/12/2022   IR EXCHANGE BILIARY DRAIN  08/17/2022   IR RADIOLOGIST EVAL & MGMT  01/22/2021   IR REMOVAL OF CALCULI/DEBRIS BILIARY DUCT/GB  02/26/2021   TONSILLECTOMY  1928    Allergies  Allergen Reactions   Other Rash and Other (See Comments)    Silk Suture - redness and severe pain   Bisphosphonates Other (See Comments)    Dysphagia to oral bisphosphonates   Codeine Nausea And Vomiting   Statins Other (See Comments)    Muscle aches   Sulfa Antibiotics     Can't recall reaction   Procaine Hcl Palpitations    Outpatient Encounter Medications as of 08/30/2022  Medication Sig   acetaminophen (TYLENOL) 500 MG tablet Take 500 mg by mouth every 4 (four) hours as needed for mild pain.   ammonium lactate (AMLACTIN) 12 % cream Apply 1 Application topically 2 (two) times daily.   bisacodyl (DULCOLAX) 10 MG suppository Place 10 mg rectally daily as needed for moderate constipation.   carbamide peroxide (DEBROX) 6.5 % OTIC solution Place 5 drops into both ears as needed.   Emollient (GOLD BOND ULT ROUGH/BUMPY SKIN) CREA Apply 1 Application topically as needed. Trunk and extremities, see other listing   LORazepam (ATIVAN) 0.5 MG tablet Take 0.5 mg by mouth every 4 (four) hours as needed for anxiety.   magnesium hydroxide (MILK OF MAGNESIA) 400 MG/5ML suspension Take 30 mLs by mouth daily as needed for mild constipation.   Menthol, Topical Analgesic, (EUCERIN ITCH RELIEF) 0.1 % LOTN Apply 1 Application topically every 8 (eight) hours as needed (For itching).   Morphine Sulfate (MORPHINE CONCENTRATE) 10 mg / 0.5 ml concentrated solution Take 0.25 mLs by mouth every 4 (four) hours as needed for severe pain.   nepafenac (ILEVRO) 0.3 % ophthalmic suspension Place 1 drop into the left eye daily.    ondansetron (ZOFRAN) 4 MG tablet Take 4 mg by mouth every 8 (eight) hours as needed for nausea or vomiting.   polyethylene glycol  (MIRALAX / GLYCOLAX) 17 g packet Take 17 g by mouth daily as needed for moderate constipation.   Pramoxine-Menthol (GOLD BOND RAPID RELIEF) 1-1 % CREA Apply to feet, toes and other affected areas as needed for itch.   prednisoLONE acetate (PRED MILD) 0.12 % ophthalmic suspension Place 1 drop into the left eye daily.    Propylene Glycol (SYSTANE COMPLETE OP) Place 1 drop into the left eye every 8 (eight) hours as needed (dry eyes).   saccharomyces boulardii (FLORASTOR) 250 MG capsule Take 1 capsule (250 mg total) by mouth 2 (two) times daily for 13 days.   Vitamin D, Ergocalciferol, (DRISDOL) 1.25 MG (50000 UNIT) CAPS capsule Take 50,000 Units by mouth every 30 (thirty) days. Give on the 4th of every month.   [DISCONTINUED] morphine (ROXANOL) 20 MG/ML concentrated solution Take 0.25 mLs (5 mg total)  by mouth every hour as needed for severe pain. (Patient taking differently: Take 5 mg by mouth every 4 (four) hours as needed for severe pain.)   [DISCONTINUED] ciprofloxacin (CIPRO) 500 MG tablet Take 500 mg by mouth daily.   [DISCONTINUED] LORazepam (ATIVAN) 0.5 MG tablet Take 1 tablet (0.5 mg total) by mouth every 8 (eight) hours as needed for anxiety. (Patient taking differently: Take 0.5 mg by mouth every 4 (four) hours as needed for anxiety.)   [DISCONTINUED] metroNIDAZOLE (FLAGYL) 500 MG tablet Take 500 mg by mouth 2 (two) times daily.   No facility-administered encounter medications on file as of 08/30/2022.    Review of Systems  Immunization History  Administered Date(s) Administered   Fluad Quad(high Dose 65+) 03/22/2019   Influenza, High Dose Seasonal PF 03/09/2022   Influenza,inj,Quad PF,6+ Mos 03/27/2013, 02/08/2017, 02/09/2018   Influenza-Unspecified 02/21/2014   Moderna Covid-19 Vaccine Bivalent Booster 71yrs & up 02/13/2021   Moderna SARS-COV2 Booster Vaccination 08/20/2020   Moderna Sars-Covid-2 Vaccination 06/05/2019, 07/03/2019, 04/02/2022   Pfizer Covid-19 Vaccine Bivalent  Booster 69yrs & up 02/13/2021   Pneumococcal Polysaccharide-23 03/20/2012   Tdap 08/01/2022   Pertinent  Health Maintenance Due  Topic Date Due   INFLUENZA VACCINE  12/23/2022   DEXA SCAN  Completed      02/15/2020    9:00 AM 02/15/2020    9:49 AM 04/03/2020    9:55 AM 04/04/2020   10:58 AM 02/26/2021    7:29 AM  Fall Risk  Falls in the past year?   0    Was there an injury with Fall?   0    Fall Risk Category Calculator   0    Fall Risk Category (Retired)   Low    (RETIRED) Patient Fall Risk Level High fall risk High fall risk High fall risk High fall risk Moderate fall risk   Functional Status Survey:    Vitals:   08/30/22 1229  BP: 104/65  Pulse: 70  Resp: 18  Temp: (!) 97.5 F (36.4 C)  SpO2: 92%  Weight: 126 lb (57.2 kg)  Height: 5\' 3"  (1.6 m)   Body mass index is 22.32 kg/m. Physical Exam Constitutional:      Comments: Sleeping in bed, arousable  Pulmonary:     Comments: Normal work of breathing, apnea for 3-5 seconds Abdominal:     Tenderness: There is abdominal tenderness.     Comments: Hypoactive bowel sounds  Skin:    Comments: Cold and dry, mottled purple in color. perioral cyanosis, acrocyanosis     Labs reviewed: Recent Labs    07/08/22 0000 08/01/22 0120 08/19/22 0000  NA 139 138 134*  K 4.1 3.5 3.8  CL 106 105 95*  CO2 26* 23 27*  GLUCOSE  --  122*  --   BUN 18 19 31*  CREATININE 0.8 0.86 1.1  CALCIUM 9.4 9.4 9.6   Recent Labs    07/08/22 0000 08/01/22 0120 08/19/22 0000  AST 14 19 14   ALT 4* 10 5*  ALKPHOS 63 71 69  BILITOT  --  0.7  --   PROT  --  7.6  --   ALBUMIN 4.0 4.1 4.3   Recent Labs    08/01/22 0120 08/19/22 0000 08/23/22 0000  WBC 7.2 17.1 12.7  NEUTROABS 4.6 14,826.00 10,300.00  HGB 12.0 12.1 12.6  HCT 37.3 36 37  MCV 98.4  --   --   PLT 239 303 410*   Lab Results  Component Value Date  TSH 2.721 10/11/2017   No results found for: "HGBA1C" Lab Results  Component Value Date   CHOL 271 (H)  09/17/2013   HDL 49.10 09/17/2013   LDLCALC 200 (H) 09/17/2013   TRIG 110.0 09/17/2013   CHOLHDL 6 09/17/2013    Significant Diagnostic Results in last 30 days:  IR EXCHANGE BILIARY DRAIN  Result Date: 08/17/2022 INDICATION: Previous history: 88 year old female with history of cholelithiasis and choledocholithiasis with chronic indwelling cholecystostomy drain status post choledochoscope-assisted percutaneous gallstone retrieval on 02/26/2021 which was largely successful. The patient has persistent indwelling cholecystostomy tube as she has previously stated she does not want any further procedures done and does not mind having the tube in place. Current history: Over the past several months, the patient has now re-presented to the [facility] hospital with complaints of dislodged or leaking cholecystostomy tube. The patient has reported significant discomfort with cholecystostomy tube exchanges. She now presents today with a dislodged cholecystostomy tube. I had a lengthy discussion with patient's daughter about further management. It would appear that she had a previously successful procedure with Dr. Elby Showers to percutaneously remove her gallstones. However, due to a combination of what appear to be a persistent gallstone in the common bile duct and her advanced age, a capping trial was not attempted and therefore she had a chronic cholecystostomy tube. I discussed with the daughter that her cystic duct has appear patent on multiple previous examinations, and that if the common bile duct stone could be removed, she may be able to be to tube free given her the extremely negative impact the cholecystostomy tube has had on her the past year. The patient's daughter was agreeable for me to proceed with cholecystostomy tube replacement and possible stone removal, followed by possible future capping trial. EXAM: 1. Exchange of percutaneous cholecystostomy tube for a percutaneous biliary drainage catheter using  fluoroscopic guidance 2. Percutaneous choledocholith extraction using fluoroscopic guidance MEDICATIONS: None ANESTHESIA/SEDATION: Local analgesia FLUOROSCOPY: Radiation Exposure Index (as provided by the fluoroscopic device): 7.0 minutes (50 mGy) COMPLICATIONS: None immediate. PROCEDURE: Informed written consent was obtained from the patient after a thorough discussion of the procedural risks, benefits and alternatives. All questions were addressed. Maximal Sterile Barrier Technique was utilized including caps, mask, sterile gowns, sterile gloves, sterile drape, hand hygiene and skin antiseptic. A timeout was performed prior to the initiation of the procedure. Patient was placed supine on the exam table. The right upper quadrant was prepped and draped in the standard sterile fashion with the inclusion of the old percutaneous access site within the sterile field. An 035 catheter was advanced through the percutaneous defect, and gentle hand injection of contrast material was performed. This demonstrated immediate opacification of the gallbladder lumen and cystic duct. Given previous discussion with the patient's daughter, the decision was made to proceed with attempted catheterization of the common bile duct through this access and stone retrieval. Using combination of the 035 angled catheter and Bentson guidewire, wire and catheter were advanced into the common bile duct. Contrast injection of the common bile duct was performed, confirming location. A small circular filling defect was identified, compatible with the known choledocholith. Wire and catheter were then advanced into the proximal small bowel through the ampulla. Location was again confirmed with injection of contrast material. Wire was then exchanged for an Amplatz wire, followed by a short 6 French sheath advanced into the cystic duct. Then, a 5 [place] Fogarty balloon was advanced into the proximal common bile duct, inflated to its nominal size, and  then advanced through the ampulla pushing the small gallstone out. Repeat injection of the common bile duct through the sheath demonstrated no further filling defects identified. However, no contrast material was seen coursing through the ampulla, which may have been secondary to trauma caused by pushing the gallstone and Fogarty balloon through the ampulla. Therefore, the decision was made to leave a biliary drainage catheter in place through the ampulla to ensure adequate drainage until healing occurs. Over the Amplatz wire, a 12 [place] biliary drainage catheter was advanced, such as the terminal loop was located in the proximal small bowel. Injection of contrast material confirmed appropriate drainage of the common bile duct. The drainage catheter was then secured to the skin using a dressing, and attached to bag drainage. The patient tolerated the procedure well without immediate complication. IMPRESSION: 1. Successful percutaneous stone extraction of the known choledocholith in the common bile duct. 2. Successful exchange/replacement of the patient's cholecystostomy tube for a new percutaneous biliary drainage catheter via existing access. Biliary drainage catheter was left across the ampulla to allow for healing secondary to presumed trauma following stone extraction. 3. Patient to return to Interventional Radiology in 1 week for repeat sheath cholangiogram and possible safety catheter placement/capping trial. Assuming the ampulla is patent, the patient may return the following week for lab check and safety catheter/tube removal. Electronically Signed   By: Olive Bass M.D.   On: 08/17/2022 16:17   IR EXCHANGE BILIARY DRAIN  Result Date: 08/12/2022 INDICATION: Malfunctioning cholecystostomy tube, pain in leakage EXAM: Exchange of percutaneous cholecystostomy tube using fluoroscopic guidance MEDICATIONS: None ANESTHESIA/SEDATION: Local analgesia FLUOROSCOPY: Radiation Exposure Index (as provided by the  fluoroscopic device): 2.5 minutes (13 mGy) COMPLICATIONS: None immediate. PROCEDURE: Informed written consent was obtained from the patient after a thorough discussion of the procedural risks, benefits and alternatives. All questions were addressed. Maximal Sterile Barrier Technique was utilized including caps, mask, sterile gowns, sterile gloves, sterile drape, hand hygiene and skin antiseptic. A timeout was performed prior to the initiation of the procedure. Patient was placed supine on the exam table. The right upper quadrant was prepped and draped in the standard sterile fashion with inclusion of the existing cholecystostomy tube within the sterile field. Initial injection of the existing cholecystostomy tube demonstrated that it was retracted and partially in the gallbladder. While maintaining the locking loop, access into the gallbladder was obtained using a stiff angled Glidewire. Location was confirmed with looping of the wire in the gallbladder lumen. The locking loop was released, and over this wire the existing cholecystostomy tube was exchanged for a new, similar 14 French locking cholecystostomy tube. Locking loop was formed. Location of the gallbladder was confirmed with injection of contrast material. The drainage catheter was secured to the skin using a dressing. It was attached to bag drainage. Patient tolerated the procedure well without immediate complication. IMPRESSION: Successful exchange and repositioning of the patient's percutaneous cholecystostomy tube for a new, similar 14 French locking drainage catheter. Drainage catheter placed to bag drainage. The patient may return to Interventional Radiology in 4-6 months for routine exchange. Electronically Signed   By: Olive Bass M.D.   On: 08/12/2022 10:33   DG Chest 2 View  Result Date: 08/01/2022 CLINICAL DATA:  Mechanical fall. EXAM: CHEST - 2 VIEW COMPARISON:  02/12/2020 FINDINGS: Stable cardiomediastinal silhouette. Aortic  atherosclerotic calcification. Chronic bronchitic change and bibasilar scarring/atelectasis. No focal consolidation, pleural effusion, or pneumothorax. Remote left rib fractures. No evidence of acute rib fracture. Demineralization. IMPRESSION: No acute  cardiopulmonary process. Electronically Signed   By: Minerva Fester M.D.   On: 08/01/2022 03:14   CT HEAD WO CONTRAST ( )  Result Date: 08/01/2022 CLINICAL DATA:  Head trauma, minor (Age >= 65y); Facial trauma, blunt; Neck trauma (Age >= 65y) EXAM: CT HEAD WITHOUT CONTRAST CT MAXILLOFACIAL WITHOUT CONTRAST CT CERVICAL SPINE WITHOUT CONTRAST TECHNIQUE: Multidetector CT imaging of the head, cervical spine, and maxillofacial structures were performed using the standard protocol without intravenous contrast. Multiplanar CT image reconstructions of the cervical spine and maxillofacial structures were also generated. RADIATION DOSE REDUCTION: This exam was performed according to the departmental dose-optimization program which includes automated exposure control, adjustment of the mA and/or kV according to patient size and/or use of iterative reconstruction technique. COMPARISON:  None Available. FINDINGS: CT HEAD FINDINGS Brain: Patchy and confluent areas of decreased attenuation are noted throughout the deep and periventricular white matter of the cerebral hemispheres bilaterally, compatible with chronic microvascular ischemic disease. No evidence of large-territorial acute infarction. No parenchymal hemorrhage. No mass lesion. No extra-axial collection. No mass effect or midline shift. No hydrocephalus. Basilar cisterns are patent. Vascular: No hyperdense vessel. Atherosclerotic calcifications are present within the cavernous internal carotid arteries. Skull: No acute fracture or focal lesion. Other: None. CT MAXILLOFACIAL FINDINGS Osseous: No fracture or mandibular dislocation. No destructive process. Sinuses/Orbits: Paranasal sinuses and mastoid air cells are  clear. Left lens replacement. Otherwise the orbits are unremarkable. Soft tissues: Right periorbital soft tissue defect. No retained radiopaque foreign body. CT CERVICAL SPINE FINDINGS Alignment: Mild retrolisthesis of C3 on C4. Grade 1 anterolisthesis of C4 on C5. Grade 1 anterolisthesis of C7 on T1. Skull base and vertebrae: Multilevel severe degenerative changes of the spine with associated multilevel moderate severe osseous neural foraminal stenosis. No severe osseous central canal stenosis. No acute fracture. No aggressive appearing focal osseous lesion or focal pathologic process. Soft tissues and spinal canal: No prevertebral fluid or swelling. No visible canal hematoma. Upper chest: Unremarkable. Other: None. IMPRESSION: 1. No acute intracranial abnormality. 2. No acute displaced facial fracture. 3. No acute displaced fracture or traumatic listhesis of the cervical spine. Electronically Signed   By: Tish Frederickson M.D.   On: 08/01/2022 03:10   CT Cervical Spine Wo Contrast  Result Date: 08/01/2022 CLINICAL DATA:  Head trauma, minor (Age >= 65y); Facial trauma, blunt; Neck trauma (Age >= 65y) EXAM: CT HEAD WITHOUT CONTRAST CT MAXILLOFACIAL WITHOUT CONTRAST CT CERVICAL SPINE WITHOUT CONTRAST TECHNIQUE: Multidetector CT imaging of the head, cervical spine, and maxillofacial structures were performed using the standard protocol without intravenous contrast. Multiplanar CT image reconstructions of the cervical spine and maxillofacial structures were also generated. RADIATION DOSE REDUCTION: This exam was performed according to the departmental dose-optimization program which includes automated exposure control, adjustment of the mA and/or kV according to patient size and/or use of iterative reconstruction technique. COMPARISON:  None Available. FINDINGS: CT HEAD FINDINGS Brain: Patchy and confluent areas of decreased attenuation are noted throughout the deep and periventricular white matter of the cerebral  hemispheres bilaterally, compatible with chronic microvascular ischemic disease. No evidence of large-territorial acute infarction. No parenchymal hemorrhage. No mass lesion. No extra-axial collection. No mass effect or midline shift. No hydrocephalus. Basilar cisterns are patent. Vascular: No hyperdense vessel. Atherosclerotic calcifications are present within the cavernous internal carotid arteries. Skull: No acute fracture or focal lesion. Other: None. CT MAXILLOFACIAL FINDINGS Osseous: No fracture or mandibular dislocation. No destructive process. Sinuses/Orbits: Paranasal sinuses and mastoid air cells are clear. Left lens replacement. Otherwise the  orbits are unremarkable. Soft tissues: Right periorbital soft tissue defect. No retained radiopaque foreign body. CT CERVICAL SPINE FINDINGS Alignment: Mild retrolisthesis of C3 on C4. Grade 1 anterolisthesis of C4 on C5. Grade 1 anterolisthesis of C7 on T1. Skull base and vertebrae: Multilevel severe degenerative changes of the spine with associated multilevel moderate severe osseous neural foraminal stenosis. No severe osseous central canal stenosis. No acute fracture. No aggressive appearing focal osseous lesion or focal pathologic process. Soft tissues and spinal canal: No prevertebral fluid or swelling. No visible canal hematoma. Upper chest: Unremarkable. Other: None. IMPRESSION: 1. No acute intracranial abnormality. 2. No acute displaced facial fracture. 3. No acute displaced fracture or traumatic listhesis of the cervical spine. Electronically Signed   By: Tish Frederickson M.D.   On: 08/01/2022 03:10   CT Maxillofacial Wo Contrast  Result Date: 08/01/2022 CLINICAL DATA:  Head trauma, minor (Age >= 65y); Facial trauma, blunt; Neck trauma (Age >= 65y) EXAM: CT HEAD WITHOUT CONTRAST CT MAXILLOFACIAL WITHOUT CONTRAST CT CERVICAL SPINE WITHOUT CONTRAST TECHNIQUE: Multidetector CT imaging of the head, cervical spine, and maxillofacial structures were performed  using the standard protocol without intravenous contrast. Multiplanar CT image reconstructions of the cervical spine and maxillofacial structures were also generated. RADIATION DOSE REDUCTION: This exam was performed according to the departmental dose-optimization program which includes automated exposure control, adjustment of the mA and/or kV according to patient size and/or use of iterative reconstruction technique. COMPARISON:  None Available. FINDINGS: CT HEAD FINDINGS Brain: Patchy and confluent areas of decreased attenuation are noted throughout the deep and periventricular white matter of the cerebral hemispheres bilaterally, compatible with chronic microvascular ischemic disease. No evidence of large-territorial acute infarction. No parenchymal hemorrhage. No mass lesion. No extra-axial collection. No mass effect or midline shift. No hydrocephalus. Basilar cisterns are patent. Vascular: No hyperdense vessel. Atherosclerotic calcifications are present within the cavernous internal carotid arteries. Skull: No acute fracture or focal lesion. Other: None. CT MAXILLOFACIAL FINDINGS Osseous: No fracture or mandibular dislocation. No destructive process. Sinuses/Orbits: Paranasal sinuses and mastoid air cells are clear. Left lens replacement. Otherwise the orbits are unremarkable. Soft tissues: Right periorbital soft tissue defect. No retained radiopaque foreign body. CT CERVICAL SPINE FINDINGS Alignment: Mild retrolisthesis of C3 on C4. Grade 1 anterolisthesis of C4 on C5. Grade 1 anterolisthesis of C7 on T1. Skull base and vertebrae: Multilevel severe degenerative changes of the spine with associated multilevel moderate severe osseous neural foraminal stenosis. No severe osseous central canal stenosis. No acute fracture. No aggressive appearing focal osseous lesion or focal pathologic process. Soft tissues and spinal canal: No prevertebral fluid or swelling. No visible canal hematoma. Upper chest: Unremarkable.  Other: None. IMPRESSION: 1. No acute intracranial abnormality. 2. No acute displaced facial fracture. 3. No acute displaced fracture or traumatic listhesis of the cervical spine. Electronically Signed   By: Tish Frederickson M.D.   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 Will order comfort measure medications. Family updated. Continue to monitor for indications to advance medications. Per report of hospice nurse, patient with systolic blood pressures in the 60s systolic, she is Bradycardic to the 50s and O2 is unable to be read on exam today. Given patient's status of hospice will not transfer to hospital or higher level sof care. No interventions at this time.    Family/ staff Communication: nursing, daughter, Yves Dill ordered:  none

## 2022-09-14 ENCOUNTER — Ambulatory Visit: Payer: TRICARE For Life (TFL) | Admitting: Dermatology

## 2022-09-21 ENCOUNTER — Ambulatory Visit: Payer: TRICARE For Life (TFL) | Admitting: Dermatology

## 2022-09-22 DEATH — deceased

## 2022-12-02 ENCOUNTER — Ambulatory Visit: Payer: Medicare Other | Admitting: Radiology
